# Patient Record
Sex: Female | Born: 1960
Health system: Southern US, Community
[De-identification: ages and names within clinical notes are randomized; demographics above are authoritative.]

## PROBLEM LIST (undated history)

## (undated) ENCOUNTER — Ambulatory Visit

## (undated) DIAGNOSIS — E079 Disorder of thyroid, unspecified: Secondary | ICD-10-CM

## (undated) DIAGNOSIS — F419 Anxiety disorder, unspecified: Secondary | ICD-10-CM

## (undated) DIAGNOSIS — I10 Essential (primary) hypertension: Secondary | ICD-10-CM

## (undated) DIAGNOSIS — C801 Malignant (primary) neoplasm, unspecified: Secondary | ICD-10-CM

## (undated) DIAGNOSIS — E785 Hyperlipidemia, unspecified: Secondary | ICD-10-CM

## (undated) DIAGNOSIS — E119 Type 2 diabetes mellitus without complications: Secondary | ICD-10-CM

## (undated) DIAGNOSIS — F32A Depression, unspecified: Secondary | ICD-10-CM

## (undated) HISTORY — PX: INCISE AND DRAIN ABCESS: PRO64

## (undated) HISTORY — PX: ABDOMINAL HYSTERECTOMY: SHX81

## (undated) HISTORY — DX: Hyperlipidemia, unspecified: E78.5

## (undated) HISTORY — PX: CHOLECYSTECTOMY: SHX55

## (undated) HISTORY — DX: Depression, unspecified: F32.A

## (undated) HISTORY — DX: Malignant (primary) neoplasm, unspecified: C80.1

## (undated) HISTORY — PX: THYROIDECTOMY: SHX17

## (undated) HISTORY — DX: Anxiety disorder, unspecified: F41.9

## (undated) HISTORY — PX: BREAST CYST EXCISION: SHX579

---

## 1998-08-12 ENCOUNTER — Emergency Department (HOSPITAL_COMMUNITY): Admission: EM | Admit: 1998-08-12 | Discharge: 1998-08-12 | Payer: Self-pay | Admitting: Emergency Medicine

## 1998-10-03 ENCOUNTER — Ambulatory Visit (HOSPITAL_COMMUNITY): Admission: RE | Admit: 1998-10-03 | Discharge: 1998-10-03 | Payer: Self-pay | Admitting: Rehabilitation

## 1999-01-02 ENCOUNTER — Encounter: Payer: Self-pay | Admitting: Emergency Medicine

## 1999-01-02 ENCOUNTER — Emergency Department (HOSPITAL_COMMUNITY): Admission: EM | Admit: 1999-01-02 | Discharge: 1999-01-02 | Payer: Self-pay | Admitting: Emergency Medicine

## 1999-09-07 ENCOUNTER — Other Ambulatory Visit: Admission: RE | Admit: 1999-09-07 | Discharge: 1999-09-07 | Payer: Self-pay | Admitting: *Deleted

## 2000-11-14 ENCOUNTER — Other Ambulatory Visit: Admission: RE | Admit: 2000-11-14 | Discharge: 2000-11-14 | Payer: Self-pay | Admitting: *Deleted

## 2001-01-13 ENCOUNTER — Encounter: Admission: RE | Admit: 2001-01-13 | Discharge: 2001-01-13 | Payer: Self-pay | Admitting: Family Medicine

## 2001-01-13 ENCOUNTER — Encounter: Payer: Self-pay | Admitting: Orthopedic Surgery

## 2001-02-02 ENCOUNTER — Encounter (HOSPITAL_BASED_OUTPATIENT_CLINIC_OR_DEPARTMENT_OTHER): Payer: Self-pay | Admitting: General Surgery

## 2001-02-03 ENCOUNTER — Ambulatory Visit (HOSPITAL_COMMUNITY): Admission: RE | Admit: 2001-02-03 | Discharge: 2001-02-04 | Payer: Self-pay | Admitting: General Surgery

## 2001-02-03 ENCOUNTER — Encounter (HOSPITAL_BASED_OUTPATIENT_CLINIC_OR_DEPARTMENT_OTHER): Payer: Self-pay | Admitting: General Surgery

## 2001-02-03 ENCOUNTER — Encounter (INDEPENDENT_AMBULATORY_CARE_PROVIDER_SITE_OTHER): Payer: Self-pay | Admitting: Specialist

## 2001-03-29 ENCOUNTER — Encounter: Payer: Self-pay | Admitting: Orthopedic Surgery

## 2001-03-29 ENCOUNTER — Encounter: Admission: RE | Admit: 2001-03-29 | Discharge: 2001-03-29 | Payer: Self-pay | Admitting: Orthopedic Surgery

## 2001-05-08 ENCOUNTER — Emergency Department (HOSPITAL_COMMUNITY): Admission: EM | Admit: 2001-05-08 | Discharge: 2001-05-08 | Payer: Self-pay | Admitting: Emergency Medicine

## 2002-02-28 ENCOUNTER — Other Ambulatory Visit: Admission: RE | Admit: 2002-02-28 | Discharge: 2002-02-28 | Payer: Self-pay | Admitting: *Deleted

## 2002-05-01 ENCOUNTER — Encounter: Payer: Self-pay | Admitting: Emergency Medicine

## 2002-05-01 ENCOUNTER — Emergency Department (HOSPITAL_COMMUNITY): Admission: EM | Admit: 2002-05-01 | Discharge: 2002-05-01 | Payer: Self-pay | Admitting: Emergency Medicine

## 2003-03-13 ENCOUNTER — Ambulatory Visit (HOSPITAL_COMMUNITY): Admission: RE | Admit: 2003-03-13 | Discharge: 2003-03-13 | Payer: Self-pay | Admitting: Family Medicine

## 2004-02-03 ENCOUNTER — Other Ambulatory Visit: Admission: RE | Admit: 2004-02-03 | Discharge: 2004-02-03 | Payer: Self-pay | Admitting: Diagnostic Radiology

## 2004-03-02 ENCOUNTER — Encounter (HOSPITAL_COMMUNITY): Admission: RE | Admit: 2004-03-02 | Discharge: 2004-05-31 | Payer: Self-pay | Admitting: General Surgery

## 2004-06-10 ENCOUNTER — Ambulatory Visit (HOSPITAL_COMMUNITY): Admission: RE | Admit: 2004-06-10 | Discharge: 2004-06-10 | Payer: Self-pay | Admitting: General Surgery

## 2004-06-15 ENCOUNTER — Encounter (INDEPENDENT_AMBULATORY_CARE_PROVIDER_SITE_OTHER): Payer: Self-pay | Admitting: *Deleted

## 2004-06-15 ENCOUNTER — Observation Stay (HOSPITAL_COMMUNITY): Admission: RE | Admit: 2004-06-15 | Discharge: 2004-06-16 | Payer: Self-pay | Admitting: General Surgery

## 2004-07-20 ENCOUNTER — Encounter (HOSPITAL_COMMUNITY): Admission: RE | Admit: 2004-07-20 | Discharge: 2004-10-18 | Payer: Self-pay | Admitting: Endocrinology

## 2004-11-26 ENCOUNTER — Emergency Department (HOSPITAL_COMMUNITY): Admission: EM | Admit: 2004-11-26 | Discharge: 2004-11-26 | Payer: Self-pay | Admitting: Emergency Medicine

## 2005-09-08 ENCOUNTER — Encounter: Admission: RE | Admit: 2005-09-08 | Discharge: 2005-09-08 | Payer: Self-pay | Admitting: Endocrinology

## 2006-08-26 ENCOUNTER — Emergency Department (HOSPITAL_COMMUNITY): Admission: EM | Admit: 2006-08-26 | Discharge: 2006-08-26 | Payer: Self-pay | Admitting: Emergency Medicine

## 2007-01-02 ENCOUNTER — Encounter (HOSPITAL_COMMUNITY): Admission: RE | Admit: 2007-01-02 | Discharge: 2007-01-19 | Payer: Self-pay | Admitting: Endocrinology

## 2008-12-27 ENCOUNTER — Emergency Department (HOSPITAL_COMMUNITY): Admission: EM | Admit: 2008-12-27 | Discharge: 2008-12-27 | Payer: Self-pay | Admitting: Emergency Medicine

## 2009-03-28 ENCOUNTER — Emergency Department (HOSPITAL_COMMUNITY): Admission: EM | Admit: 2009-03-28 | Discharge: 2009-03-28 | Payer: Self-pay | Admitting: Family Medicine

## 2010-02-01 ENCOUNTER — Encounter: Payer: Self-pay | Admitting: Endocrinology

## 2010-02-01 ENCOUNTER — Encounter: Payer: Self-pay | Admitting: General Surgery

## 2010-02-01 ENCOUNTER — Encounter: Payer: Self-pay | Admitting: Family Medicine

## 2010-05-29 NOTE — Op Note (Signed)
Cynthia Patton, Cynthia Patton                ACCOUNT NO.:  [id number]   MEDICAL RECORD NO.:  [id number]          PATIENT TYPE:  AMB   LOCATION:  DAY                          FACILITY:  [facility]   PHYSICIAN:  Angelia Mould. Derrell Lolling, M.D.DATE OF BIRTH:  05-11-1960   DATE OF PROCEDURE:  06/15/2004  DATE OF DISCHARGE:                                 OPERATIVE REPORT   PREOPERATIVE DIAGNOSES:  1.  Primary hyperparathyroidism.  2.  Right thyroid nodule.   POSTOPERATIVE DIAGNOSES:  1.  Primary hyperparathyroidism (adenoma, left inferior).  2.  Papillary carcinoma of the thyroid gland.   OPERATION PERFORMED:  Total thyroidectomy with frozen section, resection of  parathyroid adenoma disease, left inferior position).   SURGEON:  Angelia Mould. Derrell Lolling, M.D.   FIRST ASSISTANT:  Velora Heckler, M.D.   OPERATIVE INDICATIONS:  This is a 50 year old black female who has a  multinodular goiter and has been evaluated for the goiter as well as  hypercalcemia. She has had calcium levels as high as 11.7 and parathormone  levels as high as 178, but this has been fluctuating depending on vitamin D  therapy. A sestamibi scan x2 has been non-localizing. A thyroid ultrasound  shows a 1.4-cm nodule in the right lobe and suggests a parathyroid adenoma 8  mm in size in the left inferior position. Fine needle aspiration cytology of  the right thyroid nodule showed atypical features, and excision was  recommended. On exam, she is obese with a short, thick neck. No palpable  mass or adenopathy is noted. She has been counseled regarding the surgery  and its risks as an outpatient. She is brought to the operating room  electively.   OPERATIVE FINDINGS:  The patient had about an 8-9 mm parathyroid adenoma in  the left inferior position. This was relatively anterior and just below the  level of the inferior thyroid artery. She had a small hard nodule in the  right thyroid lobe just to the right of the isthmus, and on frozen  section,  this proved to be a papillary carcinoma. There was no adenopathy in the  neck. There was a small fatty nodule in the suprasternal notch which was  sent as a separate specimen. The recurrent laryngeal nerves were seen on  both sides and preserved. We got a good look at the superior parathyroid on  the left. The inferior parathyroid on the left was removed as the adenoma.  On the right side, there looked like there may have been a tiny parathyroid  which was preserved and left with the superior thyroid vessels. There was  some fatty tissue near the inferior thyroid artery on the right, but I never  completely confirmed that we preserved the right inferior parathyroid. There  was no adenopathy otherwise.   OPERATIVE TECHNIQUE:  Following the induction of general endotracheal  anesthesia, the patient was positioned with her arms at her side and her  neck extended with a roll behind her shoulders. The neck was prepped and  draped in a sterile fashion. The patient was identified. A curved transverse  incision was made about  2 cm above the suprasternal notch. Dissection was  carried down through the platysma muscle with electrocautery. Skin and  platysma flaps were raised superiorly and inferiorly and a self-retaining  retractor placed. Strap muscles were divided in the midline and using blunt  dissection, sharp dissection, and electrocautery, the strap muscles were  dissected off the thyroid gland on each side.   We noted the palpable nodule in the right thyroid gland that was about 1-1.5  cm in diameter relatively anteriorly. I did not feel any nodules in the left  thyroid gland. We identified what appeared to be a parathyroid adenoma on  the left inferior position. This was just below and slightly anterior to the  inferior thyroid artery. This had a nice dissection plane, and it was  dissected off the thyroid gland without any difficulty. This tissue was sent  to the lab and Dr.  Debby Bud performed frozen section and confirmed that this  was parathyroid tissue. We felt that this was consistent with her  parathyroid adenoma.   We then performed a right thyroid lobectomy. The superior thyroid vessels  were isolated, ligated in continuity with 2-0 silk ties, and a metal clip  placed superiorly for security, and then divided. We mobilized the inferior  pole on the right. A few vascular channels were controlled. We then  identified the recurrent laryngeal nerve on the right and kept the  dissection in the capsule of the thyroid gland staying well away from the  recurrent laryngeal nerve, mobilizing the thyroid gland. The inferior  thyroid artery was isolated, secured with small metal clips, and divided.  The middle thyroid vein was likewise secured with multiple metal clips and  divided. We were then able to mobilize the thyroid gland up and off the  anterior surface of the trachea and all the way over to the left side of the  isthmus. The isthmus was clamped, and the tissues were divided. The isthmus  was tied off with a Vicryl tie.   Frozen section returned showing papillary carcinoma. Given the size of the  tumor, we felt that it would be appropriate  to do a completion  thyroidectomy. We then performed a left thyroid lobectomy. We isolated the  superior thyroid vessels on the left. Parathyroid tissue left was identified  and was dissected off and preserved. The superior thyroid vessels were  ligated in continuity with 2-0 silk ties, and a metal clip was placed above  the superior tie for security. We mobilized the upper pole further by  dissecting it off the airway. The inferior pole of the thyroid was mobilized  by dividing a couple of venous channels. We had good visualization of the  recurrent laryngeal nerve on the left, and it was easy to preserve. The middle thyroid vein and the inferior thyroid artery were isolated, secured  with small metal clips, and  divided. We then were able to dissect the  thyroid gland off with sharp dissection being careful to avoid any cautery  near the nerve. The left thyroid lobe was then completely dissected off the  airway and sent for routine histology.   The entire wound was irrigated with saline. Hemostasis was excellent. Some  Surgicel gauze were placed in the thyroid bed on each side. Then, we waited  about 5-10 minutes and after that period of time, the wound was completely  dry. The strap muscles were closed in the midline with interrupted sutures  of 3-0 Vicryl. The platysma muscle was closed with  interrupted sutures of 3-  0 Vicryl. The skin was closed with some skin staples and Steri-  Strips in between. A clean bandage was placed and the patient taken to the  recovery room in stable condition. Estimated blood loss was about 30-40 cc.  Complications:  None.  Sponge, needle, and instrument counts were correct.       HMI/MEDQ  D:  06/15/2004  T:  06/15/2004  Job:  [id number]   cc:   Dorisann Frames, M.D.  [place] [address], [place] [address]  Fax: [phone]   Holley Bouche, M.D.  [address]  [place], [place] [address]  Fax: [phone]

## 2010-05-29 NOTE — Op Note (Signed)
[facility]  Patient:    LIBBEY, DUCE Visit Number: [id number] MRN: [id number]          Service Type: DSU Location: [id number] Attending Physician:  Sonda Primes Dictated by:   Mardene Celeste. Lurene Shadow, M.D. Proc. 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  PROCEDURE:  Laparoscopic cholecystectomy with intraoperative cholangiogram.  SURGEON:  Luisa Hart L. Lurene Shadow, M.D.  ASSISTANT:  Marnee Spring. Wiliam Ke, M.D.  ANESTHESIA:  General.  INDICATIONS:  The patient is a 50 year old woman presenting with a calcified mass in the right upper quadrant on routine hip x-rays.  Subsequent ultrasound demonstrates what is felt to be a porcelain gallbladder.  She is brought to the operating room now for a laparoscopic cholecystectomy.  DESCRIPTION OF PROCEDURE:  Following the induction of satisfactory anesthesia with the patient positioned supinely, the abdomen was routinely prepped and draped to be included in the sterile operative field.  Open laparoscopy was created at the umbilicus and insertion of a Hasson cannula, and insufflation of the peritoneal cavity to 14 mmHg pressure.  The camera was inserted and the abdomen explored.  The gallbladder does seem to be partially calcified, but it is relatively mobile.  The liver edges were sharp and liver surfaces smooth. The anterior gastric wall, duodenum, and ________ normal.  None of the small or large intestine appeared to be abnormal.  Multiple adhesions in the lower abdomen from previous surgery.  Under direct vision, epigastric and lateral ports were placed.  The gallbladder was grasped and retracted cephalad.  The dissection was carried down  and the region of the ampulla of the gallbladder and along the ______ duodenal ligament with isolation of the cystic artery and cystic duct.  The cystic duct is doubly clipped and opened, and a cystic duct cholangiogram carried out with one-half strength Hypaque injected into the cystic duct through a Reddick catheter which was inserted into the abdomen through a 14-gauge angiocath.  The resultant cholangiogram showed free flow of contrast into the duodenum.  Upper and lower radicles appeared to be normal.  No filling defects are noted.  The cholangiocatheter was removed.  The cystic duct is doubly clipped and then transected, and the cystic artery doubly clipped and transected.  The gallbladder dissected free from the liver bed, maintaining hemostasis with electrocautery throughout the course of the dissection.  The gallbladder was then removed from the abdomen using an Endocatch and the right upper quadrant thoroughly irrigated with normal saline and aspirated.  Sponge, instrument, and sharp counts were verified.  The trocars were removed under direct vision and pneumoperitoneum allowed to deflate.  The wounds were then closed in layers as follows; the umbilical wound in two layers with 0 Dexon and 4-0 Dexon.  Epigastric and lateral flank wounds were closed with 4-0 Dexon sutures and reinforced with Steri-Strips and sterile dressings applied.  The anesthetic was reversed.  The patient was removed from the operating room to the recovery room in stable condition.  She tolerated the procedure well. Dictated by:   Mardene Celeste. Lurene Shadow, M.D. Attending Physician:  Sonda Primes DD:  02/03/01 TD:  02/04/01 Job: [id number]

## 2010-07-09 ENCOUNTER — Emergency Department (HOSPITAL_COMMUNITY): Payer: Self-pay

## 2010-07-09 ENCOUNTER — Emergency Department (HOSPITAL_COMMUNITY)
Admission: EM | Admit: 2010-07-09 | Discharge: 2010-07-09 | Disposition: A | Discharge status: Home / Self Care | Payer: Self-pay | Attending: Emergency Medicine | Admitting: Emergency Medicine

## 2010-07-09 DIAGNOSIS — M79609 Pain in unspecified limb: Secondary | ICD-10-CM | POA: Insufficient documentation

## 2010-07-09 DIAGNOSIS — R5383 Other fatigue: Secondary | ICD-10-CM | POA: Insufficient documentation

## 2010-07-09 DIAGNOSIS — I10 Essential (primary) hypertension: Secondary | ICD-10-CM | POA: Insufficient documentation

## 2010-07-09 DIAGNOSIS — R079 Chest pain, unspecified: Secondary | ICD-10-CM | POA: Insufficient documentation

## 2010-07-09 DIAGNOSIS — M810 Age-related osteoporosis without current pathological fracture: Secondary | ICD-10-CM | POA: Insufficient documentation

## 2010-07-09 DIAGNOSIS — Z79899 Other long term (current) drug therapy: Secondary | ICD-10-CM | POA: Insufficient documentation

## 2010-07-09 DIAGNOSIS — R0609 Other forms of dyspnea: Secondary | ICD-10-CM | POA: Insufficient documentation

## 2010-07-09 DIAGNOSIS — R110 Nausea: Secondary | ICD-10-CM | POA: Insufficient documentation

## 2010-07-09 DIAGNOSIS — Z85850 Personal history of malignant neoplasm of thyroid: Secondary | ICD-10-CM | POA: Insufficient documentation

## 2010-07-09 DIAGNOSIS — R5381 Other malaise: Secondary | ICD-10-CM | POA: Insufficient documentation

## 2010-07-09 DIAGNOSIS — R0989 Other specified symptoms and signs involving the circulatory and respiratory systems: Secondary | ICD-10-CM | POA: Insufficient documentation

## 2010-07-09 DIAGNOSIS — R42 Dizziness and giddiness: Secondary | ICD-10-CM | POA: Insufficient documentation

## 2010-07-09 LAB — CBC
HCT: 39 % (ref 36.0–46.0)
MCHC: 33.3 g/dL (ref 30.0–36.0)
RDW: 12.7 % (ref 11.5–15.5)

## 2010-07-09 LAB — CK TOTAL AND CKMB (NOT AT ARMC): Total CK: 199 U/L — ABNORMAL HIGH (ref 7–177)

## 2010-07-09 LAB — GLUCOSE, CAPILLARY: Glucose-Capillary: 112 mg/dL — ABNORMAL HIGH (ref 70–99)

## 2010-07-09 LAB — URINALYSIS, ROUTINE W REFLEX MICROSCOPIC
Glucose, UA: NEGATIVE mg/dL
Protein, ur: NEGATIVE mg/dL
Specific Gravity, Urine: 1.016 (ref 1.005–1.030)
pH: 5.5 (ref 5.0–8.0)

## 2010-07-09 LAB — DIFFERENTIAL
Basophils Absolute: 0 10*3/uL (ref 0.0–0.1)
Basophils Relative: 0 % (ref 0–1)
Eosinophils Relative: 2 % (ref 0–5)
Monocytes Absolute: 0.4 10*3/uL (ref 0.1–1.0)

## 2010-07-09 LAB — D-DIMER, QUANTITATIVE: D-Dimer, Quant: 0.47 ug/mL-FEU (ref 0.00–0.48)

## 2010-07-09 LAB — BASIC METABOLIC PANEL
CO2: 28 mEq/L (ref 19–32)
Calcium: 8.4 mg/dL (ref 8.4–10.5)
Chloride: 104 mEq/L (ref 96–112)
Glucose, Bld: 112 mg/dL — ABNORMAL HIGH (ref 70–99)
Sodium: 141 mEq/L (ref 135–145)

## 2010-07-09 LAB — URINE MICROSCOPIC-ADD ON

## 2010-10-23 LAB — BASIC METABOLIC PANEL
BUN: 9
CO2: 28
Chloride: 104
Creatinine, Ser: 1.26 — ABNORMAL HIGH
Glucose, Bld: 148 — ABNORMAL HIGH
Potassium: 3.6

## 2010-10-23 LAB — URINALYSIS, ROUTINE W REFLEX MICROSCOPIC
Bilirubin Urine: NEGATIVE
Ketones, ur: NEGATIVE
Leukocytes, UA: NEGATIVE
Nitrite: NEGATIVE
Urobilinogen, UA: 0.2

## 2010-10-23 LAB — TSH: TSH: 115.687 — ABNORMAL HIGH

## 2010-10-23 LAB — POCT CARDIAC MARKERS: Troponin i, poc: <0.05

## 2011-03-28 ENCOUNTER — Emergency Department (HOSPITAL_COMMUNITY)
Admission: EM | Admit: 2011-03-28 | Discharge: 2011-03-28 | Disposition: A | Discharge status: Home / Self Care | Payer: Self-pay | Attending: Emergency Medicine | Admitting: Emergency Medicine

## 2011-03-28 ENCOUNTER — Emergency Department (HOSPITAL_COMMUNITY): Payer: Self-pay

## 2011-03-28 ENCOUNTER — Encounter (HOSPITAL_COMMUNITY): Payer: Self-pay

## 2011-03-28 DIAGNOSIS — R05 Cough: Secondary | ICD-10-CM | POA: Insufficient documentation

## 2011-03-28 DIAGNOSIS — R911 Solitary pulmonary nodule: Secondary | ICD-10-CM | POA: Insufficient documentation

## 2011-03-28 DIAGNOSIS — R059 Cough, unspecified: Secondary | ICD-10-CM | POA: Insufficient documentation

## 2011-03-28 DIAGNOSIS — F17200 Nicotine dependence, unspecified, uncomplicated: Secondary | ICD-10-CM | POA: Insufficient documentation

## 2011-03-28 DIAGNOSIS — R0789 Other chest pain: Secondary | ICD-10-CM | POA: Insufficient documentation

## 2011-03-28 DIAGNOSIS — J40 Bronchitis, not specified as acute or chronic: Secondary | ICD-10-CM | POA: Insufficient documentation

## 2011-03-28 DIAGNOSIS — I10 Essential (primary) hypertension: Secondary | ICD-10-CM | POA: Insufficient documentation

## 2011-03-28 DIAGNOSIS — E079 Disorder of thyroid, unspecified: Secondary | ICD-10-CM | POA: Insufficient documentation

## 2011-03-28 HISTORY — DX: Essential (primary) hypertension: I10

## 2011-03-28 HISTORY — DX: Disorder of thyroid, unspecified: E07.9

## 2011-03-28 MED ORDER — OXYCODONE-ACETAMINOPHEN 5-325 MG PO TABS
1.0000 | ORAL_TABLET | Freq: Once | ORAL | Status: AC
  Administered 2011-03-28: 1 via ORAL
  Filled 2011-03-28: qty 1

## 2011-03-28 MED ORDER — OXYCODONE-ACETAMINOPHEN 5-325 MG PO TABS: 2.0000 | ORAL_TABLET | ORAL | Status: AC | PRN

## 2011-03-28 MED ORDER — BENZONATATE 100 MG PO CAPS: 100.0000 mg | ORAL_CAPSULE | Freq: Three times a day (TID) | ORAL | Status: AC

## 2011-03-28 NOTE — ED Notes (Signed)
Pt became sick in September, and feels that she has never gotten better.  Today it has worsened. Pt complains of chest pain and cough. Cough is productive with yellow-green sputum.  Pt felt out of breath earlier. Pt has generalized weakness, chills.

## 2011-03-28 NOTE — ED Provider Notes (Addendum)
History     CSN: [id number]  Arrival date & time 03/28/11  1207   First MD Initiated Contact with Patient 03/28/11 1432      Chief Complaint  Patient presents with  . Chest Pain  . Cough    (Consider location/radiation/quality/duration/timing/severity/associated sxs/prior treatment) Patient is a 51 y.o. female presenting with chest pain and cough. The history is provided by the patient.  Chest Pain Primary symptoms include a fever and cough. Pertinent negatives for primary symptoms include no shortness of breath, no palpitations, no abdominal pain, no nausea and no vomiting.    Cough Associated symptoms include chest pain. Pertinent negatives include no chills, no headaches and no shortness of breath.   the patient is a 51 year old, female, with hypertension.  She smokes cigarettes.  She complains of a nonproductive cough since last September.  She does not have shortness of breath ,fevers, chills, nausea, vomiting, sweating, pain or swelling.  Today.  She was at church singing, and she developed tightness in her chest that lasted about 3 minutes and then went away.  Now.  She has soreness in her chest.  She says.  She has not fallen recently.  She states when she goes up and down stairs.  She does not get chest pain.  She denies diabetes, prior heart disease or family history of heart disease.  Presently, she says she has soreness in her chest without radiation.  She did not take anything for her pain  Past Medical History  Diagnosis Date  . Hypertension   . Thyroid disease     Past Surgical History  Procedure Date  . Cholecystectomy   . Abdominal hysterectomy     No family history on file.  History  Substance Use Topics  . Smoking status: Not on file  . Smokeless tobacco: Not on file  . Alcohol Use: No    OB History    Grav Para Term Preterm Abortions TAB SAB Ect Mult Living                  Review of Systems  Constitutional: Positive for fever. Negative for  chills.  Respiratory: Positive for cough and chest tightness. Negative for shortness of breath.        Chest tightness lasted 3 minutes while she was singing and has resolved.  It was not associated with any other symptoms  Cardiovascular: Positive for chest pain. Negative for palpitations and leg swelling.       3 minutes of chest tightness while singing resolved now.  She says that her chest is sore now  Gastrointestinal: Negative for nausea, vomiting and abdominal pain.  Neurological: Negative for headaches.  Psychiatric/Behavioral: Negative for confusion.  All other systems reviewed and are negative.    Allergies  Floxin  Home Medications   Current Outpatient Rx  Name Route Sig Dispense Refill  . CETIRIZINE HCL 10 MG PO TABS Oral Take 10 mg by mouth daily.    Marland Kitchen HYDROCHLOROTHIAZIDE 12.5 MG PO TABS Oral Take 12.5 mg by mouth daily.    Marland Kitchen LEVOTHYROXINE SODIUM 200 MCG PO TABS Oral Take 200 mcg by mouth daily.    Marland Kitchen BENZONATATE 100 MG PO CAPS Oral Take 1 capsule (100 mg total) by mouth every 8 (eight) hours. 21 capsule 0  . OXYCODONE-ACETAMINOPHEN 5-325 MG PO TABS Oral Take 2 tablets by mouth every 4 (four) hours as needed for pain. 6 tablet 0    BP 168/94  Pulse 97  Temp(Src) 98.5 F (  36.9 C) (Oral)  Resp 22  SpO2 97%  Physical Exam  Vitals reviewed. Constitutional: She is oriented to person, place, and time. No distress.       Obese  HENT:  Head: Normocephalic and atraumatic.  Eyes: Pupils are equal, round, and reactive to light.  Neck: Normal range of motion.  Cardiovascular: Normal rate, regular rhythm and normal heart sounds.   No murmur heard. Pulmonary/Chest: Effort normal and breath sounds normal. No respiratory distress. She has no wheezes. She has no rales. She exhibits tenderness.       Left parasternal chest wall tenderness  Abdominal: Soft. She exhibits no distension and no mass. There is no tenderness. There is no rebound and no guarding.  Musculoskeletal:  Normal range of motion. She exhibits no edema and no tenderness.  Neurological: She is alert and oriented to person, place, and time. No cranial nerve deficit.  Skin: Skin is warm and dry. No rash noted. No erythema.  Psychiatric: She has a normal mood and affect. Her behavior is normal.    ED Course  Procedures (including critical care time) 51 year old, female, with chronic cough since last September presents to emergency department with chest wall soreness.  She denies shortness of breath, fevers, chills, nausea, vomiting, or sweating.  She has left parasternal tenderness, but her lungs are clear and she is in no distress.  We will perform a chest x-ray.  Given the fact that she had 3 minutes of chest tightness while singing and she does not get chest pain or ACS.  Symptoms.  When she goes upstairs.  I do not think she is having in acute coronary syndrome.  Labs Reviewed - No data to display Dg Chest 2 View  03/28/2011  *RADIOLOGY REPORT*  Clinical Data: Chest pain, cough  CHEST - 2 VIEW  Comparison: 07/09/2010  Findings: Stable borderline cardiomegaly but normal vascularity. Negative for pneumonia, collapse, consolidation, edema, effusion or pneumothorax.  Possible tiny punctate right mid lung granuloma versus prominent vascular marking.  On the lateral view, there is a 3 cm round opacity projecting over the posterior aspect of the heart.  By comparison, this appears slightly enlarged.  Adenopathy or nodule not excluded.  Recommend further evaluation with non emergent chest CT with contrast.  IMPRESSION: No acute chest process.  3 cm nodular opacity on the lateral view as described, indeterminate.  Please see above comment and recommendation.  Original Report Authenticated By: Judie Petit. Ruel Favors, M.D.     1. Bronchitis       MDM  Bronchitis with left parasternal tenderness No pneumonia.  No signs of respiratory distress or hypoxia 3 cm left hemithorax nodule.  The patient is a smoker.  I  advised her of the results of her chest x-ray, and the need for followup.  CAT scan for further evaluation of the nodule.  She understands these instructions.        Cheri Guppy, MD 03/28/11 1518  Cheri Guppy, MD 03/28/11 [phone]

## 2011-03-28 NOTE — Discharge Instructions (Signed)
Your chest x-ray does not show pneumonia.  However, there is a 3 cm nodule on the left side of your chest.  This is unlikely to be the cause of your pain.  However, it should be reevaluated with a CAT scan.  Stop smoking.  Cigarettes.  Use Tessalon for cough, and ibuprofen 600 mg every 6 hours for pain.  Percocet for more severe pain.  Followup in 3 months to schedule a CAT scan for evaluation of the nodule in your lungs.  Return for worse or uncontrolled symptoms

## 2011-03-28 NOTE — ED Notes (Signed)
[id number]<GN> Expected date:03/28/11<BR> Expected time:<BR> Means of arrival:<BR> Comments:<BR> EMS 100 GC - sob/chestwall pain/cough

## 2011-04-30 ENCOUNTER — Emergency Department (HOSPITAL_COMMUNITY)
Admission: EM | Admit: 2011-04-30 | Discharge: 2011-04-30 | Disposition: A | Discharge status: Home / Self Care | Payer: Self-pay | Attending: Emergency Medicine | Admitting: Emergency Medicine

## 2011-04-30 ENCOUNTER — Emergency Department (HOSPITAL_COMMUNITY): Payer: Self-pay

## 2011-04-30 ENCOUNTER — Encounter (HOSPITAL_COMMUNITY): Payer: Self-pay | Admitting: Physical Medicine and Rehabilitation

## 2011-04-30 DIAGNOSIS — E079 Disorder of thyroid, unspecified: Secondary | ICD-10-CM | POA: Insufficient documentation

## 2011-04-30 DIAGNOSIS — J40 Bronchitis, not specified as acute or chronic: Secondary | ICD-10-CM | POA: Insufficient documentation

## 2011-04-30 DIAGNOSIS — F17200 Nicotine dependence, unspecified, uncomplicated: Secondary | ICD-10-CM | POA: Insufficient documentation

## 2011-04-30 DIAGNOSIS — I10 Essential (primary) hypertension: Secondary | ICD-10-CM | POA: Insufficient documentation

## 2011-04-30 DIAGNOSIS — R079 Chest pain, unspecified: Secondary | ICD-10-CM | POA: Insufficient documentation

## 2011-04-30 LAB — BASIC METABOLIC PANEL
CO2: 28 mEq/L (ref 19–32)
Chloride: 100 mEq/L (ref 96–112)
GFR calc non Af Amer: 72 mL/min — ABNORMAL LOW (ref >90)
Glucose, Bld: 159 mg/dL — ABNORMAL HIGH (ref 70–99)
Potassium: 3.6 mEq/L (ref 3.5–5.1)
Sodium: 138 mEq/L (ref 135–145)

## 2011-04-30 LAB — DIFFERENTIAL
Basophils Absolute: 0 10*3/uL (ref 0.0–0.1)
Lymphocytes Relative: 35 % (ref 12–46)
Lymphs Abs: 2.8 10*3/uL (ref 0.7–4.0)
Monocytes Absolute: 0.1 10*3/uL (ref 0.1–1.0)
Neutro Abs: 4.8 10*3/uL (ref 1.7–7.7)

## 2011-04-30 LAB — CBC
Hemoglobin: 14 g/dL (ref 12.0–15.0)
MCHC: 32.5 g/dL (ref 30.0–36.0)
RBC: 4.54 MIL/uL (ref 3.87–5.11)
WBC: 7.7 10*3/uL (ref 4.0–10.5)

## 2011-04-30 LAB — TROPONIN I: Troponin I: <0.3 ng/mL (ref <0.30)

## 2011-04-30 LAB — POCT I-STAT TROPONIN I

## 2011-04-30 MED ORDER — ALBUTEROL SULFATE HFA 108 (90 BASE) MCG/ACT IN AERS
2.0000 | INHALATION_SPRAY | Freq: Four times a day (QID) | RESPIRATORY_TRACT | Status: DC
  Administered 2011-04-30 (×2): 2 via RESPIRATORY_TRACT
  Filled 2011-04-30: qty 6.7

## 2011-04-30 MED ORDER — KETOROLAC TROMETHAMINE 30 MG/ML IJ SOLN
30.0000 mg | Freq: Once | INTRAMUSCULAR | Status: AC
  Administered 2011-04-30: 30 mg via INTRAVENOUS
  Filled 2011-04-30: qty 1

## 2011-04-30 MED ORDER — LEVOTHYROXINE SODIUM 200 MCG PO TABS: 200.0000 ug | ORAL_TABLET | Freq: Every day | ORAL | Status: DC

## 2011-04-30 MED ORDER — AEROCHAMBER PLUS W/MASK MISC
Status: AC
  Administered 2011-04-30: 13:00:00
  Filled 2011-04-30: qty 1

## 2011-04-30 MED ORDER — IBUPROFEN 800 MG PO TABS: 800.0000 mg | ORAL_TABLET | Freq: Three times a day (TID) | ORAL | Status: AC

## 2011-04-30 NOTE — ED Notes (Signed)
Pt presents to department for evaluation of midsternal chest pain radiating to R arm and neck. Ongoing x1 month. Pt states 8/10 pain upon arrival, becomes worse when lying flat. Also states SOB on exertion. She is alert and oriented x4. Respirations unlabored. Speaking complete sentences. Skin warm and dry.

## 2011-04-30 NOTE — ED Provider Notes (Signed)
History     CSN: [id number]  Arrival date & time 04/30/11  1021   First MD Initiated Contact with Patient 04/30/11 1034      Chief Complaint  Patient presents with  . Chest Pain  . Shortness of Breath    (Consider location/radiation/quality/duration/timing/severity/associated sxs/prior treatment) HPI The patient presents with chest pain, cough and occasional dyspnea.  She notes her symptoms began months ago.  After her symptoms were present for some time, she presented to our affiliated hospital.  She was evaluated with chest x-ray, ECG, diagnosed with bronchitis and discharged to follow up with the primary care physician.  She has not done so thus far.  Patient presents with concerns over the persistency of her chest pain.  The pain is described as substernal with radiation around the left inframammary area.  The pain is not positional, exertional, or pleuritic.  She notes that she also continues to have mild cough.  She denies significant dyspnea, but does note that she has decreased exercise tolerance.  relief with anything.  The pain is worse with supine positioning.  No lower extremity edema. The patient was scheduled to follow up at outpatient clinic today, in 2 hours.  Although her pain has been present for months, she became anxious at the wait and presents here for evaluation. Past Medical History  Diagnosis Date  . Hypertension   . Thyroid disease     Past Surgical History  Procedure Date  . Cholecystectomy   . Abdominal hysterectomy     No family history on file.  History  Substance Use Topics  . Smoking status: Current Everyday Smoker -- 0.5 packs/day    Types: Cigarettes  . Smokeless tobacco: Not on file  . Alcohol Use: No    OB History    Grav Para Term Preterm Abortions TAB SAB Ect Mult Living                  Review of Systems  Constitutional:       HPI  HENT:       HPI otherwise negative  Eyes: Negative.   Respiratory:       HPI, otherwise  negative  Cardiovascular:       HPI, otherwise nmegative  Gastrointestinal: Negative for vomiting.  Genitourinary:       HPI, otherwise negative  Musculoskeletal:       HPI, otherwise negative  Skin: Negative.   Neurological: Negative for syncope.    Allergies  Floxin  Home Medications   Current Outpatient Rx  Name Route Sig Dispense Refill  . FEXOFENADINE HCL 180 MG PO TABS Oral Take 180 mg by mouth daily.    Marland Kitchen HYDROCHLOROTHIAZIDE 12.5 MG PO TABS Oral Take 12.5 mg by mouth daily.    Marland Kitchen LEVOTHYROXINE SODIUM 200 MCG PO TABS Oral Take 200 mcg by mouth daily.      BP 183/87  Pulse 87  Temp(Src) 98.2 F (36.8 C) (Oral)  Resp 18  SpO2 100%  Physical Exam  Nursing note and vitals reviewed. Constitutional: She is oriented to person, place, and time. She appears well-developed and well-nourished. No distress.  HENT:  Head: Normocephalic and atraumatic.  Eyes: Conjunctivae and EOM are normal.  Cardiovascular: Normal rate and regular rhythm.   Pulmonary/Chest: Effort normal and breath sounds normal. No stridor. No respiratory distress.  Abdominal: She exhibits no distension.  Musculoskeletal: She exhibits no edema and no tenderness.  Neurological: She is alert and oriented to person, place, and time. No  cranial nerve deficit.  Skin: Skin is warm and dry.  Psychiatric: She has a normal mood and affect.    ED Course  Procedures (including critical care time)   Labs Reviewed  CBC  BASIC METABOLIC PANEL  DIFFERENTIAL  TROPONIN I   No results found.   No diagnosis found.   Cardiac monitor 91 sinus rhythm normal Pulse oximetry 99% room air normal    Date: 04/30/2011  Rate: 90  Rhythm: normal sinus rhythm  QRS Axis: normal  Intervals: normal  ST/T Wave abnormalities: normal  Conduction Disutrbances: none  Narrative Interpretation: unremarkable  I have reviewed the chest x-ray  12:35 PM Patient sitting upright smiling, speaking clearly MDM  This  well-appearing female presents with months of chest pain, mild cough.  On exam the patient is in no distress.  I spoke with her at length about the need to stop smoking.  Given the chronicity of her pain from her nonischemic EKG to the unremarkable chest x-ray and labs, the patient's discomfort is most likely due to musculoskeletal or bronchitic etiology.  Discussed with patient.  She will be discharged with an albuterol inhaler, anti-inflammatories, instruction to return to clinic for followup.  The patient's request thyroid medication.  This was provided for one week.     Gerhard Munch, MD 04/30/11 1236

## 2011-04-30 NOTE — Progress Notes (Signed)
Met with patient to discuss her health care practice. She denies a PCP and health insurance, living with her mother and lost her job 3 years ago. 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 Agreed to participate with [facility] program and stated will contact the community liaison. Patient provided with the [employer] packet.

## 2011-04-30 NOTE — Discharge Instructions (Signed)
Bronchitis Bronchitis is the body's way of reacting to injury and/or infection (inflammation) of the bronchi. Bronchi are the air tubes that extend from the windpipe into the lungs. If the inflammation becomes severe, it may cause shortness of breath. CAUSES  Inflammation may be caused by:  A virus.   Germs (bacteria).   Dust.   Allergens.   Pollutants and many other irritants.  The cells lining the bronchial tree are covered with tiny hairs (cilia). These constantly beat upward, away from the lungs, toward the mouth. This keeps the lungs free of pollutants. When these cells become too irritated and are unable to do their job, mucus begins to develop. This causes the characteristic cough of bronchitis. The cough clears the lungs when the cilia are unable to do their job. Without either of these protective mechanisms, the mucus would settle in the lungs. Then you would develop pneumonia. Smoking is a common cause of bronchitis and can contribute to pneumonia. Stopping this habit is the single most important thing you can do to help yourself. TREATMENT   Your caregiver may prescribe an antibiotic if the cough is caused by bacteria. Also, medicines that open up your airways make it easier to breathe. Your caregiver may also recommend or prescribe an expectorant. It will loosen the mucus to be coughed up. Only take over-the-counter or prescription medicines for pain, discomfort, or fever as directed by your caregiver.   Removing whatever causes the problem (smoking, for example) is critical to preventing the problem from getting worse.   Cough suppressants may be prescribed for relief of cough symptoms.   Inhaled medicines may be prescribed to help with symptoms now and to help prevent problems from returning.   For those with recurrent (chronic) bronchitis, there may be a need for steroid medicines.  SEEK IMMEDIATE MEDICAL CARE IF:   During treatment, you develop more pus-like mucus  (purulent sputum).   You have a fever.   Your baby is older than 3 months with a rectal temperature of 102 F (38.9 C) or higher.   Your baby is 3 months old or younger with a rectal temperature of 100.4 F (38 C) or higher.   You become progressively more ill.   You have increased difficulty breathing, wheezing, or shortness of breath.  It is necessary to seek immediate medical care if you are elderly or sick from any other disease. MAKE SURE YOU:   Understand these instructions.   Will watch your condition.   Will get help right away if you are not doing well or get worse.  Document Released: 12/28/2004 Document Revised: 12/17/2010 Document Reviewed: 11/07/2007 [employer] Patient Information 2012 [employer].Chest Pain (Nonspecific) It is often hard to give a specific diagnosis for the cause of chest pain. There is always a chance that your pain could be related to something serious, such as a heart attack or a blood clot in the lungs. You need to follow up with your caregiver for further evaluation. CAUSES   Heartburn.   Pneumonia or bronchitis.   Anxiety or stress.   Inflammation around your heart (pericarditis) or lung (pleuritis or pleurisy).   A blood clot in the lung.   A collapsed lung (pneumothorax). It can develop suddenly on its own (spontaneous pneumothorax) or from injury (trauma) to the chest.   Shingles infection (herpes zoster virus).  The chest wall is composed of bones, muscles, and cartilage. Any of these can be the source of the pain.  The bones can   be bruised by injury.   The muscles or cartilage can be strained by coughing or overwork.   The cartilage can be affected by inflammation and become sore (costochondritis).  DIAGNOSIS  Lab tests or other studies, such as X-rays, electrocardiography, stress testing, or cardiac imaging, may be needed to find the cause of your pain.  TREATMENT   Treatment depends on what may be causing your chest pain.  Treatment may include:   Acid blockers for heartburn.   Anti-inflammatory medicine.   Pain medicine for inflammatory conditions.   Antibiotics if an infection is present.   You may be advised to change lifestyle habits. This includes stopping smoking and avoiding alcohol, caffeine, and chocolate.   You may be advised to keep your head raised (elevated) when sleeping. This reduces the chance of acid going backward from your stomach into your esophagus.   Most of the time, nonspecific chest pain will improve within 2 to 3 days with rest and mild pain medicine.  HOME CARE INSTRUCTIONS   If antibiotics were prescribed, take your antibiotics as directed. Finish them even if you start to feel better.   For the next few days, avoid physical activities that bring on chest pain. Continue physical activities as directed.   Do not smoke.   Avoid drinking alcohol.   Only take over-the-counter or prescription medicine for pain, discomfort, or fever as directed by your caregiver.   Follow your caregiver's suggestions for further testing if your chest pain does not go away.   Keep any follow-up appointments you made. If you do not go to an appointment, you could develop lasting (chronic) problems with pain. If there is any problem keeping an appointment, you must call to reschedule.  SEEK MEDICAL CARE IF:   You think you are having problems from the medicine you are taking. Read your medicine instructions carefully.   Your chest pain does not go away, even after treatment.   You develop a rash with blisters on your chest.  SEEK IMMEDIATE MEDICAL CARE IF:   You have increased chest pain or pain that spreads to your arm, neck, jaw, back, or abdomen.   You develop shortness of breath, an increasing cough, or you are coughing up blood.   You have severe back or abdominal pain, feel nauseous, or vomit.   You develop severe weakness, fainting, or chills.   You have a fever.  THIS IS AN  EMERGENCY. Do not wait to see if the pain will go away. Get medical help at once. Call your local emergency services (911 in [place] Do not drive yourself to the hospital. MAKE SURE YOU:   Understand these instructions.   Will watch your condition.   Will get help right away if you are not doing well or get worse.  Document Released: 10/07/2004 Document Revised: 12/17/2010 Document Reviewed: 08/03/2007 [employer] Patient Information 2012 [employer]. 

## 2011-04-30 NOTE — ED Notes (Signed)
Pt c/o pain in right side of neck radiating into shoulder and jaw. Pt became tearful when talking to nurse stating she does not have a job and lives with her mother. Pt also stated she was out of her thyroid meds and no longer has a PCP for refills.

## 2011-08-12 ENCOUNTER — Emergency Department (HOSPITAL_COMMUNITY): Payer: Worker's Compensation

## 2011-08-12 ENCOUNTER — Encounter (HOSPITAL_COMMUNITY): Payer: Self-pay | Admitting: Emergency Medicine

## 2011-08-12 ENCOUNTER — Emergency Department (HOSPITAL_COMMUNITY)
Admission: EM | Admit: 2011-08-12 | Discharge: 2011-08-12 | Disposition: A | Discharge status: Home / Self Care | Payer: Worker's Compensation | Attending: Emergency Medicine | Admitting: Emergency Medicine

## 2011-08-12 DIAGNOSIS — F17200 Nicotine dependence, unspecified, uncomplicated: Secondary | ICD-10-CM | POA: Insufficient documentation

## 2011-08-12 DIAGNOSIS — S8263XA Displaced fracture of lateral malleolus of unspecified fibula, initial encounter for closed fracture: Secondary | ICD-10-CM | POA: Insufficient documentation

## 2011-08-12 DIAGNOSIS — W010XXA Fall on same level from slipping, tripping and stumbling without subsequent striking against object, initial encounter: Secondary | ICD-10-CM | POA: Insufficient documentation

## 2011-08-12 DIAGNOSIS — S82409A Unspecified fracture of shaft of unspecified fibula, initial encounter for closed fracture: Secondary | ICD-10-CM

## 2011-08-12 MED ORDER — OXYCODONE-ACETAMINOPHEN 5-325 MG PO TABS: 1.0000 | ORAL_TABLET | Freq: Four times a day (QID) | ORAL | Status: AC | PRN

## 2011-08-12 MED ORDER — OXYCODONE-ACETAMINOPHEN 5-325 MG PO TABS
1.0000 | ORAL_TABLET | Freq: Once | ORAL | Status: AC
  Administered 2011-08-12: 1 via ORAL
  Filled 2011-08-12: qty 1

## 2011-08-12 NOTE — ED Notes (Signed)
Ortho paged. On their way to ED.

## 2011-08-12 NOTE — ED Notes (Addendum)
Pt reports fell yesterday while at church. Pt presents with swelling to right foot and ankle. Pulse noted in right foot. Pt took ibuprofen today.

## 2011-08-12 NOTE — ED Provider Notes (Signed)
History   This chart was scribed for Cynthia Chick, MD by Cynthia Patton. The patient was seen in room TR08C/TR08C. Patient's care was started at 1045.     CSN: [id number]  Arrival date & time 08/12/11  1045   First MD Initiated Contact with Patient 08/12/11 1140      Chief Complaint  Patient presents with  . Fall  . Ankle Pain    right ankle    (Consider location/radiation/quality/duration/timing/severity/associated sxs/prior treatment) Patient is a 51 y.o. female presenting with ankle pain. The history is provided by the patient. No language interpreter was used.  Ankle Pain  The incident occurred yesterday. Incident location: At church. The injury mechanism was torsion and a fall. The pain is present in the left foot. The pain is moderate. The pain has been constant since onset. Pertinent negatives include no numbness, no loss of motion, no loss of sensation and no tingling. She reports no foreign bodies present. She has tried NSAIDs for the symptoms.    Cynthia Patton is a 51 y.o. female who presents to the Emergency Department complaining of moderate, constant right ankle pain with associated swelling onset 1 day ago. Patient states that she stepped into a small hole, fell and twisted her ankle yesterday at church. Patient has been taking Ibuprofen for pain. Patient has a history of HTN and thyroid disease. Her surgical history includes cholecystectomy and abdominal hysterectomy.  Past Medical History  Diagnosis Date  . Hypertension   . Thyroid disease     Past Surgical History  Procedure Date  . Cholecystectomy   . Abdominal hysterectomy     No family history on file.  History  Substance Use Topics  . Smoking status: Current Everyday Smoker -- 0.5 packs/day    Types: Cigarettes  . Smokeless tobacco: Not on file  . Alcohol Use: No    OB History    Grav Para Term Preterm Abortions TAB SAB Ect Mult Living                  Review of Systems  Musculoskeletal:       Positive for right ankle pain, right ankle swelling.  Neurological: Negative for tingling and numbness.  All other systems reviewed and are negative.    Allergies  Floxin  Home Medications   Current Outpatient Rx  Name Route Sig Dispense Refill  . ALBUTEROL SULFATE HFA 108 (90 BASE) MCG/ACT IN AERS Inhalation Inhale 2 puffs into the lungs every 6 (six) hours as needed. For wheezing/shortness of breath    . DILTIAZEM HCL ER BEADS 120 MG PO CP24 Oral Take 120 mg by mouth daily.    Marland Kitchen LEVOTHYROXINE SODIUM 200 MCG PO TABS Oral Take 1 tablet (200 mcg total) by mouth daily. 10 tablet 0  . FEXOFENADINE HCL 180 MG PO TABS Oral Take 180 mg by mouth daily as needed. For allergies    . OXYCODONE-ACETAMINOPHEN 5-325 MG PO TABS Oral Take 1-2 tablets by mouth every 6 (six) hours as needed for pain. 15 tablet 0    BP 147/87  Pulse 91  Temp 98.1 F (36.7 C) (Oral)  Resp 16  SpO2 97%  Physical Exam  Nursing note and vitals reviewed. Constitutional: She is oriented to person, place, and time. She appears well-developed and well-nourished. No distress.  HENT:  Head: Normocephalic and atraumatic.  Eyes: EOM are normal. Pupils are equal, round, and reactive to light.  Neck: Neck supple. No tracheal deviation present.  Cardiovascular: Normal  rate.   Pulmonary/Chest: Effort normal. No respiratory distress.  Abdominal: Soft. She exhibits no distension.  Musculoskeletal: Normal range of motion. She exhibits no edema.       Tenderness to palpation over right lateral malleolus. Right foot is distally, neurovascularly intact.  Neurological: She is alert and oriented to person, place, and time. No sensory deficit.  Skin: Skin is warm and dry.  Psychiatric: She has a normal mood and affect. Her behavior is normal.    ED Course  Procedures (including critical care time) DIAGNOSTIC STUDIES: Oxygen Saturation is 97% on room air, adequate by my interpretation.    COORDINATION OF CARE: 12:08pm-  Patient informed of current plan for treatment and evaluation and agrees with plan at this time. Patient's X-ray shows a small fracture to the lateral malleolus. Will apply ankle split and provide crutches. Will discharge with instructions to follow up with an orthopedist.   Labs Reviewed - No data to display  Dg Ankle Complete Right  08/12/2011  *RADIOLOGY REPORT*  Clinical Data: Fall, lateral pain.  RIGHT ANKLE - COMPLETE 3+ VIEW  Comparison: None.  Findings: Nondisplaced fracture through the tip of the lateral malleolus.  No tibial abnormality.  Diffuse soft tissue swelling, most pronounced laterally.  Ankle mortise is intact.  IMPRESSION: Nondisplaced lateral malleolar fracture.  Original Report Authenticated By: Cyndie Chime, M.D.     1. Fibula fracture       MDM  Pt with ankle pain after twisting injury.  Xray shows nondisplaced fracture of distal fibula/lateral malleolus.  Xrays reviewed by me as well.  Pt placed in ankle splint and crutches supplied.  Discharged with strict return precautions.  Pt agreeable with plan.  Advised to arrange for orthopedic follow up     I personally performed the services described in this documentation, which was scribed in my presence. The recorded information has been reviewed and considered.    Cynthia Chick, MD 08/12/11 1911

## 2011-08-12 NOTE — Progress Notes (Signed)
Orthopedic Tech Progress Note Patient Details:  Cynthia Patton 23-Mar-1960 [id number]  Ortho Devices Type of Ortho Device: Crutches;Stirrup splint;Short leg splint Ortho Device/Splint Interventions: Application   Cammer, Mickie Bail 08/12/2011, 12:51 PM

## 2011-08-31 ENCOUNTER — Ambulatory Visit
Admission: RE | Admit: 2011-08-31 | Discharge: 2011-08-31 | Disposition: A | Discharge status: Home / Self Care | Payer: Worker's Compensation | Source: Ambulatory Visit | Attending: Family Medicine | Admitting: Family Medicine

## 2011-08-31 ENCOUNTER — Other Ambulatory Visit: Payer: Self-pay | Admitting: Family Medicine

## 2011-08-31 DIAGNOSIS — R609 Edema, unspecified: Secondary | ICD-10-CM

## 2011-10-11 ENCOUNTER — Ambulatory Visit: Payer: No Typology Code available for payment source | Admitting: Physical Therapy

## 2011-10-19 ENCOUNTER — Ambulatory Visit: Payer: No Typology Code available for payment source | Admitting: Physical Therapy

## 2011-10-27 ENCOUNTER — Ambulatory Visit: Payer: Self-pay | Attending: Family Medicine | Admitting: Physical Therapy

## 2011-10-27 DIAGNOSIS — M25673 Stiffness of unspecified ankle, not elsewhere classified: Secondary | ICD-10-CM | POA: Insufficient documentation

## 2011-10-27 DIAGNOSIS — M25676 Stiffness of unspecified foot, not elsewhere classified: Secondary | ICD-10-CM | POA: Insufficient documentation

## 2011-10-27 DIAGNOSIS — R262 Difficulty in walking, not elsewhere classified: Secondary | ICD-10-CM | POA: Insufficient documentation

## 2011-10-27 DIAGNOSIS — M25579 Pain in unspecified ankle and joints of unspecified foot: Secondary | ICD-10-CM | POA: Insufficient documentation

## 2011-10-27 DIAGNOSIS — IMO0001 Reserved for inherently not codable concepts without codable children: Secondary | ICD-10-CM | POA: Insufficient documentation

## 2011-11-02 ENCOUNTER — Ambulatory Visit: Payer: Self-pay | Admitting: Physical Therapy

## 2011-11-04 ENCOUNTER — Ambulatory Visit: Payer: Self-pay | Admitting: Physical Therapy

## 2011-11-09 ENCOUNTER — Ambulatory Visit: Payer: Self-pay | Admitting: Physical Therapy

## 2011-11-12 ENCOUNTER — Ambulatory Visit: Payer: Self-pay | Attending: Family Medicine | Admitting: Physical Therapy

## 2011-11-12 DIAGNOSIS — M25676 Stiffness of unspecified foot, not elsewhere classified: Secondary | ICD-10-CM | POA: Insufficient documentation

## 2011-11-12 DIAGNOSIS — IMO0001 Reserved for inherently not codable concepts without codable children: Secondary | ICD-10-CM | POA: Insufficient documentation

## 2011-11-12 DIAGNOSIS — M25673 Stiffness of unspecified ankle, not elsewhere classified: Secondary | ICD-10-CM | POA: Insufficient documentation

## 2011-11-12 DIAGNOSIS — R262 Difficulty in walking, not elsewhere classified: Secondary | ICD-10-CM | POA: Insufficient documentation

## 2011-11-12 DIAGNOSIS — M25579 Pain in unspecified ankle and joints of unspecified foot: Secondary | ICD-10-CM | POA: Insufficient documentation

## 2011-11-16 ENCOUNTER — Ambulatory Visit: Payer: Self-pay | Admitting: Physical Therapy

## 2011-11-18 ENCOUNTER — Ambulatory Visit: Payer: Self-pay | Admitting: Physical Therapy

## 2011-11-23 ENCOUNTER — Ambulatory Visit: Payer: Self-pay | Admitting: Physical Therapy

## 2011-11-25 ENCOUNTER — Ambulatory Visit: Payer: Self-pay | Admitting: Physical Therapy

## 2011-12-07 ENCOUNTER — Ambulatory Visit: Payer: No Typology Code available for payment source | Admitting: Physical Therapy

## 2011-12-14 ENCOUNTER — Ambulatory Visit: Payer: No Typology Code available for payment source | Admitting: Physical Therapy

## 2011-12-16 ENCOUNTER — Ambulatory Visit: Payer: Self-pay | Attending: Family Medicine | Admitting: Physical Therapy

## 2011-12-16 DIAGNOSIS — M25676 Stiffness of unspecified foot, not elsewhere classified: Secondary | ICD-10-CM | POA: Insufficient documentation

## 2011-12-16 DIAGNOSIS — R262 Difficulty in walking, not elsewhere classified: Secondary | ICD-10-CM | POA: Insufficient documentation

## 2011-12-16 DIAGNOSIS — M25579 Pain in unspecified ankle and joints of unspecified foot: Secondary | ICD-10-CM | POA: Insufficient documentation

## 2011-12-16 DIAGNOSIS — IMO0001 Reserved for inherently not codable concepts without codable children: Secondary | ICD-10-CM | POA: Insufficient documentation

## 2011-12-16 DIAGNOSIS — M25673 Stiffness of unspecified ankle, not elsewhere classified: Secondary | ICD-10-CM | POA: Insufficient documentation

## 2011-12-21 ENCOUNTER — Ambulatory Visit: Payer: Self-pay | Admitting: Physical Therapy

## 2011-12-23 ENCOUNTER — Ambulatory Visit: Payer: Self-pay | Admitting: Physical Therapy

## 2012-01-25 ENCOUNTER — Other Ambulatory Visit (HOSPITAL_COMMUNITY): Payer: Self-pay | Admitting: Family Medicine

## 2012-01-25 DIAGNOSIS — M25571 Pain in right ankle and joints of right foot: Secondary | ICD-10-CM

## 2012-01-27 ENCOUNTER — Ambulatory Visit (HOSPITAL_COMMUNITY)
Admission: RE | Admit: 2012-01-27 | Discharge: 2012-01-27 | Payer: No Typology Code available for payment source | Source: Ambulatory Visit | Attending: Family Medicine | Admitting: Family Medicine

## 2012-02-03 ENCOUNTER — Ambulatory Visit (HOSPITAL_COMMUNITY)
Admission: RE | Admit: 2012-02-03 | Discharge: 2012-02-03 | Disposition: A | Discharge status: Home / Self Care | Payer: Self-pay | Source: Ambulatory Visit | Attending: Family Medicine | Admitting: Family Medicine

## 2012-02-03 DIAGNOSIS — M25579 Pain in unspecified ankle and joints of unspecified foot: Secondary | ICD-10-CM | POA: Insufficient documentation

## 2012-02-03 DIAGNOSIS — M7989 Other specified soft tissue disorders: Secondary | ICD-10-CM | POA: Insufficient documentation

## 2012-02-03 DIAGNOSIS — M25571 Pain in right ankle and joints of right foot: Secondary | ICD-10-CM

## 2013-04-20 ENCOUNTER — Emergency Department (HOSPITAL_COMMUNITY): Payer: Self-pay

## 2013-04-20 ENCOUNTER — Emergency Department (HOSPITAL_COMMUNITY)
Admission: EM | Admit: 2013-04-20 | Discharge: 2013-04-20 | Disposition: A | Discharge status: Home / Self Care | Payer: Self-pay | Attending: Emergency Medicine | Admitting: Emergency Medicine

## 2013-04-20 DIAGNOSIS — R079 Chest pain, unspecified: Secondary | ICD-10-CM

## 2013-04-20 DIAGNOSIS — Z888 Allergy status to other drugs, medicaments and biological substances status: Secondary | ICD-10-CM | POA: Insufficient documentation

## 2013-04-20 DIAGNOSIS — J209 Acute bronchitis, unspecified: Secondary | ICD-10-CM | POA: Insufficient documentation

## 2013-04-20 DIAGNOSIS — E079 Disorder of thyroid, unspecified: Secondary | ICD-10-CM | POA: Insufficient documentation

## 2013-04-20 DIAGNOSIS — F17200 Nicotine dependence, unspecified, uncomplicated: Secondary | ICD-10-CM | POA: Insufficient documentation

## 2013-04-20 DIAGNOSIS — I10 Essential (primary) hypertension: Secondary | ICD-10-CM | POA: Insufficient documentation

## 2013-04-20 DIAGNOSIS — Z79899 Other long term (current) drug therapy: Secondary | ICD-10-CM | POA: Insufficient documentation

## 2013-04-20 DIAGNOSIS — R0789 Other chest pain: Secondary | ICD-10-CM | POA: Insufficient documentation

## 2013-04-20 LAB — CBC
HCT: 41.2 % (ref 36.0–46.0)
HEMOGLOBIN: 14 g/dL (ref 12.0–15.0)
MCH: 31.3 pg (ref 26.0–34.0)
MCHC: 34 g/dL (ref 30.0–36.0)
MCV: 92 fL (ref 78.0–100.0)
PLATELETS: 374 10*3/uL (ref 150–400)
RBC: 4.48 MIL/uL (ref 3.87–5.11)
RDW: 13.8 % (ref 11.5–15.5)
WBC: 8.7 10*3/uL (ref 4.0–10.5)

## 2013-04-20 LAB — I-STAT TROPONIN, ED
TROPONIN I, POC: 0 ng/mL (ref 0.00–0.08)
TROPONIN I, POC: 0 ng/mL (ref 0.00–0.08)
TROPONIN I, POC: 0.01 ng/mL (ref 0.00–0.08)

## 2013-04-20 LAB — BASIC METABOLIC PANEL
BUN: 6 mg/dL (ref 6–23)
CHLORIDE: 102 meq/L (ref 96–112)
CO2: 24 meq/L (ref 19–32)
Calcium: 8.8 mg/dL (ref 8.4–10.5)
Creatinine, Ser: 0.85 mg/dL (ref 0.50–1.10)
GFR calc Af Amer: 88 mL/min — ABNORMAL LOW (ref >90)
GFR calc non Af Amer: 76 mL/min — ABNORMAL LOW (ref >90)
GLUCOSE: 115 mg/dL — AB (ref 70–99)
POTASSIUM: 3.8 meq/L (ref 3.7–5.3)
SODIUM: 143 meq/L (ref 137–147)

## 2013-04-20 LAB — PRO B NATRIURETIC PEPTIDE: PRO B NATRI PEPTIDE: 41.1 pg/mL (ref 0–125)

## 2013-04-20 MED ORDER — ALBUTEROL SULFATE HFA 108 (90 BASE) MCG/ACT IN AERS: 2.0000 | INHALATION_SPRAY | Freq: Four times a day (QID) | RESPIRATORY_TRACT | Status: DC | PRN

## 2013-04-20 MED ORDER — DILTIAZEM HCL ER COATED BEADS 180 MG PO CP24: 180.0000 mg | ORAL_CAPSULE | Freq: Every day | ORAL | Status: DC

## 2013-04-20 MED ORDER — AZITHROMYCIN 250 MG PO TABS: 250.0000 mg | ORAL_TABLET | Freq: Every day | ORAL | Status: DC

## 2013-04-20 MED ORDER — DILTIAZEM HCL ER COATED BEADS 120 MG PO CP24: 120.0000 mg | ORAL_CAPSULE | Freq: Every day | ORAL | Status: DC

## 2013-04-20 MED ORDER — LEVOTHYROXINE SODIUM 175 MCG PO TABS: 175.0000 ug | ORAL_TABLET | Freq: Every day | ORAL | Status: DC

## 2013-04-20 NOTE — ED Provider Notes (Signed)
CSN: [id number]     Arrival date & time 04/20/13  1415 History   First MD Initiated Contact with Patient 04/20/13 1758     Chief Complaint  Patient presents with  . Cough  . Chest Pain     (Consider location/radiation/quality/duration/timing/severity/associated sxs/prior Treatment) The history is provided by the patient.  History of present illness: 53 year old female who presents with chief complaint of cough and chest pain. Onset of cough was 2 months ago. Patient reports daily cough worse in the morning with intermittent associated yellow sputum production. She denies history of fevers or chills. She also reports 2 episodes of chest pain. Initial episode was 4 days ago. Chest pain is associated with forceful coughing. She denies chest pain or dyspnea on exertion. Chest pain is located in the center of the chest. It is described as tightness. Rated mild to moderate in severity. Pain is nonradiating. She had no chest pain for the past 2 days but then had a second similar episode of chest pain after forceful coughing today that prompted her visit to the emergency department. No chest pain at time of exam.  No recent travel.  Past Medical History  Diagnosis Date  . Hypertension   . Thyroid disease    Past Surgical History  Procedure Laterality Date  . Cholecystectomy    . Abdominal hysterectomy     No family history on file. History  Substance Use Topics  . Smoking status: Current Every Day Smoker -- 0.50 packs/day    Types: Cigarettes  . Smokeless tobacco: Not on file  . Alcohol Use: No   OB History   Grav Para Term Preterm Abortions TAB SAB Ect Mult Living                 Review of Systems  Constitutional: Negative for fever and chills.  HENT: Positive for congestion.   Eyes: Negative for pain.  Respiratory: Positive for cough. Negative for shortness of breath.   Cardiovascular: Positive for chest pain. Negative for leg swelling.  Gastrointestinal: Negative for nausea,  vomiting, abdominal pain, diarrhea and constipation.  Genitourinary: Negative for dysuria.  Musculoskeletal: Negative for back pain.  Skin: Negative for rash and wound.  Neurological: Negative for headaches.  All other systems reviewed and are negative.     Allergies  Floxin  Home Medications   Current Outpatient Rx  Name  Route  Sig  Dispense  Refill  . albuterol (PROVENTIL HFA;VENTOLIN HFA) 108 (90 BASE) MCG/ACT inhaler   Inhalation   Inhale 2 puffs into the lungs every 6 (six) hours as needed. For wheezing/shortness of breath         . diltiazem (TIAZAC) 120 MG 24 hr capsule   Oral   Take 120 mg by mouth daily.         . fexofenadine (ALLEGRA) 180 MG tablet   Oral   Take 180 mg by mouth daily as needed. For allergies         . levothyroxine (SYNTHROID, LEVOTHROID) 200 MCG tablet   Oral   Take 1 tablet (200 mcg total) by mouth daily.   10 tablet   0    BP 176/94  Pulse 100  Temp(Src) 98.5 F (36.9 C) (Oral)  Resp 20  Wt 190 lb (86.183 kg)  SpO2 100% Physical Exam  Nursing note and vitals reviewed. Constitutional: She is oriented to person, place, and time. She appears well-developed and well-nourished. No distress.  HENT:  Head: Normocephalic and atraumatic.  Eyes: Conjunctivae  are normal.  Neck: Neck supple.  Cardiovascular: Normal rate, regular rhythm, normal heart sounds and intact distal pulses.   Pulmonary/Chest: Effort normal. She has no wheezes. She has rhonchi (slight bilaterally). She has no rales.  Abdominal: Soft. She exhibits no distension. There is no tenderness.  Musculoskeletal: Normal range of motion. She exhibits no edema.  Neurological: She is alert and oriented to person, place, and time.  Skin: Skin is warm and dry.    ED Course  Procedures (including critical care time) Labs Review Labs Reviewed  BASIC METABOLIC PANEL - Abnormal; Notable for the following:    Glucose, Bld 115 (*)    GFR calc non Af Amer 76 (*)    GFR calc  Af Amer 88 (*)    All other components within normal limits  CBC  PRO B NATRIURETIC PEPTIDE  I-STAT TROPOININ, ED  I-STAT TROPOININ, ED   Imaging Review Dg Chest 2 View  04/20/2013   CLINICAL DATA:  Chest pain and left arm numbness for 3 days  EXAM: CHEST  2 VIEW  COMPARISON:  DG CHEST 2 VIEW dated 04/30/2011; DG CHEST 2 VIEW dated 03/28/2011  FINDINGS: The lungs are adequately inflated. The pulmonary interstitial markings are mildly increased in the right lower lung field especially but this is not a new finding. The cardiac silhouette is normal in size. The pulmonary vascularity is not clearly engorged. There is no pleural effusion or pneumothorax. The observed portions of the bony thorax appear normal.  IMPRESSION: There is no definite evidence of pneumonia nor CHF. Mildly increased interstitial markings may reflect the patient's smoking history.   Electronically Signed   By: David  Martinique   On: 04/20/2013 15:14     EKG Interpretation None      MDM   Final diagnoses:  None    53 year old female with history of hypertension and hypothyroidism who presents with 2 episodes of chest pain associated with forceful coughing and 2 months of intermittent cough. over the course of the past 4 days. Vital signs stable.  Field pain likely due to acute cough and likely acute bronchitis Pain atypical, but concerning for possible atypical presentation of ACS. Heart score of 3. Initial troponin negative and will get 3 hours delta troponin. No risk factors and have low suspicion for PE.    Date: 04/20/2013  Rate: 90  Rhythm: normal sinus rhythm  QRS Axis: normal  Intervals: normal  ST/T Wave abnormalities: TWI in III and aVF  Conduction Disutrbances:none  Narrative Interpretation: non-specific T wave changes inferiorly with otherwise normal EKG  Old EKG Reviewed: unchanged from EKG 04/30/11  Troponin negative x2. CXR without evidence of pneumonia. Symptoms consistent with likely acute bronchitis.  Counseled patient that she needed to decrease tobacco use. Feel this would significantly improve her symptoms. Will discharge with short course of azithromycin to cover atypical organisms. We'll also provide refill for antihypertensives and Synthroid as patient is currently between PCPs.  Renaldo Reel, MD 04/21/13 [phone]

## 2013-04-20 NOTE — ED Notes (Signed)
Pt up to BR to void.  Appears in no distress, smiling and laughing.

## 2013-04-20 NOTE — Discharge Instructions (Signed)
Your caregiver has diagnosed you as having chest pain that is not specific for one problem, but does not require admission.  You are at low risk for an acute heart condition or other serious illness. Chest pain comes from many different causes.  °SEEK IMMEDIATE MEDICAL ATTENTION IF: °You have severe chest pain, especially if the pain is crushing or pressure-like and spreads to the arms, back, neck, or jaw, or if you have sweating, nausea (feeling sick to your stomach), or shortness of breath. THIS IS AN EMERGENCY. Don't wait to see if the pain will go away. Get medical help at once. Call 911 or 0 (operator). DO NOT drive yourself to the hospital.  °Your chest pain gets worse and does not go away with rest.  °You have an attack of chest pain lasting longer than usual, despite rest and treatment with the medications your caregiver has prescribed.  °You wake from sleep with chest pain or shortness of breath.  °You feel dizzy or faint.  °You have chest pain not typical of your usual pain for which you originally saw your caregiver. ° ° °Emergency Department Resource Guide °1) Find a Doctor and Pay Out of Pocket °Although you won't have to find out who is covered by your insurance plan, it is a good idea to ask around and get recommendations. You will then need to call the office and see if the doctor you have chosen will accept you as a new patient and what types of options they offer for patients who are self-pay. Some doctors offer discounts or will set up payment plans for their patients who do not have insurance, but you will need to ask so you aren't surprised when you get to your appointment. ° °2) Contact Your Local Health Department °Not all health departments have doctors that can see patients for sick visits, but many do, so it is worth a call to see if yours does. If you don't know where your local health department is, you can check in your phone book. The CDC also has a tool to help you locate your state's  health department, and many state websites also have listings of all of their local health departments. ° °3) Find a Walk-in Clinic °If your illness is not likely to be very severe or complicated, you may want to try a walk in clinic. These are popping up all over the country in pharmacies, drugstores, and shopping centers. They're usually staffed by nurse practitioners or physician assistants that have been trained to treat common illnesses and complaints. They're usually fairly quick and inexpensive. However, if you have serious medical issues or chronic medical problems, these are probably not your best option. ° °No Primary Care Doctor: °- Call Health Connect at  [phone] - they can help you locate a primary care doctor that  accepts your insurance, provides certain services, etc. °- Physician Referral Service- [phone] ° °Chronic Pain Problems: °Organization         Address  Phone   Notes  °[facility]  [phone] Patients need to be referred by their primary care doctor.  ° °Medication Assistance: °Organization         Address  Phone   Notes  °[place] [facility] [address] °[place], [place] [address] [phone] --Must be a resident of [place] °-- Must have NO insurance coverage whatsoever (no Medicaid/ Medicare, etc.) °-- The pt. MUST have a primary care doctor that   directs their care regularly and follows them in the community   [facility]  [phone]   [employer]  8595160036    Agencies that provide inexpensive medical care: Organization         Address  Phone   Notes  [place]  [phone]   [facility]    [phone]   [facility] [place], [place] [address] [phone]   [place] [address], [place] [phone]   Planned Parenthood    5618679661   [facility]    [phone]    [address], [place] Phone:  [phone], Fax:  [phone] Hours of Operation:  9 am - 6 pm, M-F.  Also accepts Medicaid/Medicare and self-pay.  [facility] for [place], [address], [place] Phone: [phone], Fax: [phone]. Hours of Operation:  8:30 am - 5:30 pm, M-F.  Also accepts Medicaid and self-pay.  [facility] [place] [address], [place] Phone: [phone]   [place], [place], [place] [phone], Ext. [phone] Mondays & Thursdays: 7-9 AM.  First 15 patients are seen on a first come, first serve basis.    [place] Providers:  Organization         Address  Phone   Notes  [facility] [address], [place] [phone] Also accepts self-pay patients.  [facility] [place] [address], [place]  [phone]   [place], [address], [place] [phone]   [facility] [address] [place] [phone]   Lucianne Lei [address], [place]   [phone] Only accepts [place] Access [place] patients after they have their name applied to their card.   Self-Pay (no insurance) in [facility]:  Organization         Address  Phone   Notes  Sickle Cell Patients, [facility] [place] [phone]   [facility] Urgent Care [place] [phone]   [facility] [place]  [place], [place], [place] [phone]   [facility]/Dr. Osei-Bonsu  [address], [place] [phone] Phone number for both [place] and [place] locations is the same.  Urgent Medical and [facility] [address], [place] [phone]     [facility] [address], [place] or [address] [phone]   [facility] [address], [place] [phone], phone; [phone], fax Sees patients 1st and 3rd Saturday of every month.  Must not qualify for public or private insurance (i.e. Medicaid, Medicare, [place] Health Choice, Veterans' Benefits)  Household income should be no more than 200% of the poverty level The clinic cannot treat you if you are pregnant or think you are pregnant  Sexually transmitted diseases are not treated at the clinic.    Dental Care: Organization         Address  Phone  Notes  [facility] [address] [place], [place] [phone] Accepts children  up to age 23 who are enrolled in Medicaid or [place] Health Choice; pregnant women with a Medicaid card; and children who have applied for Medicaid or [place] Health Choice, but were declined, whose parents can pay a reduced fee at time of service.  [facility] Department of [facility] [place]  [address], [place] [phone] Accepts children up to age 90 who are enrolled in [place] or [place]; pregnant women with a Medicaid card; and children who have applied for Medicaid or  Health Choice, but were declined, whose parents can pay a reduced fee at time of service.  [place] Adult [facility] Access PROGRAM  [place] [phone] Patients are seen by appointment only. Walk-ins are not accepted. [facility] will see patients 75 years of age and older. Monday - Tuesday (8am-5pm) Most Wednesdays (8:30-5pm) $30 per visit, cash only  [facility] Adult Dental Access PROGRAM  [address], [facility] [phone] Patients are seen by appointment only. Walk-ins are not accepted. [place] will see patients 44 years of age and older. One Wednesday Evening (Monthly: Volunteer Based).   $30 per visit, cash only  [place]  [phone] for adults; Children under age 62, call Graduate Pediatric Dentistry at [phone]. Children aged 84-14, please call [phone] to request a pediatric application.  Dental services are provided in all areas of dental care including fillings, crowns and bridges, complete and partial dentures, implants, gum treatment, root canals, and extractions. Preventive care is also provided. Treatment is provided to both adults and children. Patients are selected via a lottery and there is often a waiting list.   [facility] [address] [place]  [phone] [email or web address]   [facility] [address] [place], [place] [phone], Ext. [phone] Second and Fourth Thursday of each month, opens at 6:30 AM; Clinic ends at 9 AM.  Patients are seen on a first-come first-served basis, and a limited number are seen during each clinic.   [facility]  [address] Hillard Danker [place], [place] [phone]   Eligibility Requirements You must have lived in [place], [place], or [place] counties for at least the last three months.   You cannot be eligible for state or federal sponsored [employer], including [employer], [place], or [employer].   You generally cannot be eligible for healthcare insurance through your employer.    How to apply: Eligibility screenings are held every Tuesday and Wednesday afternoon from 1:00 pm until 4:00 pm. You do not need an appointment for the interview!  [facility] [address] [place], [place]   [place]  [place] Department  [place]  [phone]    Behavioral Health Resources in the Community: Intensive Outpatient Programs Organization         Address  Phone  Notes  [place] [facility]. [address] [place], [place] [phone]   [facility]patient [address], [place], [place]. [place]   ADS: Alcohol & Drug Svcs [address], [place], [place]   [place] [address],  [place], [place] or [phone]   Substance Abuse Resources Organization         Address  Phone  Notes  Alcohol and Drug Services  [phone]   Addiction  [facility]  [phone]   [facility] [place]  [phone]   Chinita Pester  [phone]   Residential & Outpatient Substance Abuse Program  [phone]   Psychological Services Organization         Address  Phone  Notes  [facility] [place]  [place]  [phone]   [place] [address] [place] or [phone]    Mobile Crisis Teams Organization         Address  Phone  Notes  [facility], Mobile Crisis Care Unit  [phone]   [facility]  [address] [place], [place]   [place] Levels [address] [place] [phone]    Self-Help/Support Groups Organization         Address  Phone             Notes  [place]. of [place] - variety of support groups  [place] Call for more information  Narcotics Anonymous (NA), [facility] [address], [employer] [place]  2 meetings at this location   Special educational needs teacher         Address  Phone  Notes  [facility]idential [facility] [place],    [place]  [phone]   [facility]  [address], [place] [address], [place], [place]   [place], [place] [phone] Admissions: 8am-3pm M-F  Incentives Substance [place] [address],    [place], [place] [phone]   [facility] [address] [place], [place], [place]   [facility] [address]  [place], [place]   [facility] - Intensive Outpatient [address] Kristeen [address], [place], [place]   [facility] [address]  [place], [place] [phone] or [phone]   [facility] Services (RTS) [address], [place], [place] Accepts Medicaid  [facility] [place] [address]  [place] [phone] Substance Abuse/Addiction Treatment   [facility] Organization         Address  Phone  Notes  [facility]  [phone]   Domenic Schwab, PhD [address] Arlis Porta [place], [place]   [phone] or [phone]   [facility] [place], [place] [phone]   [facility] [address], [place], [place] [phone] Insurance/Medicaid/sponsorship through [facility] and Families [address] [place]                                    [place], [place] [phone] [place] [address], [place] [phone]    Dr. Adele Schilder  [phone]   [facility] Dept. 1) [address], [place] 2) [place] 3)  [place] [address], [place] [phone]  [phone]   [place] [phone] or [phone] (After Hours)

## 2013-04-20 NOTE — ED Notes (Signed)
Pt impatient, refused final vital signs.  Discharged home with pain <3.

## 2013-04-20 NOTE — ED Notes (Signed)
Pt continues to come to desk asking where physician is.  She is anxious to go home.  Refuses to leave monitor on.

## 2013-04-20 NOTE — ED Notes (Signed)
Pt in c/o productive cough over the last few days, intermittent fever, yellow sputum, increased chest pain, pain to right rib area and tightness to center of chest, increased shortness of breath today, no distress at this time

## 2013-04-20 NOTE — ED Notes (Signed)
Dr Reather Converse in room with pt.

## 2013-04-20 NOTE — ED Notes (Signed)
Pt states she has had a cough for about two months.

## 2013-04-21 NOTE — ED Provider Notes (Signed)
Medical screening examination/treatment/procedure(s) were conducted as a shared visit with non-physician practitioner(s) or resident  and myself.  I personally evaluated the patient during the encounter and agree with the findings and plan unless otherwise indicated.    I have personally reviewed any xrays and/ or EKG's with the provider and I agree with interpretation.   Recurrent productive cough for the past 2 months with brief intermittent chest ache only with coughing. Patient with few rales at bases, no Tenderness or significant swelling in her lower extremities, reg rate and rhythm, well-appearing.  EKG reviewed with atypical chest pain and similar to previous.  Follow-up outpatient discussed.  Labs Reviewed  BASIC METABOLIC PANEL - Abnormal; Notable for the following:    Glucose, Bld 115 (*)    GFR calc non Af Amer 76 (*)    GFR calc Af Amer 88 (*)    All other components within normal limits  CBC  PRO B NATRIURETIC PEPTIDE  I-STAT TROPOININ, ED  I-STAT TROPOININ, ED  I-STAT TROPOININ, ED   Filed Vitals:   04/20/13 1425 04/20/13 1830 04/20/13 1900 04/20/13 1944  BP: 176/94 178/85 168/78 178/82  Pulse: 100 82 78 85  Temp: 98.5 F (36.9 C)     TempSrc: Oral     Resp: 20 16 17    Weight: 190 lb (86.183 kg)     SpO2: 100% 98% 94% 97%    Cough, atypical chest pain   Mariea Clonts, MD 04/21/13 0127

## 2013-06-08 ENCOUNTER — Emergency Department (INDEPENDENT_AMBULATORY_CARE_PROVIDER_SITE_OTHER)
Admission: EM | Admit: 2013-06-08 | Discharge: 2013-06-08 | Disposition: A | Discharge status: Home / Self Care | Payer: Self-pay | Source: Home / Self Care | Attending: Family Medicine | Admitting: Family Medicine

## 2013-06-08 ENCOUNTER — Encounter (HOSPITAL_COMMUNITY): Payer: Self-pay | Admitting: Emergency Medicine

## 2013-06-08 DIAGNOSIS — E039 Hypothyroidism, unspecified: Secondary | ICD-10-CM

## 2013-06-08 DIAGNOSIS — I10 Essential (primary) hypertension: Secondary | ICD-10-CM

## 2013-06-08 MED ORDER — DILTIAZEM HCL ER COATED BEADS 120 MG PO CP24: 120.0000 mg | ORAL_CAPSULE | Freq: Every day | ORAL | Status: DC

## 2013-06-08 MED ORDER — LEVOTHYROXINE SODIUM 200 MCG PO TABS: 200.0000 ug | ORAL_TABLET | Freq: Every day | ORAL | Status: DC

## 2013-06-08 NOTE — Discharge Instructions (Signed)
DASH Diet  The DASH diet stands for "Dietary Approaches to Stop Hypertension." It is a healthy eating plan that has been shown to reduce high blood pressure (hypertension) in as little as 14 days, while also possibly providing other significant health benefits. These other health benefits include reducing the risk of breast cancer after menopause and reducing the risk of type 2 diabetes, heart disease, colon cancer, and stroke. Health benefits also include weight loss and slowing kidney failure in patients with chronic kidney disease.   DIET GUIDELINES  · Limit salt (sodium). Your diet should contain less than 1500 mg of sodium daily.  · Limit refined or processed carbohydrates. Your diet should include mostly whole grains. Desserts and added sugars should be used sparingly.  · Include small amounts of heart-healthy fats. These types of fats include nuts, oils, and tub margarine. Limit saturated and trans fats. These fats have been shown to be harmful in the body.  CHOOSING FOODS   The following food groups are based on a 2000 calorie diet. See your Registered Dietitian for individual calorie needs.  Grains and Grain Products (6 to 8 servings daily)  · Eat More Often: Whole-wheat bread, brown rice, whole-grain or wheat pasta, quinoa, popcorn without added fat or salt (air popped).  · Eat Less Often: White bread, white pasta, white rice, cornbread.  Vegetables (4 to 5 servings daily)  · Eat More Often: Fresh, frozen, and canned vegetables. Vegetables may be raw, steamed, roasted, or grilled with a minimal amount of fat.  · Eat Less Often/Avoid: Creamed or fried vegetables. Vegetables in a cheese sauce.  Fruit (4 to 5 servings daily)  · Eat More Often: All fresh, canned (in natural juice), or frozen fruits. Dried fruits without added sugar. One hundred percent fruit juice (½ cup [237 mL] daily).  · Eat Less Often: Dried fruits with added sugar. Canned fruit in light or heavy syrup.  Lean Meats, Fish, and Poultry (2  servings or less daily. One serving is 3 to 4 oz [85-114 g]).  · Eat More Often: Ninety percent or leaner ground beef, tenderloin, sirloin. Round cuts of beef, chicken breast, turkey breast. All fish. Grill, bake, or broil your meat. Nothing should be fried.  · Eat Less Often/Avoid: Fatty cuts of meat, turkey, or chicken leg, thigh, or wing. Fried cuts of meat or fish.  Dairy (2 to 3 servings)  · Eat More Often: Low-fat or fat-free milk, low-fat plain or light yogurt, reduced-fat or part-skim cheese.  · Eat Less Often/Avoid: Milk (whole, 2%). Whole milk yogurt. Full-fat cheeses.  Nuts, Seeds, and Legumes (4 to 5 servings per week)  · Eat More Often: All without added salt.  · Eat Less Often/Avoid: Salted nuts and seeds, canned beans with added salt.  Fats and Sweets (limited)  · Eat More Often: Vegetable oils, tub margarines without trans fats, sugar-free gelatin. Mayonnaise and salad dressings.  · Eat Less Often/Avoid: Coconut oils, palm oils, butter, stick margarine, cream, half and half, cookies, candy, pie.  FOR MORE INFORMATION  The Dash Diet Eating Plan: www.dashdiet.org  Document Released: 12/17/2010 Document Revised: 03/22/2011 Document Reviewed: 12/17/2010  ExitCare® Patient Information ©2014 [employer].

## 2013-06-08 NOTE — ED Provider Notes (Signed)
Medical screening examination/treatment/procedure(s) were performed by resident physician or non-physician practitioner and as supervising physician I was immediately available for consultation/collaboration.   KINDL,JAMES DOUGLAS MD.   James D Kindl, MD 06/08/13 1008 

## 2013-06-08 NOTE — ED Provider Notes (Signed)
CSN: [id number]     Arrival date & time 06/08/13  0802 History   First MD Initiated Contact with Patient 06/08/13 0825     Chief Complaint  Patient presents with  . Medication Refill   (Consider location/radiation/quality/duration/timing/severity/associated sxs/prior Treatment) HPI Comments: 53 year old female presents requesting refills of her medications. She takes 200 mcg of Synthroid daily, and 120 mg of diltiazem. She is currently between primary care providers because she lost her insurance. She has been out of both medications for 3 weeks and she is feeling very poorly. She is status post partial thyroidectomy for thyroid cancer. She takes diltiazem for her blood pressure because she also has a history of angina that the diltiazem successfully prevents. Now she is feeling fatigued and she feels like her skin is drying out. Denies any other symptoms. No chest pain.    Past Medical History  Diagnosis Date  . Hypertension   . Thyroid disease    Past Surgical History  Procedure Laterality Date  . Cholecystectomy    . Abdominal hysterectomy     History reviewed. No pertinent family history. History  Substance Use Topics  . Smoking status: Current Every Day Smoker -- 0.50 packs/day    Types: Cigarettes  . Smokeless tobacco: Not on file  . Alcohol Use: No   OB History   Grav Para Term Preterm Abortions TAB SAB Ect Mult Living                 Review of Systems  Constitutional: Positive for fatigue.  Skin:       Dry skin  All other systems reviewed and are negative.   Allergies  Floxin  Home Medications   Prior to Admission medications   Medication Sig Start Date End Date Taking? Authorizing Provider  levothyroxine (SYNTHROID, LEVOTHROID) 200 MCG tablet Take 200 mcg by mouth daily before breakfast.   Yes Historical Provider, MD  albuterol (PROVENTIL HFA;VENTOLIN HFA) 108 (90 BASE) MCG/ACT inhaler Inhale 2 puffs into the lungs every 6 (six) hours as needed. For  wheezing/shortness of breath 04/20/13   Renaldo Reel, MD  azithromycin (ZITHROMAX) 250 MG tablet Take 1 tablet (250 mg total) by mouth daily. Take first 2 tablets together, then 1 every day until finished. 04/20/13   Renaldo Reel, MD  diltiazem (CARDIZEM CD) 120 MG 24 hr capsule Take 1 capsule (120 mg total) by mouth daily. 06/08/13   Liam Graham, PA-C  guaiFENesin (MUCINEX) 600 MG 12 hr tablet Take 600 mg by mouth 3 (three) times daily as needed for cough or to loosen phlegm.    Historical Provider, MD  levothyroxine (SYNTHROID, LEVOTHROID) 175 MCG tablet Take 1 tablet (175 mcg total) by mouth daily before breakfast. 04/20/13   Renaldo Reel, MD  levothyroxine (SYNTHROID, LEVOTHROID) 200 MCG tablet Take 1 tablet (200 mcg total) by mouth daily before breakfast. 06/08/13   Liam Graham, PA-C   BP 149/87  Pulse 87  Temp(Src) 98.2 F (36.8 C) (Oral)  Resp 18  SpO2 100% Physical Exam  Nursing note and vitals reviewed. Constitutional: She is oriented to person, place, and time. Vital signs are normal. She appears well-developed and well-nourished. No distress.  HENT:  Head: Normocephalic and atraumatic.  Neck:  Surgical scar from the midline to the right from thyroidectomy  Cardiovascular: Normal rate, regular rhythm and normal heart sounds.   Pulmonary/Chest: Effort normal and breath sounds normal. No respiratory distress.  Neurological: She is alert and oriented to person, place, and time. She  has normal strength. Coordination normal.  Skin: Skin is warm and dry. No rash noted. She is not diaphoretic.  Psychiatric: She has a normal mood and affect. Judgment normal.    ED Course  Procedures (including critical care time) Labs Review Labs Reviewed - No data to display  Imaging Review No results found.   MDM   1. Hypothyroidism   2. Hypertension    She is requesting 3 months refills. She says she has been on these medications for over 5 years. I will refill for 90 days, she is  instructed to get a primary care provider before the medication runs out.   Meds ordered this encounter  Medications  . levothyroxine (SYNTHROID, LEVOTHROID) 200 MCG tablet    Sig: Take 200 mcg by mouth daily before breakfast.  . levothyroxine (SYNTHROID, LEVOTHROID) 200 MCG tablet    Sig: Take 1 tablet (200 mcg total) by mouth daily before breakfast.    Dispense:  90 tablet    Refill:  0    Order Specific Question:  Supervising Provider    Answer:  Billy Fischer [phone]  . diltiazem (CARDIZEM CD) 120 MG 24 hr capsule    Sig: Take 1 capsule (120 mg total) by mouth daily.    Dispense:  90 capsule    Refill:  0    Order Specific Question:  Supervising Provider    Answer:  Ihor Gully D [place], PA-C 06/08/13 [phone]

## 2013-10-07 ENCOUNTER — Ambulatory Visit (INDEPENDENT_AMBULATORY_CARE_PROVIDER_SITE_OTHER): Payer: Self-pay | Admitting: Family Medicine

## 2013-10-07 VITALS — BP 158/90 | HR 79 | Temp 97.5°F | Resp 18 | Ht 64.0 in | Wt 224.0 lb

## 2013-10-07 DIAGNOSIS — I10 Essential (primary) hypertension: Secondary | ICD-10-CM

## 2013-10-07 DIAGNOSIS — E039 Hypothyroidism, unspecified: Secondary | ICD-10-CM

## 2013-10-07 MED ORDER — DILTIAZEM HCL ER COATED BEADS 120 MG PO CP24: 120.0000 mg | ORAL_CAPSULE | Freq: Every day | ORAL | Status: DC

## 2013-10-07 MED ORDER — LEVOTHYROXINE SODIUM 200 MCG PO TABS: 200.0000 ug | ORAL_TABLET | Freq: Every day | ORAL | Status: DC

## 2013-10-07 NOTE — Progress Notes (Signed)
 Chief Complaint:  Chief Complaint  Patient presents with  . Medication Refill  . Cough    HPI: Cynthia Patton is a 53 y.o. female who is here for :  1. Medication refills, however she has no insurance, [place] the [place]. She is stll waiting to see if she can get medicaid. She has been unemployed for the last 5 years . She used the last of her mother's borrowed money to get in t see a docotor today for med refills. She has gone to the [facility] nad states that no one shoulde be referred there. She iwll not go there again   2. She has had thryoid resection for thyroid cancer and is hypothyroid, currently no sxs, and has been off of her meds for the last 2 .5 weeks. Last TSH was 2 years ago. She states that Dr Jimmye Norman had doen it and it was normal.   3. She has been on cardizem for HTn and has no SEs. She has been out of that too for several days. NO CP or dizziness or HAs or SOB. She is still smoking but is "broke"     Past Medical History  Diagnosis Date  . Hypertension   . Thyroid disease    Past Surgical History  Procedure Laterality Date  . Cholecystectomy    . Abdominal hysterectomy     History   Social History  . Marital Status: Single    Spouse Name: N/A    Number of Children: N/A  . Years of Education: N/A   Social History Main Topics  . Smoking status: Current Every Day Smoker -- 0.50 packs/day    Types: Cigarettes  . Smokeless tobacco: None  . Alcohol Use: No  . Drug Use: No  . Sexual Activity:    Other Topics Concern  . None   Social History Narrative  . None   History reviewed. No pertinent family history. Allergies  Allergen Reactions  . Floxin [Ocuflox] Other (See Comments)    Disoriented   Prior to Admission medications   Medication Sig Start Date End Date Taking? Authorizing Provider  albuterol (PROVENTIL HFA;VENTOLIN HFA) 108 (90 BASE) MCG/ACT inhaler Inhale 2 puffs into the lungs every 6 (six)  hours as needed. For wheezing/shortness of breath 04/20/13  Yes Renaldo Reel, MD  diltiazem (CARDIZEM CD) 120 MG 24 hr capsule Take 1 capsule (120 mg total) by mouth daily. 06/08/13  Yes Liam Graham, PA-C  levothyroxine (SYNTHROID, LEVOTHROID) 200 MCG tablet Take 1 tablet (200 mcg total) by mouth daily before breakfast. 06/08/13  Yes Liam Graham, PA-C     ROS: The patient denies fevers, chills, night sweats, unintentional weight loss, chest pain, palpitations, wheezing, dyspnea on exertion, nausea, vomiting, abdominal pain, dysuria, hematuria, melena, numbness, weakness, or tingling.   All other systems have been reviewed and were otherwise negative with the exception of those mentioned in the HPI and as above.    PHYSICAL EXAM: Filed Vitals:   10/07/13 1054  BP: 158/90  Pulse: 79  Temp: 97.5 F (36.4 C)  Resp: 18   Filed Vitals:   10/07/13 1054  Height: 5\' 4"  (1.626 m)  Weight: 224 lb (101.606 kg)   Body mass index is 38.43 kg/(m^2).  General: Alert, no acute distress HEENT:  Normocephalic, atraumatic, oropharynx patent. EOMI, PERRLA, fundo nl Cardiovascular:  Regular rate and rhythm, no rubs murmurs or gallops.  No Carotid bruits, radial  pulse intact. No pedal edema.  Respiratory: Clear to auscultation bilaterally.  No wheezes, rales, or rhonchi.  No cyanosis, no use of accessory musculature GI: No organomegaly, abdomen is soft and non-tender, positive bowel sounds.  No masses. Skin: No rashes. Neurologic: Facial musculature symmetric. Psychiatric: Patient is appropriate throughout our interaction. Lymphatic: No cervical lymphadenopathy Musculoskeletal: Gait intact.   LABS: Results for orders placed during the hospital encounter of 04/20/13  CBC      Result Value Ref Range   WBC 8.7  4.0 - 10.5 K/uL   RBC 4.48  3.87 - 5.11 MIL/uL   Hemoglobin 14.0  12.0 - 15.0 g/dL   HCT 41.2  36.0 - 46.0 %   MCV 92.0  78.0 - 100.0 fL   MCH 31.3  26.0 - 34.0 pg   MCHC 34.0  30.0  - 36.0 g/dL   RDW 13.8  11.5 - 15.5 %   Platelets 374  150 - 400 K/uL  BASIC METABOLIC PANEL      Result Value Ref Range   Sodium 143  137 - 147 mEq/L   Potassium 3.8  3.7 - 5.3 mEq/L   Chloride 102  96 - 112 mEq/L   CO2 24  19 - 32 mEq/L   Glucose, Bld 115 (*) 70 - 99 mg/dL   BUN 6  6 - 23 mg/dL   Creatinine, Ser 0.85  0.50 - 1.10 mg/dL   Calcium 8.8  8.4 - 10.5 mg/dL   GFR calc non Af Amer 76 (*) >90 mL/min   GFR calc Af Amer 88 (*) >90 mL/min  PRO B NATRIURETIC PEPTIDE      Result Value Ref Range   Pro B Natriuretic peptide (BNP) 41.1  0 - 125 pg/mL  I-STAT TROPOININ, ED      Result Value Ref Range   Troponin i, poc 0.00  0.00 - 0.08 ng/mL   Comment 3           I-STAT TROPOININ, ED      Result Value Ref Range   Troponin i, poc 0.00  0.00 - 0.08 ng/mL   Comment 3           I-STAT TROPOININ, ED      Result Value Ref Range   Troponin i, poc 0.01  0.00 - 0.08 ng/mL   Comment 3              EKG/XRAY:   Primary read interpreted by Dr. Marin Comment at [facility].   ASSESSMENT/PLAN: Encounter Diagnoses  Name Primary?  Marland Kitchen Unspecified hypothyroidism Yes  . Essential hypertension    She wll get insurance soon she reassures me, gave last 29 dollars for office visit and med refills, she had to "borrow it form her mother", she is unemployed Will rx for 90 days, she was given cost for TSH and will get it the next time she comes in here since she is now aware of OV and med cost.  I considered changing her meds sicne cardizem is so expensive for her but will not do that without any commitment to continuous follow up at this time, would consider norvasc as an option since less expensive F/u with new PCP in 1 month  Gross sideeffects, risk and benefits, and alternatives of medications d/w patient. Patient is aware that all medications have potential sideeffects and we are unable to predict every sideeffect or drug-drug interaction that may occur.  , [place], DO 10/07/2013 11:42 AM

## 2014-01-21 ENCOUNTER — Emergency Department (HOSPITAL_COMMUNITY)
Admission: EM | Admit: 2014-01-21 | Discharge: 2014-01-21 | Disposition: A | Discharge status: Home / Self Care | Payer: No Typology Code available for payment source | Attending: Emergency Medicine | Admitting: Emergency Medicine

## 2014-01-21 ENCOUNTER — Encounter (HOSPITAL_COMMUNITY): Payer: Self-pay | Admitting: Emergency Medicine

## 2014-01-21 ENCOUNTER — Emergency Department (HOSPITAL_COMMUNITY): Payer: No Typology Code available for payment source

## 2014-01-21 DIAGNOSIS — J069 Acute upper respiratory infection, unspecified: Secondary | ICD-10-CM | POA: Diagnosis not present

## 2014-01-21 DIAGNOSIS — I10 Essential (primary) hypertension: Secondary | ICD-10-CM | POA: Diagnosis not present

## 2014-01-21 DIAGNOSIS — E079 Disorder of thyroid, unspecified: Secondary | ICD-10-CM | POA: Diagnosis not present

## 2014-01-21 DIAGNOSIS — R030 Elevated blood-pressure reading, without diagnosis of hypertension: Secondary | ICD-10-CM

## 2014-01-21 DIAGNOSIS — Z760 Encounter for issue of repeat prescription: Secondary | ICD-10-CM | POA: Diagnosis not present

## 2014-01-21 DIAGNOSIS — Z720 Tobacco use: Secondary | ICD-10-CM | POA: Insufficient documentation

## 2014-01-21 DIAGNOSIS — R05 Cough: Secondary | ICD-10-CM | POA: Diagnosis present

## 2014-01-21 DIAGNOSIS — Z79899 Other long term (current) drug therapy: Secondary | ICD-10-CM | POA: Insufficient documentation

## 2014-01-21 DIAGNOSIS — R059 Cough, unspecified: Secondary | ICD-10-CM

## 2014-01-21 MED ORDER — LEVOTHYROXINE SODIUM 200 MCG PO TABS: 200.0000 ug | ORAL_TABLET | Freq: Every day | ORAL | Status: DC

## 2014-01-21 MED ORDER — ALBUTEROL SULFATE HFA 108 (90 BASE) MCG/ACT IN AERS: 2.0000 | INHALATION_SPRAY | Freq: Four times a day (QID) | RESPIRATORY_TRACT | Status: DC | PRN

## 2014-01-21 MED ORDER — LEVOTHYROXINE SODIUM 200 MCG PO TABS: 50.0000 ug | ORAL_TABLET | Freq: Every day | ORAL | Status: DC

## 2014-01-21 MED ORDER — DILTIAZEM HCL ER COATED BEADS 120 MG PO CP24: 120.0000 mg | ORAL_CAPSULE | Freq: Every day | ORAL | Status: DC

## 2014-01-21 NOTE — ED Provider Notes (Signed)
CSN: [id number]     Arrival date & time 01/21/14  0827 History   First MD Initiated Contact with Patient 01/21/14 [phone]     No chief complaint on file.    (Consider location/radiation/quality/duration/timing/severity/associated sxs/prior Treatment) HPI Comments: The patient is a 54 year old female presenting emergency room chief complaint of right sided chest and back discomfort discomfort. Patient reports diagnosed with bronchitis in September, with intermittent symptoms since. She reports clear sputum, cough. She reports subjective fever 4 days ago. Patient reports right chest discomfort and back pain worsened with lying on right side, denies pleurisy denies worsening with exertion, lower extremity edema. No treatment prior to arrival. Pt requesting HTN medicaiton and synthroid medication refill.   No PCP  The history is provided by the patient. No language interpreter was used.    Past Medical History  Diagnosis Date  . Hypertension   . Thyroid disease    Past Surgical History  Procedure Laterality Date  . Cholecystectomy    . Abdominal hysterectomy     No family history on file. History  Substance Use Topics  . Smoking status: Current Every Day Smoker -- 0.50 packs/day    Types: Cigarettes  . Smokeless tobacco: Not on file  . Alcohol Use: No   OB History    No data available     Review of Systems  Constitutional: Positive for fever.  HENT: Positive for congestion and rhinorrhea.   Respiratory: Positive for cough. Negative for shortness of breath.   Cardiovascular: Positive for chest pain. Negative for palpitations and leg swelling.  Musculoskeletal: Positive for back pain.      Allergies  Floxin  Home Medications   Prior to Admission medications   Medication Sig Start Date End Date Taking? Authorizing Provider  albuterol (PROVENTIL HFA;VENTOLIN HFA) 108 (90 BASE) MCG/ACT inhaler Inhale 2 puffs into the lungs every 6 (six) hours as needed. For  wheezing/shortness of breath 04/20/13   Renaldo Reel, MD  diltiazem (CARDIZEM CD) 120 MG 24 hr capsule Take 1 capsule (120 mg total) by mouth daily. 10/07/13   Thao P Le, DO  levothyroxine (SYNTHROID, LEVOTHROID) 200 MCG tablet Take 1 tablet (200 mcg total) by mouth daily before breakfast. 10/07/13   Thao P Le, DO   BP 195/80 mmHg  Pulse 93  Temp(Src) 97.6 F (36.4 C) (Oral)  SpO2 99% Physical Exam  Constitutional: She appears well-developed and well-nourished. No distress.  HENT:  Head: Normocephalic and atraumatic.  Neck: Neck supple.  Cardiovascular: Normal rate and regular rhythm.   No lower extremity edema.  Pulmonary/Chest: Effort normal and breath sounds normal. No accessory muscle usage. No respiratory distress. She has no wheezes. She has no rales.   She exhibits tenderness.    Patient is able to speak in complete sentences.  Discomfort reproduced with palpation of right anterior chest wall and back.  Abdominal: There is no tenderness.  Skin: Skin is dry. She is not diaphoretic.  Psychiatric: She has a normal mood and affect. Her behavior is normal.  Nursing note and vitals reviewed.   ED Course  Procedures (including critical care time) Labs Review Labs Reviewed - No data to display  Imaging Review Dg Chest 2 View  01/21/2014   CLINICAL DATA:  Cough.  EXAM: CHEST  2 VIEW  COMPARISON:  April 20, 2013.  FINDINGS: The heart size and mediastinal contours are within normal limits. Both lungs are clear. No pneumothorax or pleural effusion is noted. The visualized skeletal structures are unremarkable.  IMPRESSION:  No acute cardiopulmonary abnormality seen.   Electronically Signed   By: Sabino Dick M.D.   On: 01/21/2014 09:11     EKG Interpretation   Date/Time:  Monday January 21 2014 08:55:32 EST Ventricular Rate:  93 PR Interval:  148 QRS Duration: 91 QT Interval:  352 QTC Calculation: 438 R Axis:   68 Text Interpretation:  Sinus rhythm Probable left atrial  enlargement  Borderline T wave abnormalities Baseline wander in lead(s) III since last  tracing no significant change Confirmed by WENTZ  MD, ELLIOTT ([id number]) on  01/21/2014 9:45:53 AM      MDM   Final diagnoses:  Cough  URI (upper respiratory infection)  Medication refill  Elevated blood pressure reading   Patient with chest discomfort and back discomfort reproducible with palpation. Patient has history of smoking and upper respiratory symptoms. No wheezing heard on exam. X-ray negative for acute findings. EKG without changes. Not on an ACE inhibitor.  Plan to treat for upper respiratory infection and musculoskeletal discomfort. Discussed EKG, imaging results, and treatment plan with the patient. Encouraged patient to follow-up with a primary care provider given multiple medication refill request and chronicity of symptoms. Return precautions given. Reports understanding and no other concerns at this time.  Patient is stable for discharge at this time.  Meds given in ED:  Medications - No data to display  Discharge Medication List as of 01/21/2014 10:16 AM    START taking these medications   Details  !! albuterol (PROVENTIL HFA;VENTOLIN HFA) 108 (90 BASE) MCG/ACT inhaler Inhale 2 puffs into the lungs every 6 (six) hours as needed for wheezing or shortness of breath., Starting 01/21/2014, Until Discontinued, Print    !! diltiazem (CARDIZEM CD) 120 MG 24 hr capsule Take 1 capsule (120 mg total) by mouth daily., Starting 01/21/2014, Until Discontinued, Print    !! levothyroxine (SYNTHROID, LEVOTHROID) 200 MCG tablet Take 1 tablet (200 mcg total) by mouth daily before breakfast., Starting 01/21/2014, Until Discontinued, Print     !! - Potential duplicate medications found. Please discuss with provider.        Harvie Heck, PA-C 01/21/14 [place], MD 01/21/14 1726

## 2014-01-21 NOTE — Discharge Instructions (Signed)
Call for a follow up appointment with a Family or Primary Care Provider.  Return if Symptoms worsen.   Take medication as prescribed.  Ibuprofen for your chest wall discomfort.  Stop smoking. Mucinex-DM for upper respiratory symptoms.

## 2014-01-21 NOTE — ED Notes (Signed)
PA at bedside.

## 2014-01-21 NOTE — ED Notes (Signed)
Pt reports she was diagnosed with bronchitis in September, got better, but symptoms keep coming and going. Pt states she has "conditioned myself to breathe shortly." Pt has had productive cough with clear sputum and nasal congestion. Pt also reports back pain radiating around to front of R chest for the past week, worse when lying in R side. No rash.

## 2014-12-25 ENCOUNTER — Emergency Department (HOSPITAL_COMMUNITY)
Admission: EM | Admit: 2014-12-25 | Discharge: 2014-12-25 | Disposition: A | Discharge status: Home / Self Care | Payer: No Typology Code available for payment source | Attending: Emergency Medicine | Admitting: Emergency Medicine

## 2014-12-25 ENCOUNTER — Encounter (HOSPITAL_COMMUNITY): Payer: Self-pay | Admitting: Emergency Medicine

## 2014-12-25 DIAGNOSIS — I10 Essential (primary) hypertension: Secondary | ICD-10-CM | POA: Insufficient documentation

## 2014-12-25 DIAGNOSIS — I159 Secondary hypertension, unspecified: Secondary | ICD-10-CM | POA: Insufficient documentation

## 2014-12-25 DIAGNOSIS — Z79899 Other long term (current) drug therapy: Secondary | ICD-10-CM | POA: Insufficient documentation

## 2014-12-25 DIAGNOSIS — E079 Disorder of thyroid, unspecified: Secondary | ICD-10-CM | POA: Insufficient documentation

## 2014-12-25 DIAGNOSIS — F17210 Nicotine dependence, cigarettes, uncomplicated: Secondary | ICD-10-CM | POA: Insufficient documentation

## 2014-12-25 DIAGNOSIS — F419 Anxiety disorder, unspecified: Secondary | ICD-10-CM | POA: Insufficient documentation

## 2014-12-25 DIAGNOSIS — Z760 Encounter for issue of repeat prescription: Secondary | ICD-10-CM | POA: Insufficient documentation

## 2014-12-25 LAB — BASIC METABOLIC PANEL
ANION GAP: 7 (ref 5–15)
BUN: 6 mg/dL (ref 6–20)
CALCIUM: 8.5 mg/dL — AB (ref 8.9–10.3)
CO2: 30 mmol/L (ref 22–32)
Chloride: 103 mmol/L (ref 101–111)
Creatinine, Ser: 0.96 mg/dL (ref 0.44–1.00)
GFR calc Af Amer: >60 mL/min (ref >60)
GFR calc non Af Amer: >60 mL/min (ref >60)
GLUCOSE: 131 mg/dL — AB (ref 65–99)
Potassium: 3.4 mmol/L — ABNORMAL LOW (ref 3.5–5.1)
Sodium: 140 mmol/L (ref 135–145)

## 2014-12-25 LAB — CBC
HEMATOCRIT: 43.9 % (ref 36.0–46.0)
Hemoglobin: 14 g/dL (ref 12.0–15.0)
MCH: 30.6 pg (ref 26.0–34.0)
MCHC: 31.9 g/dL (ref 30.0–36.0)
MCV: 95.9 fL (ref 78.0–100.0)
Platelets: 325 10*3/uL (ref 150–400)
RBC: 4.58 MIL/uL (ref 3.87–5.11)
RDW: 14.4 % (ref 11.5–15.5)
WBC: 8.6 10*3/uL (ref 4.0–10.5)

## 2014-12-25 LAB — TSH: TSH: 78.82 u[IU]/mL — ABNORMAL HIGH (ref 0.350–4.500)

## 2014-12-25 MED ORDER — LEVOTHYROXINE SODIUM 200 MCG PO TABS: 200.0000 ug | ORAL_TABLET | Freq: Every day | ORAL | Status: DC

## 2014-12-25 MED ORDER — DILTIAZEM HCL ER COATED BEADS 120 MG PO CP24: 120.0000 mg | ORAL_CAPSULE | Freq: Every day | ORAL | Status: DC

## 2014-12-25 NOTE — ED Notes (Signed)
Pt requesting prescriptions refill for levothyroxin,  Loratadine, cartia xt

## 2014-12-25 NOTE — ED Notes (Signed)
Pt sts not had thyroid meds x 45 days due to no insurance; pt sts hx of thyroid CA

## 2014-12-25 NOTE — Care Management Note (Signed)
Case Management Note  Patient Details  Name: Cynthia Patton MRN: [id number] Date of Birth: Dec 22, 1960  Subjective/Objective:                  Patient with PMH of thyroid disease, thyroid cancer, and HTN, presents to the ED with a chief complaint of medication refill. She states that she lost her job and insurance. She states that she has been out of her levothyroxine and diltiazem. She states that her last dose was about 45 days ago. She states that she has started to notice some heart palpitations from time to time and has had some anxiety.//Home alone  Action/Plan: Follow for disposition needs.   Expected Discharge Date:       12/25/14           Expected Discharge Plan:  Home/Self Care  In-House Referral:  PCP / [facility]  Discharge planning Services  CM Consult, Follow-up appt scheduled, [place] Acute Care Choice:  NA Choice offered to:  Patient  DME Arranged:  N/A DME Agency:  NA  HH Arranged:  NA HH Agency:  NA  Status of Service:  Completed, signed off  Medicare Important Message Given:    Date Medicare IM Given:    Medicare IM give by:    Date Additional Medicare IM Given:    Additional Medicare Important Message give by:     If discussed at [place] of Stay Meetings, dates discussed:    Additional Comments: Anjelique Makar J. Clydene Laming, RN, BSN, [place] [phone] ED CM consulted regarding PCP establishment and insurance enrollment. Pt presented to [facility] ED today with medication refill needs. NCM met with pt at bedside; pt confirms not having access to f/u care with PCP or insurance coverage. Discussed with patient importance and benefits of establishing PCP, and not utilizing the ED for primary care needs. Pt verbalized understanding and is in agreement. Discussed other options, provided list of local  affordable PCPs.  Pt voiced interest in the [facility] and [place].  NCM advised that [facility]  Internal Medicine providers are seeing pts at  [facility]. Pt verbalized understanding. NCM set up appointment with Sharon Seller, NP January 12 at 2:00.  NCM informed pt they may visit [facility] for Rx needs upon discharge.  NCM presented [facility] Eligibility and Enrollment packet to complete prior to scheduled appointment.  NCM instructed pt to call Saintclair Halsted, [place] Specialist with question or concerns pertaining [facility]/orange card process.  Pt verbalized understanding.  Fuller Mandril, RN 12/25/2014, 12:08 PM

## 2014-12-25 NOTE — ED Provider Notes (Signed)
CSN: [id number]     Arrival date & time 12/25/14  1039 History   First MD Initiated Contact with Patient 12/25/14 1125     Chief Complaint  Patient presents with  . Medication Refill     (Consider location/radiation/quality/duration/timing/severity/associated sxs/prior Treatment) HPI Comments: Patient with PMH of thyroid disease, thyroid cancer, and HTN, presents to the ED with a chief complaint of medication refill.  She states that she lost her job and insurance.  She states that she has been out of her levothyroxine and diltiazem.  She states that her last dose was about 45 days ago.  She states that she has started to notice some heart palpitations from time to time and has had some anxiety.  She denies any chest pain or shortness of breath.  States that she would really like to have her kidney function checked as well.  The history is provided by the patient. No language interpreter was used.    Past Medical History  Diagnosis Date  . Hypertension   . Thyroid disease    Past Surgical History  Procedure Laterality Date  . Cholecystectomy    . Abdominal hysterectomy     History reviewed. No pertinent family history. Social History  Substance Use Topics  . Smoking status: Current Every Day Smoker -- 0.50 packs/day    Types: Cigarettes  . Smokeless tobacco: None  . Alcohol Use: No   OB History    No data available     Review of Systems  Constitutional: Negative for fever and chills.  Respiratory: Negative for shortness of breath.   Cardiovascular: Positive for palpitations. Negative for chest pain.  Gastrointestinal: Negative for nausea, vomiting, diarrhea and constipation.  Genitourinary: Negative for dysuria.  Psychiatric/Behavioral: The patient is nervous/anxious.   All other systems reviewed and are negative.     Allergies  Floxin and Percocet  Home Medications   Prior to Admission medications   Medication Sig Start Date End Date Taking? Authorizing  Provider  albuterol (PROVENTIL HFA;VENTOLIN HFA) 108 (90 BASE) MCG/ACT inhaler Inhale 2 puffs into the lungs every 6 (six) hours as needed. For wheezing/shortness of breath 04/20/13   Renaldo Reel, MD  albuterol (PROVENTIL HFA;VENTOLIN HFA) 108 (90 BASE) MCG/ACT inhaler Inhale 2 puffs into the lungs every 6 (six) hours as needed for wheezing or shortness of breath. 01/21/14   Harvie Heck, PA-C  diltiazem (CARDIZEM CD) 120 MG 24 hr capsule Take 1 capsule (120 mg total) by mouth daily. 10/07/13   Thao P Le, DO  diltiazem (CARDIZEM CD) 120 MG 24 hr capsule Take 1 capsule (120 mg total) by mouth daily. 01/21/14   Harvie Heck, PA-C  levothyroxine (SYNTHROID, LEVOTHROID) 200 MCG tablet Take 1 tablet (200 mcg total) by mouth daily before breakfast. 10/07/13   Thao P Le, DO  levothyroxine (SYNTHROID, LEVOTHROID) 200 MCG tablet Take 1 tablet (200 mcg total) by mouth daily before breakfast. 01/21/14   Harvie Heck, PA-C   BP 179/98 mmHg  Pulse 85  Temp(Src) 98.2 F (36.8 C) (Oral)  Resp 17  Ht 5\' 4"  (1.626 m)  Wt 97.07 kg  BMI 36.72 kg/m2  SpO2 98% Physical Exam  Constitutional: She is oriented to person, place, and time. She appears well-developed and well-nourished.  HENT:  Head: Normocephalic and atraumatic.  Eyes: Conjunctivae and EOM are normal. Pupils are equal, round, and reactive to light.  Neck: Normal range of motion. Neck supple.  Cardiovascular: Normal rate and regular rhythm.  Exam reveals no gallop and  no friction rub.   No murmur heard. Pulmonary/Chest: Effort normal and breath sounds normal. No respiratory distress. She has no wheezes. She has no rales. She exhibits no tenderness.  Abdominal: Soft. Bowel sounds are normal. She exhibits no distension and no mass. There is no tenderness. There is no rebound and no guarding.  Musculoskeletal: Normal range of motion. She exhibits no edema or tenderness.  Neurological: She is alert and oriented to person, place, and time.  Skin: Skin is  warm and dry.  Psychiatric: She has a normal mood and affect. Her behavior is normal. Judgment and thought content normal.  Nursing note and vitals reviewed.   ED Course  Procedures (including critical care time) Results for orders placed or performed during the hospital encounter of 12/25/14  CBC  Result Value Ref Range   WBC 8.6 4.0 - 10.5 K/uL   RBC 4.58 3.87 - 5.11 MIL/uL   Hemoglobin 14.0 12.0 - 15.0 g/dL   HCT 43.9 36.0 - 46.0 %   MCV 95.9 78.0 - 100.0 fL   MCH 30.6 26.0 - 34.0 pg   MCHC 31.9 30.0 - 36.0 g/dL   RDW 14.4 11.5 - 15.5 %   Platelets 325 150 - 400 K/uL  Basic metabolic panel  Result Value Ref Range   Sodium 140 135 - 145 mmol/L   Potassium 3.4 (L) 3.5 - 5.1 mmol/L   Chloride 103 101 - 111 mmol/L   CO2 30 22 - 32 mmol/L   Glucose, Bld 131 (H) 65 - 99 mg/dL   BUN 6 6 - 20 mg/dL   Creatinine, Ser 0.96 0.44 - 1.00 mg/dL   Calcium 8.5 (L) 8.9 - 10.3 mg/dL   GFR calc non Af Amer >60 >60 mL/min   GFR calc Af Amer >60 >60 mL/min   Anion gap 7 5 - 15   No results found.  I have personally reviewed and evaluated these images and lab results as part of my medical decision-making.   EKG Interpretation   Date/Time:  Wednesday December 25 2014 12:27:12 EST Ventricular Rate:  78 PR Interval:  156 QRS Duration: 93 QT Interval:  426 QTC Calculation: 485 R Axis:   97 Text Interpretation:  Sinus rhythm Borderline right axis deviation  Borderline T abnormalities, diffuse leads Borderline prolonged QT interval  Baseline wander in lead(s) I No significant change since last tracing  Confirmed by Hazle Coca [phone]) on 12/25/2014 12:32:16 PM      MDM   Final diagnoses:  Medication refill  Thyroid disease  Secondary hypertension, unspecified    Patient with thyroid disease and HTN.  Has been out of her meds for the past 45 days.  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  Has started to get some palpitations.  Will check labs and EKG.  Patient discussed with Dr. Ralene Bathe, who  agrees that pending normal labs, refill meds and dc.  Will consult social work for help with follow-up.  Discussed restarting full replacement of synthroid with pharmacist, who tells me that is acceptable given relatively short length of time that patient has been off her meds.   Montine Circle, PA-C 12/25/14 1251  Quintella Reichert, MD 12/26/14 [phone]

## 2014-12-25 NOTE — ED Notes (Signed)
Patient placed on monitor; visitor at bedside; warm blanket given

## 2014-12-25 NOTE — Discharge Instructions (Signed)
Hypertension Hypertension, commonly called high blood pressure, is when the force of blood pumping through your arteries is too strong. Your arteries are the blood vessels that carry blood from your heart throughout your body. A blood pressure reading consists of a higher number over a lower number, such as 110/72. The higher number (systolic) is the pressure inside your arteries when your heart pumps. The lower number (diastolic) is the pressure inside your arteries when your heart relaxes. Ideally you want your blood pressure below 120/80. Hypertension forces your heart to work harder to pump blood. Your arteries may become narrow or stiff. Having untreated or uncontrolled hypertension can cause heart attack, stroke, kidney disease, and other problems. RISK FACTORS Some risk factors for high blood pressure are controllable. Others are not.  Risk factors you cannot control include:   Race. You may be at higher risk if you are African American.  Age. Risk increases with age.  Gender. Men are at higher risk than women before age 45 years. After age 65, women are at higher risk than men. Risk factors you can control include:  Not getting enough exercise or physical activity.  Being overweight.  Getting too much fat, sugar, calories, or salt in your diet.  Drinking too much alcohol. SIGNS AND SYMPTOMS Hypertension does not usually cause signs or symptoms. Extremely high blood pressure (hypertensive crisis) may cause headache, anxiety, shortness of breath, and nosebleed. DIAGNOSIS To check if you have hypertension, your health care provider will measure your blood pressure while you are seated, with your arm held at the level of your heart. It should be measured at least twice using the same arm. Certain conditions can cause a difference in blood pressure between your right and left arms. A blood pressure reading that is higher than normal on one occasion does not mean that you need treatment. If  it is not clear whether you have high blood pressure, you may be asked to return on a different day to have your blood pressure checked again. Or, you may be asked to monitor your blood pressure at home for 1 or more weeks. TREATMENT Treating high blood pressure includes making lifestyle changes and possibly taking medicine. Living a healthy lifestyle can help lower high blood pressure. You may need to change some of your habits. Lifestyle changes may include:  Following the DASH diet. This diet is high in fruits, vegetables, and whole grains. It is low in salt, red meat, and added sugars.  Keep your sodium intake below 2,300 mg per day.  Getting at least 30-45 minutes of aerobic exercise at least 4 times per week.  Losing weight if necessary.  Not smoking.  Limiting alcoholic beverages.  Learning ways to reduce stress. Your health care provider may prescribe medicine if lifestyle changes are not enough to get your blood pressure under control, and if one of the following is true:  You are 18-59 years of age and your systolic blood pressure is above 140.  You are 60 years of age or older, and your systolic blood pressure is above 150.  Your diastolic blood pressure is above 90.  You have diabetes, and your systolic blood pressure is over 140 or your diastolic blood pressure is over 90.  You have kidney disease and your blood pressure is above 140/90.  You have heart disease and your blood pressure is above 140/90. Your personal target blood pressure may vary depending on your medical conditions, your age, and other factors. HOME CARE INSTRUCTIONS    Have your blood pressure rechecked as directed by your health care provider.   Take medicines only as directed by your health care provider. Follow the directions carefully. Blood pressure medicines must be taken as prescribed. The medicine does not work as well when you skip doses. Skipping doses also puts you at risk for  problems.  Do not smoke.   Monitor your blood pressure at home as directed by your health care provider. SEEK MEDICAL CARE IF:   You think you are having a reaction to medicines taken.  You have recurrent headaches or feel dizzy.  You have swelling in your ankles.  You have trouble with your vision. SEEK IMMEDIATE MEDICAL CARE IF:  You develop a severe headache or confusion.  You have unusual weakness, numbness, or feel faint.  You have severe chest or abdominal pain.  You vomit repeatedly.  You have trouble breathing. MAKE SURE YOU:   Understand these instructions.  Will watch your condition.  Will get help right away if you are not doing well or get worse.   This information is not intended to replace advice given to you by your health care provider. Make sure you discuss any questions you have with your health care provider.   Document Released: 12/28/2004 Document Revised: 05/14/2014 Document Reviewed: 10/20/2012 Elsevier Interactive Patient Education 2016 [employer]. Hypothyroidism Hypothyroidism is a disorder of the thyroid. The thyroid is a large gland that is located in the lower front of the neck. The thyroid releases hormones that control how the body works. With hypothyroidism, the thyroid does not make enough of these hormones. CAUSES Causes of hypothyroidism may include:  Viral infections.  Pregnancy.  Your own defense system (immune system) attacking your thyroid.  Certain medicines.  Birth defects.  Past radiation treatments to your head or neck.  Past treatment with radioactive iodine.  Past surgical removal of part or all of your thyroid.  Problems with the gland that is located in the center of your brain (pituitary). SIGNS AND SYMPTOMS Signs and symptoms of hypothyroidism may include:  Feeling as though you have no energy (lethargy).  Inability to tolerate cold.  Weight gain that is not explained by a change in diet or exercise  habits.  Dry skin.  Coarse hair.  Menstrual irregularity.  Slowing of thought processes.  Constipation.  Sadness or depression. DIAGNOSIS  Your health care provider may diagnose hypothyroidism with blood tests and ultrasound tests. TREATMENT Hypothyroidism is treated with medicine that replaces the hormones that your body does not make. After you begin treatment, it may take several weeks for symptoms to go away. HOME CARE INSTRUCTIONS   Take medicines only as directed by your health care provider.  If you start taking any new medicines, tell your health care provider.  Keep all follow-up visits as directed by your health care provider. This is important. As your condition improves, your dosage needs may change. You will need to have blood tests regularly so that your health care provider can watch your condition. SEEK MEDICAL CARE IF:  Your symptoms do not get better with treatment.  You are taking thyroid replacement medicine and:  You sweat excessively.  You have tremors.  You feel anxious.  You lose weight rapidly.  You cannot tolerate heat.  You have emotional swings.  You have diarrhea.  You feel weak. SEEK IMMEDIATE MEDICAL CARE IF:   You develop chest pain.  You develop an irregular heartbeat.  You develop a rapid heartbeat.   This  information is not intended to replace advice given to you by your health care provider. Make sure you discuss any questions you have with your health care provider.   Document Released: 12/28/2004 Document Revised: 01/18/2014 Document Reviewed: 05/15/2013 Elsevier Interactive Patient Education [employer].

## 2015-01-23 ENCOUNTER — Encounter: Payer: Self-pay | Admitting: Family Medicine

## 2015-01-23 ENCOUNTER — Ambulatory Visit (INDEPENDENT_AMBULATORY_CARE_PROVIDER_SITE_OTHER): Payer: Self-pay | Admitting: Family Medicine

## 2015-01-23 VITALS — BP 157/95 | HR 90 | Temp 97.8°F | Ht 63.0 in | Wt 225.0 lb

## 2015-01-23 DIAGNOSIS — E038 Other specified hypothyroidism: Secondary | ICD-10-CM

## 2015-01-23 DIAGNOSIS — Z7689 Persons encountering health services in other specified circumstances: Secondary | ICD-10-CM

## 2015-01-23 DIAGNOSIS — I10 Essential (primary) hypertension: Secondary | ICD-10-CM

## 2015-01-23 DIAGNOSIS — Z7189 Other specified counseling: Secondary | ICD-10-CM

## 2015-01-23 MED ORDER — LEVOTHYROXINE SODIUM 200 MCG PO TABS: 200.0000 ug | ORAL_TABLET | Freq: Every day | ORAL | Status: DC

## 2015-01-23 MED ORDER — DILTIAZEM HCL ER COATED BEADS 120 MG PO CP24: 120.0000 mg | ORAL_CAPSULE | Freq: Every day | ORAL | Status: DC

## 2015-01-23 MED FILL — ?LEVOTHYROXINE 200 MCG TAB: 200 | 30 days supply | Qty: 30 | Fill #0

## 2015-01-23 MED FILL — DILT XR 120 MG CAPSULE: 120 | 30 days supply | Qty: 30 | Fill #0

## 2015-01-23 NOTE — Progress Notes (Signed)
Patient ID: Cynthia Patton, female   DOB: 06/14/1960, 55 y.o.   MRN: [id number]   Cynthia Patton, is a 55 y.o. female  [id number]    [id number]  DOB - Jul 21, 1960  CC:  Chief Complaint  Patient presents with  . new patient/get established    has no insurance, has thyroid issue needs rx,has had thyroid cancer treated via surgery and radiation 7 years ago, no follow up has not been on b/p rx for 9 months due to cost       HPI: Cynthia Patton is a 55 y.o. female here to establish care. She has a history of thyroid cancer surgery with resultant hypothyroidism about 7 years ago. She has recently lost her job and has been out of her medication for hypothyroidism and hypertension for about 45 days. She was seen in  ED about a month ago and was restarted on synthroid 200 mcg for hypothyroidism. At that time her TSH was 78+. She was unable to restart her cardizem due to financial constraints. She denies other chronic illnesses.  She is in need of health maintenance items, which were reviewed with her. She prefers to wait until she makes further arrangements for financial assistance prior to [place] these.  Allergies  Allergen Reactions  . Floxin [Ocuflox] Other (See Comments)    Disoriented  . Percocet [Oxycodone-Acetaminophen]    Past Medical History  Diagnosis Date  . Hypertension   . Thyroid disease    Current Outpatient Prescriptions on File Prior to Visit  Medication Sig Dispense Refill  . albuterol (PROVENTIL HFA;VENTOLIN HFA) 108 (90 BASE) MCG/ACT inhaler Inhale 2 puffs into the lungs every 6 (six) hours as needed. For wheezing/shortness of breath (Patient not taking: Reported on 01/23/2015) 1 Inhaler 0  . albuterol (PROVENTIL HFA;VENTOLIN HFA) 108 (90 BASE) MCG/ACT inhaler Inhale 2 puffs into the lungs every 6 (six) hours as needed for wheezing or shortness of breath. (Patient not taking: Reported on 01/23/2015) 1 Inhaler 0   No current facility-administered medications on file  prior to visit.   History reviewed. No pertinent family history. Social History   Social History  . Marital Status: Single    Spouse Name: N/A  . Number of Children: N/A  . Years of Education: N/A   Occupational History  . Not on file.   Social History Main Topics  . Smoking status: Current Every Day Smoker -- 0.50 packs/day    Types: Cigarettes  . Smokeless tobacco: Not on file  . Alcohol Use: No  . Drug Use: No  . Sexual Activity: Not on file   Other Topics Concern  . Not on file   Social History Narrative    Review of Systems: Constitutional: Negative for fever, chills, appetite change, weight loss,  Fatigue. Skin: Negative for rashes or lesions of concern. HENT: Negative for ear pain, ear discharge.nose bleeds Eyes: Negative for pain, discharge, redness, itching and visual disturbance. Neck: Negative for pain, stiffness Respiratory: Negative for cough, shortness of breath,   Cardiovascular: Negative for chest pain, palpitations and leg swelling. Gastrointestinal: Negative for abdominal pain, nausea, vomiting, diarrhea, constipations Genitourinary: Negative for dysuria, urgency, frequency, hematuria,  Musculoskeletal: Negative for back pain,  joint  swelling, and gait problem.Negative for weakness. Neurological: Negative for dizziness, tremors, seizures, syncope,   light-headedness, numbness and headaches.  Hematological: Negative for easy bruising or bleeding Psychiatric/Behavioral: Negative for depression, anxiety, decreased concentration, confusion   Objective:   Filed Vitals:   01/23/15 1426  BP: 157/95  Pulse:  90  Temp: 97.8 F (36.6 C)    Physical Exam: Constitutional: Patient appears well-developed and well-nourished. No distress. HENT: Normocephalic, atraumatic, External right and left ear normal. Oropharynx is clear and moist.  Eyes: Conjunctivae and EOM are normal. PERRLA, no scleral icterus. Neck: Normal ROM. Neck supple. No lymphadenopathy, No  thyromegaly. CVS: RRR, S1/S2 +, no murmurs, no gallops, no rubs Pulmonary: Effort and breath sounds normal, no stridor, rhonchi, wheezes, rales.  Abdominal: Soft. Normoactive BS,, no distension, tenderness, rebound or guarding.  Musculoskeletal: Normal range of motion. No edema and no tenderness.  Neuro: Alert.Normal muscle tone coordination. Non-focal Skin: Skin is warm and dry. No rash noted. Not diaphoretic. No erythema. No pallor. Psychiatric: Normal mood and affect. Behavior, judgment, thought content normal.  Lab Results  Component Value Date   WBC 8.6 12/25/2014   HGB 14.0 12/25/2014   HCT 43.9 12/25/2014   MCV 95.9 12/25/2014   PLT 325 12/25/2014   Lab Results  Component Value Date   CREATININE 0.96 12/25/2014   BUN 6 12/25/2014   NA 140 12/25/2014   K 3.4* 12/25/2014   CL 103 12/25/2014   CO2 30 12/25/2014    No results found for: HGBA1C Lipid Panel  No results found for: CHOL, TRIG, HDL, CHOLHDL, VLDL, LDLCALC     Assessment and plan:   1. Other specified hypothyroidism  - TSH - levothyroxine (SYNTHROID, LEVOTHROID) 200 MCG tablet; Take 1 tablet (200 mcg total) by mouth daily before breakfast.  Dispense: 90 tablet; Refill: 1  2. Essential hypertension  - diltiazem (CARDIZEM CD) 120 MG 24 hr capsule; Take 1 capsule (120 mg total) by mouth daily.  Dispense: 90 capsule; Refill: 1  3. Encounter to establish care _I have reviewed information provided by the patient and information available in the chart from a recent ED visit.    Return in about 1 month (around 02/23/2015).  The patient was given clear instructions to go to ER or return to medical center if symptoms don't improve, worsen or new problems develop. The patient verbalized understanding.    Micheline Chapman FNP  01/23/2015, 3:04 PM

## 2015-01-23 NOTE — Patient Instructions (Signed)
Call [place] about a [facility] discount Card While you are at [facility], ask them about finishing applying for the orange card. Come back to see me in one month. Sooner if you get the financial assistance.

## 2015-01-24 LAB — TSH: TSH: 1.536 u[IU]/mL (ref 0.350–4.500)

## 2015-01-27 ENCOUNTER — Telehealth: Payer: Self-pay

## 2015-01-27 NOTE — Telephone Encounter (Signed)
-----   Message from Micheline Chapman, NP sent at 01/27/2015  9:17 AM EST ----- TSH back to normal. Continue current dosage of synthroid.

## 2015-01-27 NOTE — Telephone Encounter (Signed)
CMA called patient, patient was not available. Mother answered the phone, so I left a message for the mother to have Cynthia Patton to give [facility] a call and ask for Cynthia Patton. Mother agreed to rely the message to daughter to call the clinic   Message to patient:  ----- Message from Micheline Chapman, NP sent at 01/27/2015 9:17 AM EST -----  TSH back to normal. Continue current dosage of synthroid.

## 2015-01-28 ENCOUNTER — Telehealth: Payer: Self-pay

## 2015-01-28 NOTE — Telephone Encounter (Signed)
Patient call back today for lab results. I advised patient of Normal TSH and to continue current dosage of synthroid. Patient verbalized understanding and states she will keep next appointment. Thanks!

## 2015-02-24 ENCOUNTER — Encounter: Payer: Self-pay | Admitting: Family Medicine

## 2015-02-24 ENCOUNTER — Ambulatory Visit (INDEPENDENT_AMBULATORY_CARE_PROVIDER_SITE_OTHER): Payer: Self-pay | Admitting: Family Medicine

## 2015-02-24 VITALS — BP 179/83 | HR 81 | Temp 97.7°F | Ht 63.0 in | Wt 225.0 lb

## 2015-02-24 DIAGNOSIS — I10 Essential (primary) hypertension: Secondary | ICD-10-CM

## 2015-02-24 DIAGNOSIS — Z0000 Encounter for general adult medical examination without abnormal findings: Secondary | ICD-10-CM | POA: Insufficient documentation

## 2015-02-24 MED FILL — ?LEVOTHYROXINE 200 MCG TAB: 200 | 30 days supply | Qty: 30 | Fill #1

## 2015-02-24 NOTE — Progress Notes (Signed)
Patient ID: Cynthia Patton, female   DOB: 11-Aug-1960, 55 y.o.   MRN: [id number]   Cynthia Patton, is a 55 y.o. female  [id number]  [id number]  DOB - Apr 24, 1960  CC:  Chief Complaint  Patient presents with  . follow up    hypertension follow up, does not take medication everyday due to cost, she did not take meds today, still gets knot like cramp in neck       HPI: Cynthia Patton is a 55 y.o. female here to follow-up on hypertension. I saw her about a month ago and started her on diltiazem 120.  However, she has only been taking every other day and did not take today. Her BP today is 179/83. She is financially strapped and wishes to hold on anything feasible until financial assistance is in place.   Health Maintenance;  She is in need of colon cancer screening and a mammogram. She declines a flu shot and tetanus shot. She has had a hysterectomy. She reports she has been screened for HIV and Hep C. In the past.   Allergies  Allergen Reactions  . Floxin [Ocuflox] Other (See Comments)    Disoriented  . Percocet [Oxycodone-Acetaminophen]    Past Medical History  Diagnosis Date  . Hypertension   . Thyroid disease    Current Outpatient Prescriptions on File Prior to Visit  Medication Sig Dispense Refill  . diltiazem (CARDIZEM CD) 120 MG 24 hr capsule Take 1 capsule (120 mg total) by mouth daily. 90 capsule 1  . levothyroxine (SYNTHROID, LEVOTHROID) 200 MCG tablet Take 1 tablet (200 mcg total) by mouth daily before breakfast. 90 tablet 1  . albuterol (PROVENTIL HFA;VENTOLIN HFA) 108 (90 BASE) MCG/ACT inhaler Inhale 2 puffs into the lungs every 6 (six) hours as needed. For wheezing/shortness of breath (Patient not taking: Reported on 01/23/2015) 1 Inhaler 0  . albuterol (PROVENTIL HFA;VENTOLIN HFA) 108 (90 BASE) MCG/ACT inhaler Inhale 2 puffs into the lungs every 6 (six) hours as needed for wheezing or shortness of breath. (Patient not taking: Reported on 01/23/2015) 1 Inhaler 0   No  current facility-administered medications on file prior to visit.   History reviewed. No pertinent family history. Social History   Social History  . Marital Status: Single    Spouse Name: N/A  . Number of Children: N/A  . Years of Education: N/A   Occupational History  . Not on file.   Social History Main Topics  . Smoking status: Current Every Day Smoker -- 0.50 packs/day    Types: Cigarettes  . Smokeless tobacco: Not on file  . Alcohol Use: No  . Drug Use: No  . Sexual Activity: Not on file   Other Topics Concern  . Not on file   Social History Narrative    Review of Systems: Constitutional: Negative for fever, chills, appetite change, weight loss,  Fatigue. Skin: Negative for rashes or lesions of concern. HENT: Negative for ear pain, ear discharge.nose bleeds Eyes: Negative for pain, discharge, redness, itching and visual disturbance. Neck: Positive for cramp in left neck with yawning. Respiratory: Negative for cough, shortness of breath,   Cardiovascular: Negative for chest pain, palpitations and leg swelling. Gastrointestinal: Negative for abdominal pain, nausea, vomiting, diarrhea, constipation. Positive for heartburn Genitourinary: Negative for dysuria, urgency, frequency, hematuria,  Musculoskeletal: Negative for back pain, joint pain, joint  swelling, and gait problem.Negative for weakness. Neurological: Negative for dizziness, tremors, seizures, syncope,   light-headedness, numbness and headaches.  Hematological: Negative for easy bruising  or bleeding Psychiatric/Behavioral: Negative for depression, anxiety, decreased concentration, confusion   Objective:   Filed Vitals:   02/24/15 1526  BP: 179/83  Pulse: 81  Temp: 97.7 F (36.5 C)    Physical Exam: Constitutional: Patient appears well-developed and well-nourished. No distress. HENT: Normocephalic, atraumatic, External right and left ear normal. Oropharynx is clear and moist.  Eyes: Conjunctivae  and EOM are normal. PERRLA, no scleral icterus. Neck: Normal ROM. Neck supple. No lymphadenopathy, No thyromegaly. CVS: RRR, S1/S2 +, no murmurs, no gallops, no rubs Pulmonary: Effort and breath sounds normal, no stridor, rhonchi, wheezes, rales.  Abdominal: Soft. Normoactive BS,, no distension, tenderness, rebound or guarding.  Musculoskeletal: Normal range of motion. No edema and no tenderness.  Neuro: Alert.Normal muscle tone coordination. Non-focal Skin: Skin is warm and dry. No rash noted. Not diaphoretic. No erythema. No pallor. Psychiatric: Normal mood and affect. Behavior, judgment, thought content normal.  Lab Results  Component Value Date   WBC 8.6 12/25/2014   HGB 14.0 12/25/2014   HCT 43.9 12/25/2014   MCV 95.9 12/25/2014   PLT 325 12/25/2014   Lab Results  Component Value Date   CREATININE 0.96 12/25/2014   BUN 6 12/25/2014   NA 140 12/25/2014   K 3.4* 12/25/2014   CL 103 12/25/2014   CO2 30 12/25/2014    No results found for: HGBA1C Lipid Panel  No results found for: CHOL, TRIG, HDL, CHOLHDL, VLDL, LDLCALC     Assessment and plan:   1. Health care maintenance - Hemoccult - 1 Card (office) - MM DIGITAL SCREENING BILATERAL; Future  2. Essential hypertension -Start taking BP daily rather than every other day.  -Will try to find something less expensive.  3. Financal constraints -Have recommended she contact [facility] about financial assistance.   Return in about 1 month (around 03/24/2015) for HTN.  The patient was given clear instructions to go to ER or return to medical center if symptoms don't improve, worsen or new problems develop. The patient verbalized understanding.    Micheline Chapman FNP  02/24/2015, 3:55 PM

## 2015-02-24 NOTE — Patient Instructions (Signed)
Take BP medication daily. I will check and see if I can prescribe something that will cost less. Call [place] about receiving a [employer].

## 2015-02-26 ENCOUNTER — Other Ambulatory Visit: Payer: Self-pay | Admitting: Family Medicine

## 2015-02-26 DIAGNOSIS — Z1231 Encounter for screening mammogram for malignant neoplasm of breast: Secondary | ICD-10-CM

## 2015-03-11 ENCOUNTER — Ambulatory Visit: Payer: Self-pay

## 2015-03-20 ENCOUNTER — Ambulatory Visit: Payer: Self-pay

## 2015-03-24 ENCOUNTER — Ambulatory Visit: Payer: Self-pay | Admitting: Family Medicine

## 2015-03-27 MED FILL — ?LEVOTHYROXINE 200 MCG TAB: 200 | 30 days supply | Qty: 30 | Fill #2

## 2015-04-11 ENCOUNTER — Emergency Department (HOSPITAL_COMMUNITY)
Admission: EM | Admit: 2015-04-11 | Discharge: 2015-04-11 | Disposition: A | Discharge status: Home / Self Care | Payer: Self-pay | Attending: Emergency Medicine | Admitting: Emergency Medicine

## 2015-04-11 ENCOUNTER — Encounter (HOSPITAL_COMMUNITY): Payer: Self-pay | Admitting: Family Medicine

## 2015-04-11 ENCOUNTER — Ambulatory Visit: Payer: Self-pay | Admitting: Family Medicine

## 2015-04-11 ENCOUNTER — Encounter: Payer: Self-pay | Admitting: *Deleted

## 2015-04-11 DIAGNOSIS — F17210 Nicotine dependence, cigarettes, uncomplicated: Secondary | ICD-10-CM | POA: Insufficient documentation

## 2015-04-11 DIAGNOSIS — Z79899 Other long term (current) drug therapy: Secondary | ICD-10-CM | POA: Insufficient documentation

## 2015-04-11 DIAGNOSIS — E079 Disorder of thyroid, unspecified: Secondary | ICD-10-CM | POA: Insufficient documentation

## 2015-04-11 DIAGNOSIS — I10 Essential (primary) hypertension: Secondary | ICD-10-CM | POA: Insufficient documentation

## 2015-04-11 DIAGNOSIS — J069 Acute upper respiratory infection, unspecified: Secondary | ICD-10-CM | POA: Insufficient documentation

## 2015-04-11 MED ORDER — LORATADINE 10 MG PO TABS: 10.0000 mg | ORAL_TABLET | Freq: Every day | ORAL | Status: DC

## 2015-04-11 MED ORDER — ALBUTEROL SULFATE HFA 108 (90 BASE) MCG/ACT IN AERS
2.0000 | INHALATION_SPRAY | RESPIRATORY_TRACT | Status: DC | PRN
  Administered 2015-04-11: 2 via RESPIRATORY_TRACT
  Filled 2015-04-11: qty 6.7

## 2015-04-11 MED ORDER — AZITHROMYCIN 250 MG PO TABS: 250.0000 mg | ORAL_TABLET | Freq: Every day | ORAL | Status: DC

## 2015-04-11 MED ORDER — GUAIFENESIN-CODEINE 100-10 MG/5ML PO SOLN: 5.0000 mL | Freq: Three times a day (TID) | ORAL | Status: DC | PRN

## 2015-04-11 MED FILL — GUAIFENESIN AC COUGH SYRUP: 100-10 | 8 days supply | Qty: 120 | Fill #0

## 2015-04-11 MED FILL — ?AZITHROMYCIN 250 MG TABLET: 250 MG | 5 days supply | Qty: 6 | Fill #0

## 2015-04-11 NOTE — ED Notes (Signed)
Pt here for cough, congestion. sts hx of bronchitis.

## 2015-04-11 NOTE — ED Provider Notes (Signed)
CSN: [id number]     Arrival date & time 04/11/15  1303 History  By signing my name below, I, Essence Howell, attest that this documentation has been prepared under the direction and in the presence of Montine Circle, PA-C Electronically Signed: Ladene Artist, ED Scribe 04/11/2015 at 2:03 PM.   Chief Complaint  Patient presents with  . Cough   The history is provided by the patient. No language interpreter was used.   HPI Comments: Cynthia Patton is a 55 y.o. female, with a h/o HTN, who presents to the Emergency Department with a chief complaint of dry cough onset 2 days ago. Pt reports associated subjective fever onset last night, postnasal drip, congestion and hoarseness onset today. She has tried OTC cough syrup without significant relief. Pt reports sick contacts at work. She reports h/o bronchitis which was treated with a z-pak and albuterol inhaler.   Past Medical History  Diagnosis Date  . Hypertension   . Thyroid disease    Past Surgical History  Procedure Laterality Date  . Cholecystectomy    . Abdominal hysterectomy     History reviewed. No pertinent family history. Social History  Substance Use Topics  . Smoking status: Current Every Day Smoker -- 0.50 packs/day    Types: Cigarettes  . Smokeless tobacco: None  . Alcohol Use: No   OB History    No data available     Review of Systems  Constitutional: Positive for fever (subjective).  HENT: Positive for congestion, postnasal drip and voice change (hoarse).   Respiratory: Positive for cough.    Allergies  Floxin and Percocet  Home Medications   Prior to Admission medications   Medication Sig Start Date End Date Taking? Authorizing Provider  albuterol (PROVENTIL HFA;VENTOLIN HFA) 108 (90 BASE) MCG/ACT inhaler Inhale 2 puffs into the lungs every 6 (six) hours as needed. For wheezing/shortness of breath Patient not taking: Reported on 01/23/2015 04/20/13   Renaldo Reel, MD  albuterol (PROVENTIL HFA;VENTOLIN HFA) 108  (90 BASE) MCG/ACT inhaler Inhale 2 puffs into the lungs every 6 (six) hours as needed for wheezing or shortness of breath. Patient not taking: Reported on 01/23/2015 01/21/14   Harvie Heck, PA-C  diltiazem (CARDIZEM CD) 120 MG 24 hr capsule Take 1 capsule (120 mg total) by mouth daily. 01/23/15   Micheline Chapman, NP  levothyroxine (SYNTHROID, LEVOTHROID) 200 MCG tablet Take 1 tablet (200 mcg total) by mouth daily before breakfast. 01/23/15   Micheline Chapman, NP   BP 165/88 mmHg  Pulse 98  Temp(Src) 97.9 F (36.6 C) (Oral)  Resp 14  Ht 5\' 3"  (1.6 m)  Wt 224 lb 4 oz (101.719 kg)  BMI 39.73 kg/m2  SpO2 95% Physical Exam Physical Exam  Constitutional: Pt  is oriented to person, place, and time. Appears well-developed and well-nourished. No distress.  HENT:  Head: Normocephalic and atraumatic.  Right Ear: Tympanic membrane, external ear and ear canal normal.  Left Ear: Tympanic membrane, external ear and ear canal normal.  Nose: Mucosal edema and moderate rhinorrhea present. No epistaxis. Right sinus exhibits no maxillary sinus tenderness and no frontal sinus tenderness. Left sinus exhibits no maxillary sinus tenderness and no frontal sinus tenderness.  Mouth/Throat: Uvula is midline and mucous membranes are normal. Mucous membranes are not pale and not cyanotic. No oropharyngeal exudate, posterior oropharyngeal edema, posterior oropharyngeal erythema or tonsillar abscesses.  Eyes: Conjunctivae are normal. Pupils are equal, round, and reactive to light.  Neck: Normal range of motion and full  passive range of motion without pain.  Cardiovascular: Normal rate and intact distal pulses.   Pulmonary/Chest: Effort normal and breath sounds normal. No stridor.  Clear and equal breath sounds without focal wheezes, rhonchi, rales  Abdominal: Soft. Bowel sounds are normal. There is no tenderness.  Musculoskeletal: Normal range of motion.  Lymphadenopathy:    Pthas no cervical adenopathy.   Neurological: Pt is alert and oriented to person, place, and time.  Skin: Skin is warm and dry. No rash noted. Pt is not diaphoretic.  Psychiatric: Normal mood and affect.  Nursing note and vitals reviewed.  ED Course  Procedures (including critical care time) DIAGNOSTIC STUDIES: Oxygen Saturation is 95% on RA, normal by my interpretation.    COORDINATION OF CARE: 1:56 PM-Discussed treatment plan which includes Claritin with pt at bedside and pt agreed to plan.     MDM   Final diagnoses:  URI (upper respiratory infection)    Patient with cough and congestion.  VSS.  Lung sounds are clear. I personally performed the services described in this documentation, which was scribed in my presence. The recorded information has been reviewed and is accurate.     Montine Circle, PA-C 04/11/15 [place], MD 04/11/15 1536

## 2015-04-11 NOTE — Discharge Instructions (Signed)
Upper Respiratory Infection, Adult Most upper respiratory infections (URIs) are a viral infection of the air passages leading to the lungs. A URI affects the nose, throat, and upper air passages. The most common type of URI is nasopharyngitis and is typically referred to as "the common cold." URIs run their course and usually go away on their own. Most of the time, a URI does not require medical attention, but sometimes a bacterial infection in the upper airways can follow a viral infection. This is called a secondary infection. Sinus and middle ear infections are common types of secondary upper respiratory infections. Bacterial pneumonia can also complicate a URI. A URI can worsen asthma and chronic obstructive pulmonary disease (COPD). Sometimes, these complications can require emergency medical care and may be life threatening.  CAUSES Almost all URIs are caused by viruses. A virus is a type of germ and can spread from one person to another.  RISKS FACTORS You may be at risk for a URI if:   You smoke.   You have chronic heart or lung disease.  You have a weakened defense (immune) system.   You are very young or very old.   You have nasal allergies or asthma.  You work in crowded or poorly ventilated areas.  You work in health care facilities or schools. SIGNS AND SYMPTOMS  Symptoms typically develop 2-3 days after you come in contact with a cold virus. Most viral URIs last 7-10 days. However, viral URIs from the influenza virus (flu virus) can last 14-18 days and are typically more severe. Symptoms may include:   Runny or stuffy (congested) nose.   Sneezing.   Cough.   Sore throat.   Headache.   Fatigue.   Fever.   Loss of appetite.   Pain in your forehead, behind your eyes, and over your cheekbones (sinus pain).  Muscle aches.  DIAGNOSIS  Your health care provider may diagnose a URI by:  Physical exam.  Tests to check that your symptoms are not due to  another condition such as:  Strep throat.  Sinusitis.  Pneumonia.  Asthma. TREATMENT  A URI goes away on its own with time. It cannot be cured with medicines, but medicines may be prescribed or recommended to relieve symptoms. Medicines may help:  Reduce your fever.  Reduce your cough.  Relieve nasal congestion. HOME CARE INSTRUCTIONS   Take medicines only as directed by your health care provider.   Gargle warm saltwater or take cough drops to comfort your throat as directed by your health care provider.  Use a warm mist humidifier or inhale steam from a shower to increase air moisture. This may make it easier to breathe.  Drink enough fluid to keep your urine clear or pale yellow.   Eat soups and other clear broths and maintain good nutrition.   Rest as needed.   Return to work when your temperature has returned to normal or as your health care provider advises. You may need to stay home longer to avoid infecting others. You can also use a face mask and careful hand washing to prevent spread of the virus.  Increase the usage of your inhaler if you have asthma.   Do not use any tobacco products, including cigarettes, chewing tobacco, or electronic cigarettes. If you need help quitting, ask your health care provider. PREVENTION  The best way to protect yourself from getting a cold is to practice good hygiene.   Avoid oral or hand contact with people with cold   symptoms.   Wash your hands often if contact occurs.  There is no clear evidence that vitamin C, vitamin E, echinacea, or exercise reduces the chance of developing a cold. However, it is always recommended to get plenty of rest, exercise, and practice good nutrition.  SEEK MEDICAL CARE IF:   You are getting worse rather than better.   Your symptoms are not controlled by medicine.   You have chills.  You have worsening shortness of breath.  You have brown or red mucus.  You have yellow or brown nasal  discharge.  You have pain in your face, especially when you bend forward.  You have a fever.  You have swollen neck glands.  You have pain while swallowing.  You have white areas in the back of your throat. SEEK IMMEDIATE MEDICAL CARE IF:   You have severe or persistent:  Headache.  Ear pain.  Sinus pain.  Chest pain.  You have chronic lung disease and any of the following:  Wheezing.  Prolonged cough.  Coughing up blood.  A change in your usual mucus.  You have a stiff neck.  You have changes in your:  Vision.  Hearing.  Thinking.  Mood. MAKE SURE YOU:   Understand these instructions.  Will watch your condition.  Will get help right away if you are not doing well or get worse.   This information is not intended to replace advice given to you by your health care provider. Make sure you discuss any questions you have with your health care provider.   Document Released: 06/23/2000 Document Revised: 05/14/2014 Document Reviewed: 04/04/2013 Elsevier Interactive Patient Education 2016 [employer] Inc[employer]  

## 2015-04-11 NOTE — ED Notes (Signed)
C/o dry-cough x 3 days. Pt is in no distress.

## 2015-04-30 MED FILL — ?LEVOTHYROXINE 200 MCG TAB: 200 | 30 days supply | Qty: 30 | Fill #3

## 2015-06-02 MED FILL — ?LEVOTHYROXINE 200 MCG TAB: 200 | 30 days supply | Qty: 30 | Fill #4

## 2015-06-30 MED FILL — ?LEVOTHYROXINE 200 MCG TAB: 200 | 30 days supply | Qty: 30 | Fill #5

## 2015-08-05 ENCOUNTER — Other Ambulatory Visit: Payer: Self-pay

## 2015-08-05 DIAGNOSIS — E038 Other specified hypothyroidism: Secondary | ICD-10-CM

## 2015-08-05 MED ORDER — LEVOTHYROXINE SODIUM 200 MCG PO TABS: 200.0000 ug | ORAL_TABLET | Freq: Every day | ORAL | 1 refills | Status: DC

## 2015-08-05 MED FILL — ?LEVOTHYROXINE 200 MCG TAB: 200 | 30 days supply | Qty: 30 | Fill #0

## 2015-08-22 ENCOUNTER — Ambulatory Visit: Payer: Self-pay | Admitting: Family Medicine

## 2015-08-22 ENCOUNTER — Ambulatory Visit: Payer: Self-pay

## 2015-09-05 MED FILL — ?LEVOTHYROXINE 200 MCG TAB: 200 | 30 days supply | Qty: 30 | Fill #1

## 2015-09-22 ENCOUNTER — Ambulatory Visit (INDEPENDENT_AMBULATORY_CARE_PROVIDER_SITE_OTHER): Payer: Self-pay | Admitting: Family Medicine

## 2015-09-22 ENCOUNTER — Encounter: Payer: Self-pay | Admitting: Family Medicine

## 2015-09-22 VITALS — BP 130/115 | HR 89 | Temp 98.5°F | Resp 18 | Ht 63.0 in | Wt 222.0 lb

## 2015-09-22 DIAGNOSIS — J209 Acute bronchitis, unspecified: Secondary | ICD-10-CM

## 2015-09-22 MED ORDER — ALBUTEROL SULFATE HFA 108 (90 BASE) MCG/ACT IN AERS: 2.0000 | INHALATION_SPRAY | Freq: Four times a day (QID) | RESPIRATORY_TRACT | 0 refills | Status: DC | PRN

## 2015-09-22 MED ORDER — GUAIFENESIN-CODEINE 100-10 MG/5ML PO SOLN: 5.0000 mL | Freq: Three times a day (TID) | ORAL | 0 refills | Status: DC | PRN

## 2015-09-22 MED ORDER — AZITHROMYCIN 250 MG PO TABS: 250.0000 mg | ORAL_TABLET | Freq: Every day | ORAL | 0 refills | Status: DC

## 2015-09-22 MED FILL — GUAIFENESIN AC COUGH SYRUP: 100-10 | 8 days supply | Qty: 120 | Fill #0

## 2015-09-22 MED FILL — AZITHROMYCIN 250 MG TABLET: 250 | 5 days supply | Qty: 6 | Fill #0

## 2015-09-22 MED FILL — !VENTOLIN HFA INHALER: 108 (90 BAS | 25 days supply | Qty: 18 | Fill #0

## 2015-09-22 NOTE — Patient Instructions (Signed)
Take BP as prescribed daily and be sure to take one hour before you come in for your next appointment.

## 2015-09-24 NOTE — Progress Notes (Signed)
Cynthia Patton, is a 55 y.o. female  [id number]  [id number]  DOB - December 31, 1960  CC:  Chief Complaint  Patient presents with  . Fever  . Cough    x 5 days   . Chills       HPI: Cynthia Patton is a 55 y.o. female here for a sick visit for nasal congestion, runny nose, coughing. There has been no documented fever but has felt warm and experiencing some sweating.  She has a history of previous acute bronchitis. Symptoms started about 5 days ago. She does continue to smoke.  Allergies  Allergen Reactions  . Floxin [Ocuflox] Other (See Comments)    Disoriented  . Percocet [Oxycodone-Acetaminophen]    Past Medical History:  Diagnosis Date  . Hypertension   . Thyroid disease    Current Outpatient Prescriptions on File Prior to Visit  Medication Sig Dispense Refill  . diltiazem (CARDIZEM CD) 120 MG 24 hr capsule Take 1 capsule (120 mg total) by mouth daily. 90 capsule 1  . levothyroxine (SYNTHROID, LEVOTHROID) 200 MCG tablet Take 1 tablet (200 mcg total) by mouth daily before breakfast. 90 tablet 1  . loratadine (CLARITIN) 10 MG tablet Take 1 tablet (10 mg total) by mouth daily. 30 tablet 0   No current facility-administered medications on file prior to visit.    History reviewed. No pertinent family history. Social History   Social History  . Marital status: Single    Spouse name: N/A  . Number of children: N/A  . Years of education: N/A   Occupational History  . Not on file.   Social History Main Topics  . Smoking status: Current Every Day Smoker    Packs/day: 0.50    Types: Cigarettes  . Smokeless tobacco: Never Used  . Alcohol use No  . Drug use: No  . Sexual activity: Not on file   Other Topics Concern  . Not on file   Social History Narrative  . No narrative on file    Review of Systems: Constitutional: Negative Skin: Negative HENT: Positive for nasal congestion Eyes: Negative  Neck: Negative Respiratory: Positive for cough Cardiovascular:  Negative Gastrointestinal: Negative Genitourinary: Negative  Musculoskeletal: Negative   Neurological: Negative for Hematological: Negative  Psychiatric/Behavioral: Negative    Objective:   Vitals:   09/22/15 1305  BP: (!) 130/115  Pulse: 89  Resp: 18  Temp: 98.5 F (36.9 C)    Physical Exam: Constitutional: Patient appears well-developed and well-nourished. No distress. HENT: Normocephalic, atraumatic, External right and left ear normal. Oropharynx is clear and moist.  Eyes: Conjunctivae and EOM are normal. PERRLA, no scleral icterus. Neck: Normal ROM. Neck supple. No lymphadenopathy, No thyromegaly. CVS: RRR, S1/S2 +, no murmurs, no gallops, no rubs Pulmonary: Effort is normal. There are scattered crackles. Abdominal: Soft. Normoactive BS,, no distension, tenderness, rebound or guarding.  Musculoskeletal: Normal range of motion. No edema and no tenderness.  Neuro: Alert.Normal muscle tone coordination. Non-focal Skin: Skin is warm and dry. No rash noted. Not diaphoretic. No erythema. No pallor. Psychiatric: Normal mood and affect. Behavior, judgment, thought content normal.  Lab Results  Component Value Date   WBC 8.6 12/25/2014   HGB 14.0 12/25/2014   HCT 43.9 12/25/2014   MCV 95.9 12/25/2014   PLT 325 12/25/2014   Lab Results  Component Value Date   CREATININE 0.96 12/25/2014   BUN 6 12/25/2014   NA 140 12/25/2014   K 3.4 (L) 12/25/2014   CL 103 12/25/2014   CO2  30 12/25/2014    No results found for: HGBA1C Lipid Panel  No results found for: CHOL, TRIG, HDL, CHOLHDL, VLDL, LDLCALC     Assessment and plan:    1. Acute bronchitis -Z-pak #1 per directions -refill albuterol inhaler. -Refil guiafenesin with codient.  Follow up prn.  The patient was given clear instructions to go to ER or return to medical center if symptoms don't improve, worsen or new problems develop. The patient verbalized understanding.    Micheline Chapman FNP  09/24/2015,  1:36 PM

## 2015-09-29 ENCOUNTER — Telehealth: Payer: Self-pay

## 2015-09-29 NOTE — Telephone Encounter (Signed)
Called, patient was not home. Patient's mother answered call. I advised her to ask patient to call back at her convince. Thanks!

## 2015-09-29 NOTE — Telephone Encounter (Signed)
Patient states she has finished all medication but is still coughing. Is there anything else you can give her, or give her a refill on cough medication? Please advise. Thanks!

## 2015-09-30 NOTE — Telephone Encounter (Signed)
Cynthia Patton will you please see if we have any appointments for patient and schedule if so if not she can go to urgent care, per provider. Thanks!

## 2015-09-30 NOTE — Telephone Encounter (Signed)
Will a recheck before anything else can be prescribed. Can go to urgent care if cannot get an appointment here.

## 2015-10-03 MED FILL — DILT XR 120 MG CAPSULE: 120 | 30 days supply | Qty: 30 | Fill #1

## 2015-10-07 ENCOUNTER — Ambulatory Visit: Payer: Self-pay | Admitting: Family Medicine

## 2015-10-07 ENCOUNTER — Ambulatory Visit (INDEPENDENT_AMBULATORY_CARE_PROVIDER_SITE_OTHER): Payer: Self-pay | Admitting: Family Medicine

## 2015-10-07 ENCOUNTER — Encounter: Payer: Self-pay | Admitting: Family Medicine

## 2015-10-07 VITALS — BP 151/73 | HR 93 | Temp 98.1°F | Resp 18 | Ht 63.0 in | Wt 222.0 lb

## 2015-10-07 DIAGNOSIS — Z0000 Encounter for general adult medical examination without abnormal findings: Secondary | ICD-10-CM

## 2015-10-07 DIAGNOSIS — I10 Essential (primary) hypertension: Secondary | ICD-10-CM

## 2015-10-07 DIAGNOSIS — E038 Other specified hypothyroidism: Secondary | ICD-10-CM

## 2015-10-07 LAB — CBC WITH DIFFERENTIAL/PLATELET
BASOS ABS: 0 {cells}/uL (ref 0–200)
Basophils Relative: 0 %
EOS PCT: 2 %
Eosinophils Absolute: 196 cells/uL (ref 15–500)
HCT: 42.7 % (ref 35.0–45.0)
HEMOGLOBIN: 13.8 g/dL (ref 11.7–15.5)
LYMPHS ABS: 2940 {cells}/uL (ref 850–3900)
Lymphocytes Relative: 30 %
MCH: 29.8 pg (ref 27.0–33.0)
MCHC: 32.3 g/dL (ref 32.0–36.0)
MCV: 92.2 fL (ref 80.0–100.0)
MONOS PCT: 4 %
MPV: 10.2 fL (ref 7.5–12.5)
Monocytes Absolute: 392 cells/uL (ref 200–950)
NEUTROS ABS: 6272 {cells}/uL (ref 1500–7800)
NEUTROS PCT: 64 %
PLATELETS: 372 10*3/uL (ref 140–400)
RBC: 4.63 MIL/uL (ref 3.80–5.10)
RDW: 13.6 % (ref 11.0–15.0)
WBC: 9.8 10*3/uL (ref 3.8–10.8)

## 2015-10-07 LAB — LIPID PANEL
CHOL/HDL RATIO: 4.5 ratio (ref <=5.0)
Cholesterol: 167 mg/dL (ref 125–200)
HDL: 37 mg/dL — AB (ref >=46)
LDL CALC: 108 mg/dL (ref <130)
Triglycerides: 109 mg/dL (ref <150)
VLDL: 22 mg/dL (ref <30)

## 2015-10-07 LAB — COMPLETE METABOLIC PANEL WITH GFR
ALBUMIN: 4.2 g/dL (ref 3.6–5.1)
ALT: 9 U/L (ref 6–29)
AST: 10 U/L (ref 10–35)
Alkaline Phosphatase: 86 U/L (ref 33–130)
BUN: 8 mg/dL (ref 7–25)
CO2: 26 mmol/L (ref 20–31)
Calcium: 9.4 mg/dL (ref 8.6–10.4)
Chloride: 106 mmol/L (ref 98–110)
Creat: 0.8 mg/dL (ref 0.50–1.05)
GFR, EST NON AFRICAN AMERICAN: 83 mL/min (ref >=60)
GFR, Est African American: >89 mL/min (ref >=60)
GLUCOSE: 163 mg/dL — AB (ref 65–99)
POTASSIUM: 4 mmol/L (ref 3.5–5.3)
SODIUM: 142 mmol/L (ref 135–146)
Total Bilirubin: 0.4 mg/dL (ref 0.2–1.2)
Total Protein: 7.1 g/dL (ref 6.1–8.1)

## 2015-10-07 MED ORDER — GUAIFENESIN-CODEINE 100-10 MG/5ML PO SOLN: 5.0000 mL | Freq: Three times a day (TID) | ORAL | 0 refills | Status: DC | PRN

## 2015-10-07 MED ORDER — DILTIAZEM HCL ER COATED BEADS 120 MG PO CP24: 120.0000 mg | ORAL_CAPSULE | Freq: Every day | ORAL | 1 refills | Status: DC

## 2015-10-07 MED ORDER — LEVOTHYROXINE SODIUM 200 MCG PO TABS: 200.0000 ug | ORAL_TABLET | Freq: Every day | ORAL | 1 refills | Status: DC

## 2015-10-07 MED FILL — ?LEVOTHYROXINE 200 MCG TAB: 200 | 30 days supply | Qty: 30 | Fill #0

## 2015-10-07 NOTE — Progress Notes (Signed)
Cynthia Patton, is a 55 y.o. female  [id number]  [id number]  DOB - 12-17-1960  CC:  Chief Complaint  Patient presents with  . Follow-up       HPI: Cynthia Patton is a 55 y.o. female here for follow-up chronic conditions. She has hypertension and hypothyroidism. She reports doing well except for upper airway congestion and cough which started about 2.5 weeks. She has been treated with Zithromax which she reports did not resolve her symptoms. She reports her cough is better but worsen again last night after being exposed to smoke. She reports she only used her albuterol inhaler 2 times last week. She reports a dry cough, sometimes feels mucus but is unable to get anything up.She reports running out of her BP medication last Thursday. She would like a refill on cough syrup.  Health Maintenance. Declines Tdap and influenza vaccines.  She is in need of mammogram. Has has a hysterectomy, She has stool cards which she has not returned from last visit. She has been screened for HIV but not Hep C. She smokes about 10 cigarettes a day, denies alcohol or drug use.  Allergies  Allergen Reactions  . Floxin [Ocuflox] Other (See Comments)    Disoriented  . Percocet [Oxycodone-Acetaminophen]    Past Medical History:  Diagnosis Date  . Hypertension   . Thyroid disease    Current Outpatient Prescriptions on File Prior to Visit  Medication Sig Dispense Refill  . albuterol (PROVENTIL HFA;VENTOLIN HFA) 108 (90 Base) MCG/ACT inhaler Inhale 2 puffs into the lungs every 6 (six) hours as needed for wheezing or shortness of breath. 1 Inhaler 0  . azithromycin (ZITHROMAX) 250 MG tablet Take 1 tablet (250 mg total) by mouth daily. Take first 2 tablets together, then 1 every day until finished. 6 tablet 0  . loratadine (CLARITIN) 10 MG tablet Take 1 tablet (10 mg total) by mouth daily. 30 tablet 0   No current facility-administered medications on file prior to visit.    No family history on  file. Social History   Social History  . Marital status: Single    Spouse name: N/A  . Number of children: N/A  . Years of education: N/A   Occupational History  . Not on file.   Social History Main Topics  . Smoking status: Current Every Day Smoker    Packs/day: 0.50    Types: Cigarettes  . Smokeless tobacco: Never Used  . Alcohol use No  . Drug use: No  . Sexual activity: Not on file   Other Topics Concern  . Not on file   Social History Narrative  . No narrative on file    Review of Systems: Constitutional: Negative Skin: Negative HENT: Negative  Eyes: Negative  Neck: Negative Respiratory: Positive for coughing, wheezing Cardiovascular: Negative Gastrointestinal: Negative Genitourinary: Negative  Musculoskeletal: Positive for chronic left arm pain Neurological: Negative  Hematological: Negative  Psychiatric/Behavioral: Negative    Objective:   Vitals:   10/07/15 0826 10/07/15 0858  BP: (!) 153/69 (!) 151/73  Pulse: 93   Resp: 18   Temp: 98.1 F (36.7 C)     Physical Exam: Constitutional: Patient appears well-developed and well-nourished. No distress. HENT: Normocephalic, atraumatic, External right and left ear normal. Oropharynx is clear and moist.  Eyes: Conjunctivae and EOM are normal. PERRLA, no scleral icterus. Neck: Normal ROM. Neck supple. No lymphadenopathy, No thyromegaly. CVS: RRR, S1/S2 +, no murmurs, no gallops, no rubs Pulmonary: Effort and breath sounds normal, no stridor, rhonchi,  wheezes, rales.  Abdominal: Soft. Normoactive BS,, no distension, tenderness, rebound or guarding.  Musculoskeletal: Normal range of motion. No edema and no tenderness. Mild decrease in motion of left elbow. Neuro: Alert.Normal muscle tone coordination. Non-focal Skin: Skin is warm and dry. No rash noted. Not diaphoretic. No erythema. No pallor. Psychiatric: Normal mood and affect. Behavior, judgment, thought content normal.  Lab Results  Component Value  Date   WBC 8.6 12/25/2014   HGB 14.0 12/25/2014   HCT 43.9 12/25/2014   MCV 95.9 12/25/2014   PLT 325 12/25/2014   Lab Results  Component Value Date   CREATININE 0.96 12/25/2014   BUN 6 12/25/2014   NA 140 12/25/2014   K 3.4 (L) 12/25/2014   CL 103 12/25/2014   CO2 30 12/25/2014    No results found for: HGBA1C Lipid Panel  No results found for: CHOL, TRIG, HDL, CHOLHDL, VLDL, LDLCALC     Assessment and plan:   1. Healthcare maintenance  - Hemoglobin A1c - CBC with Differential - COMPLETE METABOLIC PANEL WITH GFR - Lipid panel - Hepatitis C Antibody - MM DIGITAL SCREENING BILATERAL; Future  2. Other specified hypothyroidism  - levothyroxine (SYNTHROID, LEVOTHROID) 200 MCG tablet; Take 1 tablet (200 mcg total) by mouth daily before breakfast.  Dispense: 90 tablet; Refill: 1  3. Essential hypertension  - diltiazem (CARDIZEM CD) 120 MG 24 hr capsule; Take 1 capsule (120 mg total) by mouth daily.  Dispense: 90 capsule; Refill: 1   No Follow-up on file.  The patient was given clear instructions to go to ER or return to medical center if symptoms don't improve, worsen or new problems develop. The patient verbalized understanding.    Micheline Chapman FNP  10/07/2015, 9:14 AM

## 2015-10-07 NOTE — Progress Notes (Signed)
Patient is here for FU  Patient denies pain at this time.  Patient request refills on BP medication. Been out since Friday.

## 2015-10-07 NOTE — Patient Instructions (Signed)
Return in 2 weeks for BP check with nurse. Will call pharmacy and ask they fill your prescriptions today Careful of salt.

## 2015-10-08 LAB — HEMOGLOBIN A1C
HEMOGLOBIN A1C: 7 % — AB (ref <5.7)
MEAN PLASMA GLUCOSE: 154 mg/dL

## 2015-10-08 LAB — HEPATITIS C ANTIBODY: HCV AB: NEGATIVE

## 2015-10-21 ENCOUNTER — Ambulatory Visit: Payer: Self-pay

## 2015-10-22 ENCOUNTER — Ambulatory Visit: Payer: Self-pay

## 2015-11-07 MED FILL — ?LEVOTHYROXINE 200 MCG TAB: 200 | 30 days supply | Qty: 30 | Fill #1

## 2015-11-12 ENCOUNTER — Ambulatory Visit: Payer: Self-pay | Admitting: Family Medicine

## 2015-12-15 ENCOUNTER — Telehealth: Payer: Self-pay

## 2015-12-15 MED FILL — LEVOTHYROXINE 200 MCG TAB: 200 | 30 days supply | Qty: 30 | Fill #2

## 2015-12-15 NOTE — Telephone Encounter (Signed)
Called patient regarding refill, she has spoken with pharmacy and all is good.

## 2016-01-14 MED FILL — LEVOTHYROXINE 200 MCG TAB: 200 | 30 days supply | Qty: 30 | Fill #3

## 2016-01-16 ENCOUNTER — Telehealth: Payer: Self-pay

## 2016-01-16 ENCOUNTER — Other Ambulatory Visit: Payer: Self-pay

## 2016-01-16 DIAGNOSIS — E038 Other specified hypothyroidism: Secondary | ICD-10-CM

## 2016-01-16 MED ORDER — LEVOTHYROXINE SODIUM 200 MCG PO TABS: 200.0000 ug | ORAL_TABLET | Freq: Every day | ORAL | 1 refills | Status: DC

## 2016-01-16 MED ORDER — LEVOTHYROXINE SODIUM 200 MCG PO TABS
200.0000 ug | ORAL_TABLET | Freq: Every day | ORAL | 1 refills | Status: DC
Start: 2016-01-16 — End: 2016-01-16

## 2016-01-16 NOTE — Telephone Encounter (Signed)
Refill sent into corrected pharmacy. Thanks!

## 2016-01-16 NOTE — Telephone Encounter (Signed)
1 refill was sent int. Patient needs appointment for labs and visit. Thanks!

## 2016-01-19 ENCOUNTER — Other Ambulatory Visit: Payer: Self-pay

## 2016-01-19 ENCOUNTER — Other Ambulatory Visit: Payer: Self-pay | Admitting: Family Medicine

## 2016-01-19 DIAGNOSIS — E039 Hypothyroidism, unspecified: Secondary | ICD-10-CM

## 2016-01-19 DIAGNOSIS — E119 Type 2 diabetes mellitus without complications: Secondary | ICD-10-CM

## 2016-01-19 LAB — HEMOGLOBIN A1C
Hgb A1c MFr Bld: 6.7 % — ABNORMAL HIGH (ref <5.7)
MEAN PLASMA GLUCOSE: 146 mg/dL

## 2016-01-19 LAB — TSH: TSH: 0.45 m[IU]/L

## 2016-01-19 NOTE — Progress Notes (Unsigned)
Blood drawn without difficulty.

## 2016-01-21 ENCOUNTER — Telehealth: Payer: Self-pay

## 2016-01-21 NOTE — Telephone Encounter (Signed)
Called, no answer and voicemail set up . Will try later.

## 2016-01-21 NOTE — Telephone Encounter (Signed)
-----   Message from Micheline Chapman, NP sent at 01/21/2016 11:17 AM EST ----- A1C 6.7. Was 7 last time. Would recommend going on metformin for early diabetes.  I do not see a history of diabetes in her chart. Would you confirm this. Will need a recheck in 6 months. Give her information on diet and exercise. Thyroid function is within normal limits.

## 2016-01-22 NOTE — Telephone Encounter (Signed)
Called. Patients mother answered the phone, patient was at work. Mother states she will have the patient calls [place] when she get in. Thanks!

## 2016-01-23 NOTE — Telephone Encounter (Signed)
Patient called back. I asked about diabetes, she has never been diagnosed with diabetes but says she does have a family history of diabetes. I advised her to eat low fat/low carb diet, to avoid sweets, and try to exercise as tolerated throughtout week. She is willing to start Metformin.   Please send this in to the [facility] on [address] (it is in chart). Thanks!

## 2016-01-26 ENCOUNTER — Other Ambulatory Visit: Payer: Self-pay | Admitting: Family Medicine

## 2016-01-26 MED ORDER — METFORMIN HCL 500 MG PO TABS: 500.0000 mg | ORAL_TABLET | Freq: Two times a day (BID) | ORAL | 0 refills | Status: DC

## 2016-02-04 ENCOUNTER — Encounter: Payer: Self-pay | Admitting: Family Medicine

## 2016-02-04 ENCOUNTER — Ambulatory Visit (INDEPENDENT_AMBULATORY_CARE_PROVIDER_SITE_OTHER): Payer: Self-pay | Admitting: Family Medicine

## 2016-02-04 VITALS — BP 147/71 | HR 85 | Temp 98.4°F | Resp 16 | Ht 63.0 in | Wt 220.0 lb

## 2016-02-04 DIAGNOSIS — R509 Fever, unspecified: Secondary | ICD-10-CM

## 2016-02-04 DIAGNOSIS — R05 Cough: Secondary | ICD-10-CM

## 2016-02-04 DIAGNOSIS — R6889 Other general symptoms and signs: Secondary | ICD-10-CM

## 2016-02-04 DIAGNOSIS — R059 Cough, unspecified: Secondary | ICD-10-CM

## 2016-02-04 LAB — POCT INFLUENZA A/B
INFLUENZA B, POC: NEGATIVE
Influenza A, POC: NEGATIVE

## 2016-02-04 MED ORDER — CHLORPHEN-PE-ACETAMINOPHEN 4-10-325 MG PO TABS: 1.0000 | ORAL_TABLET | Freq: Four times a day (QID) | ORAL | 0 refills | Status: DC | PRN

## 2016-02-04 MED ORDER — OSELTAMIVIR PHOSPHATE 75 MG PO CAPS
75.0000 mg | ORAL_CAPSULE | Freq: Two times a day (BID) | ORAL | 0 refills | Status: DC
Start: 2016-02-04 — End: 2016-02-04

## 2016-02-04 MED ORDER — OSELTAMIVIR PHOSPHATE 75 MG PO CAPS: 75.0000 mg | ORAL_CAPSULE | Freq: Two times a day (BID) | ORAL | 0 refills | Status: DC

## 2016-02-04 NOTE — Progress Notes (Signed)
Subjective:    Patient ID: Cynthia Patton, female    DOB: [birth year], 56 y.o.   MRN: [id number]  Fever   This is a new problem. The current episode started in the past 7 days. The problem occurs intermittently. The problem has been unchanged. The maximum temperature noted was 100 to 100.9 F. Associated symptoms include coughing, diarrhea, nausea and a sore throat. Pertinent negatives include no abdominal pain, chest pain, ear pain, urinary pain, vomiting or wheezing. The treatment provided mild relief.  Risk factors: sick contacts   Risk factors: no contaminated food, no contaminated water, no hx of cancer, no occupational exposure, no recent sickness and no recent travel    Past Medical History:  Diagnosis Date  . Hypertension   . Thyroid disease    Social History   Social History  . Marital status: Single    Spouse name: N/A  . Number of children: N/A  . Years of education: N/A   Occupational History  . Not on file.   Social History Main Topics  . Smoking status: Current Every Day Smoker    Packs/day: 0.50    Types: Cigarettes  . Smokeless tobacco: Never Used  . Alcohol use No  . Drug use: No  . Sexual activity: Not on file   Other Topics Concern  . Not on file   Social History Narrative  . No narrative on file    There is no immunization history on file for this patient. Review of Systems  Constitutional: Positive for fever.  HENT: Positive for sore throat. Negative for ear pain.   Respiratory: Positive for cough. Negative for wheezing.   Cardiovascular: Negative for chest pain.  Gastrointestinal: Positive for diarrhea and nausea. Negative for abdominal pain and vomiting.  Genitourinary: Negative for dysuria.       Objective:   Physical Exam  Constitutional: She is oriented to person, place, and time. She has a sickly appearance.  HENT:  Nose: Nose normal. Right sinus exhibits no maxillary sinus tenderness and no frontal sinus tenderness. Left sinus  exhibits no maxillary sinus tenderness and no frontal sinus tenderness.  Eyes: Conjunctivae and EOM are normal. Pupils are equal, round, and reactive to light.  Neck: Normal range of motion.  Pulmonary/Chest: Effort normal and breath sounds normal.  Abdominal: Soft.  Lymphadenopathy:       Head (right side): Submandibular adenopathy present.       Head (left side): Submental and submandibular adenopathy present.  Neurological: She is alert and oriented to person, place, and time. She has normal reflexes.  Skin: Skin is warm and dry.  Psychiatric: She has a normal mood and affect. Her behavior is normal. Judgment and thought content normal.     BP (!) 147/71 (BP Location: Left Arm, Patient Position: Sitting, Cuff Size: Large)   Pulse 85   Temp 98.4 F (36.9 C) (Oral)   Resp 16   Ht 5\' 3"  (1.6 m)   Wt 220 lb (99.8 kg)   SpO2 100%   BMI 38.97 kg/m  Assessment & Plan:  1. Fever and chills - Influenza A/B - Chlorphen-PE-Acetaminophen (NOREL AD) 4-10-325 MG TABS; Take 1 tablet by mouth every 6 (six) hours as needed.  Dispense: 20 tablet; Refill: 0  2. Flu-like symptoms - oseltamivir (TAMIFLU) 75 MG capsule; Take 1 capsule (75 mg total) by mouth 2 (two) times daily.  Dispense: 10 capsule; Refill: 0  3. Cough  - Chlorphen-PE-Acetaminophen (NOREL AD) 4-10-325 MG TABS; Take 1 tablet  by mouth every 6 (six) hours as needed.  Dispense: 20 tablet; Refill: 0   RTC: F/U for chronic conditions as previously scheduled  Kaushal Vannice M, FNP   The patient was given clear instructions to go to ER or return to medical center if symptoms do not improve, worsen or new problems develop. The patient verbalized understanding. Will notify patient with laboratory results.

## 2016-02-04 NOTE — Patient Instructions (Addendum)
Fever, Adult A fever is an increase in the body's temperature. It is usually defined as a temperature of 100F (38C) or higher. Brief mild or moderate fevers generally have no long-term effects, and they often do not require treatment. Moderate or high fevers may make you feel uncomfortable and can sometimes be a sign of a serious illness or disease. The sweating that may occur with repeated or prolonged fever may also cause dehydration. Fever is confirmed by taking a temperature with a thermometer. A measured temperature can vary with:  Age.  Time of day.  Location of the thermometer:  Mouth (oral).  Rectum (rectal).  Ear (tympanic).  Underarm (axillary).  Forehead (temporal). Follow these instructions at home: Pay attention to any changes in your symptoms. Take these actions to help with your condition:  Take over-the counter and prescription medicines only as told by your health care provider. Follow the dosing instructions carefully.  If you were prescribed an antibiotic medicine, take it as told by your health care provider. Do not stop taking the antibiotic even if you start to feel better.  Rest as needed.  Drink enough fluid to keep your urine clear or pale yellow. This helps to prevent dehydration.  Sponge yourself or bathe with room-temperature water to help reduce your body temperature as needed. Do not use ice water.  Do not overbundle yourself in blankets or heavy clothes. Contact a health care provider if:  You vomit.  You cannot eat or drink without vomiting.  You have diarrhea.  You have pain when you urinate.  Your symptoms do not improve with treatment.  You develop new symptoms.  You develop excessive weakness. Get help right away if:  You have shortness of breath or have trouble breathing.  You are dizzy or you faint.  You are disoriented or confused.  You develop signs of dehydration, such as a dry mouth, decreased urination, or  paleness.  You develop severe pain in your abdomen.  You have persistent vomiting or diarrhea.  You develop a skin rash.  Your symptoms suddenly get worse. This information is not intended to replace advice given to you by your health care provider. Make sure you discuss any questions you have with your health care provider. Document Released: 06/23/2000 Document Revised: 06/05/2015 Document Reviewed: 02/21/2014 Elsevier Interactive Patient Education  2017 [employer]  Cough, Adult Coughing is a reflex that clears your throat and your airways. Coughing helps to heal and protect your lungs. It is normal to cough occasionally, but a cough that happens with other symptoms or lasts a long time may be a sign of a condition that needs treatment. A cough may last only 2-3 weeks (acute), or it may last longer than 8 weeks (chronic). What are the causes? Coughing is commonly caused by:  Breathing in substances that irritate your lungs.  A viral or bacterial respiratory infection.  Allergies.  Asthma.  Postnasal drip.  Smoking.  Acid backing up from the stomach into the esophagus (gastroesophageal reflux).  Certain medicines.  Chronic lung problems, including COPD (or rarely, lung cancer).  Other medical conditions such as heart failure. Follow these instructions at home: Pay attention to any changes in your symptoms. Take these actions to help with your discomfort:  Take medicines only as told by your health care provider.  If you were prescribed an antibiotic medicine, take it as told by your health care provider. Do not stop taking the antibiotic even if you start to feel better.  Talk with your health care provider before you take a cough suppressant medicine.  Drink enough fluid to keep your urine clear or pale yellow.  If the air is dry, use a cold steam vaporizer or humidifier in your bedroom or your home to help loosen secretions.  Avoid anything that causes you to  cough at work or at home.  If your cough is worse at night, try sleeping in a semi-upright position.  Avoid cigarette smoke. If you smoke, quit smoking. If you need help quitting, ask your health care provider.  Avoid caffeine.  Avoid alcohol.  Rest as needed. Contact a health care provider if:  You have new symptoms.  You cough up pus.  Your cough does not get better after 2-3 weeks, or your cough gets worse.  You cannot control your cough with suppressant medicines and you are losing sleep.  You develop pain that is getting worse or pain that is not controlled with pain medicines.  You have a fever.  You have unexplained weight loss.  You have night sweats. Get help right away if:  You cough up blood.  You have difficulty breathing.  Your heartbeat is very fast. This information is not intended to replace advice given to you by your health care provider. Make sure you discuss any questions you have with your health care provider. Document Released: 06/26/2010 Document Revised: 06/05/2015 Document Reviewed: 03/06/2014 Elsevier Interactive Patient Education  2017 [employer].

## 2016-02-09 ENCOUNTER — Encounter: Payer: Self-pay | Admitting: Family Medicine

## 2016-02-09 ENCOUNTER — Ambulatory Visit (INDEPENDENT_AMBULATORY_CARE_PROVIDER_SITE_OTHER): Payer: Self-pay | Admitting: Family Medicine

## 2016-02-09 VITALS — BP 169/89 | HR 92 | Temp 97.8°F | Resp 14 | Ht 63.0 in | Wt 220.0 lb

## 2016-02-09 DIAGNOSIS — E118 Type 2 diabetes mellitus with unspecified complications: Secondary | ICD-10-CM | POA: Insufficient documentation

## 2016-02-09 DIAGNOSIS — E119 Type 2 diabetes mellitus without complications: Secondary | ICD-10-CM

## 2016-02-09 DIAGNOSIS — Z114 Encounter for screening for human immunodeficiency virus [HIV]: Secondary | ICD-10-CM

## 2016-02-09 DIAGNOSIS — Z23 Encounter for immunization: Secondary | ICD-10-CM | POA: Insufficient documentation

## 2016-02-09 DIAGNOSIS — I10 Essential (primary) hypertension: Secondary | ICD-10-CM | POA: Insufficient documentation

## 2016-02-09 LAB — CBC WITH DIFFERENTIAL/PLATELET
BASOS PCT: 0 %
Basophils Absolute: 0 cells/uL (ref 0–200)
EOS ABS: 144 {cells}/uL (ref 15–500)
Eosinophils Relative: 2 %
HEMATOCRIT: 42.5 % (ref 35.0–45.0)
Hemoglobin: 13.9 g/dL (ref 11.7–15.5)
LYMPHS PCT: 42 %
Lymphs Abs: 3024 cells/uL (ref 850–3900)
MCH: 30 pg (ref 27.0–33.0)
MCHC: 32.7 g/dL (ref 32.0–36.0)
MCV: 91.8 fL (ref 80.0–100.0)
MONO ABS: 504 {cells}/uL (ref 200–950)
MPV: 10.5 fL (ref 7.5–12.5)
Monocytes Relative: 7 %
NEUTROS ABS: 3528 {cells}/uL (ref 1500–7800)
Neutrophils Relative %: 49 %
PLATELETS: 338 10*3/uL (ref 140–400)
RBC: 4.63 MIL/uL (ref 3.80–5.10)
RDW: 13.4 % (ref 11.0–15.0)
WBC: 7.2 10*3/uL (ref 3.8–10.8)

## 2016-02-09 LAB — COMPLETE METABOLIC PANEL WITH GFR
ALT: 26 U/L (ref 6–29)
AST: 21 U/L (ref 10–35)
Albumin: 4.1 g/dL (ref 3.6–5.1)
Alkaline Phosphatase: 72 U/L (ref 33–130)
BUN: 10 mg/dL (ref 7–25)
CHLORIDE: 109 mmol/L (ref 98–110)
CO2: 27 mmol/L (ref 20–31)
Calcium: 9.2 mg/dL (ref 8.6–10.4)
Creat: 0.83 mg/dL (ref 0.50–1.05)
GFR, EST NON AFRICAN AMERICAN: 79 mL/min (ref >=60)
Glucose, Bld: 99 mg/dL (ref 65–99)
POTASSIUM: 3.8 mmol/L (ref 3.5–5.3)
Sodium: 143 mmol/L (ref 135–146)
Total Bilirubin: 0.4 mg/dL (ref 0.2–1.2)
Total Protein: 7.2 g/dL (ref 6.1–8.1)

## 2016-02-09 LAB — LIPID PANEL
CHOL/HDL RATIO: 5.8 ratio — AB (ref <5.0)
Cholesterol: 187 mg/dL (ref <200)
HDL: 32 mg/dL — AB (ref >50)
LDL CALC: 112 mg/dL — AB (ref <100)
TRIGLYCERIDES: 215 mg/dL — AB (ref <150)
VLDL: 43 mg/dL — ABNORMAL HIGH (ref <30)

## 2016-02-09 MED ORDER — METFORMIN HCL 500 MG PO TABS: 500.0000 mg | ORAL_TABLET | Freq: Two times a day (BID) | ORAL | 0 refills | Status: DC

## 2016-02-09 NOTE — Progress Notes (Signed)
Subjective:    Patient ID: Cynthia Patton, female    DOB: [birth year], 56 y.o.   MRN: [id number]  Hypertension  The problem is uncontrolled. Pertinent negatives include no anxiety, blurred vision, chest pain, headaches, malaise/fatigue, neck pain, orthopnea, palpitations, peripheral edema, PND, shortness of breath or sweats. There are no associated agents to hypertension. Risk factors for coronary artery disease include diabetes mellitus, obesity, sedentary lifestyle and smoking/tobacco exposure. Past treatments include beta blockers. Compliance problems include diet.  There is no history of angina, kidney disease, CAD/MI, CVA, heart failure, left ventricular hypertrophy or retinopathy. Identifiable causes of hypertension include a thyroid problem (history of thyroid cancer).  Diabetes  She presents for her follow-up diabetic visit. She has type 2 diabetes mellitus. No MedicAlert identification noted. Pertinent negatives for hypoglycemia include no headaches or sweats. Pertinent negatives for diabetes include no blurred vision, no chest pain, no fatigue, no foot paresthesias, no foot ulcerations, no polydipsia, no polyphagia, no polyuria, no visual change, no weakness and no weight loss. Pertinent negatives for diabetic complications include no CVA or retinopathy. Current diabetic treatment includes oral agent (monotherapy). Compliance with diabetes treatment: Patient did not start Metformin 1 month ago. She is following a high fat/cholesterol diet. When asked about meal planning, she reported none. She has not had a previous visit with a dietitian. An ACE inhibitor/angiotensin II receptor blocker is not being taken. She does not see a podiatrist.Eye exam is not current.   Past Medical History:  Diagnosis Date  . Hypertension   . Thyroid disease    Immunization History  Administered Date(s) Administered  . Tdap 02/09/2016   Social History   Social History  . Marital status: Single    Spouse  name: N/A  . Number of children: N/A  . Years of education: N/A   Occupational History  . Not on file.   Social History Main Topics  . Smoking status: Current Every Day Smoker    Packs/day: 0.50    Types: Cigarettes  . Smokeless tobacco: Never Used  . Alcohol use No  . Drug use: No  . Sexual activity: Not on file   Other Topics Concern  . Not on file   Social History Narrative  . No narrative on file   Review of Systems  Constitutional: Negative.  Negative for fatigue, malaise/fatigue and weight loss.  HENT: Negative.  Negative for dental problem.   Eyes: Negative for blurred vision.  Respiratory: Positive for cough. Negative for shortness of breath.   Cardiovascular: Negative for chest pain, palpitations, orthopnea and PND.  Endocrine: Negative for cold intolerance, heat intolerance, polydipsia, polyphagia and polyuria.  Musculoskeletal: Negative.  Negative for neck pain.  Skin: Negative.   Allergic/Immunologic: Negative.  Negative for immunocompromised state.  Neurological: Negative.  Negative for weakness and headaches.  Hematological: Negative.   Psychiatric/Behavioral: Negative.        Objective:   Physical Exam  Constitutional: She is oriented to person, place, and time.  HENT:  Head: Normocephalic and atraumatic.  Right Ear: External ear normal.  Left Ear: External ear normal.  Nose: Nose normal.  Mouth/Throat: Oropharynx is clear and moist.  Eyes: Conjunctivae and EOM are normal. Pupils are equal, round, and reactive to light.  Neck: Normal range of motion. Neck supple. No thyromegaly present.  Post surgical scar to neck  Cardiovascular: Normal rate, regular rhythm, normal heart sounds and intact distal pulses.   Pulmonary/Chest: Effort normal and breath sounds normal.  Abdominal: Soft. Bowel sounds are  normal.  Musculoskeletal: Normal range of motion.  Neurological: She is alert and oriented to person, place, and time. She has normal reflexes.  Skin:  Skin is warm and dry.  Psychiatric: She has a normal mood and affect. Her behavior is normal. Judgment and thought content normal.      BP (!) 169/89 Comment: manual right arm  Pulse 92   Temp 97.8 F (36.6 C) (Oral)   Resp 14   Ht 5\' 3"  (1.6 Patton)   Wt 220 lb (99.8 kg)   SpO2 95%   BMI 38.97 kg/Patton  Assessment & Plan:  1. Essential hypertension Blood pressure is above goal. Cynthia Patton has not taken medication today. Patient will return on 02/13/2016 for a blood pressure check. The patient is asked to make an attempt to improve diet and exercise patterns to aid in medical management of this problem. - COMPLETE METABOLIC PANEL WITH GFR - CBC with Differential - Lipid panel - Microalbumin/Creatinine Ratio, Urine  2. Controlled type 2 diabetes mellitus without complication, without long-term current use of insulin (HCC) - metFORMIN (GLUCOPHAGE) 500 MG tablet; Take 1 tablet (500 mg total) by mouth 2 (two) times daily with a meal.  Dispense: 180 tablet; Refill: 0 - Microalbumin/Creatinine Ratio, Urine Diabetic Foot Exam - Simple   Simple Foot Form Diabetic Foot exam was performed with the following findings:  Yes 02/09/2016  3:00 PM  Visual Inspection No deformities, no ulcerations, no other skin breakdown bilaterally:  Yes Sensation Testing Intact to touch and monofilament testing bilaterally:  Yes Pulse Check Posterior Tibialis and Dorsalis pulse intact bilaterally:  Yes Comments    3. Need for Tdap vaccination - Tdap vaccine greater than or equal to 7yo IM  4. Screening for HIV (human immunodeficiency virus) - HIV antibody (with reflex)   RTC: 3 months for chronic conditions.  Return on 02/13/2016 for bp check   Cynthia Aday M, FNP   The patient was given clear instructions to go to ER or return to medical center if symptoms do not improve, worsen or new problems develop. The patient verbalized understanding. Will notify patient with laboratory results.

## 2016-02-10 ENCOUNTER — Encounter: Payer: Self-pay | Admitting: Family Medicine

## 2016-02-10 LAB — MICROALBUMIN / CREATININE URINE RATIO
CREATININE, URINE: 234 mg/dL (ref 20–320)
MICROALB/CREAT RATIO: 21 ug/mg{creat} (ref <30)
Microalb, Ur: 5 mg/dL

## 2016-02-10 LAB — HIV ANTIBODY (ROUTINE TESTING W REFLEX): HIV: NONREACTIVE

## 2016-02-13 ENCOUNTER — Telehealth: Payer: Self-pay

## 2016-02-13 ENCOUNTER — Other Ambulatory Visit: Payer: Self-pay | Admitting: Family Medicine

## 2016-02-13 DIAGNOSIS — I10 Essential (primary) hypertension: Secondary | ICD-10-CM

## 2016-02-13 MED ORDER — DILTIAZEM HCL ER COATED BEADS 120 MG PO CP24: 120.0000 mg | ORAL_CAPSULE | Freq: Every day | ORAL | 1 refills | Status: DC

## 2016-02-13 MED FILL — DILTIAZEM 24HR ER 120 MG CA: 120 | 30 days supply | Qty: 30 | Fill #0

## 2016-02-20 ENCOUNTER — Ambulatory Visit: Payer: Self-pay | Admitting: Hematology

## 2016-02-20 VITALS — BP 159/56 | HR 68

## 2016-02-20 DIAGNOSIS — I10 Essential (primary) hypertension: Secondary | ICD-10-CM

## 2016-02-23 ENCOUNTER — Other Ambulatory Visit: Payer: Self-pay | Admitting: Family Medicine

## 2016-02-23 MED ORDER — LISINOPRIL 20 MG PO TABS: 20.0000 mg | ORAL_TABLET | Freq: Every day | ORAL | 3 refills | Status: DC

## 2016-02-23 MED ORDER — SIMVASTATIN 40 MG PO TABS: 40.0000 mg | ORAL_TABLET | Freq: Every day | ORAL | 3 refills | Status: DC

## 2016-02-23 NOTE — Progress Notes (Signed)
Reported to patient. She understands.

## 2016-03-09 ENCOUNTER — Other Ambulatory Visit: Payer: Self-pay

## 2016-03-09 MED ORDER — LISINOPRIL 20 MG PO TABS: 20.0000 mg | ORAL_TABLET | Freq: Every day | ORAL | 3 refills | Status: DC

## 2016-03-19 ENCOUNTER — Encounter: Payer: Self-pay | Admitting: Gastroenterology

## 2016-03-19 ENCOUNTER — Other Ambulatory Visit: Payer: Self-pay | Admitting: Family Medicine

## 2016-03-19 ENCOUNTER — Ambulatory Visit (INDEPENDENT_AMBULATORY_CARE_PROVIDER_SITE_OTHER): Payer: Self-pay | Admitting: Family Medicine

## 2016-03-19 ENCOUNTER — Encounter: Payer: Self-pay | Admitting: Family Medicine

## 2016-03-19 VITALS — BP 142/72 | HR 86 | Temp 98.5°F | Resp 16 | Ht 63.0 in | Wt 220.0 lb

## 2016-03-19 DIAGNOSIS — R319 Hematuria, unspecified: Secondary | ICD-10-CM

## 2016-03-19 DIAGNOSIS — E785 Hyperlipidemia, unspecified: Secondary | ICD-10-CM | POA: Insufficient documentation

## 2016-03-19 DIAGNOSIS — I10 Essential (primary) hypertension: Secondary | ICD-10-CM

## 2016-03-19 DIAGNOSIS — Z1239 Encounter for other screening for malignant neoplasm of breast: Secondary | ICD-10-CM

## 2016-03-19 DIAGNOSIS — E118 Type 2 diabetes mellitus with unspecified complications: Secondary | ICD-10-CM

## 2016-03-19 DIAGNOSIS — Z1231 Encounter for screening mammogram for malignant neoplasm of breast: Secondary | ICD-10-CM

## 2016-03-19 DIAGNOSIS — Z1211 Encounter for screening for malignant neoplasm of colon: Secondary | ICD-10-CM

## 2016-03-19 DIAGNOSIS — Z23 Encounter for immunization: Secondary | ICD-10-CM

## 2016-03-19 LAB — POCT GLYCOSYLATED HEMOGLOBIN (HGB A1C): HEMOGLOBIN A1C: 6.5

## 2016-03-19 LAB — POCT URINALYSIS DIP (DEVICE)
Bilirubin Urine: NEGATIVE
GLUCOSE, UA: NEGATIVE mg/dL
Ketones, ur: NEGATIVE mg/dL
Leukocytes, UA: NEGATIVE
NITRITE: NEGATIVE
PH: 5.5 (ref 5.0–8.0)
PROTEIN: NEGATIVE mg/dL
Specific Gravity, Urine: 1.015 (ref 1.005–1.030)
UROBILINOGEN UA: 0.2 mg/dL (ref 0.0–1.0)

## 2016-03-19 MED ORDER — ASPIRIN EC 81 MG PO TBEC: 81.0000 mg | DELAYED_RELEASE_TABLET | Freq: Every day | ORAL | 11 refills | Status: DC

## 2016-03-19 NOTE — Progress Notes (Signed)
Subjective:    Patient ID: Cynthia Patton, female    DOB: 04-10-1960, 56 y.o.   MRN: [id number]  Hypertension  Chronicity: Patient presents for a 1 month follow up of chronic conditions. The problem is uncontrolled. Pertinent negatives include no anxiety, blurred vision, chest pain, headaches, malaise/fatigue, neck pain, orthopnea, palpitations, peripheral edema, PND, shortness of breath or sweats. There are no associated agents to hypertension. Risk factors for coronary artery disease include diabetes mellitus, obesity, sedentary lifestyle and smoking/tobacco exposure. Past treatments include beta blockers. Compliance problems include diet.  There is no history of angina, kidney disease, CAD/MI, CVA, heart failure, left ventricular hypertrophy or retinopathy. Identifiable causes of hypertension include a thyroid problem (history of thyroid cancer).  Diabetes  She presents for her follow-up (She was started on Metformin 1 month ago. She says that she has not been taking medication consistently. She states that it gives her urine an odor and makes it change colors. ) diabetic visit. She has type 2 diabetes mellitus. No MedicAlert identification noted. Pertinent negatives for hypoglycemia include no headaches or sweats. Pertinent negatives for diabetes include no blurred vision, no chest pain, no fatigue, no foot paresthesias, no foot ulcerations, no polydipsia, no polyphagia, no polyuria, no visual change, no weakness and no weight loss. Pertinent negatives for diabetic complications include no CVA or retinopathy. Current diabetic treatment includes oral agent (monotherapy). Compliance with diabetes treatment: Patient did not start Metformin 1 month ago. She is following a high fat/cholesterol diet. When asked about meal planning, she reported none. She has not had a previous visit with a dietitian. An ACE inhibitor/angiotensin II receptor blocker is not being taken. She does not see a podiatrist.Eye exam is  not current.   Past Medical History:  Diagnosis Date  . Hypertension   . Thyroid disease    Immunization History  Administered Date(s) Administered  . Tdap 02/09/2016   Social History   Social History  . Marital status: Single    Spouse name: N/A  . Number of children: N/A  . Years of education: N/A   Occupational History  . Not on file.   Social History Main Topics  . Smoking status: Current Every Day Smoker    Packs/day: 0.50    Types: Cigarettes  . Smokeless tobacco: Never Used  . Alcohol use No  . Drug use: No  . Sexual activity: Not on file   Other Topics Concern  . Not on file   Social History Narrative  . No narrative on file   Review of Systems  Constitutional: Negative.  Negative for fatigue, malaise/fatigue and weight loss.  HENT: Negative.  Negative for dental problem.   Eyes: Negative for blurred vision.  Respiratory: Positive for cough. Negative for shortness of breath.   Cardiovascular: Negative for chest pain, palpitations, orthopnea and PND.  Endocrine: Negative for cold intolerance, heat intolerance, polydipsia, polyphagia and polyuria.  Musculoskeletal: Negative.  Negative for neck pain.  Skin: Negative.   Allergic/Immunologic: Negative.  Negative for immunocompromised state.  Neurological: Negative.  Negative for weakness and headaches.  Hematological: Negative.   Psychiatric/Behavioral: Negative.        Objective:   Physical Exam  Constitutional: She is oriented to person, place, and time.  HENT:  Head: Normocephalic and atraumatic.  Right Ear: External ear normal.  Left Ear: External ear normal.  Nose: Nose normal.  Mouth/Throat: Oropharynx is clear and moist.  Eyes: Conjunctivae and EOM are normal. Pupils are equal, round, and reactive to light.  Neck: Normal range of motion. Neck supple. No thyromegaly present.  Post surgical scar to neck  Cardiovascular: Normal rate, regular rhythm, normal heart sounds and intact distal pulses.    Pulmonary/Chest: Effort normal and breath sounds normal.  Abdominal: Soft. Bowel sounds are normal.  Musculoskeletal: Normal range of motion.  Neurological: She is alert and oriented to person, place, and time. She has normal reflexes.  Skin: Skin is warm and dry.  Psychiatric: She has a normal mood and affect. Her behavior is normal. Judgment and thought content normal.      BP (!) 142/72 (BP Location: Right Arm, Patient Position: Sitting, Cuff Size: Large)   Pulse 86   Temp 98.5 F (36.9 C) (Oral)   Resp 16   Ht 5\' 3"  (1.6 m)   Wt 220 lb (99.8 kg)   SpO2 100%   BMI 38.97 kg/m  Assessment & Plan:  1. Essential hypertension Blood pressure is at goal on current medication regimen. Will continue Lisinopril 20 mg. The patient is asked to make an attempt to improve diet and exercise patterns to aid in medical management of this problem.  2. Controlled type 2 diabetes mellitus with complication, without long-term current use of insulin (HCC) Hemoglobin a1C is 6.5, will hold metformin. Discussed starting a carbohydrate diet at length. Recommend a lowfat, low carbohydrate diet divided over 5-6 small meals, increase water intake to 6-8 glasses, and 150 minutes per week of cardiovascular exercise.   - HgB A1c - Ambulatory referral to Ophthalmology  3. Hyperlipidemia, unspecified hyperlipidemia type  - aspirin EC 81 MG tablet; Take 1 tablet (81 mg total) by mouth daily.  Dispense: 30 tablet; Refill: 11  4. Colon cancer screening  - Ambulatory referral to Gastroenterology  5. Breast cancer screening  - MM DIGITAL SCREENING BILATERAL; Future  6. Immunization due  - Pneumococcal polysaccharide vaccine 23-valent greater than or equal to 2yo subcutaneous/IM  7. Hematuria, unspecified type  - Urine culture - ANA    RTC: 3 months for hypertension and diabetes mellitus   Brent Noto M, FNP

## 2016-03-22 LAB — ANA: Anti Nuclear Antibody(ANA): POSITIVE — AB

## 2016-03-22 LAB — ANTI-NUCLEAR AB-TITER (ANA TITER): ANA Titer 1: 1:80 {titer} — ABNORMAL HIGH

## 2016-03-23 ENCOUNTER — Telehealth: Payer: Self-pay

## 2016-03-23 NOTE — Telephone Encounter (Signed)
This has been added to the labs. Thanks!

## 2016-03-23 NOTE — Telephone Encounter (Signed)
-----   Message from Dorena Dew, [place] sent at 03/23/2016 11:18 AM EDT ----- Regarding: lab results Please call patient to add on an Anti-smith and anti DNA.   Thanks ----- Message ----- From: Adelina Mings, LPN Sent: 07/14/1636   8:49 AM To: Dorena Dew, FNP

## 2016-03-24 LAB — ANTI-SMITH ANTIBODY: ENA SM AB SER-ACNC: NEGATIVE

## 2016-03-24 LAB — ANTI-DNA ANTIBODY, DOUBLE-STRANDED: ds DNA Ab: <1 IU/mL

## 2016-03-25 LAB — URINE CULTURE: Organism ID, Bacteria: NO GROWTH

## 2016-03-29 ENCOUNTER — Telehealth: Payer: Self-pay

## 2016-03-29 ENCOUNTER — Other Ambulatory Visit: Payer: Self-pay | Admitting: Family Medicine

## 2016-03-29 DIAGNOSIS — R768 Other specified abnormal immunological findings in serum: Secondary | ICD-10-CM

## 2016-03-29 NOTE — Telephone Encounter (Signed)
[place], Please advise. Thanks!  

## 2016-03-30 ENCOUNTER — Other Ambulatory Visit: Payer: Self-pay

## 2016-03-31 ENCOUNTER — Other Ambulatory Visit: Payer: Self-pay

## 2016-04-02 ENCOUNTER — Other Ambulatory Visit (INDEPENDENT_AMBULATORY_CARE_PROVIDER_SITE_OTHER): Payer: Self-pay

## 2016-04-02 DIAGNOSIS — R768 Other specified abnormal immunological findings in serum: Secondary | ICD-10-CM

## 2016-04-05 LAB — CYCLIC CITRUL PEPTIDE ANTIBODY, IGG

## 2016-04-05 LAB — RHEUMATOID FACTOR

## 2016-04-05 LAB — RNP ANTIBODY: Ribonucleic Protein(ENA) Antibody, IgG: <1

## 2016-04-06 ENCOUNTER — Other Ambulatory Visit: Payer: Self-pay | Admitting: Family Medicine

## 2016-04-06 DIAGNOSIS — R768 Other specified abnormal immunological findings in serum: Secondary | ICD-10-CM | POA: Insufficient documentation

## 2016-04-06 DIAGNOSIS — M2550 Pain in unspecified joint: Secondary | ICD-10-CM

## 2016-04-13 ENCOUNTER — Ambulatory Visit (INDEPENDENT_AMBULATORY_CARE_PROVIDER_SITE_OTHER): Payer: Self-pay | Admitting: Family Medicine

## 2016-04-13 ENCOUNTER — Encounter: Payer: Self-pay | Admitting: Family Medicine

## 2016-04-13 VITALS — BP 152/78 | HR 81 | Temp 98.3°F | Resp 16 | Ht 63.0 in | Wt 216.0 lb

## 2016-04-13 DIAGNOSIS — E118 Type 2 diabetes mellitus with unspecified complications: Secondary | ICD-10-CM

## 2016-04-13 DIAGNOSIS — I10 Essential (primary) hypertension: Secondary | ICD-10-CM

## 2016-04-13 DIAGNOSIS — M25531 Pain in right wrist: Secondary | ICD-10-CM

## 2016-04-13 DIAGNOSIS — R768 Other specified abnormal immunological findings in serum: Secondary | ICD-10-CM

## 2016-04-13 DIAGNOSIS — M19039 Primary osteoarthritis, unspecified wrist: Secondary | ICD-10-CM

## 2016-04-13 LAB — POCT URINALYSIS DIP (DEVICE)
Bilirubin Urine: NEGATIVE
Glucose, UA: NEGATIVE mg/dL
KETONES UR: NEGATIVE mg/dL
Leukocytes, UA: NEGATIVE
Nitrite: NEGATIVE
PH: 6.5 (ref 5.0–8.0)
PROTEIN: 30 mg/dL — AB
SPECIFIC GRAVITY, URINE: 1.025 (ref 1.005–1.030)
UROBILINOGEN UA: 2 mg/dL — AB (ref 0.0–1.0)

## 2016-04-13 LAB — BASIC METABOLIC PANEL WITH GFR
BUN: 9 mg/dL (ref 7–25)
CHLORIDE: 108 mmol/L (ref 98–110)
CO2: 26 mmol/L (ref 20–31)
Calcium: 8.8 mg/dL (ref 8.6–10.4)
Creat: 0.85 mg/dL (ref 0.50–1.05)
GFR, EST NON AFRICAN AMERICAN: 76 mL/min (ref >=60)
GFR, Est African American: 88 mL/min (ref >=60)
GLUCOSE: 124 mg/dL — AB (ref 65–99)
POTASSIUM: 4 mmol/L (ref 3.5–5.3)
Sodium: 143 mmol/L (ref 135–146)

## 2016-04-13 LAB — POCT GLYCOSYLATED HEMOGLOBIN (HGB A1C): Hemoglobin A1C: 6.8

## 2016-04-13 MED ORDER — MELOXICAM 7.5 MG PO TABS: 7.5000 mg | ORAL_TABLET | Freq: Every day | ORAL | 0 refills | Status: DC

## 2016-04-13 MED ORDER — SAXAGLIPTIN HCL 2.5 MG PO TABS: 2.5000 mg | ORAL_TABLET | Freq: Every day | ORAL | 2 refills | Status: DC

## 2016-04-13 MED FILL — MELOXICAM 7.5 MG TABLET: 7.5 | 30 days supply | Qty: 30 | Fill #0

## 2016-04-13 NOTE — Progress Notes (Signed)
Subjective:    Patient ID: Cynthia Patton, female    DOB: 02/22/1960, 56 y.o.   MRN: [id number] Ms. Cynthia Patton, a 56 year old female with a history of hypertension and diabetes mellitus type 2 presents complaining of joint pain primarily to wrists bilaterally. Patient has had wrist pain for greater than 1 year that is worse in am. Pain is described as stiffness, constant, and aching. She also states that fingers are numb and cold periodically.  She has taken OTC Ibuprofen and Naproxen without sustained relief. Patient was found to have a positive ANA with a homogenous pattern with an elevated antibody level. Patient has a negative RF, CCP, Anti-Smith, Anti-DNA, Anti-nuclear ab titer, and RNP titer.  Hypertension  Chronicity: Patient presents for a 1 month follow up of chronic conditions. The problem is uncontrolled. Pertinent negatives include no anxiety, blurred vision, chest pain, headaches, malaise/fatigue, neck pain, orthopnea, palpitations, peripheral edema, PND, shortness of breath or sweats. There are no associated agents to hypertension. Risk factors for coronary artery disease include diabetes mellitus, obesity, sedentary lifestyle and smoking/tobacco exposure. Past treatments include beta blockers. Compliance problems include diet.  There is no history of angina, kidney disease, CAD/MI, CVA, heart failure, left ventricular hypertrophy or retinopathy. Identifiable causes of hypertension include a thyroid problem (history of thyroid cancer).  Diabetes  She presents for her follow-up (She was started on Metformin 1 month ago. She says that she has not been taking medication consistently. She states that it gives her urine an odor and makes it change colors. ) diabetic visit. She has type 2 diabetes mellitus. No MedicAlert identification noted. Pertinent negatives for hypoglycemia include no headaches or sweats. Pertinent negatives for diabetes include no blurred vision, no chest pain, no fatigue, no  foot paresthesias, no foot ulcerations, no polydipsia, no polyphagia, no polyuria, no visual change, no weakness and no weight loss. Pertinent negatives for diabetic complications include no CVA or retinopathy. Current diabetic treatment includes oral agent (monotherapy). Compliance with diabetes treatment: Patient did not start Metformin 1 month ago. She is following a high fat/cholesterol diet. When asked about meal planning, she reported none. She has not had a previous visit with a dietitian. An ACE inhibitor/angiotensin II receptor blocker is not being taken. She does not see a podiatrist.Eye exam is not current.  Wrist Pain   The pain is present in the right wrist and left wrist. This is a recurrent problem. The current episode started more than 1 year ago. There has been no history of extremity trauma. The problem occurs constantly. The quality of the pain is described as aching. The pain is at a severity of 6/10. The pain is moderate. Associated symptoms include joint locking, joint swelling, a limited range of motion, numbness and stiffness.   Past Medical History:  Diagnosis Date  . Hypertension   . Thyroid disease    Immunization History  Administered Date(s) Administered  . Pneumococcal Polysaccharide-23 03/19/2016  . Tdap 02/09/2016   Social History   Social History  . Marital status: Single    Spouse name: N/A  . Number of children: N/A  . Years of education: N/A   Occupational History  . Not on file.   Social History Main Topics  . Smoking status: Current Every Day Smoker    Packs/day: 0.50    Types: Cigarettes  . Smokeless tobacco: Never Used  . Alcohol use No  . Drug use: No  . Sexual activity: Not on file   Other  Topics Concern  . Not on file   Social History Narrative  . No narrative on file   Review of Systems  Constitutional: Negative.  Negative for fatigue, malaise/fatigue and weight loss.  HENT: Negative.  Negative for dental problem.   Eyes: Negative  for blurred vision.  Respiratory: Positive for cough. Negative for shortness of breath.   Cardiovascular: Negative for chest pain, palpitations, orthopnea and PND.  Endocrine: Negative for cold intolerance, heat intolerance, polydipsia, polyphagia and polyuria.  Musculoskeletal: Positive for arthralgias and stiffness. Negative for neck pain.  Skin: Negative.   Allergic/Immunologic: Negative.  Negative for immunocompromised state.  Neurological: Positive for numbness. Negative for weakness and headaches.  Hematological: Negative.   Psychiatric/Behavioral: Negative.        Objective:   Physical Exam  Constitutional: She is oriented to person, place, and time.  HENT:  Head: Normocephalic and atraumatic.  Right Ear: External ear normal.  Left Ear: External ear normal.  Nose: Nose normal.  Mouth/Throat: Oropharynx is clear and moist.  Eyes: Conjunctivae and EOM are normal. Pupils are equal, round, and reactive to light.  Neck: Normal range of motion. Neck supple. No thyromegaly present.  Post surgical scar to neck  Cardiovascular: Normal rate, regular rhythm, normal heart sounds and intact distal pulses.   Pulmonary/Chest: Effort normal and breath sounds normal.  Abdominal: Soft. Bowel sounds are normal.  Musculoskeletal:       Right wrist: She exhibits decreased range of motion and swelling.       Left wrist: She exhibits decreased range of motion and swelling.  Neurological: She is alert and oriented to person, place, and time. She has normal reflexes.  Skin: Skin is warm and dry.  Psychiatric: She has a normal mood and affect. Her behavior is normal. Judgment and thought content normal.      BP (!) 152/78   Pulse 81   Temp 98.3 F (36.8 C) (Oral)   Resp 16   Ht 5\' 3"  (1.6 m)   Wt 216 lb (98 kg)   SpO2 99%   BMI 38.26 kg/m  Assessment & Plan:  1. Controlled type 2 diabetes mellitus with complication, without long-term current use of insulin ([place]) Patient says that she  stopped Metformin due diarrhea and her urine changed colors.  Will start a trial of Onglyza 2.5 mg and will check A1C in  3 months.  - BASIC METABOLIC PANEL WITH GFR - HgB A1c - saxagliptin HCl (ONGLYZA) 2.5 MG TABS tablet; Take 1 tablet (2.5 mg total) by mouth daily.  Dispense: 30 tablet; Refill: 2  2. Essential hypertension Blood pressure is above goal. She has not been taking anti-hypertensive medication consistently.  - BASIC METABOLIC PANEL WITH GFR  3. Right wrist pain - meloxicam (MOBIC) 7.5 MG tablet; Take 1 tablet (7.5 mg total) by mouth daily.  Dispense: 30 tablet; Refill: 0  4. Positive ANA (antinuclear antibody) Patient had a positive ANA with a homogenous pattern and high ANA titers. Patient has a negative RF, CCP, Anti-Smith, Anti-DNA, Anti-nuclear ab titer, and RNP titer. 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 She needs further workup and evaluation. She recently lost her health insurance. Patient was given information financial assistance.  Symptoms are consistent with arthritis. She  can benefit from self management. This includes healthy lifestyle changes such as stopping smoking, reducing stress and protecting the hands from the cold.  5. Arthritis of wrist - meloxicam (MOBIC) 7.5 MG tablet; Take  1 tablet (7.5 mg total) by mouth daily.  Dispense: 30 tablet; Refill: 0    RTC: 1 month for a f/u of DMII, HTN, and arthritis   Donia Pounds  MSN, FNP-C [facility] [place],  [phone]    The patient was given clear instructions to go to ER or return to medical center if symptoms do not improve, worsen or new problems develop. The patient verbalized understanding. Will notify patient with laboratory results.

## 2016-04-13 NOTE — Patient Instructions (Addendum)
Hypertension: Re-start Lisinopril 20 mg daily  Diabetes:  Onglyza 2.5 mg daily with breakfast. Recommend a lowfat, low carbohydrate diet divided over 5-6 small meals, increase water intake to 6-8 glasses, and 150 minutes per week of cardiovascular exercise.   Arthritis:  Will start a trial of Meloxicam 7.5 mg daily with food.    Joint Pain Joint pain, which is also called arthralgia, can be caused by many things. Joint pain often goes away when you follow your health care provider's instructions for relieving pain at home. However, joint pain can also be caused by conditions that require further treatment. Common causes of joint pain include:  Bruising in the area of the joint.  Overuse of the joint.  Wear and tear on the joints that occur with aging (osteoarthritis).  Various other forms of arthritis.  A buildup of a crystal form of uric acid in the joint (gout).  Infections of the joint (septic arthritis) or of the bone (osteomyelitis). Your health care provider may recommend medicine to help with the pain. If your joint pain continues, additional tests may be needed to diagnose your condition. Follow these instructions at home: Watch your condition for any changes. Follow these instructions as directed to lessen the pain that you are feeling.  Take medicines only as directed by your health care provider.  Rest the affected area for as long as your health care provider says that you should. If directed to do so, raise the painful joint above the level of your heart while you are sitting or lying down.  Do not do things that cause or worsen pain.  If directed, apply ice to the painful area:  Put ice in a plastic bag.  Place a towel between your skin and the bag.  Leave the ice on for 20 minutes, 2-3 times per day.  Wear an elastic bandage, splint, or sling as directed by your health care provider. Loosen the elastic bandage or splint if your fingers or toes become numb and  tingle, or if they turn cold and blue.  Begin exercising or stretching the affected area as directed by your health care provider. Ask your health care provider what types of exercise are safe for you.  Keep all follow-up visits as directed by your health care provider. This is important. Contact a health care provider if:  Your pain increases, and medicine does not help.  Your joint pain does not improve within 3 days.  You have increased bruising or swelling.  You have a fever.  You lose 10 lb (4.5 kg) or more without trying. Get help right away if:  You are not able to move the joint.  Your fingers or toes become numb or they turn cold and blue. This information is not intended to replace advice given to you by your health care provider. Make sure you discuss any questions you have with your health care provider. Document Released: 12/28/2004 Document Revised: 05/30/2015 Document Reviewed: 10/09/2013 Elsevier Interactive Patient Education  2017 [employer].

## 2016-04-14 MED FILL — ONGLYZA 2.5 MG TABLET: 2.5 | 30 days supply | Qty: 30 | Fill #0

## 2016-05-17 ENCOUNTER — Ambulatory Visit: Payer: Self-pay | Admitting: Family Medicine

## 2016-05-18 MED FILL — DILTIAZEM 24HR ER 120 MG CA: 120 | 30 days supply | Qty: 30 | Fill #1

## 2016-05-20 ENCOUNTER — Ambulatory Visit (INDEPENDENT_AMBULATORY_CARE_PROVIDER_SITE_OTHER): Payer: Self-pay | Admitting: Family Medicine

## 2016-05-20 ENCOUNTER — Encounter: Payer: Self-pay | Admitting: Family Medicine

## 2016-05-20 VITALS — BP 178/74 | HR 74 | Temp 98.0°F | Resp 16 | Ht 63.0 in | Wt 214.0 lb

## 2016-05-20 DIAGNOSIS — I10 Essential (primary) hypertension: Secondary | ICD-10-CM

## 2016-05-20 DIAGNOSIS — M25531 Pain in right wrist: Secondary | ICD-10-CM

## 2016-05-20 DIAGNOSIS — E785 Hyperlipidemia, unspecified: Secondary | ICD-10-CM

## 2016-05-20 MED ORDER — MELOXICAM 15 MG PO TABS: 15.0000 mg | ORAL_TABLET | Freq: Every day | ORAL | 1 refills | Status: DC

## 2016-05-20 MED ORDER — DILTIAZEM HCL ER 180 MG PO CP24: 180.0000 mg | ORAL_CAPSULE | Freq: Every day | ORAL | 0 refills | Status: DC

## 2016-05-20 MED ORDER — OMEGA-3-ACID ETHYL ESTERS 1 G PO CAPS: 2.0000 g | ORAL_CAPSULE | Freq: Two times a day (BID) | ORAL | 5 refills | Status: DC

## 2016-05-20 MED FILL — OMEGA-3 ETHYL ESTERS 1 GM C: 1 | 15 days supply | Qty: 60 | Fill #0

## 2016-05-20 MED FILL — MELOXICAM 15 MG TABLET: 15 | 30 days supply | Qty: 30 | Fill #0

## 2016-05-20 NOTE — Patient Instructions (Addendum)
1. Hypertension: Will increase Diltiazem 180 mg daily.  Follow a lowfat, low carbohydrate diet and daily walking regimen.  2. Elevated cholesterol: The patient is asked to make an attempt to improve diet and exercise patterns to aid in medical management of this problem. Will start Lovaza 2 grams twice daily.  3. Chronic right wrist pain:  Will continue Meloxicam 15 mg   4. Diabetes mellitus:  Onglyza 2.5 mg daily      Fat and Cholesterol Restricted Diet Getting too much fat and cholesterol in your diet may cause health problems. Following this diet helps keep your fat and cholesterol at normal levels. This can keep you from getting sick. What types of fat should I choose?  Choose monosaturated and polyunsaturated fats. These are found in foods such as olive oil, canola oil, flaxseeds, walnuts, almonds, and seeds.  Eat more omega-3 fats. Good choices include salmon, mackerel, sardines, tuna, flaxseed oil, and ground flaxseeds.  Limit saturated fats. These are in animal products such as meats, butter, and cream. They can also be in plant products such as palm oil, palm kernel oil, and coconut oil.  Avoid foods with partially hydrogenated oils in them. These contain trans fats. Examples of foods that have trans fats are stick margarine, some tub margarines, cookies, crackers, and other baked goods. What general guidelines do I need to follow?  Check food labels. Look for the words "trans fat" and "saturated fat."  When preparing a meal:  Fill half of your plate with vegetables and green salads.  Fill one fourth of your plate with whole grains. Look for the word "whole" as the first word in the ingredient list.  Fill one fourth of your plate with lean protein foods.  Eat more foods that have fiber, like apples, carrots, beans, peas, and barley.  Eat more home-cooked foods. Eat less at restaurants and buffets.  Limit or avoid alcohol.  Limit foods high in starch and  sugar.  Limit fried foods.  Cook foods without frying them. Baking, boiling, grilling, and broiling are all great options.  Lose weight if you are overweight. Losing even a small amount of weight can help your overall health. It can also help prevent diseases such as diabetes and heart disease. What foods can I eat? Grains  Whole grains, such as whole wheat or whole grain breads, crackers, cereals, and pasta. Unsweetened oatmeal, bulgur, barley, quinoa, or brown rice. Corn or whole wheat flour tortillas. Vegetables  Fresh or frozen vegetables (raw, steamed, roasted, or grilled). Green salads. Fruits  All fresh, canned (in natural juice), or frozen fruits. Meat and Other Protein Products  Ground beef (85% or leaner), grass-fed beef, or beef trimmed of fat. Skinless chicken or [place]. Ground chicken or [place]. Pork trimmed of fat. All fish and seafood. Eggs. Dried beans, peas, or lentils. Unsalted nuts or seeds. Unsalted canned or dry beans. Dairy  Low-fat dairy products, such as skim or 1% milk, 2% or reduced-fat cheeses, low-fat ricotta or cottage cheese, or plain low-fat yogurt. Fats and Oils  Tub margarines without trans fats. Light or reduced-fat mayonnaise and salad dressings. Avocado. Olive, canola, sesame, or safflower oils. Natural peanut or almond butter (choose ones without added sugar and oil). The items listed above may not be a complete list of recommended foods or beverages. Contact your dietitian for more options.  What foods are not recommended? Grains  White bread. White pasta. White rice. Cornbread. Bagels, pastries, and croissants. Crackers that contain trans fat. Vegetables  White potatoes. Corn. Creamed or fried vegetables. Vegetables in a cheese sauce. Fruits  Dried fruits. Canned fruit in light or heavy syrup. Fruit juice. Meat and Other Protein Products  Fatty cuts of meat. Ribs, chicken wings, bacon, sausage, bologna, salami, chitterlings, fatback, hot dogs,  bratwurst, and packaged luncheon meats. Liver and organ meats. Dairy  Whole or 2% milk, cream, half-and-half, and cream cheese. Whole milk cheeses. Whole-fat or sweetened yogurt. Full-fat cheeses. Nondairy creamers and whipped toppings. Processed cheese, cheese spreads, or cheese curds. Sweets and Desserts  Corn syrup, sugars, honey, and molasses. Candy. Jam and jelly. Syrup. Sweetened cereals. Cookies, pies, cakes, donuts, muffins, and ice cream. Fats and Oils  Butter, stick margarine, lard, shortening, ghee, or bacon fat. Coconut, palm kernel, or palm oils. Beverages  Alcohol. Sweetened drinks (such as sodas, lemonade, and fruit drinks or punches). The items listed above may not be a complete list of foods and beverages to avoid. Contact your dietitian for more information.  This information is not intended to replace advice given to you by your health care provider. Make sure you discuss any questions you have with your health care provider. Document Released: 06/29/2011 Document Revised: 09/04/2015 Document Reviewed: 03/29/2013 Elsevier Interactive Patient Education  2017 [place]. Managing Your Hypertension Hypertension is commonly called high blood pressure. This is when the force of your blood pressing against the walls of your arteries is too strong. Arteries are blood vessels that carry blood from your heart throughout your body. Hypertension forces the heart to work harder to pump blood, and may cause the arteries to become narrow or stiff. Having untreated or uncontrolled hypertension can cause heart attack, stroke, kidney disease, and other problems. What are blood pressure readings? A blood pressure reading consists of a higher number over a lower number. Ideally, your blood pressure should be below 120/80. The first ("top") number is called the systolic pressure. It is a measure of the pressure in your arteries as your heart beats. The second ("bottom") number is called the  diastolic pressure. It is a measure of the pressure in your arteries as the heart relaxes. What does my blood pressure reading mean? Blood pressure is classified into four stages. Based on your blood pressure reading, your health care provider may use the following stages to determine what type of treatment you need, if any. Systolic pressure and diastolic pressure are measured in a unit called mm Hg. Normal   Systolic pressure: below 710.  Diastolic pressure: below 80. Elevated   Systolic pressure: 626-948.  Diastolic pressure: below 80. Hypertension stage 1     Diastolic pressure: 54-62. Hypertension stage 2   Systolic pressure: 703 or above.  Diastolic pressure: 90 or above. What health risks are associated with hypertension? Managing your hypertension is an important responsibility. Uncontrolled hypertension can lead to:  A heart attack.  A stroke.  A weakened blood vessel (aneurysm).  Heart failure.  Kidney damage.  Eye damage.  Metabolic syndrome.  Memory and concentration problems. What changes can I make to manage my hypertension? Eating and drinking   Eat a diet that is high in fiber and potassium, and low in salt (sodium), added sugar, and fat. An example eating plan is called the DASH (Dietary Approaches to Stop Hypertension) diet. To eat this way:  Eat plenty of fresh fruits and vegetables. Try to fill half of your plate at each meal with fruits and vegetables.  Eat whole grains, such as whole wheat pasta, brown rice, or whole  grain bread. Fill about one quarter of your plate with whole grains.  Eat low-fat diary products.  Avoid fatty cuts of meat, processed or cured meats, and poultry with skin. Fill about one quarter of your plate with lean proteins such as fish, chicken without skin, beans, eggs, and tofu.  Avoid premade and processed foods. These tend to be higher in sodium, added sugar, and fat.     Lifestyle   Work with your health care  provider to maintain a healthy body weight, or to lose weight. Ask what an ideal weight is for you.  Get at least 30 minutes of exercise that causes your heart to beat faster (aerobic exercise) most days of the week. Activities may include walking, swimming, or biking.       Monitoring   Monitor your blood pressure at home as told by your health care provider. Your personal target blood pressure may vary depending on your medical conditions, your age, and other factors.  Have your blood pressure checked regularly, as often as told by your health care provider. Working with your health care provider   Review all the medicines you take with your health care provider because there may be side effects or interactions.  Talk with your health care provider about your diet, exercise habits, and other lifestyle factors that may be contributing to hypertension.  Visit your health care provider regularly. Your health care provider can help you create and adjust your plan for managing hypertension. Will I need medicine to control my blood pressure? Your health care provider may prescribe medicine if lifestyle changes are not enough to get your blood pressure under control, and if:  Your systolic blood pressure is 130 or higher.  Your diastolic blood pressure is 80 or higher. Take medicines only as told by your health care provider. Follow the directions carefully. Blood pressure medicines must be taken as prescribed. The medicine does not work as well when you skip doses. Skipping doses also puts you at risk for problems. Contact a health care provider if:  You think you are having a reaction to medicines you have taken.  You have repeated (recurrent) headaches.  You feel dizzy.  You have swelling in your ankles.  You have trouble with your vision. Get help right away if:  You develop a severe headache or confusion.  You have unusual weakness or numbness, or you feel faint.  You  have severe pain in your chest or abdomen.  You vomit repeatedly.  You have trouble breathing. Summary  Hypertension is when the force of blood pumping through your arteries is too strong. If this condition is not controlled, it may put you at risk for serious complications.  Your personal target blood pressure may vary depending on your medical conditions, your age, and other factors. For most people, a normal blood pressure is less than 120/80.  Hypertension is managed by lifestyle changes, medicines, or both. Lifestyle changes include weight loss, eating a healthy, low-sodium diet, exercising more, and limiting alcohol. This information is not intended to replace advice given to you by your health care provider. Make sure you discuss any questions you have with your health care provider. Document Released: 09/22/2011 Document Revised: 11/26/2015 Document Reviewed: 11/26/2015 Elsevier Interactive Patient Education  2017 [employer].

## 2016-05-20 NOTE — Progress Notes (Signed)
Subjective:    Patient ID: Cynthia Patton, female    DOB: 01/13/1960, 56 y.o.   MRN: [id number] Cynthia Patton, a 56 year old female with a history of hypertension and diabetes mellitus type 2 presents for a 1 month follow up of wrist pain and hypertension   Wrist Pain   The pain is present in the right wrist and left wrist. This is a recurrent problem. The current episode started more than 1 year ago. There has been no history of extremity trauma. The problem occurs constantly. The quality of the pain is described as aching. The pain is at a severity of 3/10. The pain is moderate. Associated symptoms include joint locking, joint swelling, a limited range of motion, numbness and stiffness. Family history does not include gout or rheumatoid arthritis. There is no history of diabetes, gout, osteoarthritis or rheumatoid arthritis.  Hypertension  Chronicity: Patient presents for a 1 month follow up of chronic conditions. The problem is uncontrolled. Pertinent negatives include no anxiety, blurred vision, chest pain, headaches, malaise/fatigue, neck pain, orthopnea, palpitations, peripheral edema, PND, shortness of breath or sweats. There are no associated agents to hypertension. Risk factors for coronary artery disease include diabetes mellitus, obesity, sedentary lifestyle and smoking/tobacco exposure. Past treatments include beta blockers. Compliance problems include diet.  There is no history of angina, kidney disease, CAD/MI, CVA, heart failure, left ventricular hypertrophy, PVD or retinopathy. Identifiable causes of hypertension include a thyroid problem (history of thyroid cancer).   Past Medical History:  Diagnosis Date  . Hypertension   . Thyroid disease    Immunization History  Administered Date(s) Administered  . Pneumococcal Polysaccharide-23 03/19/2016  . Tdap 02/09/2016   Social History   Social History  . Marital status: Single    Spouse name: N/A  . Number of children: N/A  .  Years of education: N/A   Occupational History  . Not on file.   Social History Main Topics  . Smoking status: Current Every Day Smoker    Packs/day: 0.50    Types: Cigarettes  . Smokeless tobacco: Never Used  . Alcohol use No  . Drug use: No  . Sexual activity: Not on file   Other Topics Concern  . Not on file   Social History Narrative  . No narrative on file   Review of Systems  Constitutional: Negative.  Negative for fatigue, malaise/fatigue and weight loss.  HENT: Negative.  Negative for dental problem.   Eyes: Negative for blurred vision.  Respiratory: Positive for cough. Negative for shortness of breath.   Cardiovascular: Negative for chest pain, palpitations, orthopnea and PND.  Endocrine: Negative for cold intolerance, heat intolerance, polydipsia, polyphagia and polyuria.  Musculoskeletal: Positive for arthralgias and stiffness. Negative for gout and neck pain.  Skin: Negative.   Allergic/Immunologic: Negative.  Negative for immunocompromised state.  Neurological: Positive for numbness. Negative for weakness and headaches.  Hematological: Negative.   Psychiatric/Behavioral: Negative.        Objective:   Physical Exam  Constitutional: She is oriented to person, place, and time.  HENT:  Head: Normocephalic and atraumatic.  Right Ear: External ear normal.  Left Ear: External ear normal.  Nose: Nose normal.  Mouth/Throat: Oropharynx is clear and moist.  Eyes: Conjunctivae and EOM are normal. Pupils are equal, round, and reactive to light.  Neck: Normal range of motion. Neck supple. No thyromegaly present.  Post surgical scar to neck  Cardiovascular: Normal rate, regular rhythm, normal heart sounds and intact distal  pulses.   Pulmonary/Chest: Effort normal and breath sounds normal.  Abdominal: Soft. Bowel sounds are normal.  Musculoskeletal:       Right wrist: She exhibits decreased range of motion and swelling.       Left wrist: She exhibits decreased range  of motion and swelling.  Neurological: She is alert and oriented to person, place, and time. She has normal reflexes.  Skin: Skin is warm and dry.  Psychiatric: She has a normal mood and affect. Her behavior is normal. Judgment and thought content normal.      BP (!) 178/74 (BP Location: Right Arm, Patient Position: Sitting, Cuff Size: Large) Comment: manual  Pulse 74   Temp 98 F (36.7 C) (Oral)   Resp 16   Ht 5\' 3"  (1.6 m)   Wt 214 lb (97.1 kg)   SpO2 100%   BMI 37.91 kg/m  Assessment & Plan:  1. Essential hypertension Blood pressure is above goal. Will increase diltiazem 180 mg daily. Will follow up in 1 month. The patient is asked to make an attempt to improve diet and exercise patterns to aid in medical management of this problem.  - diltiazem (DILACOR XR) 180 MG 24 hr capsule; Take 1 capsule (180 mg total) by mouth daily.  Dispense: 30 capsule; Refill: 0  2. Hyperlipidemia, unspecified hyperlipidemia type Recommend a lowfat, low carbohydrate diet divided over 5-6 small meals, increase water intake to 6-8 glasses, and 150 minutes per week of cardiovascular exercise.    - omega-3 acid ethyl esters (LOVAZA) 1 g capsule; Take 2 capsules (2 g total) by mouth 2 (two) times daily.  Dispense: 60 capsule; Refill: 5  3. Right wrist pain  - meloxicam (MOBIC) 15 MG tablet; Take 1 tablet (15 mg total) by mouth daily.  Dispense: 30 tablet; Refill: 1   RTC: F/U in 1 month for hypertension   Donia Pounds  MSN, FNP-C [facility] [place], [place] [id number] [phone]

## 2016-05-21 LAB — POCT URINALYSIS DIP (DEVICE)
BILIRUBIN URINE: NEGATIVE
GLUCOSE, UA: NEGATIVE mg/dL
KETONES UR: NEGATIVE mg/dL
LEUKOCYTES UA: NEGATIVE
Nitrite: NEGATIVE
Protein, ur: 30 mg/dL — AB
SPECIFIC GRAVITY, URINE: 1.02 (ref 1.005–1.030)
UROBILINOGEN UA: 1 mg/dL (ref 0.0–1.0)
pH: 6 (ref 5.0–8.0)

## 2016-05-24 ENCOUNTER — Encounter: Payer: Self-pay | Admitting: Gastroenterology

## 2016-05-27 ENCOUNTER — Telehealth: Payer: Self-pay

## 2016-05-27 ENCOUNTER — Other Ambulatory Visit: Payer: Self-pay

## 2016-05-27 DIAGNOSIS — E118 Type 2 diabetes mellitus with unspecified complications: Secondary | ICD-10-CM

## 2016-05-27 MED ORDER — SAXAGLIPTIN HCL 2.5 MG PO TABS
2.5000 mg | ORAL_TABLET | Freq: Every day | ORAL | 2 refills | Status: DC
Start: 2016-05-27 — End: 2016-08-30

## 2016-05-27 NOTE — Telephone Encounter (Signed)
Refill for onglyza has been sent into pharmacy. Thanks!

## 2016-06-14 ENCOUNTER — Encounter: Payer: Self-pay | Admitting: Family Medicine

## 2016-06-21 ENCOUNTER — Ambulatory Visit: Payer: Self-pay | Admitting: Family Medicine

## 2016-07-01 ENCOUNTER — Ambulatory Visit (INDEPENDENT_AMBULATORY_CARE_PROVIDER_SITE_OTHER): Payer: Self-pay | Admitting: Family Medicine

## 2016-07-01 ENCOUNTER — Encounter: Payer: Self-pay | Admitting: Family Medicine

## 2016-07-01 VITALS — BP 150/68 | HR 73 | Temp 98.6°F | Resp 14 | Ht 63.0 in | Wt 216.0 lb

## 2016-07-01 DIAGNOSIS — I10 Essential (primary) hypertension: Secondary | ICD-10-CM

## 2016-07-01 DIAGNOSIS — F32A Depression, unspecified: Secondary | ICD-10-CM

## 2016-07-01 DIAGNOSIS — M130 Polyarthritis, unspecified: Secondary | ICD-10-CM

## 2016-07-01 DIAGNOSIS — E785 Hyperlipidemia, unspecified: Secondary | ICD-10-CM

## 2016-07-01 DIAGNOSIS — E039 Hypothyroidism, unspecified: Secondary | ICD-10-CM

## 2016-07-01 DIAGNOSIS — F329 Major depressive disorder, single episode, unspecified: Secondary | ICD-10-CM

## 2016-07-01 LAB — TSH: TSH: 0.03 mIU/L — ABNORMAL LOW

## 2016-07-01 MED ORDER — ACETAMINOPHEN 500 MG PO TABS: 500.0000 mg | ORAL_TABLET | Freq: Four times a day (QID) | ORAL | 0 refills | Status: DC | PRN

## 2016-07-01 MED ORDER — DILTIAZEM HCL ER 180 MG PO CP24: 180.0000 mg | ORAL_CAPSULE | Freq: Every day | ORAL | 0 refills | Status: DC

## 2016-07-01 MED ORDER — KETOROLAC TROMETHAMINE 60 MG/2ML IM SOLN
60.0000 mg | Freq: Once | INTRAMUSCULAR | Status: AC
  Administered 2016-07-01: 60 mg via INTRAMUSCULAR

## 2016-07-01 MED ORDER — BUPROPION HCL ER (XL) 150 MG PO TB24: 150.0000 mg | ORAL_TABLET | Freq: Every day | ORAL | 1 refills | Status: DC

## 2016-07-01 MED ORDER — TRAMADOL HCL 50 MG PO TABS: 50.0000 mg | ORAL_TABLET | Freq: Three times a day (TID) | ORAL | 0 refills | Status: DC | PRN

## 2016-07-01 MED FILL — ?BUPROPION HCL XL 150 MG TA: 150 | 30 days supply | Qty: 30 | Fill #0

## 2016-07-01 MED FILL — CARTIA XT 180 MG CAPSULE SA: 180 | 30 days supply | Qty: 30 | Fill #0

## 2016-07-01 MED FILL — traMADol HCL 50 MG TABS: 50 | 5 days supply | Qty: 15 | Fill #0

## 2016-07-01 NOTE — Patient Instructions (Addendum)
Essential hypertension:  Blood pressure is not at goal. You did not take Diltiazem 180 mg daily as prescribed 1 month ago. I encourage you to take medication consistently as prescribed in order to achieve greater blood pressure control.   Arthritis:  Will discontinue Meloxicam due to uncontrolled hypertension. Also, drug was not effective in pain managment Tylenol 500 mg every 6 hours as neededfor mild to moderate pain Tramadol 50 mg every 8 hours as needed for moderate to severe pain   Depression:  Will start a course of Wellbutrin 150 mg daily for smoking cessation and depression.   Smoking cessation:  Starting a trial of Wellbutrin will also assist with smoking cessation     Bupropion extended-release tablets (Depression/Mood Disorders) What is this medicine? BUPROPION (byoo PROE pee on) is used to treat depression. This medicine may be used for other purposes; ask your health care provider or pharmacist if you have questions. COMMON BRAND NAME(S): Aplenzin, Budeprion XL, Forfivo XL, Wellbutrin XL What should I tell my health care provider before I take this medicine? They need to know if you have any of these conditions: -an eating disorder, such as anorexia or bulimia -bipolar disorder or psychosis -diabetes or high blood sugar, treated with medication -glaucoma -head injury or brain tumor -heart disease, previous heart attack, or irregular heart beat -high blood pressure -kidney or liver disease -seizures (convulsions) -suicidal thoughts or a previous suicide attempt -Tourette's syndrome -weight loss -an unusual or allergic reaction to bupropion, other medicines, foods, dyes, or preservatives -breast-feeding -pregnant or trying to become pregnant How should I use this medicine? Take this medicine by mouth with a glass of water. Follow the directions on the prescription label. You can take it with or without food. If it upsets your stomach, take it with food. Do not  crush, chew, or cut these tablets. This medicine is taken once daily at the same time each day. Do not take your medicine more often than directed. Do not stop taking this medicine suddenly except upon the advice of your doctor. Stopping this medicine too quickly may cause serious side effects or your condition may worsen. A special MedGuide will be given to you by the pharmacist with each prescription and refill. Be sure to read this information carefully each time. Talk to your pediatrician regarding the use of this medicine in children. Special care may be needed. Overdosage: If you think you have taken too much of this medicine contact a poison control center or emergency room at once. NOTE: This medicine is only for you. Do not share this medicine with others. What if I miss a dose? If you miss a dose, skip the missed dose and take your next tablet at the regular time. Do not take double or extra doses. What may interact with this medicine? Do not take this medicine with any of the following medications: -linezolid -MAOIs like Azilect, Carbex, Eldepryl, Marplan, Nardil, and Parnate -methylene blue (injected into a vein) -other medicines that contain bupropion like Zyban This medicine may also interact with the following medications: -alcohol -certain medicines for anxiety or sleep -certain medicines for blood pressure like metoprolol, propranolol -certain medicines for depression or psychotic disturbances -certain medicines for HIV or AIDS like efavirenz, lopinavir, nelfinavir, ritonavir -certain medicines for irregular heart beat like propafenone, flecainide -certain medicines for Parkinson's disease like amantadine, levodopa -certain medicines for seizures like carbamazepine, phenytoin, phenobarbital -cimetidine -clopidogrel -cyclophosphamide -digoxin -furazolidone -isoniazid -nicotine -orphenadrine -procarbazine -steroid medicines like prednisone or cortisone -stimulant  medicines for attention disorders, weight loss, or to stay awake -tamoxifen -theophylline -thiotepa -ticlopidine -tramadol -warfarin This list may not describe all possible interactions. Give your health care provider a list of all the medicines, herbs, non-prescription drugs, or dietary supplements you use. Also tell them if you smoke, drink alcohol, or use illegal drugs. Some items may interact with your medicine. What should I watch for while using this medicine? Tell your doctor if your symptoms do not get better or if they get worse. Visit your doctor or health care professional for regular checks on your progress. Because it may take several weeks to see the full effects of this medicine, it is important to continue your treatment as prescribed by your doctor. Patients and their families should watch out for new or worsening thoughts of suicide or depression. Also watch out for sudden changes in feelings such as feeling anxious, agitated, panicky, irritable, hostile, aggressive, impulsive, severely restless, overly excited and hyperactive, or not being able to sleep. If this happens, especially at the beginning of treatment or after a change in dose, call your health care professional. Avoid alcoholic drinks while taking this medicine. Drinking large amounts of alcoholic beverages, using sleeping or anxiety medicines, or quickly stopping the use of these agents while taking this medicine may increase your risk for a seizure. Do not drive or use heavy machinery until you know how this medicine affects you. This medicine can impair your ability to perform these tasks. Do not take this medicine close to bedtime. It may prevent you from sleeping. Your mouth may get dry. Chewing sugarless gum or sucking hard candy, and drinking plenty of water may help. Contact your doctor if the problem does not go away or is severe. The tablet shell for some brands of this medicine does not dissolve. This is  normal. The tablet shell may appear whole in the stool. This is not a cause for concern. What side effects may I notice from receiving this medicine? Side effects that you should report to your doctor or health care professional as soon as possible: -allergic reactions like skin rash, itching or hives, swelling of the face, lips, or tongue -breathing problems -changes in vision -confusion -elevated mood, decreased need for sleep, racing thoughts, impulsive behavior -fast or irregular heartbeat -hallucinations, loss of contact with reality -increased blood pressure -redness, blistering, peeling or loosening of the skin, including inside the mouth -seizures -suicidal thoughts or other mood changes -unusually weak or tired -vomiting Side effects that usually do not require medical attention (report to your doctor or health care professional if they continue or are bothersome): -constipation -headache -loss of appetite -nausea -tremors -weight loss This list may not describe all possible side effects. Call your doctor for medical advice about side effects. You may report side effects to FDA at 1-800-FDA-1088. Where should I keep my medicine? Keep out of the reach of children. Store at room temperature between 15 and 30 degrees C (59 and 86 degrees F). Throw away any unused medicine after the expiration date. NOTE: This sheet is a summary. It may not cover all possible information. If you have questions about this medicine, talk to your doctor, pharmacist, or health care provider.  2018 Elsevier/Gold Standard (2015-06-20 13:55:13) Diltiazem extended-release capsules or tablets What is this medicine? DILTIAZEM (dil TYE a zem) is a calcium-channel blocker. It affects the amount of calcium found in your heart and muscle cells. This relaxes your blood vessels, which can reduce the amount of work  the heart has to do. This medicine is used to treat high blood pressure and chest pain caused by  angina. This medicine may be used for other purposes; ask your health care provider or pharmacist if you have questions. COMMON BRAND NAME(S): Cardizem CD, Cardizem LA, Cardizem SR, Cartia XT, Dilacor XR, Dilt-CD, Diltia XT, Diltzac, Matzim LA, Rema Fendt, Tiamate, Tiazac What should I tell my health care provider before I take this medicine? They need to know if you have any of these conditions: -heart problems, low blood pressure, irregular heartbeat -liver disease -previous heart attack -an unusual or allergic reaction to diltiazem, other medicines, foods, dyes, or preservatives -pregnant or trying to get pregnant -breast-feeding How should I use this medicine? Take this medicine by mouth with a glass of water. Follow the directions on the prescription label. Swallow whole, do not crush or chew. Ask your doctor or pharmacist if your should take this medicine with food. Take your doses at regular intervals. Do not take your medicine more often then directed. Do not stop taking except on the advice of your doctor or health care professional. Ask your doctor or health care professional how to gradually reduce the dose. Talk to your pediatrician regarding the use of this medicine in children. Special care may be needed. Overdosage: If you think you have taken too much of this medicine contact a poison control center or emergency room at once. NOTE: This medicine is only for you. Do not share this medicine with others. What if I miss a dose? If you miss a dose, take it as soon as you can. If it is almost time for your next dose, take only that dose. Do not take double or extra doses. What may interact with this medicine? Do not take this medicine with any of the following medications: -cisapride -hawthorn -pimozide -ranolazine -red yeast rice This medicine may also interact with the following medications: -buspirone -carbamazepine -cimetidine -cyclosporine -digoxin -local anesthetics or  general anesthetics -lovastatin -medicines for anxiety or difficulty sleeping like midazolam and triazolam -medicines for high blood pressure or heart problems -quinidine -rifampin, rifabutin, or rifapentine This list may not describe all possible interactions. Give your health care provider a list of all the medicines, herbs, non-prescription drugs, or dietary supplements you use. Also tell them if you smoke, drink alcohol, or use illegal drugs. Some items may interact with your medicine. What should I watch for while using this medicine? Check your blood pressure and pulse rate regularly. Ask your doctor or health care professional what your blood pressure and pulse rate should be and when you should contact him or her. You may feel dizzy or lightheaded. Do not drive, use machinery, or do anything that needs mental alertness until you know how this medicine affects you. To reduce the risk of dizzy or fainting spells, do not sit or stand up quickly, especially if you are an older patient. Alcohol can make you more dizzy or increase flushing and rapid heartbeats. Avoid alcoholic drinks. What side effects may I notice from receiving this medicine? Side effects that you should report to your doctor or health care professional as soon as possible: -allergic reactions like skin rash, itching or hives, swelling of the face, lips, or tongue -confusion, mental depression -feeling faint or lightheaded, falls -redness, blistering, peeling or loosening of the skin, including inside the mouth -slow, irregular heartbeat -swelling of the feet and ankles -unusual bleeding or bruising, pinpoint red spots on the skin Side effects that usually  do not require medical attention (report to your doctor or health care professional if they continue or are bothersome): -constipation or diarrhea -difficulty sleeping -facial flushing -headache -nausea, vomiting -sexual dysfunction -weak or tired This list may not  describe all possible side effects. Call your doctor for medical advice about side effects. You may report side effects to FDA at 1-800-FDA-1088. Where should I keep my medicine? Keep out of the reach of children. Store at room temperature between 15 and 30 degrees C (59 and 86 degrees F). Protect from humidity. Throw away any unused medicine after the expiration date. NOTE: This sheet is a summary. It may not cover all possible information. If you have questions about this medicine, talk to your doctor, pharmacist, or health care provider.  2018 Elsevier/Gold Standard (2007-04-20 14:35:47)

## 2016-07-01 NOTE — Progress Notes (Signed)
Subjective:    Patient ID: Cynthia Patton, female    DOB: [birth year], 56 y.o.   MRN: [id number] Ms. Cynthia Patton, a 56 year old female with a history of hypertension and diabetes mellitus type 2 presents for a 1 month follow up of wrist pain and hypertension. She was unable to pick up antihypertensive medications last month due to financial constraints. She continued to take previous dose of diliazem for uncontrolled hypertension.    Hypertension  Chronicity: Patient presents for a 1 month follow up of chronic conditions. The problem is uncontrolled. Pertinent negatives include no anxiety, blurred vision, chest pain, headaches, malaise/fatigue, neck pain, orthopnea, palpitations, peripheral edema, PND, shortness of breath or sweats. There are no associated agents to hypertension. Risk factors for coronary artery disease include diabetes mellitus, obesity, sedentary lifestyle and smoking/tobacco exposure. Past treatments include beta blockers. Compliance problems include diet.  There is no history of angina, kidney disease, CAD/MI, CVA, heart failure, left ventricular hypertrophy, PVD or retinopathy. Identifiable causes of hypertension include a thyroid problem (history of thyroid cancer).  Wrist Pain   The pain is present in the right wrist and left wrist. This is a recurrent problem. The current episode started more than 1 year ago. There has been no history of extremity trauma. The problem occurs constantly. The quality of the pain is described as aching. The pain is at a severity of 3/10. The pain is moderate. Associated symptoms include joint locking, joint swelling, a limited range of motion, numbness and stiffness. Family history does not include gout or rheumatoid arthritis. There is no history of diabetes, gout, osteoarthritis or rheumatoid arthritis.   Past Medical History:  Diagnosis Date  . Hypertension   . Thyroid disease    Immunization History  Administered Date(s) Administered  .  Pneumococcal Polysaccharide-23 03/19/2016  . Tdap 02/09/2016   Social History   Social History  . Marital status: Single    Spouse name: N/A  . Number of children: N/A  . Years of education: N/A   Occupational History  . Not on file.   Social History Main Topics  . Smoking status: Current Every Day Smoker    Packs/day: 0.50    Types: Cigarettes  . Smokeless tobacco: Never Used  . Alcohol use No  . Drug use: No  . Sexual activity: Not on file   Other Topics Concern  . Not on file   Social History Narrative  . No narrative on file   Review of Systems  Constitutional: Negative.  Negative for fatigue and malaise/fatigue.  HENT: Negative.  Negative for dental problem.   Eyes: Negative for blurred vision.  Respiratory: Positive for cough. Negative for shortness of breath.   Cardiovascular: Negative for chest pain, palpitations, orthopnea and PND.  Endocrine: Negative for cold intolerance, heat intolerance, polydipsia, polyphagia and polyuria.  Musculoskeletal: Positive for arthralgias and stiffness. Negative for gout and neck pain.  Skin: Negative.   Allergic/Immunologic: Negative.  Negative for immunocompromised state.  Neurological: Positive for numbness. Negative for weakness and headaches.  Hematological: Negative.   Psychiatric/Behavioral: Negative.        Objective:   Physical Exam  Constitutional: She is oriented to person, place, and time.  HENT:  Head: Normocephalic and atraumatic.  Right Ear: External ear normal.  Left Ear: External ear normal.  Nose: Nose normal.  Mouth/Throat: Oropharynx is clear and moist.  Eyes: Conjunctivae and EOM are normal. Pupils are equal, round, and reactive to light.  Neck: Normal range of  motion. Neck supple. No thyromegaly present.  Post surgical scar to neck  Cardiovascular: Normal rate, regular rhythm, normal heart sounds and intact distal pulses.   Pulmonary/Chest: Effort normal and breath sounds normal.  Abdominal:  Soft. Bowel sounds are normal.  Musculoskeletal:       Right wrist: She exhibits decreased range of motion and swelling.       Left wrist: She exhibits decreased range of motion and swelling.  Neurological: She is alert and oriented to person, place, and time. She has normal reflexes.  Skin: Skin is warm and dry.  Psychiatric: She has a normal mood and affect. Her behavior is normal. Judgment and thought content normal.      BP (!) 152/70 (BP Location: Right Arm, Patient Position: Sitting, Cuff Size: Large) Comment: manual  Pulse 73   Temp 98.6 F (37 C) (Oral)   Resp 14   Ht 5\' 3"  (1.6 m)   Wt 216 lb (98 kg)   SpO2 98%   BMI 38.26 kg/m  Assessment & Plan:  1. Essential hypertension - COMPLETE METABOLIC PANEL WITH GFR - diltiazem (DILACOR XR) 180 MG 24 hr capsule; Take 1 capsule (180 mg total) by mouth daily.  Dispense: 30 capsule; Refill: 0  2. Depression, unspecified depression type Patient was very tearful when reviewing depression screen. She says that she is having a difficult time with her job loss and having to move into her sister's home. . She denies suicidal or homicidal ideations - buPROPion (WELLBUTRIN XL) 150 MG 24 hr tablet; Take 1 tablet (150 mg total) by mouth daily.  Dispense: 30 tablet; Refill: 1 Depression screen [facility] 2/9 07/01/2016 07/01/2016 05/20/2016 04/13/2016 02/04/2016  Decreased Interest 0 2 0 0 0  Down, Depressed, Hopeless 0 2 0 0 0  PHQ - 2 Score 0 4 0 0 0  Altered sleeping - 0 - - -  Tired, decreased energy - 1 - - -  Change in appetite - 0 - - -  Feeling bad or failure about yourself  - 1 - - -  Trouble concentrating - 1 - - -  Moving slowly or fidgety/restless - 0 - - -  Suicidal thoughts - 0 - - -  PHQ-9 Score - 7 - - -    3. Hyperlipidemia, unspecified hyperlipidemia type The 10-year ASCVD risk score Mikey Bussing DC Jr., et al., 2013) is: 49.6%   Values used to calculate the score:     Age: 21 years     Sex: Female     Is Non-Hispanic African  American: Yes     Diabetic: Yes     Tobacco smoker: Yes     Systolic Blood Pressure: 875 mmHg     Is BP treated: Yes     HDL Cholesterol: 32 mg/dL     Total Cholesterol: 187 mg/dL   4. Hypothyroidism, unspecified type - TSH  5. Arthritis of multiple sites - acetaminophen (TYLENOL) 500 MG tablet; Take 1 tablet (500 mg total) by mouth every 6 (six) hours as needed.  Dispense: 30 tablet; Refill: 0 - traMADol (ULTRAM) 50 MG tablet; Take 1 tablet (50 mg total) by mouth every 8 (eight) hours as needed.  Dispense: 15 tablet; Refill: 0 - Sedimentation rate - ketorolac (TORADOL) injection 60 mg; Inject 2 mLs (60 mg total) into the muscle once.    RTC: 1 month for hypertension and depression   Liesa Tsan Al Decant  MSN, FNP-C [place],  [place] [address] [phone]

## 2016-07-02 ENCOUNTER — Other Ambulatory Visit: Payer: Self-pay | Admitting: Family Medicine

## 2016-07-02 DIAGNOSIS — E038 Other specified hypothyroidism: Secondary | ICD-10-CM

## 2016-07-02 LAB — COMPLETE METABOLIC PANEL WITH GFR
ALT: 9 U/L (ref 6–29)
AST: 11 U/L (ref 10–35)
Albumin: 3.7 g/dL (ref 3.6–5.1)
Alkaline Phosphatase: 90 U/L (ref 33–130)
BUN: 14 mg/dL (ref 7–25)
CHLORIDE: 108 mmol/L (ref 98–110)
CO2: 21 mmol/L (ref 20–31)
Calcium: 8.9 mg/dL (ref 8.6–10.4)
Creat: 0.88 mg/dL (ref 0.50–1.05)
GFR, Est African American: 84 mL/min (ref >=60)
GFR, Est Non African American: 73 mL/min (ref >=60)
Glucose, Bld: 119 mg/dL — ABNORMAL HIGH (ref 65–99)
Potassium: 3.8 mmol/L (ref 3.5–5.3)
Sodium: 140 mmol/L (ref 135–146)
Total Bilirubin: 0.4 mg/dL (ref 0.2–1.2)
Total Protein: 6.8 g/dL (ref 6.1–8.1)

## 2016-07-02 LAB — SEDIMENTATION RATE: SED RATE: 12 mm/h (ref 0–30)

## 2016-07-02 MED ORDER — LEVOTHYROXINE SODIUM 175 MCG PO TABS: 175.0000 ug | ORAL_TABLET | Freq: Every day | ORAL | 5 refills | Status: DC

## 2016-07-02 NOTE — Progress Notes (Signed)
Ms. Mikeyla Music, a 56 year old female with a history of hypothyroidism presented on 6/21 for a follow up of chronic conditions. Reviewed labs, TSH is currently 0.03. I will decrease current dosage of levothyroxine 200 mcg to 175 mcg. Will recheck TSH in 6 weeks.    Donia Pounds  MSN, FNP-C [facility] [address] [place], [place] [address] [phone]

## 2016-07-02 NOTE — Progress Notes (Signed)
Called and spoke with patient, advised to stop current dose of 246mcg of levothyroxine and start 123mcg once daily as directed. Patient verbalized understanding and appointment was scheduled for 6 weeks out for re-check. Thanks!

## 2016-08-05 ENCOUNTER — Ambulatory Visit: Payer: Self-pay | Admitting: Family Medicine

## 2016-08-12 ENCOUNTER — Telehealth: Payer: Self-pay

## 2016-08-12 DIAGNOSIS — I10 Essential (primary) hypertension: Secondary | ICD-10-CM

## 2016-08-12 MED ORDER — DILTIAZEM HCL ER 180 MG PO CP24: 180.0000 mg | ORAL_CAPSULE | Freq: Every day | ORAL | 0 refills | Status: DC

## 2016-08-12 NOTE — Telephone Encounter (Signed)
Refill request for diltiazem sent into pharmacy.

## 2016-08-13 ENCOUNTER — Other Ambulatory Visit: Payer: Self-pay | Admitting: Family Medicine

## 2016-08-13 ENCOUNTER — Other Ambulatory Visit (INDEPENDENT_AMBULATORY_CARE_PROVIDER_SITE_OTHER): Payer: Self-pay

## 2016-08-13 DIAGNOSIS — E039 Hypothyroidism, unspecified: Secondary | ICD-10-CM

## 2016-08-14 ENCOUNTER — Other Ambulatory Visit: Payer: Self-pay | Admitting: Family Medicine

## 2016-08-14 DIAGNOSIS — E038 Other specified hypothyroidism: Secondary | ICD-10-CM

## 2016-08-14 LAB — THYROID PANEL WITH TSH
Free Thyroxine Index: 4.7 — ABNORMAL HIGH (ref 1.4–3.8)
T3 Uptake: 32 % (ref 22–35)
T4, Total: 14.6 ug/dL — ABNORMAL HIGH (ref 4.5–12.0)
TSH: 0.12 m[IU]/L — AB

## 2016-08-14 MED ORDER — LEVOTHYROXINE SODIUM 75 MCG PO TABS: 75.0000 ug | ORAL_TABLET | Freq: Every day | ORAL | 1 refills | Status: DC

## 2016-08-14 NOTE — Progress Notes (Signed)
Meds ordered this encounter  Medications  . levothyroxine (SYNTHROID, LEVOTHROID) 75 MCG tablet    Sig: Take 1 tablet (75 mcg total) by mouth daily before breakfast.    Dispense:  42 tablet    Refill:  [place]  MSN, FNP-C [facility] [address] [place], [place] [address] [phone]

## 2016-08-25 ENCOUNTER — Other Ambulatory Visit: Payer: Self-pay

## 2016-08-25 ENCOUNTER — Telehealth: Payer: Self-pay

## 2016-08-25 DIAGNOSIS — E038 Other specified hypothyroidism: Secondary | ICD-10-CM

## 2016-08-25 MED ORDER — LEVOTHYROXINE SODIUM 75 MCG PO TABS: 75.0000 ug | ORAL_TABLET | Freq: Every day | ORAL | 1 refills | Status: DC

## 2016-08-25 NOTE — Telephone Encounter (Signed)
Spoke with patient advised of lab results. And advised of medication change and sent into correct pharmacy. Thanks!

## 2016-08-25 NOTE — Telephone Encounter (Signed)
Thyroid medication sent into [facility] for pick up .

## 2016-08-30 ENCOUNTER — Ambulatory Visit (INDEPENDENT_AMBULATORY_CARE_PROVIDER_SITE_OTHER): Payer: Self-pay | Admitting: Family Medicine

## 2016-08-30 ENCOUNTER — Encounter: Payer: Self-pay | Admitting: Family Medicine

## 2016-08-30 VITALS — BP 146/68 | Temp 97.9°F | Resp 16 | Ht 63.0 in | Wt 218.0 lb

## 2016-08-30 DIAGNOSIS — R319 Hematuria, unspecified: Secondary | ICD-10-CM

## 2016-08-30 DIAGNOSIS — R350 Frequency of micturition: Secondary | ICD-10-CM

## 2016-08-30 DIAGNOSIS — I10 Essential (primary) hypertension: Secondary | ICD-10-CM

## 2016-08-30 DIAGNOSIS — E118 Type 2 diabetes mellitus with unspecified complications: Secondary | ICD-10-CM

## 2016-08-30 DIAGNOSIS — E0590 Thyrotoxicosis, unspecified without thyrotoxic crisis or storm: Secondary | ICD-10-CM

## 2016-08-30 DIAGNOSIS — L209 Atopic dermatitis, unspecified: Secondary | ICD-10-CM

## 2016-08-30 LAB — POCT GLYCOSYLATED HEMOGLOBIN (HGB A1C): Hemoglobin A1C: 7.3

## 2016-08-30 MED ORDER — SAXAGLIPTIN HCL 2.5 MG PO TABS: 2.5000 mg | ORAL_TABLET | Freq: Every day | ORAL | 2 refills | Status: DC

## 2016-08-30 NOTE — Patient Instructions (Addendum)
Your hemoglobin a1C is 7.3, which is consistent with type 2 diabetes mellitus. Onglyza 2.5 mg daily.  Will check thyroid levels in 4 weeks.  Will send referral to endocrinology when insurance becomes available.  You have a small amount of blood in urine, will schedule and abdominal ultrasound.   Carbohydrate Counting for Diabetes Mellitus, Adult Carbohydrate counting is a method for keeping track of how many carbohydrates you eat. Eating carbohydrates naturally increases the amount of sugar (glucose) in the blood. Counting how many carbohydrates you eat helps keep your blood glucose within normal limits, which helps you manage your diabetes (diabetes mellitus). It is important to know how many carbohydrates you can safely have in each meal. This is different for every person. A diet and nutrition specialist (registered dietitian) can help you make a meal plan and calculate how many carbohydrates you should have at each meal and snack. Carbohydrates are found in the following foods:  Grains, such as breads and cereals.  Dried beans and soy products.  Starchy vegetables, such as potatoes, peas, and corn.  Fruit and fruit juices.  Milk and yogurt.  Sweets and snack foods, such as cake, cookies, candy, chips, and soft drinks.  How do I count carbohydrates? There are two ways to count carbohydrates in food. You can use either of the methods or a combination of both. Reading "Nutrition Facts" on packaged food The "Nutrition Facts" list is included on the labels of almost all packaged foods and beverages in the [place] It includes:  The serving size.  Information about nutrients in each serving, including the grams (g) of carbohydrate per serving.  To use the "Nutrition Facts":  Decide how many servings you will have.  Multiply the number of servings by the number of carbohydrates per serving.  The resulting number is the total amount of carbohydrates that you will be having.  Learning  standard serving sizes of other foods When you eat foods containing carbohydrates that are not packaged or do not include "Nutrition Facts" on the label, you need to measure the servings in order to count the amount of carbohydrates:  Measure the foods that you will eat with a food scale or measuring cup, if needed.  Decide how many standard-size servings you will eat.  Multiply the number of servings by 15. Most carbohydrate-rich foods have about 15 g of carbohydrates per serving. ? For example, if you eat 8 oz (170 g) of strawberries, you will have eaten 2 servings and 30 g of carbohydrates (2 servings x 15 g = 30 g).  For foods that have more than one food mixed, such as soups and casseroles, you must count the carbohydrates in each food that is included.  The following list contains standard serving sizes of common carbohydrate-rich foods. Each of these servings has about 15 g of carbohydrates:   hamburger bun or  English muffin.   oz (15 mL) syrup.   oz (14 g) jelly.  1 slice of bread.  1 six-inch tortilla.  3 oz (85 g) cooked rice or pasta.  4 oz (113 g) cooked dried beans.  4 oz (113 g) starchy vegetable, such as peas, corn, or potatoes.  4 oz (113 g) hot cereal.  4 oz (113 g) mashed potatoes or  of a large baked potato.  4 oz (113 g) canned or frozen fruit.  4 oz (120 mL) fruit juice.  4-6 crackers.  6 chicken nuggets.  6 oz (170 g) unsweetened dry cereal.  6  oz (170 g) plain fat-free yogurt or yogurt sweetened with artificial sweeteners.  8 oz (240 mL) milk.  8 oz (170 g) fresh fruit or one small piece of fruit.  24 oz (680 g) popped popcorn.  Example of carbohydrate counting Sample meal  3 oz (85 g) chicken breast.  6 oz (170 g) brown rice.  4 oz (113 g) corn.  8 oz (240 mL) milk.  8 oz (170 g) strawberries with sugar-free whipped topping. Carbohydrate calculation 1. Identify the foods that contain  carbohydrates: ? Rice. ? Corn. ? Milk. ? Strawberries. 2. Calculate how many servings you have of each food: ? 2 servings rice. ? 1 serving corn. ? 1 serving milk. ? 1 serving strawberries. 3. Multiply each number of servings by 15 g: ? 2 servings rice x 15 g = 30 g. ? 1 serving corn x 15 g = 15 g. ? 1 serving milk x 15 g = 15 g. ? 1 serving strawberries x 15 g = 15 g. 4. Add together all of the amounts to find the total grams of carbohydrates eaten: ? 30 g + 15 g + 15 g + 15 g = 75 g of carbohydrates total. This information is not intended to replace advice given to you by your health care provider. Make sure you discuss any questions you have with your health care provider. Document Released: 12/28/2004 Document Revised: 07/18/2015 Document Reviewed: 06/11/2015 Elsevier Interactive Patient Education  [employer].

## 2016-09-01 ENCOUNTER — Ambulatory Visit (HOSPITAL_COMMUNITY): Payer: Self-pay

## 2016-09-01 ENCOUNTER — Other Ambulatory Visit: Payer: Self-pay

## 2016-09-01 DIAGNOSIS — E118 Type 2 diabetes mellitus with unspecified complications: Secondary | ICD-10-CM

## 2016-09-01 LAB — POCT URINALYSIS DIP (DEVICE)
Bilirubin Urine: NEGATIVE
GLUCOSE, UA: 250 mg/dL — AB
KETONES UR: NEGATIVE mg/dL
LEUKOCYTES UA: NEGATIVE
Nitrite: NEGATIVE
PROTEIN: NEGATIVE mg/dL
UROBILINOGEN UA: 0.2 mg/dL (ref 0.0–1.0)
pH: 5.5 (ref 5.0–8.0)

## 2016-09-01 MED ORDER — SAXAGLIPTIN HCL 2.5 MG PO TABS: 2.5000 mg | ORAL_TABLET | Freq: Every day | ORAL | 2 refills | Status: DC

## 2016-09-01 NOTE — Telephone Encounter (Signed)
[facility]

## 2016-09-02 NOTE — Progress Notes (Signed)
Subjective:    Patient ID: Cynthia Patton, female    DOB: 1960-09-01, 56 y.o.   MRN: [id number]  Chief Complaint  Patient presents with  . Rash    ruff rash on right arm   . Back Pain    lower back pain .      Ms. Cynthia Patton, a 56 year old female with a history of hypertension and diabetes mellitus type 2 presents for a 1 month follow up. She is complaining of periodic low back pain and a rash to forearms.    Hypertension  Chronicity: Patient presents for a 1 month follow up of chronic conditions. The problem is uncontrolled. Pertinent negatives include no anxiety, blurred vision, chest pain, headaches, malaise/fatigue, neck pain, orthopnea, palpitations, peripheral edema, PND, shortness of breath or sweats. There are no associated agents to hypertension. Risk factors for coronary artery disease include diabetes mellitus, obesity, sedentary lifestyle and smoking/tobacco exposure. Past treatments include beta blockers. Compliance problems include diet.  There is no history of angina, kidney disease, CAD/MI, CVA, heart failure, left ventricular hypertrophy, PVD or retinopathy. Identifiable causes of hypertension include a thyroid problem (history of thyroid cancer).  Rash  This is a new problem. The current episode started in the past 7 days. The problem has been resolved since onset. The affected locations include the left elbow, left arm, right arm and right elbow. Pertinent negatives include no fatigue, fever or shortness of breath. Past treatments include nothing. Her past medical history is significant for eczema. There is no history of allergies or asthma.  Back Pain  This is a recurrent problem. The problem occurs daily. The problem has been waxing and waning since onset. The quality of the pain is described as aching. The pain is at a severity of 3/10. The pain is mild. The symptoms are aggravated by bending, position and lying down. Associated symptoms include dysuria. Pertinent  negatives include no abdominal pain, bladder incontinence, bowel incontinence, chest pain, fever, headaches, leg pain, numbness, paresis, paresthesias or weakness.   Past Medical History:  Diagnosis Date  . Hypertension   . Thyroid disease    Immunization History  Administered Date(s) Administered  . Pneumococcal Polysaccharide-23 03/19/2016  . Tdap 02/09/2016   Social History   Social History  . Marital status: Single    Spouse name: N/A  . Number of children: N/A  . Years of education: N/A   Occupational History  . Not on file.   Social History Main Topics  . Smoking status: Current Every Day Smoker    Packs/day: 0.50    Types: Cigarettes  . Smokeless tobacco: Never Used  . Alcohol use No  . Drug use: No  . Sexual activity: Not on file   Other Topics Concern  . Not on file   Social History Narrative  . No narrative on file   Review of Systems  Constitutional: Negative.  Negative for fatigue, fever and malaise/fatigue.  HENT: Negative.  Negative for dental problem.   Eyes: Negative for blurred vision.  Respiratory: Negative for shortness of breath.   Cardiovascular: Negative for chest pain, palpitations, orthopnea and PND.  Gastrointestinal: Negative for abdominal pain and bowel incontinence.  Endocrine: Negative for cold intolerance, heat intolerance, polydipsia, polyphagia and polyuria.  Genitourinary: Positive for dysuria. Negative for bladder incontinence.  Musculoskeletal: Positive for arthralgias and back pain. Negative for neck pain.  Skin: Positive for rash.  Allergic/Immunologic: Negative.  Negative for immunocompromised state.  Neurological: Negative for weakness, numbness, headaches  and paresthesias.  Hematological: Negative.   Psychiatric/Behavioral: Negative.        Objective:   Physical Exam  Constitutional: She is oriented to person, place, and time.  HENT:  Head: Normocephalic and atraumatic.  Right Ear: External ear normal.  Left Ear:  External ear normal.  Nose: Nose normal.  Mouth/Throat: Oropharynx is clear and moist.  Eyes: Pupils are equal, round, and reactive to light. Conjunctivae and EOM are normal.  Neck: Normal range of motion. Neck supple. No thyromegaly present.  Post surgical scar to neck  Cardiovascular: Normal rate, regular rhythm, normal heart sounds and intact distal pulses.   Pulmonary/Chest: Effort normal and breath sounds normal.  Abdominal: Soft. Bowel sounds are normal.  Musculoskeletal:       Lumbar back: She exhibits normal range of motion, no tenderness, no pain and no spasm.  Neurological: She is alert and oriented to person, place, and time. She has normal reflexes.  Skin: Skin is warm and dry.  Psychiatric: She has a normal mood and affect. Her behavior is normal. Judgment and thought content normal.      BP (!) 146/68 (BP Location: Left Arm, Patient Position: Sitting, Cuff Size: Large) Comment: manually  Temp 97.9 F (36.6 C) (Oral)   Resp 16   Ht 5\' 3"  (1.6 m)   Wt 218 lb (98.9 kg)   SpO2 100%   BMI 38.62 kg/m  Assessment & Plan:  1. Urinary frequency Mild hematuria, An appt was scheduled for a abdominal ultrasound. Patient did not follow up due to transportation constraints. Will schedule ultrasound. Continue to increase hydration.   2. Essential hypertension Blood pressure has improved, no changes warranted today.  - POCT urinalysis dip (device)  3. Hyperthyroidism Will repeat thyroid panel in 6 weeks. Will continue levothyroxine at current dosage.   4. Controlled type 2 diabetes mellitus with complication, without long-term current use of insulin (HCC) - HgB A1c - POCT urinalysis dip (device)  5. Atopic dermatitis, unspecified type Apply hydrocortisone 1% to forearms twice daily as needed.  Refrain from scratching  6. Hematuria, unspecified type - US Abdomen Complete; Future   RTC: 6 weeks for thyroid panel   Donia Pounds  MSN, FNP-C [facility] [address] [place], [place] [address] [phone]

## 2016-09-03 ENCOUNTER — Ambulatory Visit (HOSPITAL_COMMUNITY)
Admission: RE | Admit: 2016-09-03 | Discharge: 2016-09-03 | Disposition: A | Discharge status: Home / Self Care | Payer: Self-pay | Source: Ambulatory Visit | Attending: Family Medicine | Admitting: Family Medicine

## 2016-09-03 ENCOUNTER — Telehealth: Payer: Self-pay | Admitting: Family Medicine

## 2016-09-03 ENCOUNTER — Telehealth: Payer: Self-pay

## 2016-09-03 DIAGNOSIS — N281 Cyst of kidney, acquired: Secondary | ICD-10-CM | POA: Insufficient documentation

## 2016-09-03 DIAGNOSIS — R319 Hematuria, unspecified: Secondary | ICD-10-CM | POA: Insufficient documentation

## 2016-09-03 DIAGNOSIS — Z9049 Acquired absence of other specified parts of digestive tract: Secondary | ICD-10-CM | POA: Insufficient documentation

## 2016-09-03 NOTE — Telephone Encounter (Signed)
Patient is requesting results of ultrasound. Please advise. Thanks!

## 2016-09-03 NOTE — Telephone Encounter (Signed)
Cynthia Patton, a 56 year old female with a history of uncontrolled hypertension was evaluated in clinic on 08/30/2016. She has ongoing microscopic hematuria and was sent for an abdominal ultrasound. The following results were discussed with patient:   IMPRESSION: 1. Right renal 3.4 cm cyst with thin septation, most likely a benign cyst.  2.  Cholecystectomy.  No biliary distention.   Donia Pounds  MSN, FNP-C [facility] [address] [place], [place] [address] [phone]

## 2016-09-23 ENCOUNTER — Telehealth: Payer: Self-pay

## 2016-09-23 DIAGNOSIS — I10 Essential (primary) hypertension: Secondary | ICD-10-CM

## 2016-09-23 MED ORDER — DILTIAZEM HCL ER 180 MG PO CP24: 180.0000 mg | ORAL_CAPSULE | Freq: Every day | ORAL | 0 refills | Status: DC

## 2016-09-23 NOTE — Telephone Encounter (Signed)
Refill for diltiazem sent into pharmacy. Thanks!

## 2016-10-14 ENCOUNTER — Encounter: Payer: Self-pay | Admitting: Family Medicine

## 2016-10-14 ENCOUNTER — Ambulatory Visit (INDEPENDENT_AMBULATORY_CARE_PROVIDER_SITE_OTHER): Payer: Self-pay | Admitting: Family Medicine

## 2016-10-14 ENCOUNTER — Ambulatory Visit (HOSPITAL_COMMUNITY)
Admission: RE | Admit: 2016-10-14 | Discharge: 2016-10-14 | Disposition: A | Discharge status: Home / Self Care | Payer: Self-pay | Source: Ambulatory Visit | Attending: Family Medicine | Admitting: Family Medicine

## 2016-10-14 VITALS — BP 164/78 | HR 86 | Temp 98.5°F | Resp 16 | Ht 63.0 in | Wt 223.0 lb

## 2016-10-14 DIAGNOSIS — R0602 Shortness of breath: Secondary | ICD-10-CM | POA: Insufficient documentation

## 2016-10-14 DIAGNOSIS — R0989 Other specified symptoms and signs involving the circulatory and respiratory systems: Secondary | ICD-10-CM

## 2016-10-14 DIAGNOSIS — I10 Essential (primary) hypertension: Secondary | ICD-10-CM

## 2016-10-14 DIAGNOSIS — R0981 Nasal congestion: Secondary | ICD-10-CM

## 2016-10-14 DIAGNOSIS — R062 Wheezing: Secondary | ICD-10-CM | POA: Insufficient documentation

## 2016-10-14 DIAGNOSIS — E039 Hypothyroidism, unspecified: Secondary | ICD-10-CM

## 2016-10-14 DIAGNOSIS — E118 Type 2 diabetes mellitus with unspecified complications: Secondary | ICD-10-CM

## 2016-10-14 LAB — POCT URINALYSIS DIP (DEVICE)
Bilirubin Urine: NEGATIVE
GLUCOSE, UA: NEGATIVE mg/dL
Ketones, ur: NEGATIVE mg/dL
LEUKOCYTES UA: NEGATIVE
NITRITE: NEGATIVE
Protein, ur: NEGATIVE mg/dL
Urobilinogen, UA: 0.2 mg/dL (ref 0.0–1.0)
pH: 5.5 (ref 5.0–8.0)

## 2016-10-14 LAB — BASIC METABOLIC PANEL WITH GFR
BUN: 8 mg/dL (ref 7–25)
CALCIUM: 9.1 mg/dL (ref 8.6–10.4)
CHLORIDE: 106 mmol/L (ref 98–110)
CO2: 25 mmol/L (ref 20–32)
Creat: 0.9 mg/dL (ref 0.50–1.05)
GFR, Est African American: 82 mL/min/{1.73_m2} (ref >=60)
GFR, Est Non African American: 71 mL/min/{1.73_m2} (ref >=60)
GLUCOSE: 121 mg/dL — AB (ref 65–99)
POTASSIUM: 3.9 mmol/L (ref 3.5–5.3)
Sodium: 141 mmol/L (ref 135–146)

## 2016-10-14 LAB — THYROID PANEL WITH TSH
FREE THYROXINE INDEX: 2 (ref 1.4–3.8)
T3 Uptake: 24 % (ref 22–35)
T4 TOTAL: 8.4 ug/dL (ref 5.1–11.9)
TSH: 23.05 mIU/L — ABNORMAL HIGH (ref 0.40–4.50)

## 2016-10-14 LAB — POCT GLYCOSYLATED HEMOGLOBIN (HGB A1C): Hemoglobin A1C: 7.1

## 2016-10-14 MED ORDER — PROMETHAZINE HCL 6.25 MG/5ML PO SYRP: 6.2500 mg | ORAL_SOLUTION | Freq: Four times a day (QID) | ORAL | 0 refills | Status: DC | PRN

## 2016-10-14 MED ORDER — IPRATROPIUM BROMIDE 0.02 % IN SOLN
0.5000 mg | Freq: Once | RESPIRATORY_TRACT | Status: AC
  Administered 2016-10-14: 0.5 mg via RESPIRATORY_TRACT

## 2016-10-14 MED ORDER — ALBUTEROL SULFATE (2.5 MG/3ML) 0.083% IN NEBU
2.5000 mg | INHALATION_SOLUTION | Freq: Once | RESPIRATORY_TRACT | Status: AC
  Administered 2016-10-14: 2.5 mg via RESPIRATORY_TRACT

## 2016-10-14 MED ORDER — MOMETASONE FUROATE 50 MCG/ACT NA SUSP: 2.0000 | Freq: Every day | NASAL | 1 refills | Status: DC

## 2016-10-14 MED ORDER — ALBUTEROL SULFATE HFA 108 (90 BASE) MCG/ACT IN AERS
2.0000 | INHALATION_SPRAY | RESPIRATORY_TRACT | 1 refills | Status: DC | PRN
Start: 2016-10-14 — End: 2018-02-16

## 2016-10-14 MED ORDER — AZITHROMYCIN 250 MG PO TABS: ORAL_TABLET | ORAL | 0 refills | Status: DC

## 2016-10-14 NOTE — Progress Notes (Signed)
Subjective:     Cynthia Patton is a 56 y.o. female who presents for evaluation of upper respiratory symptoms that have been worsening over the past 10 days. She endorses productive cough, SOB, wheezing, nasal congestion, sore throat, and fatigue. She says that she has increased fluid intake and has been using albuterol inhaler without relief. She says that she had fever at the initial onset of symptoms.   Patient also presents for a follow up of chronic conditions. She has a history of hypertension. She says that she was rushing out of the house and did not take antihypertensive medications prior to arrival. She says that she typically takes medications consistently every morning before work. She has not been exercising routinely or following a low fat, low sodium diet. She denies chest pain, lower extremity edema, syncope, or heart palpitations. She is a chronic every day smoker and has been over the past 35 years.  Cynthia Patton has a history of hypothyroidism. Levothyroixine was lowered 1 month ago due to decreased TSH. She denies constipation, fatigue, hair loss, depression. She endorses weight gain. She is s/p thyroidectomy.  Past Medical History:  Diagnosis Date  . Hypertension   . Thyroid disease    Immunization History  Administered Date(s) Administered  . Pneumococcal Polysaccharide-23 03/19/2016  . Tdap 02/09/2016   Social History   Social History  . Marital status: Single    Spouse name: N/A  . Number of children: N/A  . Years of education: N/A   Occupational History  . Not on file.   Social History Main Topics  . Smoking status: Current Every Day Smoker    Packs/day: 0.50    Types: Cigarettes  . Smokeless tobacco: Never Used  . Alcohol use No  . Drug use: No  . Sexual activity: Not on file   Other Topics Concern  . Not on file   Social History Narrative  . No narrative on file    Review of Systems  Constitutional: Negative for diaphoresis, malaise/fatigue and  weight loss.  HENT: Positive for congestion and sore throat. Negative for ear discharge, ear pain, sinus pain and tinnitus.   Eyes: Negative.   Respiratory: Positive for cough, sputum production, shortness of breath and wheezing.   Cardiovascular: Negative.   Gastrointestinal: Negative.   Genitourinary: Negative.   Musculoskeletal: Negative.   Skin: Negative.    Objective:  Physical Exam  Constitutional: She is oriented to person, place, and time.  HENT:  Nose: Mucosal edema present.  Mouth/Throat: Oropharyngeal exudate and posterior oropharyngeal erythema present.  Eyes: Pupils are equal, round, and reactive to light.  Neck: Normal range of motion. Neck supple.  Cardiovascular: Normal rate, regular rhythm, normal heart sounds and intact distal pulses.   Pulmonary/Chest: Effort normal and breath sounds normal.  Abdominal: Soft. Bowel sounds are normal.  Neurological: She is alert and oriented to person, place, and time. Gait normal.  Skin: Skin is warm and dry.  Psychiatric: Mood, memory, affect and judgment normal.   Assessment:  BP (!) 164/78 (BP Location: Right Arm, Patient Position: Sitting, Cuff Size: Large) Comment: manually  Pulse 86   Temp 98.5 F (36.9 C) (Oral)   Resp 16   Ht 5\' 3"  (1.6 m)   Wt 223 lb (101.2 kg)   SpO2 96%   BMI 39.50 kg/m  Plan:  1. Essential hypertension Blood pressure is above goal.  [name] did not take medications prior to arrival  - BASIC METABOLIC PANEL WITH GFR - POCT  urinalysis dip (device)  2. Acquired hypothyroidism - Thyroid Panel With TSH  3. Controlled type 2 diabetes mellitus with complication, without long-term current use of insulin (HCC) Your A1C goal is less than 7. Your fasting blood sugar  Upon awakening goal is between 110-140.  Your LDL  (bad cholesterol goal is less than 100 Blood pressure goal is <140/90.  Recommend a lowfat, low carbohydrate diet divided over 5-6 small meals, increase water intake to 6-8  glasses, and 150 minutes per week of cardiovascular exercise.   Take your medications as prescribed Make sure that you are familiar with each one of your medications and what they are used to treat.  If you are unsure of medications, please bring to follow up Will send referral for eye exam  Please keep your scheduled follow up appointment.   - HgB A1c  4. Symptoms of upper respiratory infection (URI) - azithromycin (ZITHROMAX) 250 MG tablet; Take 500 mg today; days 2-5 250 mg daily  Dispense: 6 tablet; Refill: 0 - albuterol (PROVENTIL HFA;VENTOLIN HFA) 108 (90 Base) MCG/ACT inhaler; Inhale 2 puffs into the lungs every 4 (four) hours as needed for wheezing or shortness of breath.  Dispense: 1 Inhaler; Refill: 1 - promethazine (PHENERGAN) 6.25 MG/5ML syrup; Take 5 mLs (6.25 mg total) by mouth every 6 (six) hours as needed for nausea or vomiting.  Dispense: 120 mL; Refill: 0 - albuterol (PROVENTIL) (2.5 MG/3ML) 0.083% nebulizer solution 2.5 mg; Take 3 mLs (2.5 mg total) by nebulization once. - ipratropium (ATROVENT) nebulizer solution 0.5 mg; Take 2.5 mLs (0.5 mg total) by nebulization once.  5. Nasal congestion  - mometasone (NASONEX) 50 MCG/ACT nasal spray; Place 2 sprays into the nose daily.  Dispense: 17 g; Refill: 1  6. Shortness of breath Shortness of breath improved some following nebulizer treatment.  Will review chest xray as results become available to rule out acute pulmonary process.  - DG Chest 2 View; Future - albuterol (PROVENTIL) (2.5 MG/3ML) 0.083% nebulizer solution 2.5 mg; Take 3 mLs (2.5 mg total) by nebulization once. - ipratropium (ATROVENT) nebulizer solution 0.5 mg; Take 2.5 mLs (0.5 mg total) by nebulization once.  Discussed diagnosis and treatment of URI. Suggested symptomatic OTC remedies. Nasal steroids per orders. Follow up as needed.      RTC: 3 months for chronic conditions   [place]  MSN, FNP-C Patient [facility] [address] [place], [place] [address] [phone]

## 2016-10-14 NOTE — Patient Instructions (Addendum)
Upper Respiratory symptoms with congested cough:  Duo nebulizer without complication Will start Azithromycin 500 mg today; and 250 mg on days 2-5.  Increase fluid intake, rest, and handwashing Albuterol 2 puffs every 4 hours as needed for wheezing, persistent cough, or shortness of breath Promethazine 6.25 ml every 6 hours as needed.     Upper Respiratory Infection, Adult Most upper respiratory infections (URIs) are a viral infection of the air passages leading to the lungs. A URI affects the nose, throat, and upper air passages. The most common type of URI is nasopharyngitis and is typically referred to as "the common cold." URIs run their course and usually go away on their own. Most of the time, a URI does not require medical attention, but sometimes a bacterial infection in the upper airways can follow a viral infection. This is called a secondary infection. Sinus and middle ear infections are common types of secondary upper respiratory infections. Bacterial pneumonia can also complicate a URI. A URI can worsen asthma and chronic obstructive pulmonary disease (COPD). Sometimes, these complications can require emergency medical care and may be life threatening. What are the causes? Almost all URIs are caused by viruses. A virus is a type of germ and can spread from one person to another. What increases the risk? You may be at risk for a URI if:  You smoke.  You have chronic heart or lung disease.  You have a weakened defense (immune) system.  You are very young or very old.  You have nasal allergies or asthma.  You work in crowded or poorly ventilated areas.  You work in health care facilities or schools.  What are the signs or symptoms? Symptoms typically develop 2-3 days after you come in contact with a cold virus. Most viral URIs last 7-10 days. However, viral URIs from the influenza virus (flu virus) can last 14-18 days and are typically more severe. Symptoms may  include:  Runny or stuffy (congested) nose.  Sneezing.  Cough.  Sore throat.  Headache.  Fatigue.  Fever.  Loss of appetite.  Pain in your forehead, behind your eyes, and over your cheekbones (sinus pain).  Muscle aches.  How is this diagnosed? Your health care provider may diagnose a URI by:  Physical exam.  Tests to check that your symptoms are not due to another condition such as: ? Strep throat. ? Sinusitis. ? Pneumonia. ? Asthma.  How is this treated? A URI goes away on its own with time. It cannot be cured with medicines, but medicines may be prescribed or recommended to relieve symptoms. Medicines may help:  Reduce your fever.  Reduce your cough.  Relieve nasal congestion.  Follow these instructions at home:  Take medicines only as directed by your health care provider.  Gargle warm saltwater or take cough drops to comfort your throat as directed by your health care provider.  Use a warm mist humidifier or inhale steam from a shower to increase air moisture. This may make it easier to breathe.  Drink enough fluid to keep your urine clear or pale yellow.  Eat soups and other clear broths and maintain good nutrition.  Rest as needed.  Return to work when your temperature has returned to normal or as your health care provider advises. You may need to stay home longer to avoid infecting others. You can also use a face mask and careful hand washing to prevent spread of the virus.  Increase the usage of your inhaler if you have  asthma.  Do not use any tobacco products, including cigarettes, chewing tobacco, or electronic cigarettes. If you need help quitting, ask your health care provider. How is this prevented? The best way to protect yourself from getting a cold is to practice good hygiene.  Avoid oral or hand contact with people with cold symptoms.  Wash your hands often if contact occurs.  There is no clear evidence that vitamin C, vitamin E,  echinacea, or exercise reduces the chance of developing a cold. However, it is always recommended to get plenty of rest, exercise, and practice good nutrition. Contact a health care provider if:  You are getting worse rather than better.  Your symptoms are not controlled by medicine.  You have chills.  You have worsening shortness of breath.  You have brown or red mucus.  You have yellow or brown nasal discharge.  You have pain in your face, especially when you bend forward.  You have a fever.  You have swollen neck glands.  You have pain while swallowing.  You have white areas in the back of your throat. Get help right away if:  You have severe or persistent: ? Headache. ? Ear pain. ? Sinus pain. ? Chest pain.  You have chronic lung disease and any of the following: ? Wheezing. ? Prolonged cough. ? Coughing up blood. ? A change in your usual mucus.  You have a stiff neck.  You have changes in your: ? Vision. ? Hearing. ? Thinking. ? Mood. This information is not intended to replace advice given to you by your health care provider. Make sure you discuss any questions you have with your health care provider. Document Released: 06/23/2000 Document Revised: 08/31/2015 Document Reviewed: 04/04/2013 Elsevier Interactive Patient Education  2017 [employer].

## 2016-10-15 ENCOUNTER — Other Ambulatory Visit: Payer: Self-pay | Admitting: Family Medicine

## 2016-10-15 ENCOUNTER — Other Ambulatory Visit: Payer: Self-pay

## 2016-10-15 DIAGNOSIS — E038 Other specified hypothyroidism: Secondary | ICD-10-CM

## 2016-10-15 MED ORDER — LEVOTHYROXINE SODIUM 137 MCG PO TABS: 137.0000 ug | ORAL_TABLET | Freq: Every day | ORAL | 0 refills | Status: DC

## 2016-10-15 NOTE — Progress Notes (Signed)
Called and spoke with patient, advised that tsh was elevated and that the levothyroxine needs to be increased to 119mcg 30 minutes every day before breakfast. Patient verbalized understanding. Thanks!

## 2016-10-15 NOTE — Progress Notes (Signed)
Reviewed labs, TSH elevated. Will increase Levothyroxine to 137 mcg daily prior to breakfast.   Meds ordered this encounter  Medications  . levothyroxine (SYNTHROID, LEVOTHROID) 137 MCG tablet    Sig: Take 1 tablet (137 mcg total) by mouth daily before breakfast.    Dispense:  42 tablet    Refill:  0     Donia Pounds  MSN, FNP-C Patient [facility] [address] [place], [place] [address] [phone]

## 2016-11-16 ENCOUNTER — Other Ambulatory Visit: Payer: Self-pay

## 2016-11-16 DIAGNOSIS — E118 Type 2 diabetes mellitus with unspecified complications: Secondary | ICD-10-CM

## 2016-11-16 DIAGNOSIS — I10 Essential (primary) hypertension: Secondary | ICD-10-CM

## 2016-11-16 MED ORDER — SAXAGLIPTIN HCL 2.5 MG PO TABS: 2.5000 mg | ORAL_TABLET | Freq: Every day | ORAL | 2 refills | Status: DC

## 2016-11-16 MED ORDER — DILTIAZEM HCL ER 180 MG PO CP24: 180.0000 mg | ORAL_CAPSULE | Freq: Every day | ORAL | 0 refills | Status: DC

## 2016-11-16 NOTE — Telephone Encounter (Signed)
Refill for diltiazem and onglyza sent into pharmacy. Thanks!

## 2016-11-24 MED FILL — DILTIAZEM 24HR ER 180 MG CA: 180 | 30 days supply | Qty: 30 | Fill #0

## 2016-11-24 MED FILL — ONGLYZA 2.5 MG TABLET: 2.5 | 20 days supply | Qty: 20 | Fill #0

## 2016-11-29 ENCOUNTER — Telehealth: Payer: Self-pay

## 2016-11-29 DIAGNOSIS — E038 Other specified hypothyroidism: Secondary | ICD-10-CM

## 2016-11-29 MED ORDER — LEVOTHYROXINE SODIUM 137 MCG PO TABS: 137.0000 ug | ORAL_TABLET | Freq: Every day | ORAL | 3 refills | Status: DC

## 2016-11-29 NOTE — Telephone Encounter (Signed)
Called patient. She is asking for a refill on thyroid medication. This has been filled and sent into pharmacy. Thanks!

## 2016-12-29 MED FILL — DILTIAZEM 24HR ER 180 MG CA: 180 | 30 days supply | Qty: 30 | Fill #0

## 2017-01-14 ENCOUNTER — Ambulatory Visit: Payer: Self-pay | Admitting: Family Medicine

## 2017-01-21 ENCOUNTER — Ambulatory Visit: Payer: Self-pay | Admitting: Family Medicine

## 2017-01-28 ENCOUNTER — Ambulatory Visit: Payer: Self-pay | Admitting: Family Medicine

## 2017-05-20 ENCOUNTER — Telehealth: Payer: Self-pay

## 2017-05-20 DIAGNOSIS — I10 Essential (primary) hypertension: Secondary | ICD-10-CM

## 2017-05-20 DIAGNOSIS — E038 Other specified hypothyroidism: Secondary | ICD-10-CM

## 2017-05-20 MED ORDER — LEVOTHYROXINE SODIUM 137 MCG PO TABS: 137.0000 ug | ORAL_TABLET | Freq: Every day | ORAL | 3 refills | Status: DC

## 2017-05-20 MED ORDER — DILTIAZEM HCL ER 180 MG PO CP24: 180.0000 mg | ORAL_CAPSULE | Freq: Every day | ORAL | 0 refills | Status: DC

## 2017-05-20 NOTE — Telephone Encounter (Signed)
This has been sent into pharmacy. Thanks!  

## 2017-05-20 NOTE — Telephone Encounter (Signed)
LAU 

## 2017-07-08 ENCOUNTER — Emergency Department (HOSPITAL_COMMUNITY): Payer: Self-pay

## 2017-07-08 ENCOUNTER — Emergency Department (HOSPITAL_COMMUNITY)
Admission: EM | Admit: 2017-07-08 | Discharge: 2017-07-08 | Disposition: A | Discharge status: Home / Self Care | Payer: Self-pay | Attending: Emergency Medicine | Admitting: Emergency Medicine

## 2017-07-08 ENCOUNTER — Encounter (HOSPITAL_COMMUNITY): Payer: Self-pay | Admitting: Emergency Medicine

## 2017-07-08 ENCOUNTER — Emergency Department (HOSPITAL_BASED_OUTPATIENT_CLINIC_OR_DEPARTMENT_OTHER): Payer: Self-pay

## 2017-07-08 DIAGNOSIS — M79602 Pain in left arm: Secondary | ICD-10-CM | POA: Insufficient documentation

## 2017-07-08 DIAGNOSIS — E079 Disorder of thyroid, unspecified: Secondary | ICD-10-CM | POA: Insufficient documentation

## 2017-07-08 DIAGNOSIS — Z79899 Other long term (current) drug therapy: Secondary | ICD-10-CM | POA: Insufficient documentation

## 2017-07-08 DIAGNOSIS — F17210 Nicotine dependence, cigarettes, uncomplicated: Secondary | ICD-10-CM | POA: Insufficient documentation

## 2017-07-08 DIAGNOSIS — E119 Type 2 diabetes mellitus without complications: Secondary | ICD-10-CM | POA: Insufficient documentation

## 2017-07-08 DIAGNOSIS — M79609 Pain in unspecified limb: Secondary | ICD-10-CM

## 2017-07-08 DIAGNOSIS — M7989 Other specified soft tissue disorders: Secondary | ICD-10-CM

## 2017-07-08 DIAGNOSIS — I10 Essential (primary) hypertension: Secondary | ICD-10-CM | POA: Insufficient documentation

## 2017-07-08 HISTORY — DX: Type 2 diabetes mellitus without complications: E11.9

## 2017-07-08 LAB — I-STAT TROPONIN, ED: Troponin i, poc: 0 ng/mL (ref 0.00–0.08)

## 2017-07-08 LAB — CBC
HCT: 42.9 % (ref 36.0–46.0)
Hemoglobin: 13.6 g/dL (ref 12.0–15.0)
MCH: 30 pg (ref 26.0–34.0)
MCHC: 31.7 g/dL (ref 30.0–36.0)
MCV: 94.5 fL (ref 78.0–100.0)
Platelets: 315 10*3/uL (ref 150–400)
RBC: 4.54 MIL/uL (ref 3.87–5.11)
RDW: 13.2 % (ref 11.5–15.5)
WBC: 8.2 10*3/uL (ref 4.0–10.5)

## 2017-07-08 LAB — URINALYSIS, ROUTINE W REFLEX MICROSCOPIC
Bacteria, UA: NONE SEEN
Bilirubin Urine: NEGATIVE
Ketones, ur: NEGATIVE mg/dL
Leukocytes, UA: NEGATIVE
Nitrite: NEGATIVE
PH: 7 (ref 5.0–8.0)
Protein, ur: NEGATIVE mg/dL
SPECIFIC GRAVITY, URINE: 1.001 — AB (ref 1.005–1.030)

## 2017-07-08 LAB — BASIC METABOLIC PANEL
ANION GAP: 9 (ref 5–15)
BUN: 10 mg/dL (ref 6–20)
CALCIUM: 8.5 mg/dL — AB (ref 8.9–10.3)
CO2: 25 mmol/L (ref 22–32)
Chloride: 105 mmol/L (ref 98–111)
Creatinine, Ser: 0.94 mg/dL (ref 0.44–1.00)
GFR calc Af Amer: >60 mL/min (ref >60)
GLUCOSE: 269 mg/dL — AB (ref 70–99)
Potassium: 3.6 mmol/L (ref 3.5–5.1)
SODIUM: 139 mmol/L (ref 135–145)

## 2017-07-08 LAB — CBG MONITORING, ED: Glucose-Capillary: 248 mg/dL — ABNORMAL HIGH (ref 70–99)

## 2017-07-08 MED ORDER — IBUPROFEN 400 MG PO TABS
600.0000 mg | ORAL_TABLET | Freq: Once | ORAL | Status: AC
  Administered 2017-07-08: 600 mg via ORAL
  Filled 2017-07-08: qty 1

## 2017-07-08 NOTE — Progress Notes (Signed)
Left upper extremity venous duplex has been completed. Negative for DVT. Results were given to Dr. Venora Maples.  07/08/17 5:06 PM Cynthia Patton RVT

## 2017-07-08 NOTE — ED Notes (Signed)
Patient ambulated to bathroom independently at this time. No c/o dizziness or shob.

## 2017-07-08 NOTE — ED Notes (Signed)
Patient Alert and oriented to baseline. Stable and ambulatory to baseline. Patient verbalized understanding of the discharge instructions.  Patient belongings were taken by the patient.   

## 2017-07-08 NOTE — ED Notes (Signed)
CBG 248 on arrival to ED

## 2017-07-08 NOTE — ED Notes (Signed)
Pt ambulated to room from restroom, tolerated well. Pt c/o of dizziness and SOB

## 2017-07-08 NOTE — ED Triage Notes (Signed)
Patient complains of left arm pain that started after woke up this morning. Denies chest pain, denies shortness of breath, denies nausea. History of diabetes, states she has been unable to afford her medications for the last month, CBG 320 with EMS. Patient alert, oriented, and in no apparent distress at this time.

## 2017-07-08 NOTE — Discharge Instructions (Addendum)
Elevate your arm.  Take ibuprofen as needed for the pain  Call your primary care physician for follow up

## 2017-07-11 ENCOUNTER — Encounter: Payer: Self-pay | Admitting: Family Medicine

## 2017-07-11 ENCOUNTER — Ambulatory Visit (INDEPENDENT_AMBULATORY_CARE_PROVIDER_SITE_OTHER): Payer: Self-pay | Admitting: Family Medicine

## 2017-07-11 VITALS — BP 158/64 | HR 86 | Temp 98.4°F | Resp 16 | Ht 63.0 in | Wt 225.0 lb

## 2017-07-11 DIAGNOSIS — F17200 Nicotine dependence, unspecified, uncomplicated: Secondary | ICD-10-CM

## 2017-07-11 DIAGNOSIS — F329 Major depressive disorder, single episode, unspecified: Secondary | ICD-10-CM

## 2017-07-11 DIAGNOSIS — F32A Depression, unspecified: Secondary | ICD-10-CM

## 2017-07-11 DIAGNOSIS — E785 Hyperlipidemia, unspecified: Secondary | ICD-10-CM

## 2017-07-11 DIAGNOSIS — I10 Essential (primary) hypertension: Secondary | ICD-10-CM

## 2017-07-11 DIAGNOSIS — E118 Type 2 diabetes mellitus with unspecified complications: Secondary | ICD-10-CM

## 2017-07-11 DIAGNOSIS — E039 Hypothyroidism, unspecified: Secondary | ICD-10-CM

## 2017-07-11 LAB — POCT URINALYSIS DIPSTICK
Glucose, UA: NEGATIVE
KETONES UA: NEGATIVE
Leukocytes, UA: NEGATIVE
NITRITE UA: NEGATIVE
PROTEIN UA: POSITIVE — AB
Spec Grav, UA: 1.025 (ref 1.010–1.025)
UROBILINOGEN UA: 0.2 U/dL
pH, UA: 5.5 (ref 5.0–8.0)

## 2017-07-11 LAB — POCT GLYCOSYLATED HEMOGLOBIN (HGB A1C): Hemoglobin A1C: 8 % — AB (ref 4.0–5.6)

## 2017-07-11 MED ORDER — BUPROPION HCL ER (XL) 150 MG PO TB24: 150.0000 mg | ORAL_TABLET | Freq: Every day | ORAL | 2 refills | Status: DC

## 2017-07-11 MED ORDER — BUPROPION HCL ER (XL) 150 MG PO TB24: 150.0000 mg | ORAL_TABLET | Freq: Every day | ORAL | 5 refills | Status: DC

## 2017-07-11 MED ORDER — DILTIAZEM HCL ER 240 MG PO CP24: 240.0000 mg | ORAL_CAPSULE | Freq: Every day | ORAL | 5 refills | Status: DC

## 2017-07-11 MED ORDER — SAXAGLIPTIN HCL 2.5 MG PO TABS: 2.5000 mg | ORAL_TABLET | Freq: Every day | ORAL | 2 refills | Status: DC

## 2017-07-11 MED FILL — !ONGLYZA 2.5 MG TAB: 2.5 | 30 days supply | Qty: 30 | Fill #0

## 2017-07-11 MED FILL — DILTIAZEM 24HR ER 180 MG CA: 180 | 30 days supply | Qty: 30 | Fill #0

## 2017-07-11 NOTE — Progress Notes (Signed)
Subjective:    Patient ID: Cynthia Patton, female    DOB: Jun 01, 1960, 57 y.o.   MRN: [id number]  Cynthia Patton, a 57 year old female with a history of type 2 diabetes mellitus, uncontrolled hypertension, and hypothyroidism presents for evaluation of chronic conditions. Patient has been lost to follow up over the past several months due to insurance constraints. Patient was evaluated in the emergency department on 07/08/2017 for elevated blood pressure. Patient's blood pressure was markedly elevated despite taking Diltiazem 180 mg.  She has been without antidiabetic medication over the past month. Cynthia Patton has been taking all other medications consistently.  Cynthia Patton does not exercise routinely or follow a lowfat, low carbohydrate diet. Body mass index is 39.86 kg/m. She endorses periodic paresthesias to hands and feet. Patient denies headache, chest pain, heart palpitations, fatigue, dyspnea, polyuria, polydipsia, polyphagia, nausea, vomiting, diarrhea, or constipation.  Past Medical History:  Diagnosis Date  . Diabetes mellitus without complication ([facility])   . Hypertension   . Thyroid disease    Social History   Socioeconomic History  . Marital status: Single    Spouse name: Not on file  . Number of children: Not on file  . Years of education: Not on file  . Highest education level: Not on file  Occupational History  . Not on file  Social Needs  . Financial resource strain: Not on file  . Food insecurity:    Worry: Not on file    Inability: Not on file  . Transportation needs:    Medical: Not on file    Non-medical: Not on file  Tobacco Use  . Smoking status: Current Every Day Smoker    Packs/day: 0.50    Types: Cigarettes  . Smokeless tobacco: Never Used  Substance and Sexual Activity  . Alcohol use: No  . Drug use: No  . Sexual activity: Not on file  Lifestyle  . Physical activity:    Days per week: Not on file    Minutes per session: Not on file  . Stress:  Not on file  Relationships  . Social connections:    Talks on phone: Not on file    Gets together: Not on file    Attends religious service: Not on file    Active member of club or organization: Not on file    Attends meetings of clubs or organizations: Not on file    Relationship status: Not on file  . Intimate partner violence:    Fear of current or ex partner: Not on file    Emotionally abused: Not on file    Physically abused: Not on file    Forced sexual activity: Not on file  Other Topics Concern  . Not on file  Social History Narrative  . Not on file   Immunization History  Administered Date(s) Administered  . Pneumococcal Polysaccharide-23 03/19/2016  . Tdap 02/09/2016    Review of Systems  Constitutional: Negative.        Weight gain  HENT: Negative.   Eyes: Negative.   Respiratory: Negative.  Negative for cough and shortness of breath.   Cardiovascular: Negative.   Gastrointestinal: Negative.   Endocrine: Negative for polydipsia, polyphagia and polyuria.  Genitourinary: Negative.   Musculoskeletal: Negative.   Skin: Negative.   Allergic/Immunologic: Negative for immunocompromised state.  Neurological: Negative.  Negative for dizziness, tremors and weakness.  Hematological: Negative.   Psychiatric/Behavioral: Negative.        Objective:   Physical  Exam  Constitutional: She is oriented to person, place, and time. She appears well-developed.  HENT:  Head: Normocephalic and atraumatic.  Neck: Normal range of motion. Neck supple.  Cardiovascular: Normal rate, regular rhythm, normal heart sounds and intact distal pulses.  Pulmonary/Chest: Effort normal and breath sounds normal.  Abdominal: Soft. Bowel sounds are normal.  Neurological: She is alert and oriented to person, place, and time.  Skin: Skin is warm and dry.  Psychiatric: Her behavior is normal. Judgment and thought content normal. She exhibits a depressed mood (situational ).  Tearful      BP  (!) 158/64 (BP Location: Right Arm, Patient Position: Sitting, Cuff Size: Large) Comment: manually  Pulse 86   Temp 98.4 F (36.9 C) (Oral)   Resp 16   Ht 5\' 3"  (1.6 m)   Wt 225 lb (102.1 kg)   SpO2 100%   BMI 39.86 kg/m  Assessment & Plan:  1. Essential hypertension Blood pressure is above goal on current medication regimen. Will increase Diltiazem to 240 mg daily.  Patient to return for blood pressure check in 1 week.  Will review renal functioning as results become available.  Blood pressure goal is <140/90 - Urinalysis Dipstick - Microalbumin/Creatinine Ratio, Urine - diltiazem (DILACOR XR) 240 MG 24 hr capsule; Take 1 capsule (240 mg total) by mouth daily.  Dispense: 30 capsule; Refill: 5  2. Controlled type 2 diabetes mellitus with complication, without long-term current use of insulin (HCC) Hemoglobin a1C has increased to 8.0 on current medication regimen. Will restart onglyza 2.5 mg and continue Metformin 500 mg twice daily.  Hemoglobin a1C goal is <7.0.  Recommend a lowfat, low carbohydrate diet divided over 5-6 small meals, increase water intake to 6-8 glasses, and 150 minutes per week of cardiovascular exercise.   - HgB A1c - Microalbumin/Creatinine Ratio, Urine - saxagliptin HCl (ONGLYZA) 2.5 MG TABS tablet; Take 1 tablet (2.5 mg total) by mouth daily.  Dispense: 30 tablet; Refill: 2  3. Hyperlipidemia, unspecified hyperlipidemia type The 10-year ASCVD risk score Mikey Bussing DC Jr., et al., 2013) is: 55.7%   Values used to calculate the score:     Age: 45 years     Sex: Female     Is Non-Hispanic African American: Yes     Diabetic: Yes     Tobacco smoker: Yes     Systolic Blood Pressure: 528 mmHg     Is BP treated: Yes     HDL Cholesterol: 32 mg/dL     Total Cholesterol: 187 mg/dL  4. Acquired hypothyroidism  - Thyroid Panel With TSH  5. Depression, unspecified depression type - buPROPion (WELLBUTRIN XL) 150 MG 24 hr tablet; Take 1 tablet (150 mg total) by mouth  daily.  Dispense: 30 tablet; Refill: 5  6. Tobacco dependence Smoking cessation instruction/counseling given:  counseled patient on the dangers of tobacco use, advised patient to stop smoking, and reviewed strategies to maximize success    RTC: 3 months for chronic conditions.Return in 1 week for blood pressure check.    Donia Pounds  MSN, FNP-C Patient [facility] [address] [place], [place] [address] [phone]

## 2017-07-11 NOTE — Patient Instructions (Addendum)
[facility]) Will follow up by phone with any      Hypothyroidism Hypothyroidism is a disorder of the thyroid. The thyroid is a large gland that is located in the lower front of the neck. The thyroid releases hormones that control how the body works. With hypothyroidism, the thyroid does not make enough of these hormones. What are the causes? Causes of hypothyroidism may include:  Viral infections.  Pregnancy.  Your own defense system (immune system) attacking your thyroid.  Certain medicines.  Birth defects.  Past radiation treatments to your head or neck.  Past treatment with radioactive iodine.  Past surgical removal of part or all of your thyroid.  Problems with the gland that is located in the center of your brain (pituitary).  What are the signs or symptoms? Signs and symptoms of hypothyroidism may include:  Feeling as though you have no energy (lethargy).  Inability to tolerate cold.  Weight gain that is not explained by a change in diet or exercise habits.  Dry skin.  Coarse hair.  Menstrual irregularity.  Slowing of thought processes.  Constipation.  Sadness or depression.  How is this diagnosed? Your health care provider may diagnose hypothyroidism with blood tests and ultrasound tests. How is this treated? Hypothyroidism is treated with medicine that replaces the hormones that your body does not make. After you begin treatment, it may take several weeks for symptoms to go away. Follow these instructions at home:  Take medicines only as directed by your health care provider.  If you start taking any new medicines, tell your health care provider.  Keep all follow-up visits as directed by your health care provider. This is important. As your condition improves, your dosage needs may change. You will need to have blood tests regularly so that your health care provider can watch your condition. Contact a health care provider  if:  Your symptoms do not get better with treatment.  You are taking thyroid replacement medicine and: ? You sweat excessively. ? You have tremors. ? You feel anxious. ? You lose weight rapidly. ? You cannot tolerate heat. ? You have emotional swings. ? You have diarrhea. ? You feel weak. Get help right away if:  You develop chest pain.  You develop an irregular heartbeat.  You develop a rapid heartbeat. This information is not intended to replace advice given to you by your health care provider. Make sure you discuss any questions you have with your health care provider. Document Released: 12/28/2004 Document Revised: 06/05/2015 Document Reviewed: 05/15/2013 Elsevier Interactive Patient Education  2018 [employer].

## 2017-07-12 LAB — MICROALBUMIN / CREATININE URINE RATIO
CREATININE, UR: 356.7 mg/dL
Microalb/Creat Ratio: 27.1 mg/g creat (ref 0.0–30.0)
Microalbumin, Urine: 96.8 ug/mL

## 2017-07-12 LAB — THYROID PANEL WITH TSH
FREE THYROXINE INDEX: 3.1 (ref 1.2–4.9)
T3 Uptake Ratio: 30 % (ref 24–39)
T4, Total: 10.2 ug/dL (ref 4.5–12.0)
TSH: 8.04 u[IU]/mL — AB (ref 0.450–4.500)

## 2017-07-12 MED FILL — BUPROPION HCL XL 150 MG TAB: 150 | 30 days supply | Qty: 30 | Fill #0

## 2017-07-13 ENCOUNTER — Other Ambulatory Visit: Payer: Self-pay | Admitting: Family Medicine

## 2017-07-13 ENCOUNTER — Telehealth: Payer: Self-pay

## 2017-07-13 DIAGNOSIS — E038 Other specified hypothyroidism: Secondary | ICD-10-CM

## 2017-07-13 MED ORDER — LEVOTHYROXINE SODIUM 150 MCG PO TABS: 137.0000 ug | ORAL_TABLET | Freq: Every day | ORAL | 5 refills | Status: DC

## 2017-07-13 NOTE — Telephone Encounter (Signed)
Called and spoke with patient, advised that TSH is increased on current dosage of levothyroxine and that we will increase to 150 mcg and will repeat in 4 weeks. Advised that we will increase by 80mcg every 4 weeks until in range. Lab appointment was scheduled. Thanks!

## 2017-07-13 NOTE — Telephone Encounter (Signed)
-----   Message from Dorena Dew, [place] sent at 07/13/2017  4:47 AM EDT ----- Regarding: lab results Please inform patient that TSH is increase on current dosage of levothyroxine. Will increase to 150 mcg and repeat in 4 weeks. Will increase dose by 25 mcg every 4 weeks until patient is euthyroid. Schedule lab appointment. Thanks   Donia Pounds  MSN, FNP-C Patient [facility] [address] [place], [place] [address] [phone]

## 2017-07-13 NOTE — Progress Notes (Signed)
Meds ordered this encounter  Medications  . levothyroxine (SYNTHROID, LEVOTHROID) 150 MCG tablet    Sig: Take 1 tablet (150 mcg total) by mouth daily before breakfast.    Dispense:  30 tablet    Refill:  5   Orders Placed This Encounter  Procedures  . Thyroid Panel With TSH    Standing Status:   Future    Standing Expiration Date:   07/14/2018    Donia Pounds  MSN, FNP-C Patient [facility] [address] [place], [place] [address] [phone]

## 2017-07-18 ENCOUNTER — Ambulatory Visit: Payer: Self-pay

## 2017-07-18 VITALS — BP 142/62

## 2017-07-18 DIAGNOSIS — I10 Essential (primary) hypertension: Secondary | ICD-10-CM

## 2017-07-22 NOTE — ED Provider Notes (Signed)
[facility] EMERGENCY DEPARTMENT Provider Note   CSN: [id number] Arrival date & time: 07/08/17  1419     History   Chief Complaint Chief Complaint  Patient presents with  . Arm Pain    HPI Cynthia Patton is a 57 y.o. female.  HPI 57 year old female with history of diabetes who presents emergency department with left arm pain that is been present since she awoke this morning.  No chest pain or shortness of breath.  Denies neck pain.  Reports still having ongoing discomfort in her left arm.  No numbness or weakness to the left arm.  No pain with range of motion of her major joints of her left upper extremity.   Past Medical History:  Diagnosis Date  . Diabetes mellitus without complication ([place])   . Hypertension   . Thyroid disease     Patient Active Problem List   Diagnosis Date Noted  . Positive ANA (antinuclear antibody) 04/06/2016  . Hyperlipidemia 03/19/2016  . Controlled type 2 diabetes mellitus with complication, without long-term current use of insulin ([place]) 02/09/2016  . Essential hypertension 02/09/2016  . Screening for HIV (human immunodeficiency virus) 02/09/2016  . Need for Tdap vaccination 02/09/2016  . Health care maintenance 02/24/2015    Past Surgical History:  Procedure Laterality Date  . ABDOMINAL HYSTERECTOMY    . CHOLECYSTECTOMY       OB History   None      Home Medications    Prior to Admission medications   Medication Sig Start Date End Date Taking? Authorizing Provider  acetaminophen (TYLENOL) 500 MG tablet Take 1 tablet (500 mg total) by mouth every 6 (six) hours as needed. 07/01/16   Dorena Dew, FNP  albuterol (PROVENTIL HFA;VENTOLIN HFA) 108 (90 Base) MCG/ACT inhaler Inhale 2 puffs into the lungs every 4 (four) hours as needed for wheezing or shortness of breath. 10/14/16   Dorena Dew, FNP  aspirin EC 81 MG tablet Take 1 tablet (81 mg total) by mouth daily. 03/19/16   Dorena Dew, FNP  buPROPion  (WELLBUTRIN XL) 150 MG 24 hr tablet Take 1 tablet (150 mg total) by mouth daily. 07/11/17   Dorena Dew, FNP  diltiazem (DILACOR XR) 240 MG 24 hr capsule Take 1 capsule (240 mg total) by mouth daily. 07/11/17   Dorena Dew, FNP  levothyroxine (SYNTHROID, LEVOTHROID) 150 MCG tablet Take 1 tablet (150 mcg total) by mouth daily before breakfast. 07/13/17   Dorena Dew, FNP  mometasone (NASONEX) 50 MCG/ACT nasal spray Place 2 sprays into the nose daily. 10/14/16   Dorena Dew, FNP  omega-3 acid ethyl esters (LOVAZA) 1 g capsule Take 2 capsules (2 g total) by mouth 2 (two) times daily. Patient not taking: Reported on 08/30/2016 05/20/16   Dorena Dew, FNP  promethazine (PHENERGAN) 6.25 MG/5ML syrup Take 5 mLs (6.25 mg total) by mouth every 6 (six) hours as needed for nausea or vomiting. 10/14/16   Dorena Dew, FNP  saxagliptin HCl (ONGLYZA) 2.5 MG TABS tablet Take 1 tablet (2.5 mg total) by mouth daily. 07/11/17   Dorena Dew, FNP  traMADol (ULTRAM) 50 MG tablet Take 1 tablet (50 mg total) by mouth every 8 (eight) hours as needed. 07/01/16   Dorena Dew, FNP    Family History No family history on file.  Social History Social History   Tobacco Use  . Smoking status: Current Every Day Smoker    Packs/day: 0.50  Types: Cigarettes  . Smokeless tobacco: Never Used  Substance Use Topics  . Alcohol use: No  . Drug use: No     Allergies   Floxin [ocuflox] and Percocet [oxycodone-acetaminophen]   Review of Systems Review of Systems  All other systems reviewed and are negative.    Physical Exam Updated Vital Signs BP (!) 167/65   Pulse 69   Resp 19   SpO2 98%   Physical Exam  Constitutional: She is oriented to person, place, and time. She appears well-developed and well-nourished. No distress.  HENT:  Head: Normocephalic and atraumatic.  Eyes: EOM are normal.  Neck: Normal range of motion.  Cardiovascular: Normal rate, regular rhythm and normal  heart sounds.  Pulmonary/Chest: Effort normal and breath sounds normal.  Abdominal: Soft. She exhibits no distension. There is no tenderness.  Musculoskeletal: Normal range of motion.  Full range of motion of the left shoulder, left elbow, left wrist.  Normal left radial pulse.  Normal grip strength left hand.  No swelling of the left upper extremity as compared to the right.  Neurological: She is alert and oriented to person, place, and time.  Skin: Skin is warm and dry.  Psychiatric: She has a normal mood and affect. Judgment normal.  Nursing note and vitals reviewed.    ED Treatments / Results  Labs (all labs ordered are listed, but only abnormal results are displayed) Labs Reviewed  BASIC METABOLIC PANEL - Abnormal; Notable for the following components:      Result Value   Glucose, Bld 269 (*)    Calcium 8.5 (*)    All other components within normal limits  URINALYSIS, ROUTINE W REFLEX MICROSCOPIC - Abnormal; Notable for the following components:   Color, Urine STRAW (*)    Specific Gravity, Urine 1.001 (*)    Glucose, UA >=500 (*)    Hgb urine dipstick MODERATE (*)    All other components within normal limits  CBG MONITORING, ED - Abnormal; Notable for the following components:   Glucose-Capillary 248 (*)    All other components within normal limits  CBC  I-STAT TROPONIN, ED    EKG EKG Interpretation  Date/Time:  Friday July 08 2017 14:38:45 EDT Ventricular Rate:  86 PR Interval:    QRS Duration: 89 QT Interval:  363 QTC Calculation: 435 R Axis:   62 Text Interpretation:  Sinus rhythm Left atrial enlargement Borderline ST elevation, anterior leads No significant change was found Confirmed by Jola Schmidt [phone]) on 07/08/2017 4:21:23 PM   Radiology No results found.  Procedures Procedures (including critical care time)  Medications Ordered in ED Medications  ibuprofen (ADVIL,MOTRIN) tablet 600 mg (600 mg Oral Given 07/08/17 1636)     Initial Impression /  Assessment and Plan / ED Course  I have reviewed the triage vital signs and the nursing notes.  Pertinent labs & imaging results that were available during my care of the patient were reviewed by me and considered in my medical decision making (see chart for details).     Overall well-appearing.  Discharged home in good condition.  Low suspicion for ACS.  Doubt PE.  Doubt dissection.  Normal left radial pulse.  Doubt DVT left upper extremity  Final Clinical Impressions(s) / ED Diagnoses   Final diagnoses:  Left arm pain    ED Discharge Orders    None       Jola Schmidt, MD 07/22/17 0009

## 2017-08-09 MED FILL — DILTIAZEM 24HR ER 180 MG CA: 180 | 30 days supply | Qty: 30 | Fill #1

## 2017-08-09 MED FILL — BUPROPION HCL XL 150 MG TAB: 150 | 30 days supply | Qty: 30 | Fill #1

## 2017-08-10 ENCOUNTER — Ambulatory Visit (INDEPENDENT_AMBULATORY_CARE_PROVIDER_SITE_OTHER): Payer: Self-pay

## 2017-08-10 VITALS — BP 130/60

## 2017-08-10 DIAGNOSIS — E038 Other specified hypothyroidism: Secondary | ICD-10-CM

## 2017-08-10 DIAGNOSIS — E118 Type 2 diabetes mellitus with unspecified complications: Secondary | ICD-10-CM

## 2017-08-10 LAB — POCT GLYCOSYLATED HEMOGLOBIN (HGB A1C): Hemoglobin A1C: 7.4 % — AB (ref 4.0–5.6)

## 2017-08-11 LAB — THYROID PANEL WITH TSH
Free Thyroxine Index: 3.8 (ref 1.2–4.9)
T3 Uptake Ratio: 31 % (ref 24–39)
T4 TOTAL: 12.1 ug/dL — AB (ref 4.5–12.0)
TSH: 3 u[IU]/mL (ref 0.450–4.500)

## 2017-08-15 ENCOUNTER — Telehealth: Payer: Self-pay

## 2017-08-15 NOTE — Telephone Encounter (Signed)
-----   Message from Lanae Boast, [place] sent at 08/14/2017 11:02 PM EDT ----- Inform patient that her thyroid levels are stable. Continue with current medications. A1c decreased to 7.4. Almost to goal.

## 2017-08-15 NOTE — Telephone Encounter (Signed)
Called and spoke with patient, advised that thyroid levels are stable and that a1c has decreased to 7.4 which is almost at goal. Asked to continue all medications and keep next appointment. Thanks!

## 2017-08-25 ENCOUNTER — Ambulatory Visit (INDEPENDENT_AMBULATORY_CARE_PROVIDER_SITE_OTHER): Payer: Self-pay | Admitting: Family Medicine

## 2017-08-25 ENCOUNTER — Encounter: Payer: Self-pay | Admitting: Family Medicine

## 2017-08-25 VITALS — BP 147/59 | HR 88 | Temp 98.6°F | Resp 16 | Ht 63.0 in | Wt 219.0 lb

## 2017-08-25 DIAGNOSIS — J069 Acute upper respiratory infection, unspecified: Secondary | ICD-10-CM

## 2017-08-25 MED ORDER — PENICILLIN V POTASSIUM 500 MG PO TABS: 500.0000 mg | ORAL_TABLET | Freq: Two times a day (BID) | ORAL | 0 refills | Status: AC

## 2017-08-25 MED ORDER — GUAIFENESIN ER 600 MG PO TB12: 600.0000 mg | ORAL_TABLET | Freq: Two times a day (BID) | ORAL | 0 refills | Status: AC

## 2017-08-25 MED FILL — PENICILLIN VK 500 MG TABLET: 500 | 10 days supply | Qty: 20 | Fill #0

## 2017-08-25 NOTE — Patient Instructions (Signed)
Upper Respiratory Infection, Adult Most upper respiratory infections (URIs) are caused by a virus. A URI affects the nose, throat, and upper air passages. The most common type of URI is often called "the common cold." Follow these instructions at home:  Take medicines only as told by your doctor.  Gargle warm saltwater or take cough drops to comfort your throat as told by your doctor.  Use a warm mist humidifier or inhale steam from a shower to increase air moisture. This may make it easier to breathe.  Drink enough fluid to keep your pee (urine) clear or pale yellow.  Eat soups and other clear broths.  Have a healthy diet.  Rest as needed.  Go back to work when your fever is gone or your doctor says it is okay. ? You may need to stay home longer to avoid giving your URI to others. ? You can also wear a face mask and wash your hands often to prevent spread of the virus.  Use your inhaler more if you have asthma.  Do not use any tobacco products, including cigarettes, chewing tobacco, or electronic cigarettes. If you need help quitting, ask your doctor. Contact a doctor if:  You are getting worse, not better.  Your symptoms are not helped by medicine.  You have chills.  You are getting more short of breath.  You have brown or red mucus.  You have yellow or brown discharge from your nose.  You have pain in your face, especially when you bend forward.  You have a fever.  You have puffy (swollen) neck glands.  You have pain while swallowing.  You have white areas in the back of your throat. Get help right away if:  You have very bad or constant: ? Headache. ? Ear pain. ? Pain in your forehead, behind your eyes, and over your cheekbones (sinus pain). ? Chest pain.  You have long-lasting (chronic) lung disease and any of the following: ? Wheezing. ? Long-lasting cough. ? Coughing up blood. ? A change in your usual mucus.  You have a stiff neck.  You have  changes in your: ? Vision. ? Hearing. ? Thinking. ? Mood. This information is not intended to replace advice given to you by your health care provider. Make sure you discuss any questions you have with your health care provider. Document Released: 06/16/2007 Document Revised: 08/31/2015 Document Reviewed: 04/04/2013 Elsevier Interactive Patient Education  2018 Elsevier Inc.  

## 2017-08-25 NOTE — Progress Notes (Signed)
Patient [facility]   Progress Note: General Provider: Lanae Boast, FNP  SUBJECTIVE:   Chief Complaint  Patient presents with  . Cough  . Nasal Congestion    Patient presents with cough and congestion x 4 days. Patient states that she was around a sick child. Patient with nasal congestion, sinus pain and pressure. Patient with productive cough with thick yellow mucus. Patient is a smoker.  .  Past Medical History:  Diagnosis Date  . Diabetes mellitus without complication ([place])   . Hypertension   . Thyroid disease     Social History   Socioeconomic History  . Marital status: Single    Spouse name: Not on file  . Number of children: Not on file  . Years of education: Not on file  . Highest education level: Not on file  Occupational History  . Not on file  Social Needs  . Financial resource strain: Not on file  . Food insecurity:    Worry: Not on file    Inability: Not on file  . Transportation needs:    Medical: Not on file    Non-medical: Not on file  Tobacco Use  . Smoking status: Current Every Day Smoker    Packs/day: 0.50    Types: Cigarettes  . Smokeless tobacco: Never Used  Substance and Sexual Activity  . Alcohol use: No  . Drug use: No  . Sexual activity: Not on file  Lifestyle  . Physical activity:    Days per week: Not on file    Minutes per session: Not on file  . Stress: Not on file  Relationships  . Social connections:    Talks on phone: Not on file    Gets together: Not on file    Attends religious service: Not on file    Active member of club or organization: Not on file    Attends meetings of clubs or organizations: Not on file    Relationship status: Not on file  . Intimate partner violence:    Fear of current or ex partner: Not on file    Emotionally abused: Not on file    Physically abused: Not on file    Forced sexual activity: Not on file  Other Topics Concern  . Not on file  Social  History Narrative  . Not on file      Review of Systems  Constitutional: Positive for chills and malaise/fatigue. Negative for fever.  HENT: Positive for congestion and sinus pain. Negative for sore throat.   Eyes: Negative.   Respiratory: Positive for cough, sputum production (dark yellow) and shortness of breath.   Cardiovascular: Negative.   Gastrointestinal: Negative.   Neurological: Negative.   Psychiatric/Behavioral: Negative.     OBJECTIVE: BP (!) 147/59 (BP Location: Left Arm, Patient Position: Sitting, Cuff Size: Large)   Pulse 88   Temp 98.6 F (37 C) (Oral)   Resp 16   Ht 5\' 3"  (1.6 m)   Wt 219 lb (99.3 kg)   SpO2 99%   BMI 38.79 kg/m   Physical Exam  Constitutional: She is oriented to person, place, and time. She appears well-developed and well-nourished. No distress.  HENT:  Head: Normocephalic.  Right Ear: Hearing, tympanic membrane, external ear and ear canal normal.  Left Ear: Hearing, tympanic membrane, external ear and ear canal normal.  Nose: Rhinorrhea present. No mucosal edema. Right sinus exhibits no maxillary sinus tenderness and no frontal sinus tenderness. Left sinus exhibits  no maxillary sinus tenderness and no frontal sinus tenderness.  Mouth/Throat: Uvula is midline and oropharynx is clear and moist.  Cardiovascular: Normal rate, regular rhythm, normal heart sounds and intact distal pulses.  No murmur heard. Pulmonary/Chest: Effort normal. No respiratory distress. She has no rales. She exhibits no tenderness.  Coughing throughout the encounter.    Neurological: She is alert and oriented to person, place, and time.  Skin: Skin is warm and dry.  Psychiatric: She has a normal mood and affect. Her behavior is normal. Judgment and thought content normal.  Nursing note and vitals reviewed.   ASSESSMENT/PLAN:   1. Upper respiratory tract infection, unspecified type Patient requesting antibiotic. Explained that there is no need for antibiotic at  the present time. Encouraged the use of sinus lavage and to take the antibiotic only if symptoms remain for 7 days.  - penicillin v potassium (VEETID) 500 MG tablet; Take 1 tablet (500 mg total) by mouth 2 (two) times daily for 10 days.  Dispense: 20 tablet; Refill: 0 - guaiFENesin (MUCINEX) 600 MG 12 hr tablet; Take 1 tablet (600 mg total) by mouth 2 (two) times daily for 5 days.  Dispense: 10 tablet; Refill: 0        The patient was given clear instructions to go to ER or return to medical center if symptoms do not improve, worsen or new problems develop. The patient verbalized understanding and agreed with plan of care.   [name], FNP-BC Patient [facility] [address] [place], [place] [address] [phone]     This note has been created with Dragon speech recognition software and smart phrase technology. Any transcriptional errors are unintentional.

## 2017-08-26 ENCOUNTER — Telehealth: Payer: Self-pay

## 2017-08-26 NOTE — Telephone Encounter (Signed)
Patient states her face is puffy still. And she has dark circles under her eyes and that her vision goes blurry some since this sinus stuff started. What do you recommend she do? She was seen yesterday 08/25/2017 and got the antibiotic but was unable to get the mucinex due to it being otc and she is unable to pay for it. Please advise. Thanks!

## 2017-08-31 NOTE — Telephone Encounter (Signed)
Called and spoke with patient. Advised that she can use some saline nasal rinse. Patient verbalized understanding. Thanks!

## 2017-08-31 NOTE — Telephone Encounter (Signed)
She can use sinus rinsing with nasal saline.

## 2017-09-02 ENCOUNTER — Ambulatory Visit (HOSPITAL_COMMUNITY)
Admission: RE | Admit: 2017-09-02 | Discharge: 2017-09-02 | Disposition: A | Discharge status: Home / Self Care | Payer: Self-pay | Source: Ambulatory Visit | Attending: Family Medicine | Admitting: Family Medicine

## 2017-09-02 ENCOUNTER — Encounter: Payer: Self-pay | Admitting: Family Medicine

## 2017-09-02 ENCOUNTER — Ambulatory Visit (INDEPENDENT_AMBULATORY_CARE_PROVIDER_SITE_OTHER): Payer: Self-pay | Admitting: Family Medicine

## 2017-09-02 VITALS — BP 146/56 | HR 86 | Temp 98.9°F | Resp 14 | Ht 63.0 in | Wt 218.0 lb

## 2017-09-02 DIAGNOSIS — R05 Cough: Secondary | ICD-10-CM

## 2017-09-02 DIAGNOSIS — R059 Cough, unspecified: Secondary | ICD-10-CM

## 2017-09-02 MED ORDER — PREDNISONE 20 MG PO TABS: 20.0000 mg | ORAL_TABLET | Freq: Every day | ORAL | 0 refills | Status: AC

## 2017-09-02 MED ORDER — GUAIFENESIN-CODEINE 100-10 MG/5ML PO SYRP: 5.0000 mL | ORAL_SOLUTION | Freq: Three times a day (TID) | ORAL | 0 refills | Status: DC | PRN

## 2017-09-02 MED ORDER — DOXYCYCLINE HYCLATE 100 MG PO TABS: 100.0000 mg | ORAL_TABLET | Freq: Two times a day (BID) | ORAL | 0 refills | Status: AC

## 2017-09-02 MED FILL — GUAIATUSSIN AC LIQUID: 100-10 | 8 days supply | Qty: 120 | Fill #0

## 2017-09-02 MED FILL — predniSONE 20 MG TABS: 20 | 3 days supply | Qty: 3 | Fill #0

## 2017-09-02 MED FILL — DOXYCYCLINE HYCLATE 100 MG: 100 | 7 days supply | Qty: 14 | Fill #0

## 2017-09-02 NOTE — Patient Instructions (Signed)

## 2017-09-02 NOTE — Progress Notes (Signed)
  [facility]   Progress Note: General Provider: Lanae Boast, FNP  SUBJECTIVE:   Cynthia Patton is a 57 y.o. female who  has a past medical history of Diabetes mellitus without complication ([place]), Hypertension, and Thyroid disease.. Patient presents today for Cough (still no better from uri x 1 week ago )  . Review of Systems  Constitutional: Negative.   HENT: Positive for congestion. Negative for sore throat.   Eyes: Negative.   Respiratory: Positive for cough, sputum production, shortness of breath and wheezing.   Cardiovascular: Negative.   Neurological: Negative.   Psychiatric/Behavioral: Negative.      OBJECTIVE: BP (!) 146/56 (BP Location: Right Arm, Patient Position: Sitting, Cuff Size: Large)   Pulse 86   Temp 98.9 F (37.2 C) (Oral)   Resp 14   Ht 5\' 3"  (1.6 m)   Wt 218 lb (98.9 kg)   SpO2 98%   BMI 38.62 kg/m   Physical Exam  Constitutional: She is oriented to person, place, and time. She appears well-developed and well-nourished. No distress.  HENT:  Head: Normocephalic and atraumatic.  Eyes: Pupils are equal, round, and reactive to light. Conjunctivae and EOM are normal.  Neck: Normal range of motion.  Cardiovascular: Normal rate, regular rhythm, normal heart sounds and intact distal pulses.  Pulmonary/Chest: Effort normal. No respiratory distress. She has wheezes (mild).  Coughing throughout the exam   Abdominal: Soft. Bowel sounds are normal. She exhibits no distension.  Musculoskeletal: Normal range of motion.  Neurological: She is alert and oriented to person, place, and time.  Skin: Skin is warm and dry.  Psychiatric: She has a normal mood and affect. Her behavior is normal. Thought content normal.  Nursing note and vitals reviewed.   ASSESSMENT/PLAN:   1. Cough - guaiFENesin-codeine (GUAIATUSSIN AC) 100-10 MG/5ML syrup; Take 5 mLs by mouth 3 (three) times daily as needed for cough.  Dispense: 120  mL; Refill: 0 - DG Chest 2 View; Future - predniSONE (DELTASONE) 20 MG tablet; Take 1 tablet (20 mg total) by mouth daily with breakfast for 3 days.  Dispense: 3 tablet; Refill: 0 - doxycycline (VIBRA-TABS) 100 MG tablet; Take 1 tablet (100 mg total) by mouth 2 (two) times daily for 7 days.  Dispense: 20 tablet; Refill: 0         The patient was given clear instructions to go to ER or return to medical center if symptoms do not improve, worsen or new problems develop. The patient verbalized understanding and agreed with plan of care.   Ms. Doug Sou. Cynthia Canary, FNP-BC [facility] [address] [place], [place] [address] [phone]     This note has been created with Dragon speech recognition software and smart phrase technology. Any transcriptional errors are unintentional.

## 2017-09-15 ENCOUNTER — Telehealth: Payer: Self-pay

## 2017-09-15 DIAGNOSIS — I10 Essential (primary) hypertension: Secondary | ICD-10-CM

## 2017-09-15 MED ORDER — DILTIAZEM HCL ER 240 MG PO CP24: 240.0000 mg | ORAL_CAPSULE | Freq: Every day | ORAL | 5 refills | Status: DC

## 2017-09-15 MED FILL — DILTIAZEM 24HR ER 240 MG CA: 240 | 30 days supply | Qty: 30 | Fill #0

## 2017-09-15 NOTE — Telephone Encounter (Signed)
REFILL FOR BP MED SENT INTO PHARMACY

## 2017-09-20 ENCOUNTER — Telehealth: Payer: Self-pay

## 2017-09-20 DIAGNOSIS — I10 Essential (primary) hypertension: Secondary | ICD-10-CM

## 2017-09-20 NOTE — Telephone Encounter (Signed)
Patient called and said the refill for her bp med was the high does and she can not take this dose anymore it is too strong for her. She states she was only taking this dose when her bp was high due to her thyroid being out of range. Please advise if [facility] can [facility] the [facility] Please advise. Thanks!

## 2017-09-21 MED ORDER — DILTIAZEM HCL ER 180 MG PO CP24: 180.0000 mg | ORAL_CAPSULE | Freq: Every day | ORAL | 1 refills | Status: DC

## 2017-09-21 NOTE — Addendum Note (Signed)
Addended by: Genelle Bal on: 09/21/2017 05:31 PM   Modules accepted: Orders

## 2017-09-21 NOTE — Telephone Encounter (Signed)
Call the pharmacy to verify the patient's dose. The last one I see is 240mg 

## 2017-09-21 NOTE — Telephone Encounter (Signed)
Decreased to 180mg  PO QD

## 2017-09-21 NOTE — Telephone Encounter (Signed)
Patient was on 180mg  up until 07/11/2017 when she came in and her bp was elevated and thyroid labs were drawn and thyroid was out of range then. She was informed that this was the reason her bp was hight because the thyroid was out of range. She is asking if she can go back to the 180mg  diltizam because the 240mg  is too much and makes her feel bad. Please advise. Thanks!

## 2017-09-22 NOTE — Telephone Encounter (Signed)
Diltizem 180mg  once daily.

## 2017-10-17 ENCOUNTER — Encounter: Payer: Self-pay | Admitting: Family Medicine

## 2017-10-17 ENCOUNTER — Ambulatory Visit (INDEPENDENT_AMBULATORY_CARE_PROVIDER_SITE_OTHER): Payer: Self-pay | Admitting: Family Medicine

## 2017-10-17 VITALS — BP 150/86 | HR 79 | Temp 98.9°F | Resp 16 | Ht 63.0 in | Wt 216.0 lb

## 2017-10-17 DIAGNOSIS — E118 Type 2 diabetes mellitus with unspecified complications: Secondary | ICD-10-CM

## 2017-10-17 DIAGNOSIS — I10 Essential (primary) hypertension: Secondary | ICD-10-CM

## 2017-10-17 LAB — POCT GLYCOSYLATED HEMOGLOBIN (HGB A1C): Hemoglobin A1C: 6.2 % — AB (ref 4.0–5.6)

## 2017-10-17 LAB — POCT URINALYSIS DIPSTICK
Bilirubin, UA: NEGATIVE
Glucose, UA: NEGATIVE
Ketones, UA: NEGATIVE
Leukocytes, UA: NEGATIVE
Nitrite, UA: NEGATIVE
Protein, UA: NEGATIVE
Spec Grav, UA: 1.015 (ref 1.010–1.025)
Urobilinogen, UA: 1 E.U./dL
pH, UA: 6 (ref 5.0–8.0)

## 2017-10-17 MED FILL — DILT XR 180 MG CAPSULE: 180 | 30 days supply | Qty: 30 | Fill #0

## 2017-10-17 NOTE — Patient Instructions (Addendum)
The 2018 Physical Activity Guidelines recommend the equivalent of 150 minutes per week of moderate to vigorous aerobic activity each week, with muscle-strengthening activities on two days during the week    PRESCRIPTION FOR EXERCISE  Frequency Four to five days per week  Intensity Moderate- During moderate intensity exercise, a person is too winded to sing but is not so winded they cannot talk  Time 30 minutes or a total of 150 minutes per week.   Type Brisk walking PLUS One set each of: 0 Body weight squats  10 0 Plank hold for 30 seconds   Basic bodyweight exercise guide: Squat and plank  (A) Body weight squat: 0 The squat develops strength in the legs and torso. Stand with your feet about shoulder's width apart and slightly externally rotated. Maintain your weight on your heels or midfoot throughout the exercise. Keep your lower back flat; do not allow it to round or to extend excessively. Your knees should remain aligned with your ankles and legs throughout the motion (knees caving inward towards one another is a common problem). Descend until your hips come just below the level of your knees. If strength or mobility limitations prevent you from reaching this depth, begin with partial squats and gradually increase the depth over time. Once you can perform 15 squats with proper technique and full depth, make the exercise more challenging by holding a weight. (B) Standard plank: 0 The basic plank develops torso (core) strength. Maintain a straight torso, not allowing your lower back to sag or your hips to elevate, throughout the exercise. Gradually increase the time you can hold the position.    Hypertension Hypertension is another name for high blood pressure. High blood pressure forces your heart to work harder to pump blood. This can cause problems over time. There are two numbers in a blood pressure reading. There is a top number (systolic) over a bottom number (diastolic). It is  best to have a blood pressure below 120/80. Healthy choices can help lower your blood pressure. You may need medicine to help lower your blood pressure if:  Your blood pressure cannot be lowered with healthy choices.  Your blood pressure is higher than 130/80.  Follow these instructions at home: Eating and drinking  If directed, follow the DASH eating plan. This diet includes: ? Filling half of your plate at each meal with fruits and vegetables. ? Filling one quarter of your plate at each meal with whole grains. Whole grains include whole wheat pasta, brown rice, and whole grain bread. ? Eating or drinking low-fat dairy products, such as skim milk or low-fat yogurt. ? Filling one quarter of your plate at each meal with low-fat (lean) proteins. Low-fat proteins include fish, skinless chicken, eggs, beans, and tofu. ? Avoiding fatty meat, cured and processed meat, or chicken with skin. ? Avoiding premade or processed food.  Eat less than 1,500 mg of salt (sodium) a day.  Limit alcohol use to no more than 1 drink a day for nonpregnant women and 2 drinks a day for men. One drink equals 12 oz of beer, 5 oz of wine, or 1 oz of hard liquor. Lifestyle  Work with your doctor to stay at a healthy weight or to lose weight. Ask your doctor what the best weight is for you.  Get at least 30 minutes of exercise that causes your heart to beat faster (aerobic exercise) most days of the week. This may include walking, swimming, or biking.  Get at least  30 minutes of exercise that strengthens your muscles (resistance exercise) at least 3 days a week. This may include lifting weights or pilates.  Do not use any products that contain nicotine or tobacco. This includes cigarettes and e-cigarettes. If you need help quitting, ask your doctor.  Check your blood pressure at home as told by your doctor.  Keep all follow-up visits as told by your doctor. This is important. Medicines  Take over-the-counter  and prescription medicines only as told by your doctor. Follow directions carefully.  Do not skip doses of blood pressure medicine. The medicine does not work as well if you skip doses. Skipping doses also puts you at risk for problems.  Ask your doctor about side effects or reactions to medicines that you should watch for. Contact a doctor if:  You think you are having a reaction to the medicine you are taking.  You have headaches that keep coming back (recurring).  You feel dizzy.  You have swelling in your ankles.  You have trouble with your vision. Get help right away if:  You get a very bad headache.  You start to feel confused.  You feel weak or numb.  You feel faint.  You get very bad pain in your: ? Chest. ? Belly (abdomen).  You throw up (vomit) more than once.  You have trouble breathing. Summary  Hypertension is another name for high blood pressure.  Making healthy choices can help lower blood pressure. If your blood pressure cannot be controlled with healthy choices, you may need to take medicine. This information is not intended to replace advice given to you by your health care provider. Make sure you discuss any questions you have with your health care provider. Document Released: 06/16/2007 Document Revised: 11/26/2015 Document Reviewed: 11/26/2015 Elsevier Interactive Patient Education  [employer].

## 2017-10-17 NOTE — Progress Notes (Signed)
   [facility]  Chronic Disease Follow Up Provider: Lanae Boast, FNP  SUBJECTIVE:  Patient presents for follow up for the following  chronic conditions.  Patient states that she lost her mother in June and has had an increase in stressors.  Her stressors include looking for new housing and finding a new job.  Patient reports smoking one ppd.  This is an increase that she attributes to grief. Hypertension  Blood pressure is well controlled at home. Patient denies chest pain, dyspnea, fatigue, lower extremity edema, palpitations and syncope. Medication compliance: Yes  Diabetes Last A1c 7.4  home glucose monitoring: is not performed  Hyperlipidemia Compliance with treatment has been good.  Patient denies muscle pain associated with her medications.  her is not exercising and is not adherent to a low-salt, ADA, heart healthy or carbohydrate modified diet.   Review of Systems  Constitutional: Negative.   HENT: Negative.   Eyes: Negative.   Respiratory: Negative.   Cardiovascular: Negative.   Gastrointestinal: Negative.   Genitourinary: Negative.   Musculoskeletal: Negative.   Skin: Negative.   Neurological: Negative.   Psychiatric/Behavioral: Negative.     OBJECTIVE:  There were no vitals taken for this visit.  Physical Exam  Constitutional: She is oriented to person, place, and time and well-developed, well-nourished, and in no distress. No distress.  HENT:  Head: Normocephalic and atraumatic.  Eyes: Pupils are equal, round, and reactive to light. Conjunctivae and EOM are normal.  Neck: Normal range of motion. Neck supple.  Cardiovascular: Normal rate, regular rhythm and intact distal pulses. Exam reveals no gallop and no friction rub.  No murmur heard. Pulmonary/Chest: Effort normal and breath sounds normal. No respiratory distress. She has no wheezes.  Abdominal: Soft. Bowel sounds are normal. There is no tenderness.    Musculoskeletal: Normal range of motion. She exhibits no edema or tenderness.  Lymphadenopathy:    She has no cervical adenopathy.  Neurological: She is alert and oriented to person, place, and time. Gait normal.  Skin: Skin is warm and dry.  Psychiatric: Mood, memory, affect and judgment normal.  Nursing note and vitals reviewed.    ASSESSMENT/PLAN:  1. Essential hypertension No medication changes at the present time. - Urinalysis Dipstick - Basic Metabolic Panel  2. Controlled type 2 diabetes mellitus with complication, without long-term current use of insulin (HCC) A1c is at goal.  Continue with current medications. - HgB E9M - Basic Metabolic Panel  Encouraged grief counseling, journaling, relaxation techniques.  Return to care as scheduled and prn. Patient verbalized understanding and agreed with plan of care.   [name], FNP-BC [facility] [address] [place], [place] [address] [phone]

## 2017-10-18 LAB — BASIC METABOLIC PANEL
BUN/Creatinine Ratio: 7 — ABNORMAL LOW (ref 9–23)
BUN: 7 mg/dL (ref 6–24)
CO2: 24 mmol/L (ref 20–29)
Calcium: 9 mg/dL (ref 8.7–10.2)
Chloride: 106 mmol/L (ref 96–106)
Creatinine, Ser: 0.97 mg/dL (ref 0.57–1.00)
GFR calc Af Amer: 74 mL/min/{1.73_m2} (ref >59)
GFR calc non Af Amer: 65 mL/min/{1.73_m2} (ref >59)
Glucose: 79 mg/dL (ref 65–99)
Potassium: 3.8 mmol/L (ref 3.5–5.2)
Sodium: 146 mmol/L — ABNORMAL HIGH (ref 134–144)

## 2017-10-19 ENCOUNTER — Telehealth: Payer: Self-pay

## 2017-10-19 DIAGNOSIS — I10 Essential (primary) hypertension: Secondary | ICD-10-CM

## 2017-10-19 MED ORDER — DILTIAZEM HCL ER 180 MG PO CP24: 180.0000 mg | ORAL_CAPSULE | Freq: Every day | ORAL | 1 refills | Status: DC

## 2017-10-19 NOTE — Telephone Encounter (Signed)
[facility]

## 2017-10-26 ENCOUNTER — Encounter: Payer: Self-pay | Admitting: Family Medicine

## 2017-10-26 NOTE — Progress Notes (Signed)
Please print this lab letter and send to [facility]

## 2017-11-30 ENCOUNTER — Telehealth: Payer: Self-pay

## 2017-11-30 DIAGNOSIS — I10 Essential (primary) hypertension: Secondary | ICD-10-CM

## 2017-11-30 DIAGNOSIS — E038 Other specified hypothyroidism: Secondary | ICD-10-CM

## 2017-11-30 DIAGNOSIS — F32A Depression, unspecified: Secondary | ICD-10-CM

## 2017-11-30 DIAGNOSIS — F329 Major depressive disorder, single episode, unspecified: Secondary | ICD-10-CM

## 2017-11-30 MED ORDER — BUPROPION HCL ER (XL) 150 MG PO TB24: 150.0000 mg | ORAL_TABLET | Freq: Every day | ORAL | 0 refills | Status: DC

## 2017-11-30 MED ORDER — LEVOTHYROXINE SODIUM 150 MCG PO TABS: 137.0000 ug | ORAL_TABLET | Freq: Every day | ORAL | 0 refills | Status: DC

## 2017-11-30 MED ORDER — DILTIAZEM HCL ER 180 MG PO CP24: 180.0000 mg | ORAL_CAPSULE | Freq: Every day | ORAL | 0 refills | Status: DC

## 2017-11-30 NOTE — Telephone Encounter (Signed)
Refills sent into pharmacy. Thanks!  

## 2017-12-29 ENCOUNTER — Telehealth: Payer: Self-pay

## 2017-12-29 DIAGNOSIS — E038 Other specified hypothyroidism: Secondary | ICD-10-CM

## 2017-12-29 MED ORDER — LEVOTHYROXINE SODIUM 150 MCG PO TABS: 137.0000 ug | ORAL_TABLET | Freq: Every day | ORAL | 0 refills | Status: DC

## 2017-12-29 NOTE — Telephone Encounter (Signed)
Sent into [facility]. Thanks!

## 2018-01-20 ENCOUNTER — Ambulatory Visit: Payer: Self-pay | Admitting: Family Medicine

## 2018-02-13 ENCOUNTER — Ambulatory Visit: Payer: Self-pay | Admitting: Family Medicine

## 2018-02-13 ENCOUNTER — Telehealth: Payer: Self-pay

## 2018-02-13 DIAGNOSIS — E038 Other specified hypothyroidism: Secondary | ICD-10-CM

## 2018-02-13 MED ORDER — LEVOTHYROXINE SODIUM 150 MCG PO TABS: 137.0000 ug | ORAL_TABLET | Freq: Every day | ORAL | 0 refills | Status: DC

## 2018-02-13 NOTE — Telephone Encounter (Signed)
Refills sent into pharmacy. Thanks!  

## 2018-02-16 ENCOUNTER — Ambulatory Visit (INDEPENDENT_AMBULATORY_CARE_PROVIDER_SITE_OTHER): Payer: Self-pay | Admitting: Family Medicine

## 2018-02-16 VITALS — BP 154/73 | HR 82 | Temp 98.5°F | Resp 16 | Ht 63.0 in | Wt 229.0 lb

## 2018-02-16 DIAGNOSIS — I10 Essential (primary) hypertension: Secondary | ICD-10-CM

## 2018-02-16 DIAGNOSIS — J40 Bronchitis, not specified as acute or chronic: Secondary | ICD-10-CM

## 2018-02-16 DIAGNOSIS — L720 Epidermal cyst: Secondary | ICD-10-CM

## 2018-02-16 DIAGNOSIS — R0989 Other specified symptoms and signs involving the circulatory and respiratory systems: Secondary | ICD-10-CM

## 2018-02-16 MED ORDER — IPRATROPIUM BROMIDE 0.02 % IN SOLN
0.5000 mg | Freq: Once | RESPIRATORY_TRACT | Status: AC
  Administered 2018-02-16: 0.5 mg via RESPIRATORY_TRACT

## 2018-02-16 MED ORDER — METHYLPREDNISOLONE 4 MG PO TBPK: ORAL_TABLET | ORAL | 0 refills | Status: DC

## 2018-02-16 MED ORDER — ALBUTEROL SULFATE HFA 108 (90 BASE) MCG/ACT IN AERS: 2.0000 | INHALATION_SPRAY | RESPIRATORY_TRACT | 1 refills | Status: DC | PRN

## 2018-02-16 MED ORDER — HYDROCODONE-HOMATROPINE 5-1.5 MG/5ML PO SYRP: 5.0000 mL | ORAL_SOLUTION | Freq: Three times a day (TID) | ORAL | 0 refills | Status: DC | PRN

## 2018-02-16 MED ORDER — ALBUTEROL SULFATE (2.5 MG/3ML) 0.083% IN NEBU
2.5000 mg | INHALATION_SOLUTION | Freq: Once | RESPIRATORY_TRACT | Status: AC
  Administered 2018-02-16: 2.5 mg via RESPIRATORY_TRACT

## 2018-02-16 MED ORDER — DILTIAZEM HCL ER 180 MG PO CP24: 180.0000 mg | ORAL_CAPSULE | Freq: Every day | ORAL | 0 refills | Status: DC

## 2018-02-16 NOTE — Progress Notes (Signed)
Patient [facility]   Progress Note: Sick Visit Provider: Lanae Boast, FNP  SUBJECTIVE:   Cynthia Patton is a 58 y.o. female who  has a past medical history of Diabetes mellitus without complication ([place]), Hypertension, and Thyroid disease.. Patient presents today for Cough (dry cough x 4 days ) and Recurrent Skin Infections (on right upper leg )  Cough  This is a new problem. The current episode started in the past 7 days. The problem has been gradually worsening. The problem occurs every few minutes. The cough is non-productive. Associated symptoms include a rash, a sore throat, shortness of breath and wheezing. Pertinent negatives include no postnasal drip. The symptoms are aggravated by cold air. She has tried OTC cough suppressant for the symptoms. The treatment provided mild relief. Her past medical history is significant for bronchitis and environmental allergies.  Rash  This is a recurrent problem. The current episode started more than 1 month ago. The problem is unchanged. The affected locations include the right upper leg. The rash is characterized by blistering. She was exposed to nothing. Associated symptoms include coughing, shortness of breath and a sore throat. Past treatments include nothing. Her past medical history is significant for allergies.    Review of Systems  Constitutional: Negative.   HENT: Positive for sore throat. Negative for postnasal drip.   Eyes: Negative.   Respiratory: Positive for cough, shortness of breath and wheezing.   Gastrointestinal: Negative.   Genitourinary: Negative.   Musculoskeletal: Negative.   Skin: Positive for rash.  Neurological: Negative.   Endo/Heme/Allergies: Positive for environmental allergies.  Psychiatric/Behavioral: Negative.      OBJECTIVE: BP (!) 154/73 (BP Location: Right Arm, Patient Position: Sitting, Cuff Size: Large)   Pulse 82   Temp 98.5 F (36.9 C) (Oral)   Resp 16    Ht 5\' 3"  (1.6 m)   Wt 229 lb (103.9 kg)   SpO2 100%   BMI 40.57 kg/m   Wt Readings from Last 3 Encounters:  02/16/18 229 lb (103.9 kg)  10/17/17 216 lb (98 kg)  09/02/17 218 lb (98.9 kg)     Physical Exam Vitals signs and nursing note reviewed.  Constitutional:      General: She is not in acute distress.    Appearance: She is well-developed.  HENT:     Head: Normocephalic and atraumatic.  Eyes:     Conjunctiva/sclera: Conjunctivae normal.     Pupils: Pupils are equal, round, and reactive to light.  Neck:     Musculoskeletal: Normal range of motion.  Cardiovascular:     Rate and Rhythm: Normal rate and regular rhythm.     Heart sounds: Normal heart sounds.  Pulmonary:     Effort: Pulmonary effort is normal. No respiratory distress.     Breath sounds: Wheezing present.  Abdominal:     General: Bowel sounds are normal. There is no distension.     Palpations: Abdomen is soft.  Musculoskeletal: Normal range of motion.  Skin:    General: Skin is warm and dry.       Neurological:     Mental Status: She is alert and oriented to person, place, and time.  Psychiatric:        Mood and Affect: Mood normal.        Behavior: Behavior normal.        Thought Content: Thought content normal.        Judgment: Judgment normal.  ASSESSMENT/PLAN:   1. Essential hypertension No medication changes warranted at the present time.   - diltiazem (DILACOR XR) 180 MG 24 hr capsule; Take 1 capsule (180 mg total) by mouth daily.  Dispense: 30 capsule; Refill: 0  2. Symptoms of upper respiratory infection (URI) Most likely viral in nature - albuterol (PROVENTIL HFA;VENTOLIN HFA) 108 (90 Base) MCG/ACT inhaler; Inhale 2 puffs into the lungs every 4 (four) hours as needed for wheezing or shortness of breath.  Dispense: 1 Inhaler; Refill: 1 - albuterol (PROVENTIL) (2.5 MG/3ML) 0.083% nebulizer solution 2.5 mg - ipratropium (ATROVENT) nebulizer solution 0.5 mg  3. Bronchitis Discussed  smoking cessation. Smoking cessation instruction/counseling given:  counseled patient on the dangers of tobacco use, advised patient to stop smoking, and reviewed strategies to maximize success - methylPREDNISolone (MEDROL DOSEPAK) 4 MG TBPK tablet; Take as directed on pack  Dispense: 21 tablet; Refill: 0 - albuterol (PROVENTIL) (2.5 MG/3ML) 0.083% nebulizer solution 2.5 mg - ipratropium (ATROVENT) nebulizer solution 0.5 mg - HYDROcodone-homatropine (HYCODAN) 5-1.5 MG/5ML syrup; Take 5 mLs by mouth every 8 (eight) hours as needed for cough.  Dispense: 120 mL; Refill: 0  4. Cyst of skin and subcutaneous tissue Referred - Ambulatory referral to Dermatology       The patient was given clear instructions to go to ER or return to medical center if symptoms do not improve, worsen or new problems develop. The patient verbalized understanding and agreed with plan of care.   [name], FNP-BC [facility] [address] [place], [place] [address] [phone]     This note has been created with Dragon speech recognition software and smart phrase technology. Any transcriptional errors are unintentional.

## 2018-02-17 ENCOUNTER — Ambulatory Visit: Payer: Self-pay | Admitting: Family Medicine

## 2018-02-21 ENCOUNTER — Telehealth: Payer: Self-pay

## 2018-02-21 NOTE — Telephone Encounter (Signed)
Patient called and said that she has stopped taking the medrol dosepak because it made her heart "flutter" and she felt a flushing feeling. Please advised if there is something else she can take? Thanks!

## 2018-02-22 ENCOUNTER — Emergency Department (HOSPITAL_COMMUNITY): Payer: BLUE CROSS/BLUE SHIELD

## 2018-02-22 ENCOUNTER — Emergency Department (HOSPITAL_COMMUNITY)
Admission: EM | Admit: 2018-02-22 | Discharge: 2018-02-22 | Disposition: A | Discharge status: Home / Self Care | Payer: BLUE CROSS/BLUE SHIELD | Attending: Emergency Medicine | Admitting: Emergency Medicine

## 2018-02-22 ENCOUNTER — Encounter: Payer: Self-pay | Admitting: Family Medicine

## 2018-02-22 ENCOUNTER — Encounter (HOSPITAL_COMMUNITY): Payer: Self-pay | Admitting: Emergency Medicine

## 2018-02-22 DIAGNOSIS — J40 Bronchitis, not specified as acute or chronic: Secondary | ICD-10-CM | POA: Insufficient documentation

## 2018-02-22 DIAGNOSIS — E119 Type 2 diabetes mellitus without complications: Secondary | ICD-10-CM | POA: Insufficient documentation

## 2018-02-22 DIAGNOSIS — Z7982 Long term (current) use of aspirin: Secondary | ICD-10-CM | POA: Insufficient documentation

## 2018-02-22 DIAGNOSIS — R05 Cough: Secondary | ICD-10-CM | POA: Diagnosis not present

## 2018-02-22 DIAGNOSIS — F17210 Nicotine dependence, cigarettes, uncomplicated: Secondary | ICD-10-CM | POA: Diagnosis not present

## 2018-02-22 DIAGNOSIS — I10 Essential (primary) hypertension: Secondary | ICD-10-CM | POA: Insufficient documentation

## 2018-02-22 DIAGNOSIS — Z7984 Long term (current) use of oral hypoglycemic drugs: Secondary | ICD-10-CM | POA: Insufficient documentation

## 2018-02-22 DIAGNOSIS — Z79899 Other long term (current) drug therapy: Secondary | ICD-10-CM | POA: Insufficient documentation

## 2018-02-22 MED ORDER — DEXAMETHASONE 2 MG PO TABS: 2.0000 mg | ORAL_TABLET | Freq: Every day | ORAL | 0 refills | Status: AC

## 2018-02-22 NOTE — ED Notes (Signed)
Patient transported to X-ray 

## 2018-02-22 NOTE — Discharge Instructions (Addendum)
Take the medications as prescribed.  Follow up with your doctor if not improving by next week.  Continue to try to quit smoking

## 2018-02-22 NOTE — ED Provider Notes (Signed)
[facility] EMERGENCY DEPARTMENT Provider Note   CSN: [id number] Arrival date & time: 02/22/18  1015     History   Chief Complaint Chief Complaint  Patient presents with  . Chills  . Cough    HPI Cynthia Patton is a 58 y.o. female.  HPI Pt complains of persistent cough for the past week.  Pt has mucus that she can't get up.  She is having joint aches and body aches.  She has pain in her right lower chest.  Pt does smoke.  Patient saw her doctor on the sixth.  She was diagnosed with bronchitis.  She was given a prescription for steroids and cough medication.  Patient states she is not coughing much but she still feels like there is mucus that she cannot get up.  She stopped taking the steroids because she did not like the taste and she felt like her heart was racing.  She has not had any fevers.  She does not feel short of breath.  She does not think she wheezes unless she is lying down.  Patient is frustrated because she is not getting better so she came to the ED.  Past Medical History:  Diagnosis Date  . Diabetes mellitus without complication ([place])   . Hypertension   . Thyroid disease     Patient Active Problem List   Diagnosis Date Noted  . Positive ANA (antinuclear antibody) 04/06/2016  . Hyperlipidemia 03/19/2016  . Controlled type 2 diabetes mellitus with complication, without long-term current use of insulin ([place]) 02/09/2016  . Essential hypertension 02/09/2016  . Health care maintenance 02/24/2015    Past Surgical History:  Procedure Laterality Date  . ABDOMINAL HYSTERECTOMY    . CHOLECYSTECTOMY       OB History   No obstetric history on file.      Home Medications    Prior to Admission medications   Medication Sig Start Date End Date Taking? Authorizing Provider  albuterol (PROVENTIL HFA;VENTOLIN HFA) 108 (90 Base) MCG/ACT inhaler Inhale 2 puffs into the lungs every 4 (four) hours as needed for wheezing or shortness of breath. 02/16/18    Lanae Boast, FNP  aspirin EC 81 MG tablet Take 1 tablet (81 mg total) by mouth daily. 03/19/16   Dorena Dew, FNP  buPROPion (WELLBUTRIN XL) 150 MG 24 hr tablet Take 1 tablet (150 mg total) by mouth daily. Patient not taking: Reported on 02/16/2018 11/30/17   Lanae Boast, FNP  dexamethasone (DECADRON) 2 MG tablet Take 1 tablet (2 mg total) by mouth daily for 5 days. 02/22/18 02/27/18  Dorie Rank, MD  diltiazem (DILACOR XR) 180 MG 24 hr capsule Take 1 capsule (180 mg total) by mouth daily. 02/16/18   Lanae Boast, FNP  HYDROcodone-homatropine [facility]) 5-1.5 MG/5ML syrup Take 5 mLs by mouth every 8 (eight) hours as needed for cough. 02/16/18   Lanae Boast, FNP  levothyroxine (SYNTHROID, LEVOTHROID) 150 MCG tablet Take 1 tablet (150 mcg total) by mouth daily before breakfast. 02/13/18   Lanae Boast, FNP  metFORMIN (GLUCOPHAGE) 500 MG tablet Take by mouth 2 (two) times daily with a meal.    [provider]  methylPREDNISolone (MEDROL DOSEPAK) 4 MG TBPK tablet Take as directed on pack 02/16/18   Lanae Boast, FNP  mometasone (NASONEX) 50 MCG/ACT nasal spray Place 2 sprays into the nose daily. Patient not taking: Reported on 02/16/2018 10/14/16   Dorena Dew, FNP  omega-3 acid ethyl esters (LOVAZA) 1 g capsule Take  2 capsules (2 g total) by mouth 2 (two) times daily. Patient not taking: Reported on 10/17/2017 05/20/16   Dorena Dew, FNP  saxagliptin HCl (ONGLYZA) 2.5 MG TABS tablet Take 1 tablet (2.5 mg total) by mouth daily. Patient not taking: Reported on 10/17/2017 07/11/17   Dorena Dew, FNP    Family History No family history on file.  Social History Social History   Tobacco Use  . Smoking status: Current Every Day Smoker    Packs/day: 0.50    Types: Cigarettes  . Smokeless tobacco: Never Used  Substance Use Topics  . Alcohol use: No  . Drug use: No     Allergies   Floxin [ocuflox] and Percocet [oxycodone-acetaminophen]   Review of Systems Review of  Systems  All other systems reviewed and are negative.    Physical Exam Updated Vital Signs BP (!) 162/80 (BP Location: Right Arm)   Pulse 89   Temp 98.1 F (36.7 C) (Oral)   Resp 16   SpO2 100%   Physical Exam Vitals signs and nursing note reviewed.  Constitutional:      General: She is not in acute distress.    Appearance: She is well-developed.  HENT:     Head: Normocephalic and atraumatic.     Right Ear: External ear normal.     Left Ear: External ear normal.  Eyes:     General: No scleral icterus.       Right eye: No discharge.        Left eye: No discharge.     Conjunctiva/sclera: Conjunctivae normal.  Neck:     Musculoskeletal: Neck supple.     Trachea: No tracheal deviation.  Cardiovascular:     Rate and Rhythm: Normal rate and regular rhythm.  Pulmonary:     Effort: Pulmonary effort is normal. No respiratory distress.     Breath sounds: Normal breath sounds. No stridor. No wheezing or rales.  Abdominal:     General: Bowel sounds are normal. There is no distension.     Palpations: Abdomen is soft.     Tenderness: There is no abdominal tenderness. There is no guarding or rebound.  Musculoskeletal:        General: No tenderness.  Skin:    General: Skin is warm and dry.     Findings: No rash.  Neurological:     Mental Status: She is alert.     Cranial Nerves: No cranial nerve deficit (no facial droop, extraocular movements intact, no slurred speech).     Sensory: No sensory deficit.     Motor: No abnormal muscle tone or seizure activity.     Coordination: Coordination normal.      ED Treatments / Results  Labs (all labs ordered are listed, but only abnormal results are displayed) Labs Reviewed - No data to display  EKG None  Radiology Dg Chest 2 View  Result Date: 02/22/2018 CLINICAL DATA:  Cough.  Hypertension. EXAM: CHEST - 2 VIEW COMPARISON:  September 02, 2017 FINDINGS: Lungs are clear. Heart size and pulmonary vascularity are normal. No  adenopathy. There is aortic atherosclerosis. No evident bone lesions. IMPRESSION: No edema or consolidation. Aortic Atherosclerosis (ICD10-I70.0). Electronically Signed   By: Lowella Grip III M.D.   On: 02/22/2018 11:03    Procedures Procedures (including critical care time)  Medications Ordered in ED Medications - No data to display   Initial Impression / Assessment and Plan / ED Course  I have reviewed the triage vital signs  and the nursing notes.  Pertinent labs & imaging results that were available during my care of the patient were reviewed by me and considered in my medical decision making (see chart for details).  Clinical Course as of Feb 23 1123  Wed Feb 22, 2018  1114 Discussed treatment   [JK]    Clinical Course User Index [JK] Dorie Rank, MD  Patient's chest x-ray does not show pneumonia.  She is not wheezing on my exam.  She does not appear to be in any distress.  I discussed treatment options with the patient.  She does not want to try Mucinex.  She states that never works for her.  Patient also does not want any cough medications because she does not feel like she is coughing much and that would not be helpful.  Patient wants a medication to break up the mucus.  I explained to her other than the Mucinex I do not think there is a lot of other options.  We could try an alternative steroid.  It is possible she may have the same side effect but this 1 may not disagree with her as much.  Patient agrees to try a course of Decadron.  Also discussed smoking cessation.  Final Clinical Impressions(s) / ED Diagnoses   Final diagnoses:  Bronchitis    ED Discharge Orders         Ordered    dexamethasone (DECADRON) 2 MG tablet  Daily     02/22/18 1122           Dorie Rank, MD 02/22/18 1125

## 2018-02-22 NOTE — ED Triage Notes (Signed)
Pt reports non productive cough with chills and "just feeling bad like I have the flu or bronchitis". Denies any cp or sob.

## 2018-02-22 NOTE — Telephone Encounter (Signed)
Called and spoke with patient. She had said yesterday that symptoms had stopped since she stopped taking medication. Asked about the cough medication, she states this has not helped at all and she is still not feeling well. I asked her if she needed to be seen again she says she is on the way to the ER at this moment. Thanks!

## 2018-02-28 ENCOUNTER — Telehealth: Payer: Self-pay

## 2018-03-01 NOTE — Telephone Encounter (Signed)
Called, no answer. Left a message for patient to call back. Thanks!  

## 2018-03-03 ENCOUNTER — Encounter (INDEPENDENT_AMBULATORY_CARE_PROVIDER_SITE_OTHER): Payer: BLUE CROSS/BLUE SHIELD | Admitting: Family Medicine

## 2018-03-03 ENCOUNTER — Encounter: Payer: Self-pay | Admitting: Family Medicine

## 2018-03-03 ENCOUNTER — Telehealth: Payer: Self-pay | Admitting: Family Medicine

## 2018-03-03 VITALS — BP 136/62 | HR 69 | Temp 98.0°F | Resp 16 | Ht 63.0 in | Wt 228.0 lb

## 2018-03-03 DIAGNOSIS — I10 Essential (primary) hypertension: Secondary | ICD-10-CM

## 2018-03-03 LAB — POCT URINALYSIS DIPSTICK
Bilirubin, UA: NEGATIVE
Glucose, UA: NEGATIVE
Ketones, UA: NEGATIVE
Leukocytes, UA: NEGATIVE
Nitrite, UA: NEGATIVE
Protein, UA: POSITIVE — AB
Spec Grav, UA: 1.02 (ref 1.010–1.025)
Urobilinogen, UA: NEGATIVE E.U./dL — AB
pH, UA: 5.5 (ref 5.0–8.0)

## 2018-03-03 LAB — POCT GLYCOSYLATED HEMOGLOBIN (HGB A1C): Hemoglobin A1C: 8.5 % — AB (ref 4.0–5.6)

## 2018-03-03 NOTE — Telephone Encounter (Signed)
Patient left without being seen by the provider today.

## 2018-03-16 ENCOUNTER — Telehealth: Payer: Self-pay

## 2018-03-16 DIAGNOSIS — E038 Other specified hypothyroidism: Secondary | ICD-10-CM

## 2018-03-16 MED ORDER — LEVOTHYROXINE SODIUM 150 MCG PO TABS: 137.0000 ug | ORAL_TABLET | Freq: Every day | ORAL | 2 refills | Status: DC

## 2018-03-16 NOTE — Telephone Encounter (Signed)
Medication sent to pharmacy  

## 2018-03-28 ENCOUNTER — Ambulatory Visit: Payer: Self-pay | Admitting: Family Medicine

## 2018-03-28 ENCOUNTER — Encounter: Payer: Self-pay | Admitting: Family Medicine

## 2018-03-28 ENCOUNTER — Other Ambulatory Visit: Payer: Self-pay

## 2018-03-28 ENCOUNTER — Ambulatory Visit (INDEPENDENT_AMBULATORY_CARE_PROVIDER_SITE_OTHER): Payer: BLUE CROSS/BLUE SHIELD | Admitting: Family Medicine

## 2018-03-28 VITALS — BP 140/76 | HR 95 | Temp 98.1°F | Ht 63.0 in | Wt 227.0 lb

## 2018-03-28 DIAGNOSIS — Z09 Encounter for follow-up examination after completed treatment for conditions other than malignant neoplasm: Secondary | ICD-10-CM

## 2018-03-28 DIAGNOSIS — B379 Candidiasis, unspecified: Secondary | ICD-10-CM

## 2018-03-28 DIAGNOSIS — R238 Other skin changes: Secondary | ICD-10-CM

## 2018-03-28 DIAGNOSIS — J302 Other seasonal allergic rhinitis: Secondary | ICD-10-CM

## 2018-03-28 DIAGNOSIS — N898 Other specified noninflammatory disorders of vagina: Secondary | ICD-10-CM

## 2018-03-28 DIAGNOSIS — J40 Bronchitis, not specified as acute or chronic: Secondary | ICD-10-CM

## 2018-03-28 DIAGNOSIS — R8290 Unspecified abnormal findings in urine: Secondary | ICD-10-CM | POA: Diagnosis not present

## 2018-03-28 DIAGNOSIS — E118 Type 2 diabetes mellitus with unspecified complications: Secondary | ICD-10-CM

## 2018-03-28 LAB — POCT URINALYSIS DIP (MANUAL ENTRY)
Bilirubin, UA: NEGATIVE
Glucose, UA: 500 mg/dL — AB
Leukocytes, UA: NEGATIVE
Nitrite, UA: NEGATIVE
Protein Ur, POC: 30 mg/dL — AB
Spec Grav, UA: 1.025 (ref 1.010–1.025)
Urobilinogen, UA: 0.2 U/dL
pH, UA: 6 (ref 5.0–8.0)

## 2018-03-28 LAB — GLUCOSE, POCT (MANUAL RESULT ENTRY): POC Glucose: 200 mg/dl — AB (ref 70–99)

## 2018-03-28 MED ORDER — FLUCONAZOLE 150 MG PO TABS: 150.0000 mg | ORAL_TABLET | Freq: Once | ORAL | 0 refills | Status: DC

## 2018-03-28 MED ORDER — LEVOCETIRIZINE DIHYDROCHLORIDE 5 MG PO TABS: 5.0000 mg | ORAL_TABLET | Freq: Every evening | ORAL | 3 refills | Status: DC

## 2018-03-28 NOTE — Patient Instructions (Addendum)
Levocetirizine Oral Tablets What is this medicine? LEVOCETIRIZINE (LEE voe se TIR i zeen) is an antihistamine. This medicine is used to treat or prevent symptoms of allergies. It is also used to help reduce itchy skin rash and hives. This medicine may be used for other purposes; ask your health care provider or ph   Allergies, Adult An allergy means that your body reacts to something that bothers it (allergen). It is not a normal reaction. This can happen from something that you:  Eat.  Breathe in.  Touch. You can have an allergy (be allergic) to:  Outdoor things, like: ? Pollen. ? Grass. ? Weeds.  Indoor things, like: ? Dust. ? Smoke. ? Pet dander.  Foods.  Medicines.  Things that bother your skin, like: ? Detergents. ? Chemicals. ? Latex.  Perfume.  Bugs. An allergy cannot spread from person to person (is not contagious). Follow these instructions at home:         Stay away from things that you know you are allergic to.  If you have allergies to things in the air, wash out your nose each day. Do it with one of these: ? A salt-water (saline) spray. ? A container (neti pot).  Take over-the-counter and prescription medicines only as told by your doctor.  Keep all follow-up visits as told by your doctor. This is important.  If you are at risk for a very bad allergy reaction (anaphylaxis), keep an auto-injector with you all the time. This is called an epinephrine injection. ? This is pre-measured medicine with a needle. You can put it into your skin by yourself. ? Right after you have a very bad allergy reaction, you or a person with you must give the medicine in less than a few minutes. This is an emergency.  If you have ever had a very bad allergy reaction, wear a medical alert bracelet or necklace. Your very bad allergy should be written on it. Contact a health care provider if:  Your symptoms do not get better with treatment. Get help right away if:   You have symptoms of a very bad allergy reaction. These include: ? A swollen mouth, tongue, or throat. ? Pain or tightness in your chest. ? Trouble breathing. ? Being short of breath. ? Dizziness. ? Fainting. ? Very bad pain in your belly (abdomen). ? Throwing up (vomiting). ? Watery poop (diarrhea). Summary  An allergy means that your body reacts to something that bothers it (allergen). It is not a normal reaction.  Stay away from things that make your body react.  Take over-the-counter and prescription medicines only as told by your doctor.  If you are at risk for a very bad allergy reaction, carry an auto-injector (epinephrine injection) all the time. Also, wear a medical alert bracelet or necklace so people know about your allergy. This information is not intended to replace advice given to you by your health care provider. Make sure you discuss any questions you have with your health care provider. Document Released: 04/24/2012 Document Revised: 04/12/2016 Document Reviewed: 04/12/2016 Elsevier Interactive Patient Education  2019 [employer]. [employer] if you have questions. COMMON BRAND NAME(S): Xyzal, Xyzal Allergy 24 Hour What should I tell my health care provider before I take this medicine? They need to know if you have any of these conditions: -kidney disease -an unusual or allergic reaction to levocetirizine, cetirizine, hydroxyzine, other medicines, foods, dyes, or preservatives -pregnant or trying to get pregnant -breast-feeding How should I use this medicine?  Take this medicine by mouth with a glass of water. Take it at night. The tablet may be split in half. Do not chew the tablets. Follow the directions on the prescription label. You can take it with or without food. Do not take more medicine than directed. You may need to take this medicine for several days before your symptoms improve. Talk to your pediatrician regarding the use of this medicine in children. While  this drug may be prescribed for children as young as 21 years old for selected conditions, precautions do apply. Overdosage: If you think you have taken too much of this medicine contact a poison control center or emergency room at once. NOTE: This medicine is only for you. Do not share this medicine with others. What if I miss a dose? If you miss a dose, take it as soon as you can. If it is almost time for your next dose, take only that dose. Do not take double or extra doses. What may interact with this medicine? -alcohol -MAOIs like Carbex, Eldepryl, Marplan, Nardil, and Parnate -medicines that cause drowsiness or sleep -other medicines for colds or allergies -ritonavir -theophylline This list may not describe all possible interactions. Give your health care provider a list of all the medicines, herbs, non-prescription drugs, or dietary supplements you use. Also tell them if you smoke, drink alcohol, or use illegal drugs. Some items may interact with your medicine. What should I watch for while using this medicine? Visit your doctor or health care professional for regular checks on your health. Tell your doctor or healthcare professional if your symptoms do not start to get better or if they get worse. You may get drowsy or dizzy. Do not drive, use machinery, or do anything that needs mental alertness until you know how this medicine affects you. Do not stand or sit up quickly, especially if you are an older patient. This reduces the risk of dizzy or fainting spells. Alcohol may interfere with the effect of this medicine. Avoid alcoholic drinks. Your mouth may get dry. Chewing sugarless gum or sucking hard candy, and drinking plenty of water may help. Contact your doctor if the problem does not go away or is severe. What side effects may I notice from receiving this medicine? Side effects that you should report to your doctor or health care professional as soon as possible: -allergic reactions  like skin rash, itching or hives, swelling of the face, lips, or tongue -changes in vision or hearing -fever -trouble passing urine or change in the amount of urine Side effects that usually do not require medical attention (report to your doctor or health care professional if they continue or are bothersome): -cough -dizziness -drowsiness or tiredness -dry mouth -muscle aches This list may not describe all possible side effects. Call your doctor for medical advice about side effects. You may report side effects to FDA at 1-800-FDA-1088. Where should I keep my medicine? Keep out of the reach of children. Store at room temperature between 15 and 30 degrees C (59 and 86 degrees F). Throw away any unused medicine after the expiration date. NOTE: This sheet is a summary. It may not cover all possible information. If you have questions about this medicine, talk to your doctor, pharmacist, or health care provider.  2019 Elsevier/Gold Standard (2007-09-20 11:17:47)    Acute Bronchitis, Adult Acute bronchitis is when air tubes (bronchi) in the lungs suddenly get swollen. The condition can make it hard to breathe. It can also cause  these symptoms:  A cough.  Coughing up clear, yellow, or green mucus.  Wheezing.  Chest congestion.  Shortness of breath.  A fever.  Body aches.  Chills.  A sore throat. Follow these instructions at home:  Medicines  Take over-the-counter and prescription medicines only as told by your doctor.  If you were prescribed an antibiotic medicine, take it as told by your doctor. Do not stop taking the antibiotic even if you start to feel better. General instructions  Rest.  Drink enough fluids to keep your pee (urine) pale yellow.  Avoid smoking and secondhand smoke. If you smoke and you need help quitting, ask your doctor. Quitting will help your lungs heal faster.  Use an inhaler, cool mist vaporizer, or humidifier as told by your doctor.  Keep  all follow-up visits as told by your doctor. This is important. How is this prevented? To lower your risk of getting this condition again:  Wash your hands often with soap and water. If you cannot use soap and water, use hand sanitizer.  Avoid contact with people who have cold symptoms.  Try not to touch your hands to your mouth, nose, or eyes.  Make sure to get the flu shot every year. Contact a doctor if:  Your symptoms do not get better in 2 weeks. Get help right away if:  You cough up blood.  You have chest pain.  You have very bad shortness of breath.  You become dehydrated.  You faint (pass out) or keep feeling like you are going to pass out.  You keep throwing up (vomiting).  You have a very bad headache.  Your fever or chills gets worse. This information is not intended to replace advice given to you by your health care provider. Make sure you discuss any questions you have with your health care provider. Document Released: 06/16/2007 Document Revised: 08/11/2016 Document Reviewed: 06/18/2015 Elsevier Interactive Patient Education  2019 [employer]   Fluconazole tablets What is this medicine? FLUCONAZOLE (floo KON na zole) is an antifungal medicine. It is used to treat certain kinds of fungal or yeast infections. This medicine may be used for other purposes; ask your health care provider or pharmacist if you have questions. COMMON BRAND NAME(S): Diflucan What should I tell my health care provider before I take this medicine? They need to know if you have any of these conditions: -history of irregular heart beat -kidney disease -an unusual or allergic reaction to fluconazole, other azole antifungals, medicines, foods, dyes, or preservatives -pregnant or trying to get pregnant -breast-feeding How should I use this medicine? Take this medicine by mouth. Follow the directions on the prescription label. Do not take your medicine more often than directed. Talk  to your pediatrician regarding the use of this medicine in children. Special care may be needed. This medicine has been used in children as young as 28 months of age. Overdosage: If you think you have taken too much of this medicine contact a poison control center or emergency room at once. NOTE: This medicine is only for you. Do not share this medicine with others. What if I miss a dose? If you miss a dose, take it as soon as you can. If it is almost time for your next dose, take only that dose. Do not take double or extra doses. What may interact with this medicine? Do not take this medicine with any of the following medications: -astemizole -certain medicines for irregular heart beat like dofetilide, dronedarone, quinidine -  cisapride -erythromycin -lomitapide -other medicines that prolong the QT interval (cause an abnormal heart rhythm) -pimozide -terfenadine -thioridazine -tolvaptan -ziprasidone This medicine may also interact with the following medications: -antiviral medicines for HIV or AIDS -birth control pills -certain antibiotics like rifabutin, rifampin -certain medicines for blood pressure like amlodipine, isradipine, felodipine, hydrochlorothiazide, losartan, nifedipine -certain medicines for cancer like cyclophosphamide, vinblastine, vincristine -certain medicines for cholesterol like atorvastatin, lovastatin, fluvastatin, simvastatin -certain medicines for depression, anxiety, or psychotic disturbances like amitriptyline, midazolam, nortriptyline, triazolam -certain medicines for diabetes like glipizide, glyburide, tolbutamide -certain medicines for pain like alfentanil, fentanyl, methadone -certain medicines for seizures like carbamazepine, phenytoin -certain medicines that treat or prevent blood clots like warfarin -halofantrine -medicines that lower your chance of fighting infection like cyclosporine, prednisone, tacrolimus -NSAIDS, medicines for pain and  inflammation, like celecoxib, diclofenac, flurbiprofen, ibuprofen, meloxicam, naproxen -other medicines for fungal infections -sirolimus -theophylline -tofacitinib This list may not describe all possible interactions. Give your health care provider a list of all the medicines, herbs, non-prescription drugs, or dietary supplements you use. Also tell them if you smoke, drink alcohol, or use illegal drugs. Some items may interact with your medicine. What should I watch for while using this medicine? Visit your doctor or health care professional for regular checkups. If you are taking this medicine for a long time you may need blood work. Tell your doctor if your symptoms do not improve. Some fungal infections need many weeks or months of treatment to cure. Alcohol can increase possible damage to your liver. Avoid alcoholic drinks. If you have a vaginal infection, do not have sex until you have finished your treatment. You can wear a sanitary napkin. Do not use tampons. Wear freshly washed cotton, not synthetic, panties. What side effects may I notice from receiving this medicine? Side effects that you should report to your doctor or health care professional as soon as possible: -allergic reactions like skin rash or itching, hives, swelling of the lips, mouth, tongue, or throat -dark urine -feeling dizzy or faint -irregular heartbeat or chest pain -redness, blistering, peeling or loosening of the skin, including inside the mouth -trouble breathing -unusual bruising or bleeding -vomiting -yellowing of the eyes or skin Side effects that usually do not require medical attention (report to your doctor or health care professional if they continue or are bothersome): -changes in how food tastes -diarrhea -headache -stomach upset or nausea This list may not describe all possible side effects. Call your doctor for medical advice about side effects. You may report side effects to FDA at 1-800-FDA-1088.  Where should I keep my medicine? Keep out of the reach of children. Store at room temperature below 30 degrees C (86 degrees F). Throw away any medicine after the expiration date. NOTE: This sheet is a summary. It may not cover all possible information. If you have questions about this medicine, talk to your doctor, pharmacist, or health care provider.  2019 Elsevier/Gold Standard (2012-08-05 19:37:38)  Vaginal Yeast infection, Adult  Vaginal yeast infection is a condition that causes vaginal discharge as well as soreness, swelling, and redness (inflammation) of the vagina. This is a common condition. Some women get this infection frequently. What are the causes? This condition is caused by a change in the normal balance of the yeast (candida) and bacteria that live in the vagina. This change causes an overgrowth of yeast, which causes the inflammation. What increases the risk? The condition is more likely to develop in women who:  Take antibiotic medicines.  Have diabetes.  Take birth control pills.  Are pregnant.  Douche often.  Have a weak body defense system (immune system).  Have been taking steroid medicines for a long time.  Frequently wear tight clothing. What are the signs or symptoms? Symptoms of this condition include:  White, thick, creamy vaginal discharge.  Swelling, itching, redness, and irritation of the vagina. The lips of the vagina (vulva) may be affected as well.  Pain or a burning feeling while urinating.  Pain during sex. How is this diagnosed? This condition is diagnosed based on:  Your medical history.  A physical exam.  A pelvic exam. Your health care provider will examine a sample of your vaginal discharge under a microscope. Your health care provider may send this sample for testing to confirm the diagnosis. How is this treated? This condition is treated with medicine. Medicines may be over-the-counter or prescription. You may be told to  use one or more of the following:  Medicine that is taken by mouth (orally).  Medicine that is applied as a cream (topically).  Medicine that is inserted directly into the vagina (suppository). Follow these instructions at home:  Lifestyle  Do not have sex until your health care provider approves. Tell your sex partner that you have a yeast infection. That person should go to his or her health care provider and ask if they should also be treated.  Do not wear tight clothes, such as pantyhose or tight pants.  Wear breathable cotton underwear. General instructions  Take or apply over-the-counter and prescription medicines only as told by your health care provider.  Eat more yogurt. This may help to keep your yeast infection from returning.  Do not use tampons until your health care provider approves.  Try taking a sitz bath to help with discomfort. This is a warm water bath that is taken while you are sitting down. The water should only come up to your hips and should cover your buttocks. Do this 3-4 times per day or as told by your health care provider.  Do not douche.  If you have diabetes, keep your blood sugar levels under control.  Keep all follow-up visits as told by your health care provider. This is important. Contact a health care provider if:  You have a fever.  Your symptoms go away and then return.  Your symptoms do not get better with treatment.  Your symptoms get worse.  You have new symptoms.  You develop blisters in or around your vagina.  You have blood coming from your vagina and it is not your menstrual period.  You develop pain in your abdomen. Summary  Vaginal yeast infection is a condition that causes discharge as well as soreness, swelling, and redness (inflammation) of the vagina.  This condition is treated with medicine. Medicines may be over-the-counter or prescription.  Take or apply over-the-counter and prescription medicines only as  told by your health care provider.  Do not douche. Do not have sex or use tampons until your health care provider approves.  Contact a health care provider if your symptoms do not get better with treatment or your symptoms go away and then return. This information is not intended to replace advice given to you by your health care provider. Make sure you discuss any questions you have with your health care provider. Document Released: 10/07/2004 Document Revised: 05/16/2017 Document Reviewed: 05/16/2017 Elsevier Interactive Patient Education  2019 [employer].

## 2018-03-28 NOTE — Progress Notes (Signed)
Patient [facility]   Established Patient Office Visit  Subjective:  Patient ID: Cynthia Patton, female    DOB: 1960/03/06  Age: 58 y.o. MRN: [id number]  CC:  Chief Complaint  Patient presents with  . Bronchitis  . Vaginal Discharge  . Vaginal Itching    HPI Cynthia Patton is a 58 year old female who presents for Follow Up today.   Past Medical History:  Diagnosis Date  . Diabetes mellitus without complication ([place])   . Hypertension   . Thyroid disease     Current Status: Since her last office visit, she states that she is having wheezing, cough, and mucus production X 3 days, which she believes is r/t her history of Bronchitis. She has been using has been Albuterol inhaler, and OTC Breath Easy respiratory tea for relief of congestion. She states that she vaginal itching and discharge X 4 days now. She has not used any OTC treatments. She has had changes to 'mole' located on he right upper thigh. She denies pain, itching, or swelling reported. She has noticed that mole has began to change in color and is now growing in size.   She denies fevers, chills, fatigue, recent infections, weight loss, and night sweats. She has not had any headaches, visual changes, dizziness, and falls. No chest pain, heart palpitations, cough and shortness of breath reported. No reports of GI problems such as nausea, vomiting, diarrhea, and constipation. She has no reports of blood in stools, dysuria and hematuria. No depression or anxiety today. She denies pain today.   Past Surgical History:  Procedure Laterality Date  . ABDOMINAL HYSTERECTOMY    . CHOLECYSTECTOMY      No family history on file.  Social History   Socioeconomic History  . Marital status: Single    Spouse name: Not on file  . Number of children: Not on file  . Years of education: Not on file  . Highest education level: Not on file  Occupational History  . Not on file  Social Needs  .  Financial resource strain: Not on file  . Food insecurity:    Worry: Not on file    Inability: Not on file  . Transportation needs:    Medical: Not on file    Non-medical: Not on file  Tobacco Use  . Smoking status: Current Every Day Smoker    Packs/day: 0.50    Types: Cigarettes  . Smokeless tobacco: Never Used  Substance and Sexual Activity  . Alcohol use: No  . Drug use: No  . Sexual activity: Not on file  Lifestyle  . Physical activity:    Days per week: Not on file    Minutes per session: Not on file  . Stress: Not on file  Relationships  . Social connections:    Talks on phone: Not on file    Gets together: Not on file    Attends religious service: Not on file    Active member of club or organization: Not on file    Attends meetings of clubs or organizations: Not on file    Relationship status: Not on file  . Intimate partner violence:    Fear of current or ex partner: Not on file    Emotionally abused: Not on file    Physically abused: Not on file    Forced sexual activity: Not on file  Other Topics Concern  . Not on file  Social History Narrative  .  Not on file    Outpatient Medications Prior to Visit  Medication Sig Dispense Refill  . albuterol (PROVENTIL HFA;VENTOLIN HFA) 108 (90 Base) MCG/ACT inhaler Inhale 2 puffs into the lungs every 4 (four) hours as needed for wheezing or shortness of breath. 1 Inhaler 1  . aspirin EC 81 MG tablet Take 1 tablet (81 mg total) by mouth daily. 30 tablet 11  . diltiazem (DILACOR XR) 180 MG 24 hr capsule Take 1 capsule (180 mg total) by mouth daily. 30 capsule 0  . levothyroxine (SYNTHROID, LEVOTHROID) 150 MCG tablet Take 1 tablet (150 mcg total) by mouth daily before breakfast. 30 tablet 2  . metFORMIN (GLUCOPHAGE) 500 MG tablet Take by mouth 2 (two) times daily with a meal.    . mometasone (NASONEX) 50 MCG/ACT nasal spray Place 2 sprays into the nose daily. 17 g 1   No facility-administered medications prior to visit.      Allergies  Allergen Reactions  . Floxin [Ocuflox] Other (See Comments)    Disoriented  . Percocet [Oxycodone-Acetaminophen]     ROS Review of Systems  Constitutional: Negative.   HENT: Negative.   Eyes: Negative.   Respiratory: Positive for cough and wheezing.   Cardiovascular: Negative.   Gastrointestinal: Positive for abdominal distention (obese).  Endocrine: Negative.   Genitourinary: Negative.   Musculoskeletal: Positive for arthralgias (generalized).  Skin: Negative.   Neurological: Positive for dizziness and headaches.  Hematological: Negative.   Psychiatric/Behavioral: Negative.     Objective:    Physical Exam  Constitutional: She is oriented to person, place, and time. She appears well-developed and well-nourished.  HENT:  Head: Normocephalic and atraumatic.  Eyes: Conjunctivae are normal.  Neck: Normal range of motion. Neck supple.  Cardiovascular: Normal rate, regular rhythm, normal heart sounds and intact distal pulses.  Pulmonary/Chest: Effort normal and breath sounds normal.  Abdominal: Soft. Bowel sounds are normal. She exhibits distension (Obese).  Musculoskeletal: Normal range of motion.  Neurological: She is alert and oriented to person, place, and time. She has normal reflexes.  Skin: Skin is warm and dry.  Psychiatric: She has a normal mood and affect. Her behavior is normal. Judgment and thought content normal.  Nursing note and vitals reviewed.   BP 140/76 (BP Location: Right Arm, Patient Position: Sitting, Cuff Size: Patton)   Pulse 95   Temp 98.1 F (36.7 C) (Oral)   Ht 5\' 3"  (1.6 m)   Wt 227 lb (103 kg)   SpO2 99%   BMI 40.21 kg/m  Wt Readings from Last 3 Encounters:  03/28/18 227 lb (103 kg)  03/03/18 228 lb (103.4 kg)  02/16/18 229 lb (103.9 kg)     There are no preventive care reminders to display for this patient.  There are no preventive care reminders to display for this patient.  Lab Results  Component Value Date   TSH  3.000 08/10/2017   Lab Results  Component Value Date   WBC 8.2 07/08/2017   HGB 13.6 07/08/2017   HCT 42.9 07/08/2017   MCV 94.5 07/08/2017   PLT 315 07/08/2017   Lab Results  Component Value Date   NA 146 (H) 10/17/2017   K 3.8 10/17/2017   CO2 24 10/17/2017   GLUCOSE 79 10/17/2017   BUN 7 10/17/2017   CREATININE 0.97 10/17/2017   BILITOT 0.4 07/01/2016   ALKPHOS 90 07/01/2016   AST 11 07/01/2016   ALT 9 07/01/2016   PROT 6.8 07/01/2016   ALBUMIN 3.7 07/01/2016   CALCIUM  9.0 10/17/2017   ANIONGAP 9 07/08/2017   Lab Results  Component Value Date   CHOL 187 02/09/2016   Lab Results  Component Value Date   HDL 32 (L) 02/09/2016   Lab Results  Component Value Date   LDLCALC 112 (H) 02/09/2016   Lab Results  Component Value Date   TRIG 215 (H) 02/09/2016   Lab Results  Component Value Date   CHOLHDL 5.8 (H) 02/09/2016   Lab Results  Component Value Date   HGBA1C 8.5 (A) 03/03/2018    Assessment & Plan:   1. Vaginal itching Results are pending - POCT urinalysis dipstick - NuSwab Vaginitis Plus (VG+)  2. Vaginal discharge - POCT urinalysis dipstick - NuSwab Vaginitis Plus (VG+)  3. Bronchitis We will initiate Xyzal today. She will continue Nasonex and Albuterol as needed for relief of symptoms. She will increase water intake. She refuses to use Mucinex.  - levocetirizine (XYZAL) 5 MG tablet; Take 1 tablet (5 mg total) by mouth every evening.  Dispense: 30 tablet; Refill: 3  4. Seasonal allergies - levocetirizine (XYZAL) 5 MG tablet; Take 1 tablet (5 mg total) by mouth every evening.  Dispense: 30 tablet; Refill: 3  5. Candidiasis We will initiate Fluconazole today.  - fluconazole (DIFLUCAN) 150 MG tablet; Take 1 tablet (150 mg total) by mouth once for 1 dose.  Dispense: 1 tablet; Refill: 0  6. Other skin changes   Right upper leg mole changes.  - Ambulatory referral to Dermatology  7. Controlled type 2 diabetes mellitus with complication,  without long-term current use of insulin (HCC) Blood glucose is elevated at 200 today. Continue medications as prescribed.  - POCT glucose (manual entry)  8. Abnormal urinalysis Results are pending. - Urine Culture  9. Follow up She will follow up in 3 months.     Problem List Items Addressed This Visit      Endocrine   Controlled type 2 diabetes mellitus with complication, without long-term current use of insulin (HCC)   Relevant Orders   POCT glucose (manual entry) (Completed)    Other Visit Diagnoses    Vaginal itching    -  Primary   Relevant Orders   POCT urinalysis dipstick (Completed)   NuSwab Vaginitis Plus (VG+)   Vaginal discharge       Relevant Orders   POCT urinalysis dipstick (Completed)   NuSwab Vaginitis Plus (VG+)   Bronchitis       Relevant Medications   levocetirizine (XYZAL) 5 MG tablet   Seasonal allergies       Relevant Medications   levocetirizine (XYZAL) 5 MG tablet   Candidiasis       Relevant Medications   fluconazole (DIFLUCAN) 150 MG tablet   Other skin changes       Relevant Orders   Ambulatory referral to Dermatology   Abnormal urinalysis       Relevant Orders   Urine Culture   Follow up          Meds ordered this encounter  Medications  . levocetirizine (XYZAL) 5 MG tablet    Sig: Take 1 tablet (5 mg total) by mouth every evening.    Dispense:  30 tablet    Refill:  3  . fluconazole (DIFLUCAN) 150 MG tablet    Sig: Take 1 tablet (150 mg total) by mouth once for 1 dose.    Dispense:  1 tablet    Refill:  0    Follow-up: Return in about 3 months (around  06/28/2018).    Azzie Glatter, FNP

## 2018-03-30 ENCOUNTER — Telehealth: Payer: Self-pay

## 2018-03-30 LAB — URINE CULTURE

## 2018-03-31 ENCOUNTER — Other Ambulatory Visit: Payer: Self-pay | Admitting: Family Medicine

## 2018-03-31 DIAGNOSIS — B379 Candidiasis, unspecified: Secondary | ICD-10-CM

## 2018-03-31 MED ORDER — FLUCONAZOLE 150 MG PO TABS: 150.0000 mg | ORAL_TABLET | Freq: Once | ORAL | 0 refills | Status: AC

## 2018-04-01 LAB — NUSWAB VAGINITIS PLUS (VG+)
Candida albicans, NAA: NEGATIVE
Candida glabrata, NAA: NEGATIVE
Chlamydia trachomatis, NAA: NEGATIVE
Neisseria gonorrhoeae, NAA: NEGATIVE
Trich vag by NAA: NEGATIVE

## 2018-04-05 DIAGNOSIS — D2372 Other benign neoplasm of skin of left lower limb, including hip: Secondary | ICD-10-CM | POA: Diagnosis not present

## 2018-04-05 DIAGNOSIS — D2371 Other benign neoplasm of skin of right lower limb, including hip: Secondary | ICD-10-CM | POA: Diagnosis not present

## 2018-04-06 ENCOUNTER — Other Ambulatory Visit: Payer: Self-pay

## 2018-04-06 ENCOUNTER — Telehealth: Payer: Self-pay

## 2018-04-06 DIAGNOSIS — D219 Benign neoplasm of connective and other soft tissue, unspecified: Secondary | ICD-10-CM | POA: Diagnosis not present

## 2018-04-06 DIAGNOSIS — I10 Essential (primary) hypertension: Secondary | ICD-10-CM

## 2018-04-06 DIAGNOSIS — D485 Neoplasm of uncertain behavior of skin: Secondary | ICD-10-CM | POA: Diagnosis not present

## 2018-04-06 MED ORDER — DILTIAZEM HCL ER 180 MG PO CP24: 180.0000 mg | ORAL_CAPSULE | Freq: Every day | ORAL | 2 refills | Status: DC

## 2018-04-06 NOTE — Telephone Encounter (Signed)
Medication sent to pharmacy  

## 2018-04-06 NOTE — Telephone Encounter (Signed)
Patient notified

## 2018-04-27 DIAGNOSIS — D2121 Benign neoplasm of connective and other soft tissue of right lower limb, including hip: Secondary | ICD-10-CM | POA: Diagnosis not present

## 2018-05-01 NOTE — Progress Notes (Signed)
error 

## 2018-05-09 NOTE — Telephone Encounter (Signed)
Message sent to provider 

## 2018-05-12 NOTE — Telephone Encounter (Signed)
Message sent to provider 

## 2018-06-23 ENCOUNTER — Telehealth: Payer: Self-pay

## 2018-06-23 NOTE — Telephone Encounter (Signed)
Called and spoke with patient for COVID 19 Screening. Patient had no risk factors and is cleared to come into office for appointment. Thanks! 

## 2018-06-26 ENCOUNTER — Ambulatory Visit: Payer: Self-pay | Admitting: Family Medicine

## 2018-06-28 ENCOUNTER — Encounter: Payer: Self-pay | Admitting: Family Medicine

## 2018-06-28 ENCOUNTER — Other Ambulatory Visit: Payer: Self-pay

## 2018-06-28 ENCOUNTER — Ambulatory Visit (INDEPENDENT_AMBULATORY_CARE_PROVIDER_SITE_OTHER): Payer: BC Managed Care – PPO | Admitting: Family Medicine

## 2018-06-28 VITALS — BP 164/70 | HR 83 | Temp 98.5°F | Resp 16 | Ht 63.0 in | Wt 229.0 lb

## 2018-06-28 DIAGNOSIS — E118 Type 2 diabetes mellitus with unspecified complications: Secondary | ICD-10-CM | POA: Diagnosis not present

## 2018-06-28 DIAGNOSIS — I10 Essential (primary) hypertension: Secondary | ICD-10-CM

## 2018-06-28 LAB — POCT GLYCOSYLATED HEMOGLOBIN (HGB A1C): Hemoglobin A1C: 9.3 % — AB (ref 4.0–5.6)

## 2018-06-28 LAB — POCT URINALYSIS DIPSTICK
Bilirubin, UA: NEGATIVE
Glucose, UA: NEGATIVE
Ketones, UA: NEGATIVE
Leukocytes, UA: NEGATIVE
Nitrite, UA: NEGATIVE
Protein, UA: POSITIVE — AB
Spec Grav, UA: 1.02 (ref 1.010–1.025)
Urobilinogen, UA: 0.2 E.U./dL
pH, UA: 5.5 (ref 5.0–8.0)

## 2018-06-29 NOTE — Progress Notes (Signed)
Patient left after vital signs and A1C were taken.  Patient to reschedule appt.  [name], FNP-BC [facility] [address] [place], [place] [address] [phone]

## 2018-07-06 ENCOUNTER — Other Ambulatory Visit: Payer: Self-pay

## 2018-07-06 DIAGNOSIS — E038 Other specified hypothyroidism: Secondary | ICD-10-CM

## 2018-07-06 MED ORDER — LEVOTHYROXINE SODIUM 150 MCG PO TABS: 137.0000 ug | ORAL_TABLET | Freq: Every day | ORAL | 2 refills | Status: DC

## 2018-07-07 ENCOUNTER — Other Ambulatory Visit: Payer: Self-pay

## 2018-07-07 ENCOUNTER — Ambulatory Visit (INDEPENDENT_AMBULATORY_CARE_PROVIDER_SITE_OTHER): Payer: BC Managed Care – PPO | Admitting: Family Medicine

## 2018-07-07 VITALS — BP 168/74 | HR 85 | Temp 98.7°F | Resp 16 | Ht 63.0 in | Wt 230.0 lb

## 2018-07-07 DIAGNOSIS — I10 Essential (primary) hypertension: Secondary | ICD-10-CM | POA: Diagnosis not present

## 2018-07-07 DIAGNOSIS — E118 Type 2 diabetes mellitus with unspecified complications: Secondary | ICD-10-CM

## 2018-07-07 DIAGNOSIS — E039 Hypothyroidism, unspecified: Secondary | ICD-10-CM | POA: Diagnosis not present

## 2018-07-07 DIAGNOSIS — E785 Hyperlipidemia, unspecified: Secondary | ICD-10-CM | POA: Diagnosis not present

## 2018-07-07 MED ORDER — BLOOD GLUCOSE MONITOR KIT: PACK | 0 refills | Status: AC

## 2018-07-07 NOTE — Patient Instructions (Signed)
Glipizide Extended-release tablets What is this medicine? GLIPIZIDE (GLIP i zide) helps to treat type 2 diabetes. It is combined with diet and exercise. The medicine helps your body to use insulin better. This medicine may be used for other purposes; ask your health care provider or pharmacist if you have questions. COMMON BRAND NAME(S): Glucotrol XL What should I tell my health care provider before I take this medicine? They need to know if you have any of these conditions: -diabetic ketoacidosis -glucose-6-phosphate dehydrogenase deficiency -heart disease -kidney disease -liver disease -porphyria -severe infection or injury -thyroid disease -an unusual or allergic reaction to glipizide, sulfa drugs, other medicines, foods, dyes, or preservatives -pregnant or trying to get pregnant -breast-feeding How should I use this medicine? Take this medicine by mouth. Follow the directions on the prescription label. Swallow the tablets with a drink of water and take with your breakfast. Take your medicine at the same time each day. Do not take more often than directed. Talk to your pediatrician regarding the use of this medicine in children. Special care may be needed. Elderly patients over 11 years old may have a stronger reaction and need a smaller dose. Overdosage: If you think you have taken too much of this medicine contact a poison control center or emergency room at once. NOTE: This medicine is only for you. Do not share this medicine with others. What if I miss a dose? If you miss a dose, take it as soon as you can. If it is almost time for your next dose, take only that dose. Do not take double or extra doses. What may interact with this medicine? -bosentan -chloramphenicol -cisapride -medicines for fungal or yeast infections -metoclopramide -probenecid -warfarin Many medications may cause an increase or decrease in blood sugar, these include: -alcohol containing beverages -aspirin  and aspirin-like drugs -chloramphenicol -chromium -clarithromycin -female hormones, like estrogens or progestins and birth control pills -heart medicines -isoniazid -female hormones or anabolic steroids -medicines for weight loss -medicines for allergies, asthma, cold, or cough -medicines for mental problems -medicines called MAO Inhibitors like Nardil, Parnate, Marplan, Eldepryl -niacin -NSAIDs, medicines for pain and inflammation, like ibuprofen or naproxen -pentamidine -phenytoin -probenecid -quinolone antibiotics like ciprofloxacin, levofloxacin, ofloxacin -some herbal dietary supplements -steroid medicines like prednisone or cortisone -thyroid medicine -water pills or diuretics This list may not describe all possible interactions. Give your health care provider a list of all the medicines, herbs, non-prescription drugs, or dietary supplements you use. Also tell them if you smoke, drink alcohol, or use illegal drugs. Some items may interact with your medicine. What should I watch for while using this medicine? Visit your doctor or health care professional for regular checks on your progress. A test called the HbA1C (A1C) will be monitored. This is a simple blood test. It measures your blood sugar control over the last 2 to 3 months. You will receive this test every 3 to 6 months. Learn how to check your blood sugar. Learn the symptoms of low and high blood sugar and how to manage them. Always carry a quick-source of sugar with you in case you have symptoms of low blood sugar. Examples include hard sugar candy or glucose tablets. Make sure others know that you can choke if you eat or drink when you develop serious symptoms of low blood sugar, such as seizures or unconsciousness. They must get medical help at once. Tell your doctor or health care professional if you have high blood sugar. You might need to change the  dose of your medicine. If you are sick or exercising more than usual,  you might need to change the dose of your medicine. Do not skip meals. Ask your doctor or health care professional if you should avoid alcohol. Many nonprescription cough and cold products contain sugar or alcohol. These can affect blood sugar. This medicine can make you more sensitive to the sun. Keep out of the sun. If you cannot avoid being in the sun, wear protective clothing and use sunscreen. Do not use sun lamps or tanning beds/booths. Wear a medical ID bracelet or chain, and carry a card that describes your disease and details of your medicine and dosage times. What side effects may I notice from receiving this medicine? Side effects that you should report to your doctor or health care professional as soon as possible: -allergic reactions like skin rash, itching or hives, swelling of the face, lips, or tongue -breathing problems -dark urine -fever, chills, sore throat -signs and symptoms of low blood sugar such as feeling anxious, confusion, dizziness, increased hunger, unusually weak or tired, sweating, shakiness, cold, irritable, headache, blurred vision, fast heartbeat, loss of consciousness -unusual bleeding or bruising -yellowing of the eyes or skin Side effects that usually do not require medical attention (report to your doctor or health care professional if they continue or are bothersome): -diarrhea -dizziness -headache -heartburn -nausea -stomach gas This list may not describe all possible side effects. Call your doctor for medical advice about side effects. You may report side effects to FDA at 1-800-FDA-1088. Where should I keep my medicine? Keep out of the reach of children. Store at room temperature between 15 to 30 degrees C (59 to 86 degrees F). Protect from moisture and humidity. Throw away any unused medicine after the expiration date. NOTE: This sheet is a summary. It may not cover all possible information. If you have questions about this medicine, talk to your  doctor, pharmacist, or health care provider.  2019 Elsevier/Gold Standard (2012-04-12 14:32:16)

## 2018-07-07 NOTE — Progress Notes (Signed)
  Patient [facility]   Progress Note: General Provider: Lanae Boast, FNP  SUBJECTIVE:   Cynthia Patton is a 58 y.o. female who  has a past medical history of Diabetes mellitus without complication ([place]), Hypertension, and Thyroid disease.. Patient presents today for Hypertension and Diabetes Patient states that she is concerned that she is not on an optimal dose of levothyroxine. She reports difficulty with losing weight and fatigue. Patient reports compliance with all medications except prior to this visit.  Patient denies side effects of medications.  She reports non-compliance with diet and exercise. No reports of glucose monitoring due to not having a glucometer.    Review of Systems  Constitutional: Positive for malaise/fatigue.  HENT: Negative.   Eyes: Negative.   Respiratory: Negative.   Cardiovascular: Negative.   Gastrointestinal: Negative.   Genitourinary: Negative.   Musculoskeletal: Negative.   Skin: Negative.   Neurological: Negative.   Endo/Heme/Allergies:       Difficulty with weight loss.  fatigue  Psychiatric/Behavioral: Negative.      OBJECTIVE: BP (!) 168/74 (BP Location: Left Arm, Patient Position: Sitting, Cuff Size: Normal)   Pulse 85   Temp 98.7 F (37.1 C) (Oral)   Resp 16   Ht _0  (1.6 m)   Wt 230 lb (104.3 kg)   SpO2 100%   BMI 40.74 kg/m   Wt Readings from Last 3 Encounters:  07/07/18 230 lb (104.3 kg)  06/28/18 229 lb (103.9 kg)  03/28/18 227 lb (103 kg)     Physical Exam  ASSESSMENT/PLAN:   1. Controlled type 2 diabetes mellitus with complication, without long-term current use of insulin (HCC) - blood glucose meter kit and supplies KIT; Dispense based on patient and insurance preference. Use up to four times daily as directed. (FOR ICD-9 250.00, 250.01).  Dispense: 1 each; Refill: 0 - Comprehensive metabolic panel; Future  2. Essential hypertension - CBC With Differential; Future  3.  Hyperlipidemia, unspecified hyperlipidemia type - Lipid Panel With LDL/HDL Ratio; Future  4. Acquired hypothyroidism - TSH; Future   Return in about 3 months (around 10/07/2018) for DM.    The patient was given clear instructions to go to ER or return to medical center if symptoms do not improve, worsen or new problems develop. The patient verbalized understanding and agreed with plan of care.   [name], FNP-BC [facility] [address] [place], [place] [address] [phone]

## 2018-07-13 ENCOUNTER — Other Ambulatory Visit: Payer: Self-pay

## 2018-07-13 DIAGNOSIS — E039 Hypothyroidism, unspecified: Secondary | ICD-10-CM | POA: Diagnosis not present

## 2018-07-13 DIAGNOSIS — E118 Type 2 diabetes mellitus with unspecified complications: Secondary | ICD-10-CM

## 2018-07-13 DIAGNOSIS — E785 Hyperlipidemia, unspecified: Secondary | ICD-10-CM | POA: Diagnosis not present

## 2018-07-13 DIAGNOSIS — I10 Essential (primary) hypertension: Secondary | ICD-10-CM | POA: Diagnosis not present

## 2018-07-14 LAB — CBC WITH DIFFERENTIAL
Basophils Absolute: 0 10*3/uL (ref 0.0–0.2)
Basos: 1 %
EOS (ABSOLUTE): 0.1 10*3/uL (ref 0.0–0.4)
Eos: 1 %
Hematocrit: 40.2 % (ref 34.0–46.6)
Hemoglobin: 13 g/dL (ref 11.1–15.9)
Immature Grans (Abs): 0 10*3/uL (ref 0.0–0.1)
Immature Granulocytes: 0 %
Lymphocytes Absolute: 2.1 10*3/uL (ref 0.7–3.1)
Lymphs: 25 %
MCH: 30.4 pg (ref 26.6–33.0)
MCHC: 32.3 g/dL (ref 31.5–35.7)
MCV: 94 fL (ref 79–97)
Monocytes Absolute: 0.4 10*3/uL (ref 0.1–0.9)
Monocytes: 5 %
Neutrophils Absolute: 5.8 10*3/uL (ref 1.4–7.0)
Neutrophils: 68 %
RBC: 4.28 x10E6/uL (ref 3.77–5.28)
RDW: 11.9 % (ref 11.7–15.4)
WBC: 8.4 10*3/uL (ref 3.4–10.8)

## 2018-07-14 LAB — COMPREHENSIVE METABOLIC PANEL WITH GFR
ALT: 8 [IU]/L (ref 0–32)
AST: 6 [IU]/L (ref 0–40)
Albumin/Globulin Ratio: 1.8 (ref 1.2–2.2)
Albumin: 4.4 g/dL (ref 3.8–4.9)
Alkaline Phosphatase: 82 [IU]/L (ref 39–117)
BUN/Creatinine Ratio: 13 (ref 9–23)
BUN: 13 mg/dL (ref 6–24)
Bilirubin Total: 0.2 mg/dL (ref 0.0–1.2)
CO2: 21 mmol/L (ref 20–29)
Calcium: 9.1 mg/dL (ref 8.7–10.2)
Chloride: 104 mmol/L (ref 96–106)
Creatinine, Ser: 0.98 mg/dL (ref 0.57–1.00)
GFR calc Af Amer: 73 mL/min/{1.73_m2}
GFR calc non Af Amer: 63 mL/min/{1.73_m2}
Globulin, Total: 2.5 g/dL (ref 1.5–4.5)
Glucose: 170 mg/dL — ABNORMAL HIGH (ref 65–99)
Potassium: 4 mmol/L (ref 3.5–5.2)
Sodium: 141 mmol/L (ref 134–144)
Total Protein: 6.9 g/dL (ref 6.0–8.5)

## 2018-07-14 LAB — LIPID PANEL WITH LDL/HDL RATIO
Cholesterol, Total: 182 mg/dL (ref 100–199)
HDL: 42 mg/dL
LDL Calculated: 118 mg/dL — ABNORMAL HIGH (ref 0–99)
LDl/HDL Ratio: 2.8 ratio (ref 0.0–3.2)
Triglycerides: 110 mg/dL (ref 0–149)
VLDL Cholesterol Cal: 22 mg/dL (ref 5–40)

## 2018-07-14 LAB — TSH: TSH: 14.28 u[IU]/mL — ABNORMAL HIGH (ref 0.450–4.500)

## 2018-07-17 MED ORDER — LEVOTHYROXINE SODIUM 175 MCG PO TABS: 175.0000 ug | ORAL_TABLET | Freq: Every day | ORAL | 0 refills | Status: DC

## 2018-07-17 NOTE — Addendum Note (Signed)
Addended by: Genelle Bal on: 07/17/2018 11:01 PM   Modules accepted: Orders

## 2018-07-18 ENCOUNTER — Telehealth: Payer: Self-pay

## 2018-07-18 NOTE — Telephone Encounter (Signed)
Patient called back and I informed that thyroid levels were high and that we are increasing her levothyroxine to 129mcg daily. Asked that she come in for lab draw to recheck in 6 to 8 weeks. She verbalized understanding and states she wants to call back to schedule lab appointment at a later date. Thanks!

## 2018-07-18 NOTE — Telephone Encounter (Signed)
Called, no answer. Left a message for patient to call back. Thanks!  

## 2018-07-18 NOTE — Telephone Encounter (Signed)
-----   Message from Lanae Boast, [place] sent at 07/17/2018 11:01 PM EDT ----- Please let the patient know that her thyroid levels were high. I am increasing the levothyroxine to 192mcgs daily. She will need to return for a lab appt in 6-8 weeks to make sure the medication is at the appropriate dose.

## 2018-07-19 ENCOUNTER — Encounter: Payer: Self-pay | Admitting: Family Medicine

## 2018-07-19 ENCOUNTER — Other Ambulatory Visit: Payer: Self-pay

## 2018-07-19 DIAGNOSIS — I10 Essential (primary) hypertension: Secondary | ICD-10-CM

## 2018-07-19 MED ORDER — DILTIAZEM HCL ER 180 MG PO CP24: 180.0000 mg | ORAL_CAPSULE | Freq: Every day | ORAL | 2 refills | Status: DC

## 2018-08-03 IMAGING — CR DG CHEST 2V
2 series · 2 of 2 positions shown · non-contrast
Comparison: 01/21/2014

CLINICAL DATA: Shortness of breath with wheezing

EXAM:
CHEST  2 VIEW

[w chest pa *]
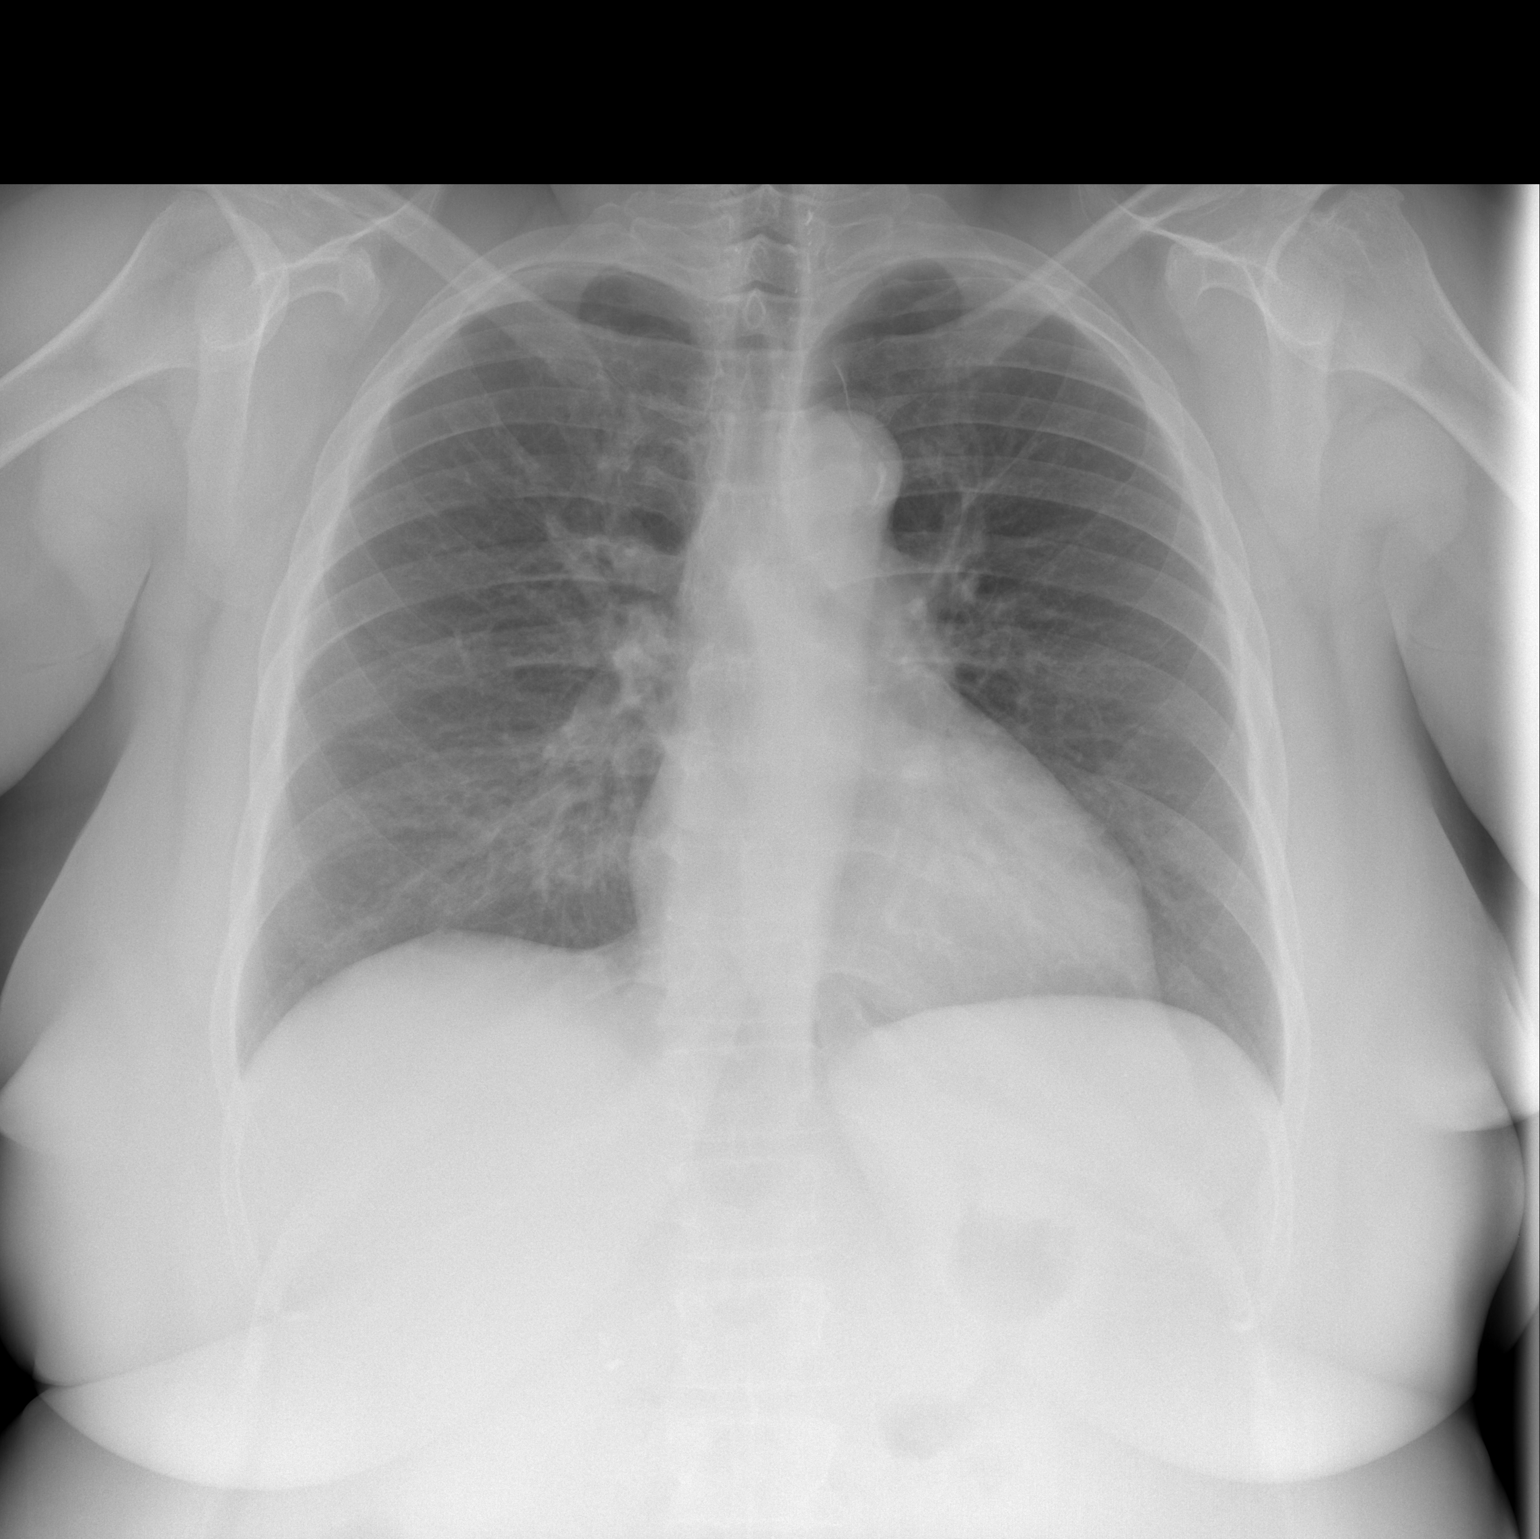

[w chest lat]
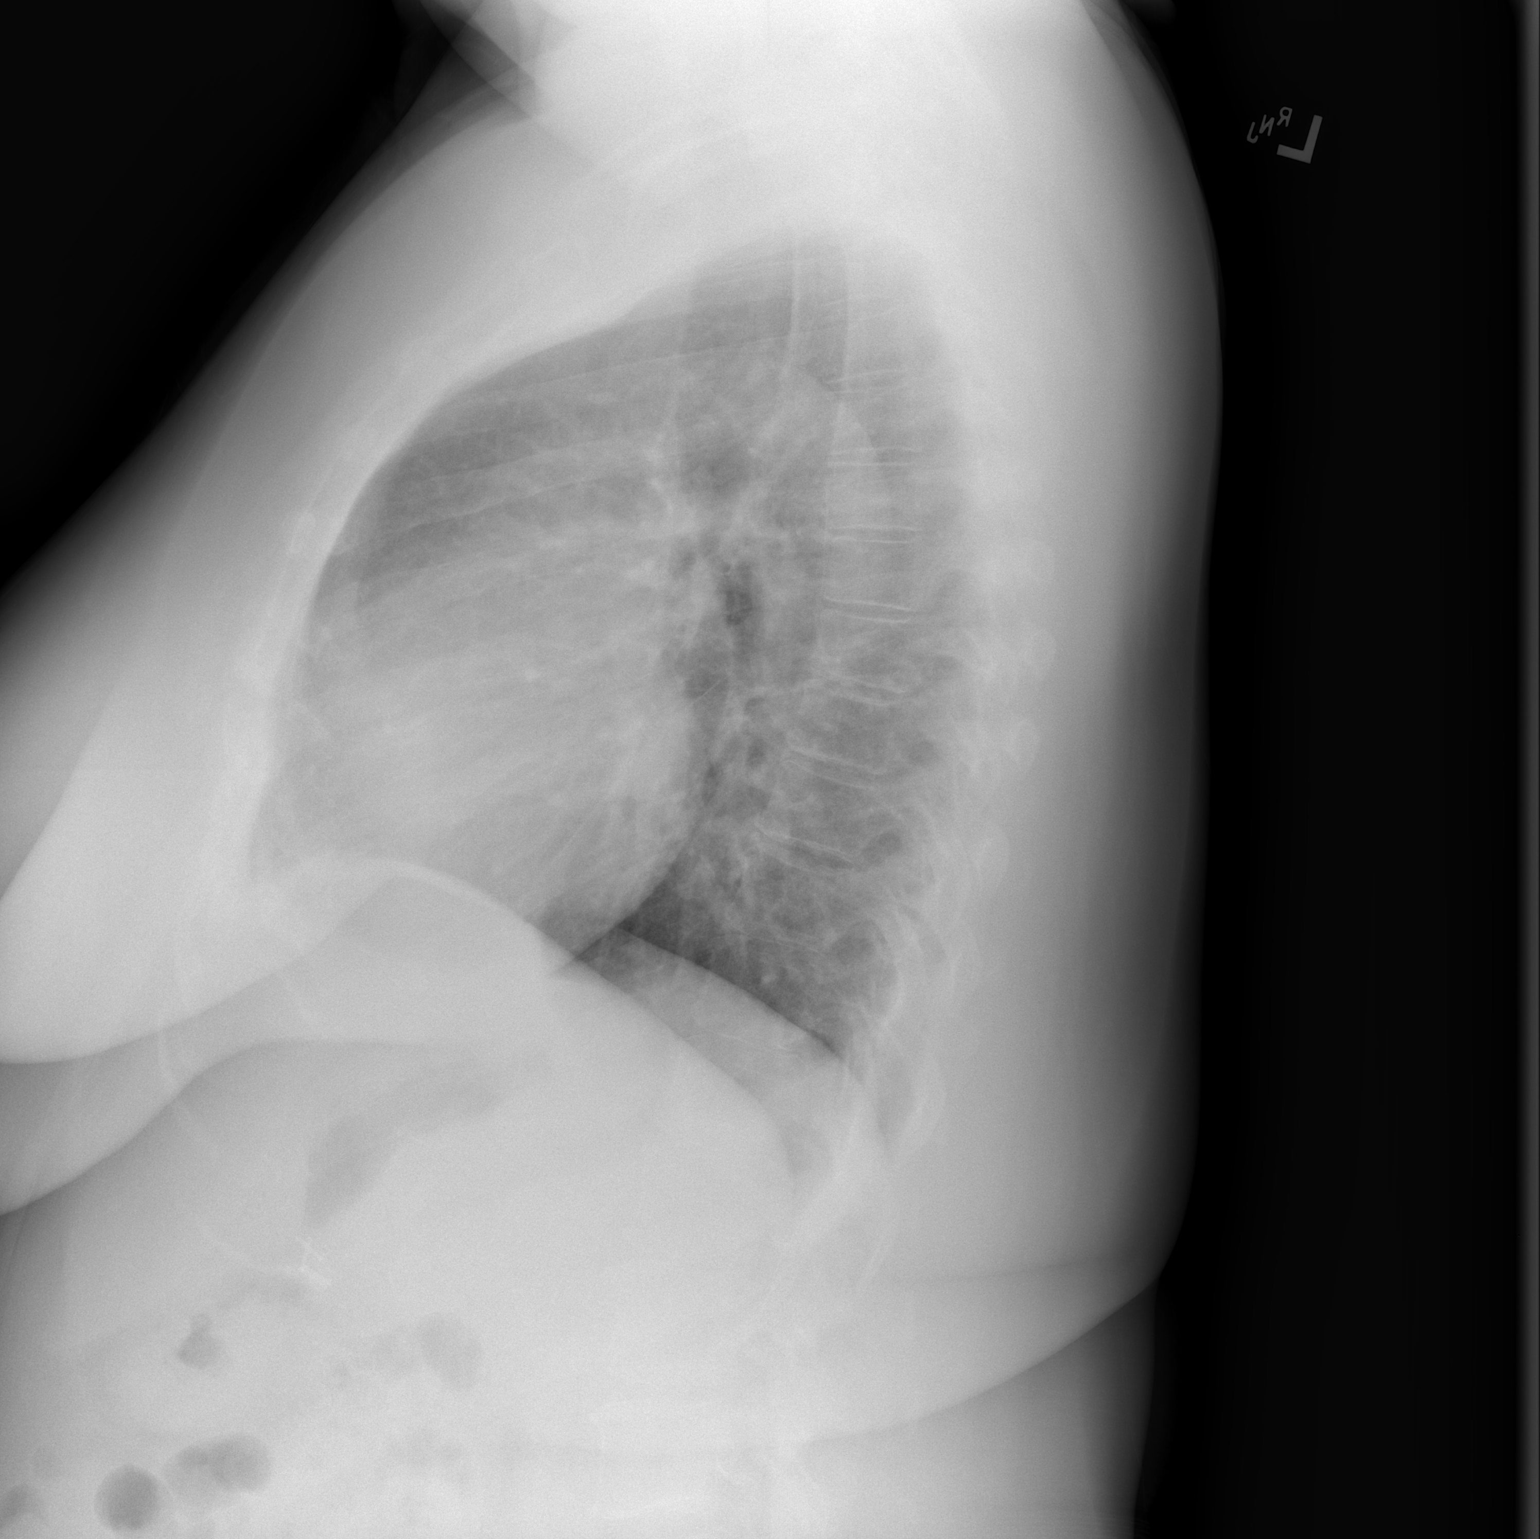

[2 of 2 positions shown; findings below may reference images not displayed]

FINDINGS: Prominent left ventricular size in the lateral projection. Aortic
arch atherosclerotic calcification. Clips at the thoracic inlet,
question thyroidectomy in this patient with chart history of thyroid
disease. There is no edema, consolidation, effusion, or
pneumothorax.
IMPRESSION: 1. No acute finding.
2. Prominent left ventricular size.

## 2018-09-20 ENCOUNTER — Encounter (HOSPITAL_COMMUNITY): Payer: Self-pay | Admitting: *Deleted

## 2018-09-20 ENCOUNTER — Encounter (HOSPITAL_COMMUNITY): Payer: Self-pay

## 2018-10-09 ENCOUNTER — Ambulatory Visit: Payer: Self-pay | Admitting: Family Medicine

## 2018-10-14 DIAGNOSIS — Z20828 Contact with and (suspected) exposure to other viral communicable diseases: Secondary | ICD-10-CM | POA: Diagnosis not present

## 2018-10-14 DIAGNOSIS — R05 Cough: Secondary | ICD-10-CM | POA: Diagnosis not present

## 2018-10-16 DIAGNOSIS — Z20828 Contact with and (suspected) exposure to other viral communicable diseases: Secondary | ICD-10-CM | POA: Diagnosis not present

## 2018-10-17 ENCOUNTER — Encounter: Payer: Self-pay | Admitting: Family Medicine

## 2018-10-17 ENCOUNTER — Ambulatory Visit (INDEPENDENT_AMBULATORY_CARE_PROVIDER_SITE_OTHER): Payer: BC Managed Care – PPO | Admitting: Family Medicine

## 2018-10-17 ENCOUNTER — Other Ambulatory Visit: Payer: Self-pay

## 2018-10-17 VITALS — BP 159/58 | HR 82 | Temp 97.9°F | Ht 63.0 in | Wt 229.4 lb

## 2018-10-17 DIAGNOSIS — R7303 Prediabetes: Secondary | ICD-10-CM

## 2018-10-17 DIAGNOSIS — E118 Type 2 diabetes mellitus with unspecified complications: Secondary | ICD-10-CM | POA: Diagnosis not present

## 2018-10-17 DIAGNOSIS — Z09 Encounter for follow-up examination after completed treatment for conditions other than malignant neoplasm: Secondary | ICD-10-CM

## 2018-10-17 DIAGNOSIS — I10 Essential (primary) hypertension: Secondary | ICD-10-CM | POA: Diagnosis not present

## 2018-10-17 DIAGNOSIS — E785 Hyperlipidemia, unspecified: Secondary | ICD-10-CM

## 2018-10-17 DIAGNOSIS — E119 Type 2 diabetes mellitus without complications: Secondary | ICD-10-CM

## 2018-10-17 LAB — POCT URINALYSIS DIPSTICK
Bilirubin, UA: NEGATIVE
Glucose, UA: POSITIVE — AB
Leukocytes, UA: NEGATIVE
Nitrite, UA: NEGATIVE
Protein, UA: POSITIVE — AB
Spec Grav, UA: 1.02 (ref 1.010–1.025)
Urobilinogen, UA: 1 E.U./dL
pH, UA: 5.5 (ref 5.0–8.0)

## 2018-10-17 LAB — POCT GLYCOSYLATED HEMOGLOBIN (HGB A1C): Hemoglobin A1C: 8.6 % — AB (ref 4.0–5.6)

## 2018-10-17 LAB — GLUCOSE, POCT (MANUAL RESULT ENTRY): POC Glucose: 223 mg/dl — AB (ref 70–99)

## 2018-10-17 MED ORDER — ONGLYZA 2.5 MG PO TABS: 2.5000 mg | ORAL_TABLET | Freq: Every day | ORAL | 3 refills | Status: DC

## 2018-10-17 NOTE — Progress Notes (Signed)
[name]   Established Patient Office Visit  Subjective:  Patient ID: Cynthia Patton, female    DOB: [birth year]  Age: 58 y.o. MRN: [id number]  CC:  Chief Complaint  Patient presents with  . Follow-up    HPI Cynthia Patton is a 58 year old female who presents for Follow Up today.  Past Medical History:  Diagnosis Date  . Diabetes mellitus without complication ([place])   . Hypertension   . Thyroid disease    Current Status: This will be [name]'s initial office visit with me. She was previously seeing Lanae Boast, NP for her PCP needs. Since her last office visit, she is doing well with no complaints. She states that she has been having stomach pain, which is relieved by eating X 1 week now. She denies visual changes, chest pain, cough, shortness of breath, heart palpitations, and falls. She has occasional headaches and dizziness with position changes. Denies severe headaches, confusion, seizures, double vision, and blurred vision, nausea and vomiting. She denies fatigue, frequent urination, blurred vision, excessive hunger, excessive thirst, weight gain, weight loss, and poor wound healing. She continues to check her feet regularly. She is not pleased with side effects of diarrhea from Metformin. She denies fevers, chills, recent infections, weight loss, and night sweats. No reports of GI problems such as constipation. She has no reports of blood in stools, dysuria and hematuria. No depression or anxiety reported. She denies pain today.   Past Surgical History:  Procedure Laterality Date  . ABDOMINAL HYSTERECTOMY    . CHOLECYSTECTOMY    . INCISE AND DRAIN ABCESS     on right leg     History reviewed. No pertinent family history.  Social History   Socioeconomic History  . Marital status: Single    Spouse name: Not on file  . Number of children: Not on file  . Years of education: Not on file  . Highest education level: Not  on file  Occupational History  . Not on file  Social Needs  . Financial resource strain: Not on file  . Food insecurity    Worry: Not on file    Inability: Not on file  . Transportation needs    Medical: Not on file    Non-medical: Not on file  Tobacco Use  . Smoking status: Current Every Day Smoker    Packs/day: 0.50    Types: Cigarettes  . Smokeless tobacco: Never Used  Substance and Sexual Activity  . Alcohol use: No  . Drug use: No  . Sexual activity: Not on file  Lifestyle  . Physical activity    Days per week: Not on file    Minutes per session: Not on file  . Stress: Not on file  Relationships  . Social Herbalist on phone: Not on file    Gets together: Not on file    Attends religious service: Not on file    Active member of club or organization: Not on file    Attends meetings of clubs or organizations: Not on file    Relationship status: Not on file  . Intimate partner violence    Fear of current or ex partner: Not on file    Emotionally abused: Not on file    Physically abused: Not on file    Forced sexual activity: Not on file  Other Topics Concern  . Not on file  Social History Narrative  .  Not on file    Outpatient Medications Prior to Visit  Medication Sig Dispense Refill  . albuterol (PROVENTIL HFA;VENTOLIN HFA) 108 (90 Base) MCG/ACT inhaler Inhale 2 puffs into the lungs every 4 (four) hours as needed for wheezing or shortness of breath. 1 Inhaler 1  . aspirin EC 81 MG tablet Take 1 tablet (81 mg total) by mouth daily. 30 tablet 11  . buPROPion (WELLBUTRIN XL) 150 MG 24 hr tablet Take 150 mg by mouth daily.    [place] Kitchen diltiazem (DILACOR XR) 180 MG 24 hr capsule Take 1 capsule (180 mg total) by mouth daily. 30 capsule 2  . levocetirizine (XYZAL) 5 MG tablet Take 1 tablet (5 mg total) by mouth every evening. 30 tablet 3  . levothyroxine (SYNTHROID) 175 MCG tablet Take 1 tablet (175 mcg total) by mouth daily before breakfast. 90 tablet 0  .  mometasone (NASONEX) 50 MCG/ACT nasal spray Place 2 sprays into the nose daily. 17 g 1  . blood glucose meter kit and supplies KIT Dispense based on patient and insurance preference. Use up to four times daily as directed. (FOR ICD-9 250.00, 250.01). 1 each 0  . metFORMIN (GLUCOPHAGE) 500 MG tablet Take by mouth 2 (two) times daily with a meal.     No facility-administered medications prior to visit.     Allergies  Allergen Reactions  . Floxin [Ocuflox] Other (See Comments)    Disoriented  . Percocet [Oxycodone-Acetaminophen]     ROS Review of Systems  Constitutional: Positive for fatigue.  HENT: Negative.   Eyes: Negative.   Respiratory: Negative.   Cardiovascular: Negative.   Gastrointestinal: Negative.   Endocrine: Negative.   Genitourinary: Negative.   Musculoskeletal: Negative.   Skin: Negative.   Allergic/Immunologic: Negative.   Neurological: Positive for dizziness and headaches.  Hematological: Negative.   Psychiatric/Behavioral: Negative.       Objective:    Physical Exam  Constitutional: She is oriented to person, place, and time. She appears well-developed and well-nourished.  HENT:  Head: Normocephalic and atraumatic.  Eyes: Conjunctivae are normal.  Neck: Normal range of motion. Neck supple.  Cardiovascular: Normal rate, regular rhythm, normal heart sounds and intact distal pulses.  Pulmonary/Chest: Effort normal and breath sounds normal.  Abdominal: Soft. Bowel sounds are normal.  Musculoskeletal: Normal range of motion.  Neurological: She is alert and oriented to person, place, and time. She has normal reflexes.  Skin: Skin is warm and dry.  Psychiatric: She has a normal mood and affect. Her behavior is normal. Judgment and thought content normal.  Nursing note and vitals reviewed.   BP (!) 159/58 (BP Location: Right Arm, Patient Position: Sitting, Cuff Size: Large)   Pulse 82   Temp 97.9 F (36.6 C) (Oral)   Ht '5\' 3"'  (1.6 m)   Wt 229 lb 6.4 oz  (104.1 kg)   SpO2 98%   BMI 40.64 kg/m  Wt Readings from Last 3 Encounters:  10/17/18 229 lb 6.4 oz (104.1 kg)  07/07/18 230 lb (104.3 kg)  06/28/18 229 lb (103.9 kg)     Health Maintenance Due  Topic Date Due  . OPHTHALMOLOGY EXAM  01/16/1969  . PAP SMEAR-Modifier  01/17/1980  . MAMMOGRAM  01/16/2009  . COLONOSCOPY  01/16/2009  . FOOT EXAM  07/12/2018  . URINE MICROALBUMIN  07/12/2018    There are no preventive care reminders to display for this patient.  Lab Results  Component Value Date   TSH 14.280 (H) 07/13/2018   Lab Results  Component Value Date   WBC 8.4 07/13/2018   HGB 13.0 07/13/2018   HCT 40.2 07/13/2018   MCV 94 07/13/2018   PLT 315 07/08/2017   Lab Results  Component Value Date   NA 141 07/13/2018   K 4.0 07/13/2018   CO2 21 07/13/2018   GLUCOSE 170 (H) 07/13/2018   BUN 13 07/13/2018   CREATININE 0.98 07/13/2018   BILITOT 0.2 07/13/2018   ALKPHOS 82 07/13/2018   AST 6 07/13/2018   ALT 8 07/13/2018   PROT 6.9 07/13/2018   ALBUMIN 4.4 07/13/2018   CALCIUM 9.1 07/13/2018   ANIONGAP 9 07/08/2017   Lab Results  Component Value Date   CHOL 182 07/13/2018   Lab Results  Component Value Date   HDL 42 07/13/2018   Lab Results  Component Value Date   LDLCALC 118 (H) 07/13/2018   Lab Results  Component Value Date   TRIG 110 07/13/2018   Lab Results  Component Value Date   CHOLHDL 5.8 (H) 02/09/2016   Lab Results  Component Value Date   HGBA1C 8.6 (A) 10/17/2018      Assessment & Plan:   1. Essential hypertension Blood pressures are elevated today. She will continue to take medications as prescribed, to decrease high sodium intake, excessive alcohol intake, increase potassium intake, smoking cessation, and increase physical activity of at least 30 minutes of cardio activity daily. She will continue to follow Heart Healthy or DASH diet. - POCT urinalysis dipstick  2. Controlled type 2 diabetes mellitus with complication, without  long-term current use of insulin (HCC) - Glucose (CBG) - HgB A1c - saxagliptin HCl (ONGLYZA) 2.5 MG TABS tablet; Take 1 tablet (2.5 mg total) by mouth daily.  Dispense: 30 tablet; Refill: 3  3. Hemoglobin A1C between 7% and 9% indicating borderline diabetic control Hgb A1c is stable at 8.6 today. We will re-assess in 3 months.   4. Hyperlipidemia, unspecified hyperlipidemia type  5. Follow up She will follow up in 3 months.  She will follow up in 1 month for blood pressure re-check only.   Meds ordered this encounter  Medications  . saxagliptin HCl (ONGLYZA) 2.5 MG TABS tablet    Sig: Take 1 tablet (2.5 mg total) by mouth daily.    Dispense:  30 tablet    Refill:  3    Orders Placed This Encounter  Procedures  . POCT urinalysis dipstick  . Glucose (CBG)  . HgB A1c    Referral Orders  No referral(s) requested today    Kathe Becton,  MSN, FNP-BC [facility] [address] [place], [place] [address] [phone] fax   Problem List Items Addressed This Visit      Cardiovascular and Mediastinum   Essential hypertension - Primary   Relevant Orders   POCT urinalysis dipstick (Completed)     Endocrine   Controlled type 2 diabetes mellitus with complication, without long-term current use of insulin (HCC)   Relevant Medications   saxagliptin HCl (ONGLYZA) 2.5 MG TABS tablet   Other Relevant Orders   Glucose (CBG) (Completed)   HgB A1c (Completed)     Other   Hyperlipidemia    Other Visit Diagnoses    Hemoglobin A1C between 7% and 9% indicating borderline diabetic control       Relevant Medications   saxagliptin HCl (ONGLYZA) 2.5 MG TABS tablet   Follow up          Meds  ordered this encounter  Medications  . saxagliptin HCl (ONGLYZA) 2.5 MG TABS tablet    Sig: Take 1 tablet (2.5 mg total) by mouth daily.    Dispense:  30 tablet    Refill:  3    Follow-up: Return in about  3 months (around 01/17/2019).    Azzie Glatter, FNP

## 2018-10-18 ENCOUNTER — Ambulatory Visit: Payer: Self-pay | Admitting: Family Medicine

## 2018-10-18 DIAGNOSIS — E119 Type 2 diabetes mellitus without complications: Secondary | ICD-10-CM | POA: Insufficient documentation

## 2018-10-18 DIAGNOSIS — R7303 Prediabetes: Secondary | ICD-10-CM | POA: Insufficient documentation

## 2018-10-24 ENCOUNTER — Other Ambulatory Visit: Payer: Self-pay | Admitting: Family Medicine

## 2018-11-07 DIAGNOSIS — Z20828 Contact with and (suspected) exposure to other viral communicable diseases: Secondary | ICD-10-CM | POA: Diagnosis not present

## 2018-11-20 IMAGING — US US ABDOMEN COMPLETE
1 series · 14 of 25 positions shown · non-contrast
Comparison: 01/06/2007.

CLINICAL DATA: Hematuria.  Epigastric pain.

EXAM:
ABDOMEN ULTRASOUND COMPLETE

[Series 1: us abdomen complete · 0.27mm/px · 14 of 88 slices shown]
[im 1/88]
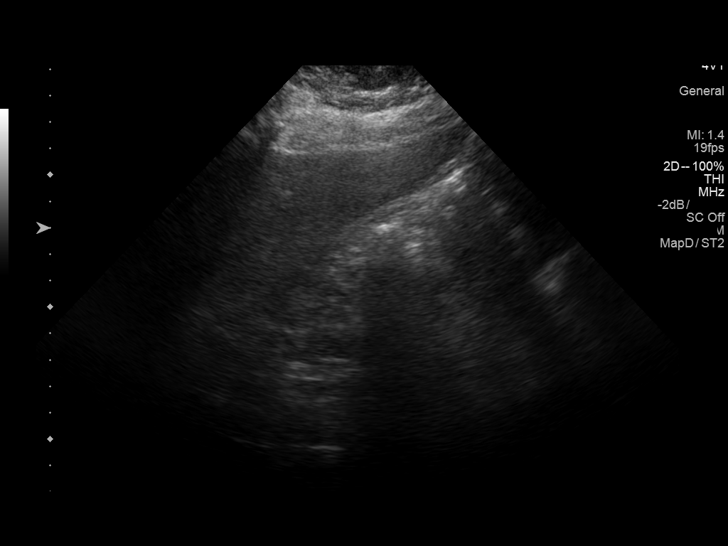
[im 8/88]
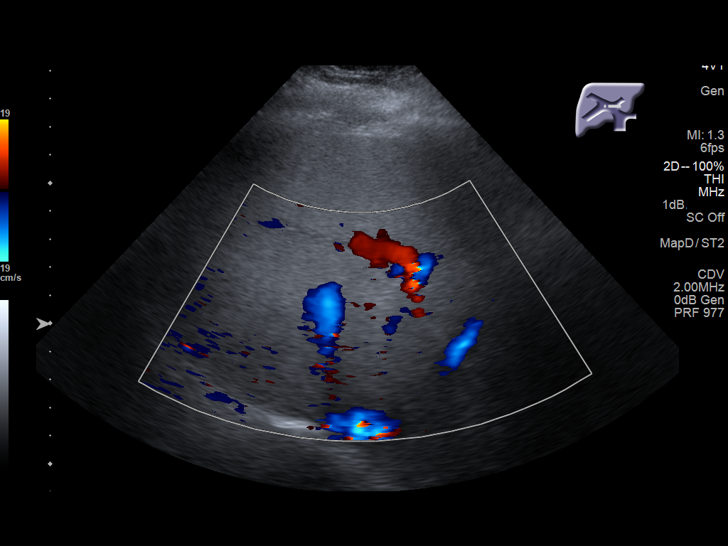
[im 15/88]
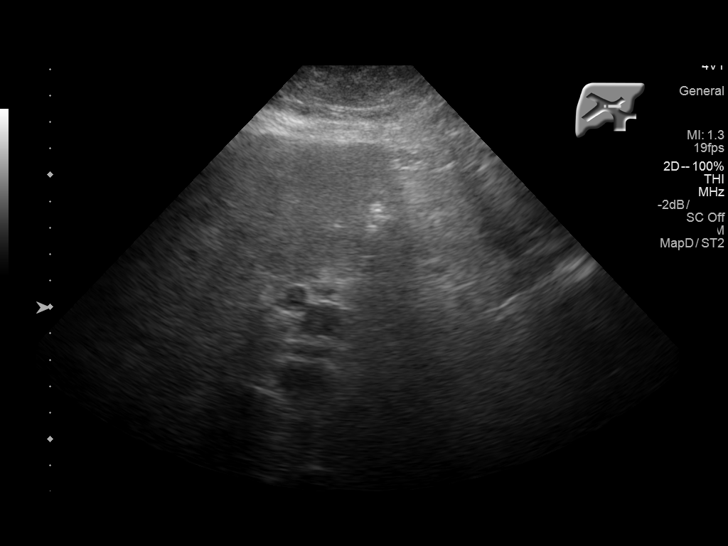
[im 22/88]
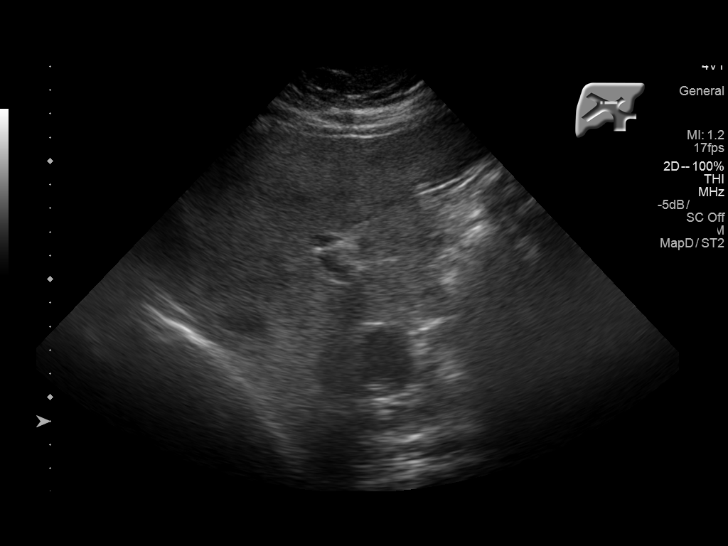
[im 30/88]
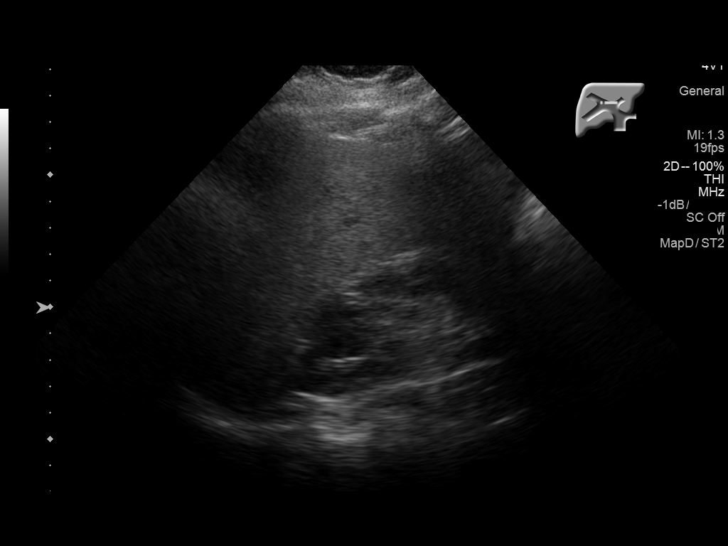
[im 33/88]
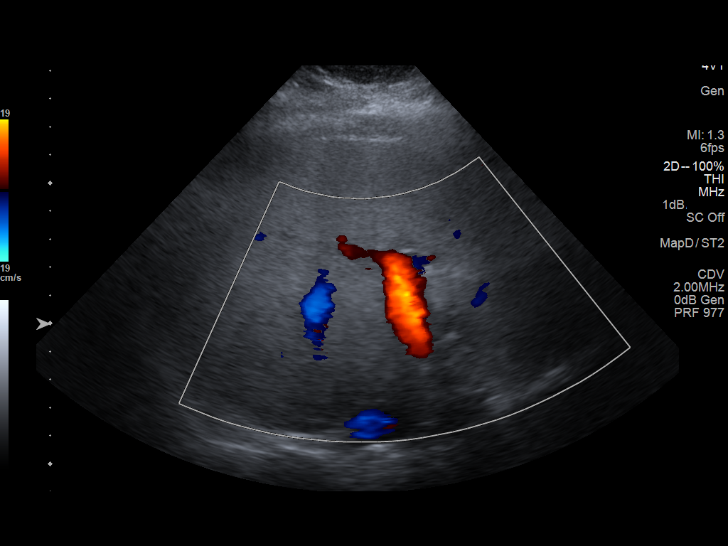
[im 40/88]
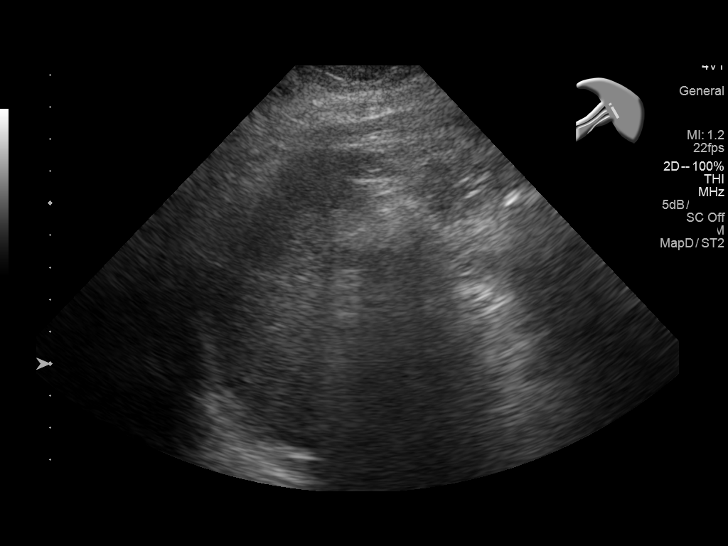
[im 48/88]
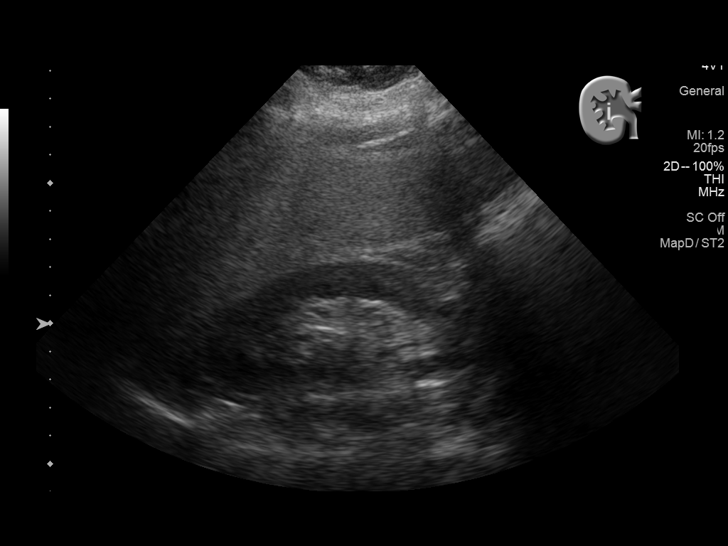
[im 55/88]
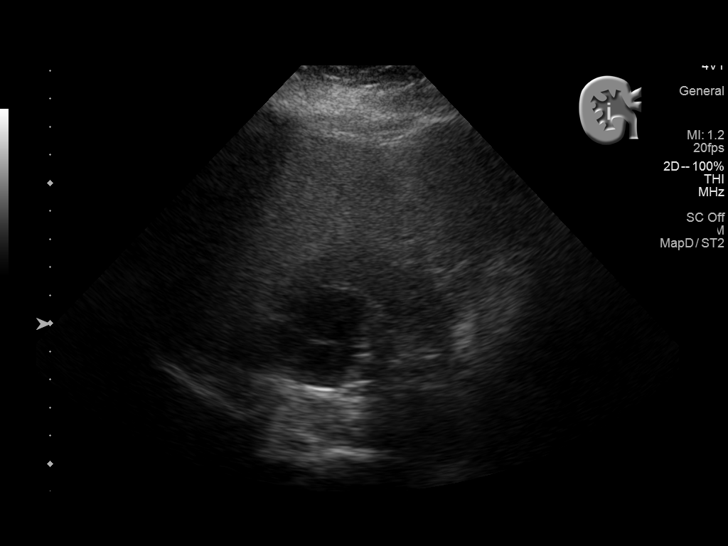
[im 59/88]
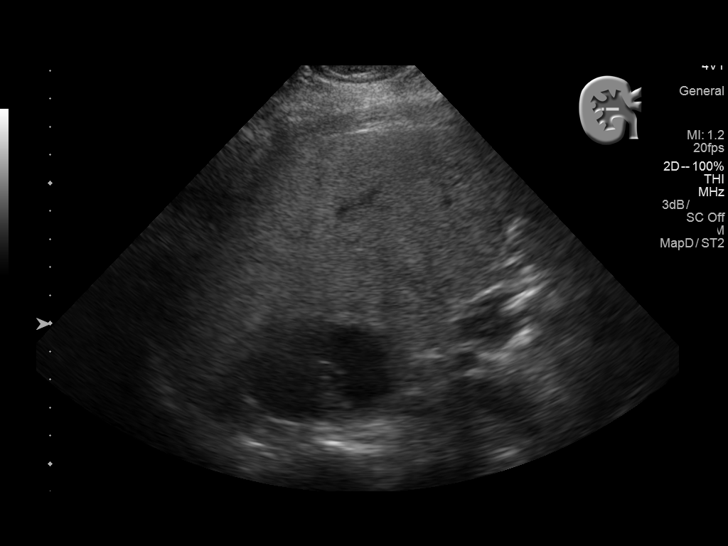
[im 66/88]
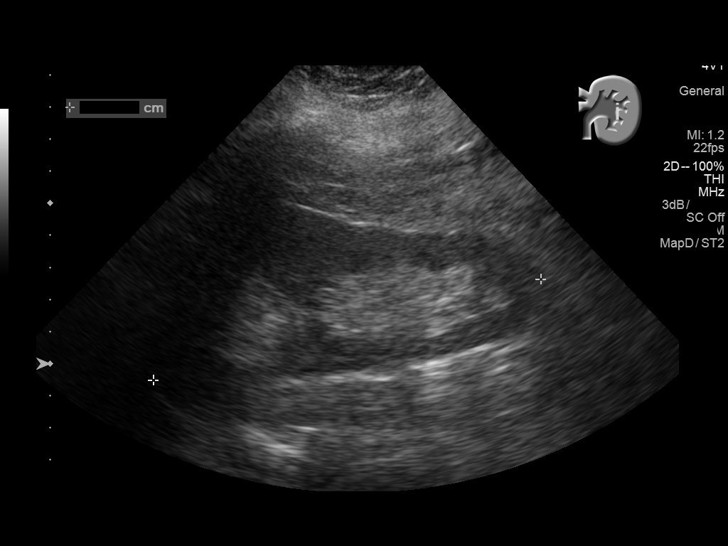
[im 73/88]
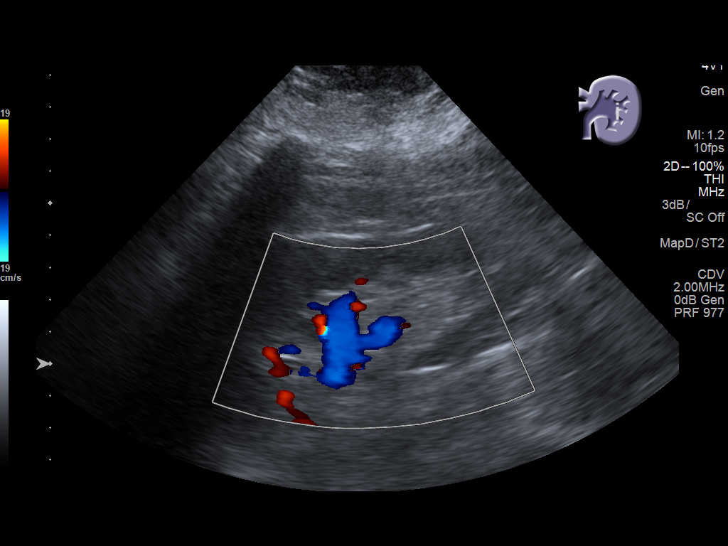
[im 80/88]
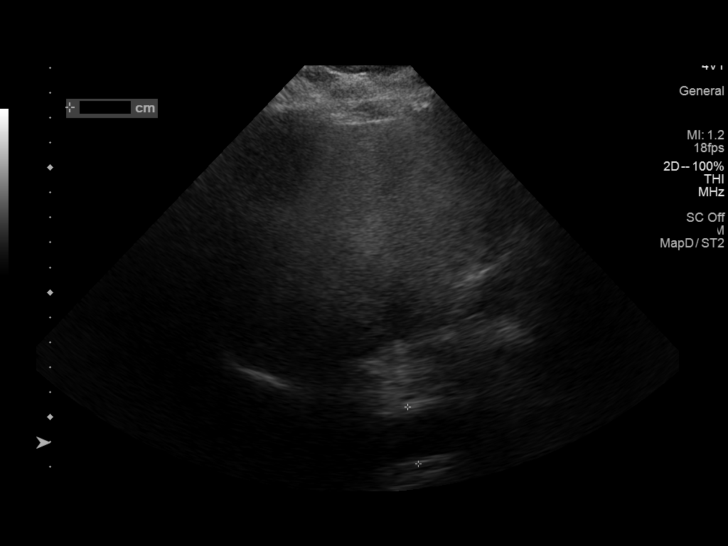
[im 88/88]
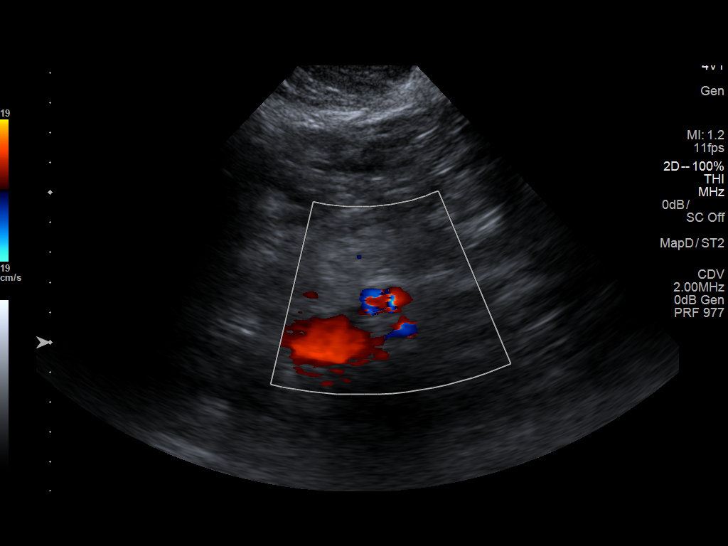

[14 of 25 positions shown; findings below may reference images not displayed]

FINDINGS: Gallbladder: Cholecystectomy.

Common bile duct: Diameter: 7.1 mm

Liver: Increased hepatic echogenicity consistent fatty infiltration
and/or hepatocellular disease. Portal vein is patent on color
Doppler imaging with normal direction of blood flow towards the
liver.

IVC: No abnormality visualized.

Pancreas: Visualized portion unremarkable.

Spleen: Size and appearance within normal limits.

Right Kidney: Length: 11.5 cm. 3.4 cm cyst with thin septation, most
likely benign cyst. No hydronephrosis.

Left Kidney: Length: 13.3 cm. Echogenicity within normal limits. No
mass or hydronephrosis visualized.

Abdominal aorta: No aneurysm visualized.

Other findings: None.
IMPRESSION: 1. Right renal 3.4 cm cyst with thin septation, most likely a benign
cyst.

2.  Cholecystectomy.  No biliary distention.

## 2019-01-01 ENCOUNTER — Ambulatory Visit
Admission: EM | Admit: 2019-01-01 | Discharge: 2019-01-01 | Disposition: A | Discharge status: Home / Self Care | Payer: BC Managed Care – PPO | Attending: Physician Assistant | Admitting: Physician Assistant

## 2019-01-01 DIAGNOSIS — N898 Other specified noninflammatory disorders of vagina: Secondary | ICD-10-CM | POA: Diagnosis not present

## 2019-01-01 LAB — POCT FASTING CBG KUC MANUAL ENTRY: POCT Glucose (KUC): 234 mg/dL — AB (ref 70–99)

## 2019-01-01 MED ORDER — FLUCONAZOLE 150 MG PO TABS: 150.0000 mg | ORAL_TABLET | Freq: Every day | ORAL | 0 refills | Status: DC

## 2019-01-01 NOTE — ED Provider Notes (Signed)
[facility]    CSN: [id number] Arrival date & time: 01/01/19  1434      History   Chief Complaint Chief Complaint  Patient presents with  . Vaginal Itching    HPI Cynthia Patton is a 58 y.o. female.   58 year old female comes in for uncontrolled diabetes and few day history of vaginal itching.  Patient states feels that Metformin causes bad taste in the mouth, and has not been taking it.  PCP tried a different medication, but given insurance, was over $100.  States new insurance kicks in January 2021, and PCP would like her to continue Metformin until then.  She has been noncompliant on Metformin then, and has had increased blood sugar.  Now with vaginal itching.  Denies any urinary symptoms such as frequency, dysuria, hematuria.  Denies abdominal pain, nausea, vomiting, diarrhea.  States just needs to "hold out until the new year".     Past Medical History:  Diagnosis Date  . Diabetes mellitus without complication ([place])   . Hypertension   . Thyroid disease     Patient Active Problem List   Diagnosis Date Noted  . Hemoglobin A1C between 7% and 9% indicating borderline diabetic control 10/18/2018  . Positive ANA (antinuclear antibody) 04/06/2016  . Hyperlipidemia 03/19/2016  . Controlled type 2 diabetes mellitus with complication, without long-term current use of insulin ([place]) 02/09/2016  . Essential hypertension 02/09/2016  . Health care maintenance 02/24/2015    Past Surgical History:  Procedure Laterality Date  . ABDOMINAL HYSTERECTOMY    . CHOLECYSTECTOMY    . INCISE AND DRAIN ABCESS     on right leg     OB History   No obstetric history on file.      Home Medications    Prior to Admission medications   Medication Sig Start Date End Date Taking? Authorizing Provider  albuterol (PROVENTIL HFA;VENTOLIN HFA) 108 (90 Base) MCG/ACT inhaler Inhale 2 puffs into the lungs every 4 (four) hours as needed for wheezing or shortness of breath. 02/16/18    Lanae Boast, FNP  aspirin EC 81 MG tablet Take 1 tablet (81 mg total) by mouth daily. 03/19/16   Dorena Dew, FNP  blood glucose meter kit and supplies KIT Dispense based on patient and insurance preference. Use up to four times daily as directed. (FOR ICD-9 250.00, 250.01). 07/07/18   Lanae Boast, FNP  buPROPion (WELLBUTRIN XL) 150 MG 24 hr tablet Take 150 mg by mouth daily.    [provider]  diltiazem (DILACOR XR) 180 MG 24 hr capsule Take 1 capsule (180 mg total) by mouth daily. 07/19/18   Lanae Boast, FNP  fluconazole (DIFLUCAN) 150 MG tablet Take 1 tablet (150 mg total) by mouth daily. Take second dose 72 hours later if symptoms still persists. 01/01/19   Tasia Catchings, Bob Daversa V, PA-C  levocetirizine (XYZAL) 5 MG tablet Take 1 tablet (5 mg total) by mouth every evening. 03/28/18   Azzie Glatter, FNP  levothyroxine (SYNTHROID) 175 MCG tablet TAKE 1 TABLET BY MOUTH ONCE DAILY BEFORE  BREAKFAST 10/24/18   Azzie Glatter, FNP  mometasone (NASONEX) 50 MCG/ACT nasal spray Place 2 sprays into the nose daily. 10/14/16   Dorena Dew, FNP  saxagliptin HCl (ONGLYZA) 2.5 MG TABS tablet Take 1 tablet (2.5 mg total) by mouth daily. 10/17/18   Azzie Glatter, FNP    Family History History reviewed. No pertinent family history.  Social History Social History   Tobacco Use  .  Smoking status: Current Every Day Smoker    Packs/day: 0.50    Types: Cigarettes  . Smokeless tobacco: Never Used  Substance Use Topics  . Alcohol use: No  . Drug use: No     Allergies   Floxin [ocuflox] and Percocet [oxycodone-acetaminophen]   Review of Systems Review of Systems  Reason unable to perform ROS: See HPI as above.     Physical Exam Triage Vital Signs ED Triage Vitals  Enc Vitals Group     BP 01/01/19 1518 (!) 182/80     Pulse Rate 01/01/19 1518 87     Resp 01/01/19 1518 18     Temp 01/01/19 1518 98.6 F (37 C)     Temp Source 01/01/19 1518 Oral     SpO2 01/01/19 1518 96 %      Weight --      Height --      Head Circumference --      Peak Flow --      Pain Score 01/01/19 1519 0     Pain Loc --      Pain Edu? --      Excl. in [place]? --    No data found.  Updated Vital Signs BP (!) 182/80 (BP Location: Left Arm)   Pulse 87   Temp 98.6 F (37 C) (Oral)   Resp 18   SpO2 96%   Physical Exam Constitutional:      General: She is not in acute distress.    Appearance: She is well-developed. She is not diaphoretic.  HENT:     Head: Normocephalic and atraumatic.  Eyes:     Conjunctiva/sclera: Conjunctivae normal.     Pupils: Pupils are equal, round, and reactive to light.  Pulmonary:     Effort: Pulmonary effort is normal. No respiratory distress.  Skin:    General: Skin is warm and dry.  Neurological:     Mental Status: She is alert and oriented to person, place, and time.      UC Treatments / Results  Labs (all labs ordered are listed, but only abnormal results are displayed) Labs Reviewed  POCT FASTING CBG [facility] MANUAL ENTRY - Abnormal; Notable for the following components:      Result Value   POCT Glucose ([facility]) 234 (*)    All other components within normal limits    EKG   Radiology No results found.  Procedures Procedures (including critical care time)  Medications Ordered in UC Medications - No data to display  Initial Impression / Assessment and Plan / UC Course  I have reviewed the triage vital signs and the nursing notes.  Pertinent labs & imaging results that were available during my care of the patient were reviewed by me and considered in my medical decision making (see chart for details).    Will treat for possible yeast with Diflucan.  Patient to continue to monitor blood glucose, warned patient that if blood glucose still not controlled, vaginal itching may return.  Patient expresses understanding, and will continue to monitor symptoms. Return precautions given.   Final Clinical Impressions(s) / UC Diagnoses   Final  diagnoses:  Vaginal itching    ED Prescriptions    Medication Sig Dispense Auth. Provider   fluconazole (DIFLUCAN) 150 MG tablet Take 1 tablet (150 mg total) by mouth daily. Take second dose 72 hours later if symptoms still persists. 2 tablet Ok Edwards, PA-C     PDMP not reviewed this encounter.   Tasia Catchings, Doren Kaspar  V, PA-C 01/01/19 1553

## 2019-01-01 NOTE — Discharge Instructions (Addendum)
Start diflucan as directed. Refrain from new products for now. Follow up with PCP for blood sugar control.

## 2019-01-01 NOTE — ED Triage Notes (Signed)
Pt states her CGB is 234, doesn't taker her metformin every day. C/o feeling fatigue and vaginal itching.

## 2019-01-14 ENCOUNTER — Other Ambulatory Visit: Payer: Self-pay

## 2019-01-14 ENCOUNTER — Encounter: Payer: Self-pay | Admitting: Emergency Medicine

## 2019-01-14 ENCOUNTER — Ambulatory Visit
Admission: EM | Admit: 2019-01-14 | Discharge: 2019-01-14 | Disposition: A | Discharge status: Home / Self Care | Payer: 59 | Attending: Emergency Medicine | Admitting: Emergency Medicine

## 2019-01-14 DIAGNOSIS — Z20822 Contact with and (suspected) exposure to covid-19: Secondary | ICD-10-CM

## 2019-01-14 NOTE — Discharge Instructions (Signed)
Your COVID test is pending - it is important to quarantine / isolate at home until your results are back. °If you test positive and would like further evaluation for persistent or worsening symptoms, you may schedule an E-visit or virtual (video) visit throughout the [facility] MyChart app or website. ° °PLEASE NOTE: If you develop severe chest pain or shortness of breath please go to the ER or call [phone] for further evaluation --> DO NOT schedule electronic or virtual visits for this. °Please call our office for further guidance / recommendations as needed. °

## 2019-01-14 NOTE — ED Triage Notes (Signed)
Pt presents to [facility] for assessment after being exposed to [place] on Christmas.  Denies symptoms at this time.

## 2019-01-14 NOTE — ED Provider Notes (Signed)
[facility]    CSN: [id number] Arrival date & time: 01/14/19  1349      History   Chief Complaint Chief Complaint  Patient presents with  . COVID Exposure    HPI Cynthia Patton is a 59 y.o. female history diabetes, hypertension, thyroid disease  Presenting for Covid testing: Exposure: Cousin who became symptomatic 12/26, tested positive a few days ago Date of exposure: Christmas Day Any fever, symptoms since exposure: None   Past Medical History:  Diagnosis Date  . Diabetes mellitus without complication ([place])   . Hypertension   . Thyroid disease     Patient Active Problem List   Diagnosis Date Noted  . Hemoglobin A1C between 7% and 9% indicating borderline diabetic control 10/18/2018  . Positive ANA (antinuclear antibody) 04/06/2016  . Hyperlipidemia 03/19/2016  . Controlled type 2 diabetes mellitus with complication, without long-term current use of insulin ([place]) 02/09/2016  . Essential hypertension 02/09/2016  . Health care maintenance 02/24/2015    Past Surgical History:  Procedure Laterality Date  . ABDOMINAL HYSTERECTOMY    . CHOLECYSTECTOMY    . INCISE AND DRAIN ABCESS     on right leg     OB History   No obstetric history on file.      Home Medications    Prior to Admission medications   Medication Sig Start Date End Date Taking? Authorizing Provider  albuterol (PROVENTIL HFA;VENTOLIN HFA) 108 (90 Base) MCG/ACT inhaler Inhale 2 puffs into the lungs every 4 (four) hours as needed for wheezing or shortness of breath. 02/16/18   Lanae Boast, FNP  aspirin EC 81 MG tablet Take 1 tablet (81 mg total) by mouth daily. 03/19/16   Dorena Dew, FNP  blood glucose meter kit and supplies KIT Dispense based on patient and insurance preference. Use up to four times daily as directed. (FOR ICD-9 250.00, 250.01). 07/07/18   Lanae Boast, FNP  buPROPion (WELLBUTRIN XL) 150 MG 24 hr tablet Take 150 mg by mouth daily.    [provider]    diltiazem (DILACOR XR) 180 MG 24 hr capsule Take 1 capsule (180 mg total) by mouth daily. 07/19/18   Lanae Boast, FNP  fluconazole (DIFLUCAN) 150 MG tablet Take 1 tablet (150 mg total) by mouth daily. Take second dose 72 hours later if symptoms still persists. 01/01/19   Tasia Catchings, Amy V, PA-C  levocetirizine (XYZAL) 5 MG tablet Take 1 tablet (5 mg total) by mouth every evening. 03/28/18   Azzie Glatter, FNP  levothyroxine (SYNTHROID) 175 MCG tablet TAKE 1 TABLET BY MOUTH ONCE DAILY BEFORE  BREAKFAST 10/24/18   Azzie Glatter, FNP  mometasone (NASONEX) 50 MCG/ACT nasal spray Place 2 sprays into the nose daily. 10/14/16   Dorena Dew, FNP  saxagliptin HCl (ONGLYZA) 2.5 MG TABS tablet Take 1 tablet (2.5 mg total) by mouth daily. 10/17/18   Azzie Glatter, FNP    Family History History reviewed. No pertinent family history.  Social History Social History   Tobacco Use  . Smoking status: Current Every Day Smoker    Packs/day: 0.50    Types: Cigarettes  . Smokeless tobacco: Never Used  Substance Use Topics  . Alcohol use: No  . Drug use: No     Allergies   Floxin [ocuflox] and Percocet [oxycodone-acetaminophen]   Review of Systems Review of Systems  Constitutional: Negative for fatigue and fever.  HENT: Negative for ear pain, sinus pain, sore throat and voice change.  Eyes: Negative for pain, redness and visual disturbance.  Respiratory: Negative for cough and shortness of breath.   Cardiovascular: Negative for chest pain and palpitations.  Gastrointestinal: Negative for abdominal pain, diarrhea and vomiting.  Musculoskeletal: Negative for arthralgias and myalgias.  Skin: Negative for rash and wound.  Neurological: Negative for syncope and headaches.     Physical Exam Triage Vital Signs ED Triage Vitals  Enc Vitals Group     BP      Pulse      Resp      Temp      Temp src      SpO2      Weight      Height      Head Circumference      Peak Flow      Pain  Score      Pain Loc      Pain Edu?      Excl. in [place]?    No data found.  Updated Vital Signs BP (!) 166/85 (BP Location: Left Arm)   Pulse 86   Temp 98 F (36.7 C) (Temporal)   Resp 16   SpO2 95%   Visual Acuity Right Eye Distance:   Left Eye Distance:   Bilateral Distance:    Right Eye Near:   Left Eye Near:    Bilateral Near:     Physical Exam Constitutional:      General: She is not in acute distress. HENT:     Head: Normocephalic and atraumatic.  Eyes:     General: No scleral icterus.    Pupils: Pupils are equal, round, and reactive to light.  Cardiovascular:     Rate and Rhythm: Normal rate.  Pulmonary:     Effort: Pulmonary effort is normal.  Skin:    Coloration: Skin is not jaundiced or pale.  Neurological:     Mental Status: She is alert and oriented to person, place, and time.      UC Treatments / Results  Labs (all labs ordered are listed, but only abnormal results are displayed) Labs Reviewed  NOVEL CORONAVIRUS, NAA    EKG   Radiology No results found.  Procedures Procedures (including critical care time)  Medications Ordered in UC Medications - No data to display  Initial Impression / Assessment and Plan / UC Course  I have reviewed the triage vital signs and the nursing notes.  Pertinent labs & imaging results that were available during my care of the patient were reviewed by me and considered in my medical decision making (see chart for details).     Patient afebrile, nontoxic, with SpO2 95%.  Covid PCR pending.  Patient to quarantine until results are back.  We will continue supportive management.  Return precautions discussed, patient verbalized understanding and is agreeable to plan. Final Clinical Impressions(s) / UC Diagnoses   Final diagnoses:  Exposure to COVID-19 virus     Discharge Instructions     Your COVID test is pending - it is important to quarantine / isolate at home until your results are back. If you test  positive and would like further evaluation for persistent or worsening symptoms, you may schedule an E-visit or virtual (video) visit throughout the [facility] app or website.  PLEASE NOTE: If you develop severe chest pain or shortness of breath please go to the ER or call [id number] for further evaluation --> DO NOT schedule electronic or virtual visits for this. Please call our office for further  guidance / recommendations as needed.    ED Prescriptions    None     PDMP not reviewed this encounter.   Hall[place], [place], [place] 01/14/19 1530

## 2019-01-14 NOTE — ED Notes (Signed)
Patient able to ambulate independently  

## 2019-01-15 LAB — NOVEL CORONAVIRUS, NAA: SARS-CoV-2, NAA: NOT DETECTED

## 2019-01-16 ENCOUNTER — Ambulatory Visit (INDEPENDENT_AMBULATORY_CARE_PROVIDER_SITE_OTHER): Payer: 59 | Admitting: Family Medicine

## 2019-01-16 ENCOUNTER — Encounter: Payer: Self-pay | Admitting: Family Medicine

## 2019-01-16 ENCOUNTER — Other Ambulatory Visit: Payer: Self-pay

## 2019-01-16 DIAGNOSIS — Z09 Encounter for follow-up examination after completed treatment for conditions other than malignant neoplasm: Secondary | ICD-10-CM | POA: Diagnosis not present

## 2019-01-16 DIAGNOSIS — E039 Hypothyroidism, unspecified: Secondary | ICD-10-CM | POA: Diagnosis not present

## 2019-01-16 DIAGNOSIS — E118 Type 2 diabetes mellitus with unspecified complications: Secondary | ICD-10-CM | POA: Diagnosis not present

## 2019-01-16 DIAGNOSIS — I10 Essential (primary) hypertension: Secondary | ICD-10-CM

## 2019-01-16 MED ORDER — ONGLYZA 2.5 MG PO TABS: 2.5000 mg | ORAL_TABLET | Freq: Every day | ORAL | 3 refills | Status: DC

## 2019-01-16 MED ORDER — LEVOTHYROXINE SODIUM 175 MCG PO TABS: 175.0000 ug | ORAL_TABLET | Freq: Every day | ORAL | 3 refills | Status: DC

## 2019-01-16 NOTE — Progress Notes (Signed)
Virtual Visit via Telephone Note  I connected with Cynthia Patton on 01/17/19 at 10:20 AM EST by telephone and verified that I am speaking with the correct person using two identifiers.   I discussed the limitations, risks, security and privacy concerns of performing an evaluation and management service by telephone and the availability of in person appointments. I also discussed with the patient that there may be a patient responsible charge related to this service. The patient expressed understanding and agreed to proceed.   History of Present Illness:  Past Medical History:  Diagnosis Date  . Diabetes mellitus without complication ([place])   . Hypertension   . Thyroid disease     Social History   Socioeconomic History  . Marital status: Single    Spouse name: Not on file  . Number of children: Not on file  . Years of education: Not on file  . Highest education level: Not on file  Occupational History  . Not on file  Tobacco Use  . Smoking status: Current Every Day Smoker    Packs/day: 0.50    Types: Cigarettes  . Smokeless tobacco: Never Used  Substance and Sexual Activity  . Alcohol use: No  . Drug use: No  . Sexual activity: Not Currently  Other Topics Concern  . Not on file  Social History Narrative  . Not on file   Social Determinants of Health   Financial Resource Strain:   . Difficulty of Paying Living Expenses: Not on file  Food Insecurity:   . Worried About Charity fundraiser in the Last Year: Not on file  . Ran Out of Food in the Last Year: Not on file  Transportation Needs:   . Lack of Transportation (Medical): Not on file  . Lack of Transportation (Non-Medical): Not on file  Physical Activity:   . Days of Exercise per Week: Not on file  . Minutes of Exercise per Session: Not on file  Stress:   . Feeling of Stress : Not on file  Social Connections:   . Frequency of Communication with Friends and Family: Not on file  . Frequency of Social Gatherings  with Friends and Family: Not on file  . Attends Religious Services: Not on file  . Active Member of Clubs or Organizations: Not on file  . Attends Archivist Meetings: Not on file  . Marital Status: Not on file  Intimate Partner Violence:   . Fear of Current or Ex-Partner: Not on file  . Emotionally Abused: Not on file  . Physically Abused: Not on file  . Sexually Abused: Not on file    Family History  Problem Relation Age of Onset  . Kidney disease Mother   . Hypertension Mother   . Hypertension Sister   . Heart disease Brother     Past Surgical History:  Procedure Laterality Date  . ABDOMINAL HYSTERECTOMY    . CHOLECYSTECTOMY    . INCISE AND DRAIN ABCESS     on right leg     Allergies  Allergen Reactions  . Floxin [Ocuflox] Other (See Comments)    Disoriented  . Ofloxacin Other (See Comments)    Disoriented  . Percocet [Oxycodone-Acetaminophen]    Current Status: Since her last office visit, she is doing well with no complaints. She does not regularly monitor her blood glucose levels. She denies fatigue, frequent urination, blurred vision, excessive hunger, excessive thirst, weight gain, weight loss, and poor wound healing. She continues to check her  feet regularly. She states that her dosage of Bupropion XL 150 mg is not effective. She admits that she only takes intermittently. She denies visual changes, chest pain, cough, shortness of breath, heart palpitations, and falls. She has occasional headaches and dizziness with position changes. Denies severe headaches, confusion, seizures, double vision, and blurred vision, nausea and vomiting. She denies fevers, chills, recent infections, weight loss, and night sweats.  No reports of GI problems such as diarrhea, and constipation. She has no reports of blood in stools, dysuria and hematuria. No depression or anxiety reported today. She denies suicidal ideations, homicidal ideations, or auditory hallucinations. She denies  pain today.    Observations/Objective:  Telephone Virtual Visit   Assessment and Plan:  1. Controlled type 2 diabetes mellitus with complication, without long-term current use of insulin ([place]) She will continue medication as prescribed, to decrease foods/beverages high in sugars and carbs and follow Heart Healthy or DASH diet. Increase physical activity to at least 30 minutes cardio exercise daily.  - saxagliptin HCl (ONGLYZA) 2.5 MG TABS tablet; Take 1 tablet (2.5 mg total) by mouth daily.  Dispense: 30 tablet; Refill: 3  2. Acquired hypothyroidism - levothyroxine (LEVOXYL) 175 MCG tablet; Take 1 tablet (175 mcg total) by mouth daily before breakfast.  Dispense: 30 tablet; Refill: 3  3. Essential hypertension She will continue to take medications as prescribed, to decrease high sodium intake, excessive alcohol intake, increase potassium intake, smoking cessation, and increase physical activity of at least 30 minutes of cardio activity daily. She will continue to follow Heart Healthy or DASH diet.  Follow Up Instructions:  I discussed the assessment and treatment plan with the patient. The patient was provided an opportunity to ask questions and all were answered. The patient agreed with the plan and demonstrated an understanding of the instructions.   The patient was advised to call back or seek an in-person evaluation if the symptoms worsen or if the condition fails to improve as anticipated.  I provided 15 minutes of non-face-to-face time during this encounter.   Azzie Glatter, FNP

## 2019-01-22 ENCOUNTER — Other Ambulatory Visit: Payer: Self-pay | Admitting: Family Medicine

## 2019-01-22 DIAGNOSIS — E118 Type 2 diabetes mellitus with unspecified complications: Secondary | ICD-10-CM

## 2019-01-22 DIAGNOSIS — E119 Type 2 diabetes mellitus without complications: Secondary | ICD-10-CM

## 2019-01-22 DIAGNOSIS — R7303 Prediabetes: Secondary | ICD-10-CM

## 2019-01-22 MED ORDER — SITAGLIPTIN PHOSPHATE 25 MG PO TABS: 25.0000 mg | ORAL_TABLET | Freq: Every day | ORAL | 3 refills | Status: DC

## 2019-02-07 ENCOUNTER — Telehealth: Payer: Self-pay | Admitting: Family Medicine

## 2019-02-07 ENCOUNTER — Other Ambulatory Visit: Payer: Self-pay

## 2019-02-07 DIAGNOSIS — I10 Essential (primary) hypertension: Secondary | ICD-10-CM

## 2019-02-07 MED ORDER — DILTIAZEM HCL ER 180 MG PO CP24: 180.0000 mg | ORAL_CAPSULE | Freq: Every day | ORAL | 2 refills | Status: DC

## 2019-02-07 NOTE — Telephone Encounter (Signed)
Error

## 2019-02-12 ENCOUNTER — Encounter (HOSPITAL_COMMUNITY): Payer: Self-pay

## 2019-02-12 ENCOUNTER — Emergency Department (HOSPITAL_COMMUNITY): Payer: 59

## 2019-02-12 ENCOUNTER — Other Ambulatory Visit: Payer: Self-pay

## 2019-02-12 ENCOUNTER — Emergency Department (HOSPITAL_COMMUNITY)
Admission: EM | Admit: 2019-02-12 | Discharge: 2019-02-12 | Disposition: A | Discharge status: Home / Self Care | Payer: 59 | Attending: Emergency Medicine | Admitting: Emergency Medicine

## 2019-02-12 DIAGNOSIS — F17210 Nicotine dependence, cigarettes, uncomplicated: Secondary | ICD-10-CM | POA: Insufficient documentation

## 2019-02-12 DIAGNOSIS — Z7984 Long term (current) use of oral hypoglycemic drugs: Secondary | ICD-10-CM | POA: Insufficient documentation

## 2019-02-12 DIAGNOSIS — R079 Chest pain, unspecified: Secondary | ICD-10-CM | POA: Insufficient documentation

## 2019-02-12 DIAGNOSIS — M79605 Pain in left leg: Secondary | ICD-10-CM | POA: Diagnosis present

## 2019-02-12 DIAGNOSIS — E119 Type 2 diabetes mellitus without complications: Secondary | ICD-10-CM | POA: Diagnosis not present

## 2019-02-12 DIAGNOSIS — R05 Cough: Secondary | ICD-10-CM | POA: Insufficient documentation

## 2019-02-12 DIAGNOSIS — R200 Anesthesia of skin: Secondary | ICD-10-CM | POA: Insufficient documentation

## 2019-02-12 DIAGNOSIS — M542 Cervicalgia: Secondary | ICD-10-CM | POA: Insufficient documentation

## 2019-02-12 DIAGNOSIS — M79602 Pain in left arm: Secondary | ICD-10-CM | POA: Insufficient documentation

## 2019-02-12 DIAGNOSIS — Z7982 Long term (current) use of aspirin: Secondary | ICD-10-CM | POA: Insufficient documentation

## 2019-02-12 DIAGNOSIS — Z79899 Other long term (current) drug therapy: Secondary | ICD-10-CM | POA: Insufficient documentation

## 2019-02-12 DIAGNOSIS — I10 Essential (primary) hypertension: Secondary | ICD-10-CM | POA: Diagnosis not present

## 2019-02-12 LAB — CBC WITH DIFFERENTIAL/PLATELET
Abs Immature Granulocytes: 0.03 10*3/uL (ref 0.00–0.07)
Basophils Absolute: 0 10*3/uL (ref 0.0–0.1)
Basophils Relative: 0 %
Eosinophils Absolute: 0.1 10*3/uL (ref 0.0–0.5)
Eosinophils Relative: 1 %
HCT: 43.3 % (ref 36.0–46.0)
Hemoglobin: 13.6 g/dL (ref 12.0–15.0)
Immature Granulocytes: 0 %
Lymphocytes Relative: 27 %
Lymphs Abs: 2.5 10*3/uL (ref 0.7–4.0)
MCH: 30.2 pg (ref 26.0–34.0)
MCHC: 31.4 g/dL (ref 30.0–36.0)
MCV: 96 fL (ref 80.0–100.0)
Monocytes Absolute: 0.4 10*3/uL (ref 0.1–1.0)
Monocytes Relative: 5 %
Neutro Abs: 6.3 10*3/uL (ref 1.7–7.7)
Neutrophils Relative %: 67 %
Platelets: 356 10*3/uL (ref 150–400)
RBC: 4.51 MIL/uL (ref 3.87–5.11)
RDW: 13.4 % (ref 11.5–15.5)
WBC: 9.4 10*3/uL (ref 4.0–10.5)
nRBC: 0 % (ref 0.0–0.2)

## 2019-02-12 LAB — COMPREHENSIVE METABOLIC PANEL
ALT: 13 U/L (ref 0–44)
AST: 13 U/L — ABNORMAL LOW (ref 15–41)
Albumin: 4.3 g/dL (ref 3.5–5.0)
Alkaline Phosphatase: 77 U/L (ref 38–126)
Anion gap: 10 (ref 5–15)
BUN: 10 mg/dL (ref 6–20)
CO2: 27 mmol/L (ref 22–32)
Calcium: 9.1 mg/dL (ref 8.9–10.3)
Chloride: 105 mmol/L (ref 98–111)
Creatinine, Ser: 0.84 mg/dL (ref 0.44–1.00)
GFR calc Af Amer: >60 mL/min (ref >60)
GFR calc non Af Amer: >60 mL/min (ref >60)
Glucose, Bld: 143 mg/dL — ABNORMAL HIGH (ref 70–99)
Potassium: 3.6 mmol/L (ref 3.5–5.1)
Sodium: 142 mmol/L (ref 135–145)
Total Bilirubin: 0.7 mg/dL (ref 0.3–1.2)
Total Protein: 7.7 g/dL (ref 6.5–8.1)

## 2019-02-12 LAB — URINALYSIS, ROUTINE W REFLEX MICROSCOPIC
Bilirubin Urine: NEGATIVE
Glucose, UA: NEGATIVE mg/dL
Ketones, ur: NEGATIVE mg/dL
Leukocytes,Ua: NEGATIVE
Nitrite: NEGATIVE
Protein, ur: NEGATIVE mg/dL
Specific Gravity, Urine: 1.008 (ref 1.005–1.030)
pH: 6 (ref 5.0–8.0)

## 2019-02-12 LAB — TROPONIN I (HIGH SENSITIVITY)
Troponin I (High Sensitivity): 4 ng/L (ref <18)
Troponin I (High Sensitivity): 5 ng/L (ref <18)

## 2019-02-12 MED ORDER — IBUPROFEN 800 MG PO TABS
800.0000 mg | ORAL_TABLET | Freq: Once | ORAL | Status: AC
  Administered 2019-02-12: 800 mg via ORAL
  Filled 2019-02-12: qty 1

## 2019-02-12 NOTE — ED Notes (Signed)
Dr. Ralene Bathe made aware of pt's BP of 187/90.  Dr. Ralene Bathe is ok with pt to be discharged.

## 2019-02-12 NOTE — ED Triage Notes (Signed)
Patient c/o left arm and left leg pain x 4 days. Patient states she has been using Bio Freeze and Tylenol ES with no relief. Patient denies injuring her extremities. Patient states pain is worse when she is lying down.  Patient also c/o swelling left thigh area.

## 2019-02-12 NOTE — ED Notes (Signed)
Pt ambulated to restroom with a steady gait

## 2019-02-12 NOTE — ED Notes (Signed)
Patient ambulated to the bathroom w/ a steady gate.

## 2019-02-12 NOTE — ED Provider Notes (Signed)
[facility] DEPT Provider Note   CSN: [id number] Arrival date & time: 02/12/19  1545     History Chief Complaint  Patient presents with  . Leg Pain  . Arm Pain    Cynthia Patton is a 59 y.o. female.  The history is provided by the patient and medical records. No language interpreter was used.  Leg Pain Arm Pain   Cynthia Patton is a 59 y.o. female who presents to the Emergency Department complaining of leg pain and arm numbness.  Her left leg began hurting last Thursday.    Her arm began feeling numb and heavy about a week and a half ago.  The lateral portion of her left upper arm from mid arm to elbow feels numbness and heaviness.  Sxs come and goes.  She initially thought it was due to sleeping on it funny.  She has significant pain if she rolls onto her left side.  The arm is described as numb and painful at the same time.    Her left leg began hurting last Thursday.  Leg pain is located in the posterior aspect of the left upper leg.  Pain is described as throbbing to the bone.  She has associated numbness and heaviness that is worse at night.  She has used biofreeze that helped for about a day, now there is no relief.  She has also taken extra strength tylenol without relief.   No injuries.  Had a brief mild HA today (now resolved).  She has intermittent left neck pain for greater than two weeks.  Denies fever, N/V.  Has occasional cough.  Has occasional intermittent chest pain described as a dull ache.    Works from home.      Past Medical History:  Diagnosis Date  . Diabetes mellitus without complication ([place])   . Hypertension   . Thyroid disease     Patient Active Problem List   Diagnosis Date Noted  . Hemoglobin A1C between 7% and 9% indicating borderline diabetic control 10/18/2018  . Positive ANA (antinuclear antibody) 04/06/2016  . Hyperlipidemia 03/19/2016  . Controlled type 2 diabetes mellitus with complication, without long-term  current use of insulin ([place]) 02/09/2016  . Essential hypertension 02/09/2016  . Health care maintenance 02/24/2015    Past Surgical History:  Procedure Laterality Date  . ABDOMINAL HYSTERECTOMY    . CHOLECYSTECTOMY    . INCISE AND DRAIN ABCESS     on right leg      OB History    Gravida  0   Para  0   Term  0   Preterm  0   AB  0   Living  0     SAB  0   TAB  0   Ectopic  0   Multiple  0   Live Births  0           Family History  Problem Relation Age of Onset  . Kidney disease Mother   . Hypertension Mother   . Hypertension Sister   . Heart disease Brother     Social History   Tobacco Use  . Smoking status: Current Every Day Smoker    Packs/day: 0.50    Types: Cigarettes  . Smokeless tobacco: Never Used  Substance Use Topics  . Alcohol use: No  . Drug use: No    Home Medications Prior to Admission medications   Medication Sig Start Date End Date Taking? Authorizing Provider  albuterol (  PROVENTIL HFA;VENTOLIN HFA) 108 (90 Base) MCG/ACT inhaler Inhale 2 puffs into the lungs every 4 (four) hours as needed for wheezing or shortness of breath. 02/16/18  Yes Lanae Boast, FNP  aspirin EC 81 MG tablet Take 1 tablet (81 mg total) by mouth daily. 03/19/16  Yes Dorena Dew, FNP  buPROPion (WELLBUTRIN XL) 150 MG 24 hr tablet Take 150 mg by mouth daily.   Yes [provider]  diltiazem (DILACOR XR) 180 MG 24 hr capsule Take 1 capsule (180 mg total) by mouth daily. 02/07/19  Yes Azzie Glatter, FNP  levocetirizine (XYZAL) 5 MG tablet Take 1 tablet (5 mg total) by mouth every evening. Patient taking differently: Take 5 mg by mouth at bedtime as needed for allergies.  03/28/18  Yes Azzie Glatter, FNP  levothyroxine (LEVOXYL) 175 MCG tablet Take 1 tablet (175 mcg total) by mouth daily before breakfast. 01/16/19  Yes Azzie Glatter, FNP  mometasone (NASONEX) 50 MCG/ACT nasal spray Place 2 sprays into the nose daily. Patient taking  differently: Place 2 sprays into the nose daily as needed (nasal congestion).  10/14/16  Yes Dorena Dew, FNP  sitaGLIPtin (JANUVIA) 25 MG tablet Take 1 tablet (25 mg total) by mouth daily. 01/22/19  Yes Azzie Glatter, FNP  blood glucose meter kit and supplies KIT Dispense based on patient and insurance preference. Use up to four times daily as directed. (FOR ICD-9 250.00, 250.01). 07/07/18   Lanae Boast, FNP  fluconazole (DIFLUCAN) 150 MG tablet Take 1 tablet (150 mg total) by mouth daily. Take second dose 72 hours later if symptoms still persists. Patient not taking: Reported on 01/16/2019 01/01/19   Ok Edwards, PA-C  ONETOUCH VERIO test strip USE UP TO 4 TIMES DAILY AS DIRECTED 10/22/18   [provider]    Allergies    Floxin [ocuflox], Ofloxacin, and Percocet [oxycodone-acetaminophen]  Review of Systems   Review of Systems  All other systems reviewed and are negative.   Physical Exam Updated Vital Signs BP (!) 187/90   Pulse 81   Temp 98.3 F (36.8 C) (Oral)   Resp 14   Ht '5\' 3"'  (1.6 m)   Wt 91.6 kg   SpO2 100%   BMI 35.78 kg/m   Physical Exam Vitals and nursing note reviewed.  Constitutional:      Appearance: She is well-developed.  HENT:     Head: Normocephalic and atraumatic.  Cardiovascular:     Rate and Rhythm: Normal rate and regular rhythm.     Heart sounds: No murmur.  Pulmonary:     Effort: Pulmonary effort is normal. No respiratory distress.     Breath sounds: Normal breath sounds.  Abdominal:     Palpations: Abdomen is soft.     Tenderness: There is no abdominal tenderness. There is no guarding or rebound.  Musculoskeletal:        General: No tenderness.     Comments: 2+radial and DP pulses.  TTP over proximal LUE, proximal LLE without erythema, focal fluctuance.  ROM intact in all four extremities  Skin:    General: Skin is warm and dry.  Neurological:     Mental Status: She is alert and oriented to person, place, and time.      Comments: Visual fields grossly intact.  No assymmetry of facial movements, 5/5 strength in all four extremities with sensation to light touch intact in all four extremities.   Psychiatric:        Behavior:  Behavior normal.     ED Results / Procedures / Treatments   Labs (all labs ordered are listed, but only abnormal results are displayed) Labs Reviewed  COMPREHENSIVE METABOLIC PANEL - Abnormal; Notable for the following components:      Result Value   Glucose, Bld 143 (*)    AST 13 (*)    All other components within normal limits  URINALYSIS, ROUTINE W REFLEX MICROSCOPIC - Abnormal; Notable for the following components:   Hgb urine dipstick MODERATE (*)    Bacteria, UA RARE (*)    All other components within normal limits  CBC WITH DIFFERENTIAL/PLATELET  TROPONIN I (HIGH SENSITIVITY)  TROPONIN I (HIGH SENSITIVITY)    EKG EKG Interpretation  Date/Time:  Monday February 12 2019 17:03:51 EST Ventricular Rate:  80 PR Interval:    QRS Duration: 95 QT Interval:  362 QTC Calculation: 418 R Axis:   78 Text Interpretation: Sinus rhythm Probable left atrial enlargement Confirmed by Quintella Reichert [phone]) on 02/12/2019 5:36:09 PM   Radiology DG Chest 2 View  Result Date: 02/12/2019 CLINICAL DATA:  Left arm and left leg pain for 4 days. EXAM: CHEST - 2 VIEW COMPARISON:  February 22, 2018 FINDINGS: The heart size and mediastinal contours are within normal limits. Both lungs are clear. The visualized skeletal structures are unremarkable. IMPRESSION: No active cardiopulmonary disease. Electronically Signed   By: Abelardo Diesel M.D.   On: 02/12/2019 18:04   CT Head Wo Contrast  Result Date: 02/12/2019 CLINICAL DATA:  Left arm and left leg pain x4 days. EXAM: CT HEAD WITHOUT CONTRAST TECHNIQUE: Contiguous axial images were obtained from the base of the skull through the vertex without intravenous contrast. COMPARISON:  None. FINDINGS: Brain: No evidence of acute infarction, hemorrhage,  hydrocephalus, extra-axial collection or mass lesion/mass effect. Vascular: No hyperdense vessel or unexpected calcification. Skull: Normal. Negative for fracture or focal lesion. Sinuses/Orbits: No acute finding. Other: None. IMPRESSION: No acute intracranial pathology. Electronically Signed   By: Virgina Norfolk M.D.   On: 02/12/2019 18:47    Procedures Procedures (including critical care time)  Medications Ordered in ED Medications  ibuprofen (ADVIL) tablet 800 mg (800 mg Oral Given 02/12/19 1956)    ED Course  I have reviewed the triage vital signs and the nursing notes.  Pertinent labs & imaging results that were available during my care of the patient were reviewed by me and considered in my medical decision making (see chart for details).    MDM Rules/Calculators/A&P                     Patient here for evaluation of two weeks of intermittent numbness, heaviness and pain to the left lateral arm as well as left posterior leg. She is neurologically intact on evaluation but does have tenderness to palpation over the arm and leg. There are no areas concerning for focal abscess or infection. She is well perfused on evaluation. Current presentation is not consistent with hypertensive emergency, dissection. Initial CT head is negative for acute abnormality. Discussed with patient recommendation for further evaluation of her symptoms with MRI and patient declines a study at this time. Discussed importance of outpatient follow-up as well as return precautions.  Final Clinical Impression(s) / ED Diagnoses Final diagnoses:  Left arm pain  Left leg pain  Essential hypertension    Rx / DC Orders ED Discharge Orders    None       Quintella Reichert, MD 02/12/19 2339

## 2019-02-13 ENCOUNTER — Other Ambulatory Visit: Payer: Self-pay | Admitting: Family Medicine

## 2019-02-13 ENCOUNTER — Telehealth: Payer: Self-pay | Admitting: Family Medicine

## 2019-02-13 DIAGNOSIS — R200 Anesthesia of skin: Secondary | ICD-10-CM

## 2019-02-13 NOTE — Telephone Encounter (Signed)
Cynthia Patton need a referral to [facility] (ph [phone]). She was seen in the ED on 02/12/2019 for left arm and left leg numbness.

## 2019-02-20 ENCOUNTER — Other Ambulatory Visit: Payer: Self-pay | Admitting: Nurse Practitioner

## 2019-02-20 ENCOUNTER — Other Ambulatory Visit: Payer: Self-pay | Admitting: Family Medicine

## 2019-02-20 ENCOUNTER — Telehealth: Payer: Self-pay | Admitting: Family Medicine

## 2019-02-20 DIAGNOSIS — M79605 Pain in left leg: Secondary | ICD-10-CM

## 2019-02-20 DIAGNOSIS — M79604 Pain in right leg: Secondary | ICD-10-CM

## 2019-02-20 MED ORDER — IBUPROFEN 800 MG PO TABS: 800.0000 mg | ORAL_TABLET | Freq: Three times a day (TID) | ORAL | 3 refills | Status: DC | PRN

## 2019-02-20 NOTE — Telephone Encounter (Signed)
Cynthia Patton,  Patient's last visit was with you and she is next scheduled with you for 04/2019. Can you please advise on this? Thanks!

## 2019-02-22 NOTE — Telephone Encounter (Signed)
done

## 2019-04-16 ENCOUNTER — Ambulatory Visit: Payer: Self-pay | Admitting: Nurse Practitioner

## 2019-04-16 ENCOUNTER — Ambulatory Visit: Payer: Self-pay | Admitting: Family Medicine

## 2019-04-23 ENCOUNTER — Ambulatory Visit: Payer: Self-pay | Admitting: Nurse Practitioner

## 2019-04-23 DIAGNOSIS — C73 Malignant neoplasm of thyroid gland: Secondary | ICD-10-CM | POA: Insufficient documentation

## 2019-04-25 ENCOUNTER — Ambulatory Visit: Payer: Self-pay | Admitting: Nurse Practitioner

## 2019-04-26 ENCOUNTER — Ambulatory Visit: Payer: Self-pay | Admitting: Nurse Practitioner

## 2019-05-23 ENCOUNTER — Ambulatory Visit (INDEPENDENT_AMBULATORY_CARE_PROVIDER_SITE_OTHER): Payer: 59 | Admitting: Nurse Practitioner

## 2019-05-23 ENCOUNTER — Other Ambulatory Visit: Payer: Self-pay

## 2019-05-23 ENCOUNTER — Encounter: Payer: Self-pay | Admitting: Nurse Practitioner

## 2019-05-23 VITALS — BP 177/77 | HR 63 | Ht 63.0 in | Wt 226.6 lb

## 2019-05-23 DIAGNOSIS — E782 Mixed hyperlipidemia: Secondary | ICD-10-CM | POA: Diagnosis not present

## 2019-05-23 DIAGNOSIS — E118 Type 2 diabetes mellitus with unspecified complications: Secondary | ICD-10-CM | POA: Diagnosis not present

## 2019-05-23 DIAGNOSIS — I10 Essential (primary) hypertension: Secondary | ICD-10-CM

## 2019-05-23 LAB — POCT URINALYSIS DIPSTICK
Bilirubin, UA: NEGATIVE
Glucose, UA: NEGATIVE
Ketones, UA: NEGATIVE
Leukocytes, UA: NEGATIVE
Nitrite, UA: NEGATIVE
Protein, UA: NEGATIVE
Spec Grav, UA: 1.01 (ref 1.010–1.025)
Urobilinogen, UA: 0.2 E.U./dL
pH, UA: 5.5 (ref 5.0–8.0)

## 2019-05-23 MED ORDER — DILTIAZEM HCL ER 180 MG PO CP24: 180.0000 mg | ORAL_CAPSULE | Freq: Every day | ORAL | 2 refills | Status: DC

## 2019-05-23 MED ORDER — BUPROPION HCL ER (SR) 150 MG PO TB12: 150.0000 mg | ORAL_TABLET | Freq: Two times a day (BID) | ORAL | 0 refills | Status: DC

## 2019-05-23 MED ORDER — DILTIAZEM HCL ER 180 MG PO CP24: 180.0000 mg | ORAL_CAPSULE | Freq: Every day | ORAL | 0 refills | Status: DC

## 2019-05-23 NOTE — Progress Notes (Signed)
[place], [place]  [address] Phone:  [phone]   Fax:  [phone]   Established Patient Office Visit  Subjective:  Patient ID: SIAH KANNAN, female    DOB: 09/01/1960  Age: 59 y.o. MRN: [id number]  CC:  Chief Complaint  Patient presents with  . Follow-up    renewal meds  , leg pain is better     HPI Cynthia Patton presents for follow up. She  has a past medical history of Diabetes mellitus without complication ([place]), Hypertension, and Thyroid disease.   Hypertension Patient is here for follow-up of elevated blood pressure. She is not exercising and is adherent to a low-salt diet. Blood pressure is not well controlled at home. She admits that stress increases her BP. She tries to relax and help with stress.  Cardiac symptoms: none. Patient denies chest pain, dyspnea, exertional chest pressure/discomfort, irregular heart beat, lower extremity edema, palpitations and syncope. Cardiovascular risk factors: diabetes mellitus, obesity (BMI >= 30 kg/m2), sedentary lifestyle and smoking/ tobacco exposure. Use of agents associated with hypertension: thyroid hormones. History of target organ damage: none.  She is being followed by Endocrinology at [facility] for her diabetes and thyroid disorder.  Past Medical History:  Diagnosis Date  . Diabetes mellitus without complication ([place])   . Hypertension   . Thyroid disease     Past Surgical History:  Procedure Laterality Date  . ABDOMINAL HYSTERECTOMY    . CHOLECYSTECTOMY    . INCISE AND DRAIN ABCESS     on right leg     Family History  Problem Relation Age of Onset  . Kidney disease Mother   . Hypertension Mother   . Hypertension Sister   . Heart disease Brother     Social History   Socioeconomic History  . Marital status: Single    Spouse name: Not on file  . Number of children: Not on file  . Years of education: Not on file  . Highest education level: Not on file  Occupational  History  . Not on file  Tobacco Use  . Smoking status: Current Every Day Smoker    Packs/day: 0.50    Types: Cigarettes  . Smokeless tobacco: Never Used  Substance and Sexual Activity  . Alcohol use: No  . Drug use: No  . Sexual activity: Not Currently  Other Topics Concern  . Not on file  Social History Narrative  . Not on file   Social Determinants of Health   Financial Resource Strain:   . Difficulty of Paying Living Expenses:   Food Insecurity:   . Worried About Charity fundraiser in the Last Year:   . Arboriculturist in the Last Year:   Transportation Needs:   . Film/video editor (Medical):   Marland Kitchen Lack of Transportation (Non-Medical):   Physical Activity:   . Days of Exercise per Week:   . Minutes of Exercise per Session:   Stress:   . Feeling of Stress :   Social Connections:   . Frequency of Communication with Friends and Family:   . Frequency of Social Gatherings with Friends and Family:   . Attends Religious Services:   . Active Member of Clubs or Organizations:   . Attends Archivist Meetings:   Marland Kitchen Marital Status:   Intimate Partner Violence:   . Fear of Current or Ex-Partner:   . Emotionally Abused:   Marland Kitchen Physically Abused:   .  Sexually Abused:     Outpatient Medications Prior to Visit  Medication Sig Dispense Refill  . albuterol (PROVENTIL HFA;VENTOLIN HFA) 108 (90 Base) MCG/ACT inhaler Inhale 2 puffs into the lungs every 4 (four) hours as needed for wheezing or shortness of breath. 1 Inhaler 1  . aspirin EC 81 MG tablet Take 1 tablet (81 mg total) by mouth daily. 30 tablet 11  . blood glucose meter kit and supplies KIT Dispense based on patient and insurance preference. Use up to four times daily as directed. (FOR ICD-9 250.00, 250.01). 1 each 0  . ibuprofen (ADVIL) 800 MG tablet Take 1 tablet (800 mg total) by mouth every 8 (eight) hours as needed. 30 tablet 3  . levothyroxine (LEVOXYL) 175 MCG tablet Take 1 tablet (175 mcg total) by mouth  daily before breakfast. 30 tablet 3  . mometasone (NASONEX) 50 MCG/ACT nasal spray Place 2 sprays into the nose daily. (Patient taking differently: Place 2 sprays into the nose daily as needed (nasal congestion). ) 17 g 1  . ONETOUCH VERIO test strip USE UP TO 4 TIMES DAILY AS DIRECTED    . Semaglutide,0.25 or 0.5MG/DOS, 2 MG/1.5ML SOPN Use 0.25 mg once weekly for 4 weeks, then increase to 0.5 mg once weekly    . buPROPion (WELLBUTRIN XL) 150 MG 24 hr tablet Take 150 mg by mouth daily.    Marland Kitchen diltiazem (DILACOR XR) 180 MG 24 hr capsule Take 1 capsule (180 mg total) by mouth daily. (Patient not taking: Reported on 05/23/2019) 30 capsule 2  . fluconazole (DIFLUCAN) 150 MG tablet Take 1 tablet (150 mg total) by mouth daily. Take second dose 72 hours later if symptoms still persists. (Patient not taking: Reported on 01/16/2019) 2 tablet 0  . levocetirizine (XYZAL) 5 MG tablet Take 1 tablet (5 mg total) by mouth every evening. (Patient not taking: Reported on 05/23/2019) 30 tablet 3  . sitaGLIPtin (JANUVIA) 25 MG tablet Take 1 tablet (25 mg total) by mouth daily. (Patient not taking: Reported on 05/23/2019) 30 tablet 3   No facility-administered medications prior to visit.    Allergies  Allergen Reactions  . Floxin [Ocuflox] Other (See Comments)    Disoriented  . Ofloxacin Other (See Comments)    Disoriented  . Percocet [Oxycodone-Acetaminophen]     ROS Review of Systems    Objective:    Physical Exam  Constitutional: She is oriented to person, place, and time. She appears well-developed.  HENT:  Head: Normocephalic.  Eyes: Pupils are equal, round, and reactive to light.  Neck:  incisional scar   Cardiovascular: Normal rate, regular rhythm and normal heart sounds.  Pulmonary/Chest: Effort normal and breath sounds normal.  Abdominal: Soft.  hypoactive  Musculoskeletal:        General: Normal range of motion.     Cervical back: Normal range of motion.  Neurological: She is alert and  oriented to person, place, and time.  Skin: Skin is warm and dry.  Psychiatric: She has a normal mood and affect. Her behavior is normal. Judgment and thought content normal.    BP (!) 177/77 (BP Location: Left Arm, Patient Position: Sitting) Comment: pt out 4 days  Pulse 63   Ht '5\' 3"'  (1.6 m)   Wt 226 lb 9.6 oz (102.8 kg)   SpO2 100%   BMI 40.14 kg/m  Wt Readings from Last 3 Encounters:  05/23/19 226 lb 9.6 oz (102.8 kg)  02/12/19 202 lb (91.6 kg)  10/17/18 229 lb 6.4 oz (104.1  kg)     Health Maintenance Due  Topic Date Due  . OPHTHALMOLOGY EXAM  Never done  . PAP SMEAR-Modifier  Never done  . MAMMOGRAM  Never done  . COLONOSCOPY  Never done  . FOOT EXAM  07/12/2018  . URINE MICROALBUMIN  07/12/2018    There are no preventive care reminders to display for this patient.  Lab Results  Component Value Date   TSH 14.280 (H) 07/13/2018   Lab Results  Component Value Date   WBC 9.4 02/12/2019   HGB 13.6 02/12/2019   HCT 43.3 02/12/2019   MCV 96.0 02/12/2019   PLT 356 02/12/2019   Lab Results  Component Value Date   NA 142 02/12/2019   K 3.6 02/12/2019   CO2 27 02/12/2019   GLUCOSE 143 (H) 02/12/2019   BUN 10 02/12/2019   CREATININE 0.84 02/12/2019   BILITOT 0.7 02/12/2019   ALKPHOS 77 02/12/2019   AST 13 (L) 02/12/2019   ALT 13 02/12/2019   PROT 7.7 02/12/2019   ALBUMIN 4.3 02/12/2019   CALCIUM 9.1 02/12/2019   ANIONGAP 10 02/12/2019   Lab Results  Component Value Date   CHOL 180 05/23/2019   Lab Results  Component Value Date   HDL 37 (L) 05/23/2019   Lab Results  Component Value Date   LDLCALC 120 (H) 05/23/2019   Lab Results  Component Value Date   TRIG 127 05/23/2019   Lab Results  Component Value Date   CHOLHDL 4.9 (H) 05/23/2019   Lab Results  Component Value Date   HGBA1C 8.6 (A) 10/17/2018      Assessment & Plan:   Problem List Items Addressed This Visit      High   Controlled type 2 diabetes mellitus with complication,  without long-term current use of insulin (HCC) - Primary   Relevant Medications   Semaglutide,0.25 or 0.5MG/DOS, 2 MG/1.5ML SOPN   Other Relevant Orders   Microalbumin/Creatinine Ratio, Urine   Lipid panel (Completed)   Essential hypertension   Relevant Medications   diltiazem (DILACOR XR) 180 MG 24 hr capsule   Other Relevant Orders   POCT Urinalysis Dipstick (Completed)   Hyperlipidemia   Relevant Medications   diltiazem (DILACOR XR) 180 MG 24 hr capsule   Other Relevant Orders   Lipid panel (Completed)      Meds ordered this encounter  Medications  . diltiazem (DILACOR XR) 180 MG 24 hr capsule    Sig: Take 1 capsule (180 mg total) by mouth daily.    Dispense:  90 capsule    Refill:  0    Order Specific Question:   Supervising Provider    Answer:   Tresa Garter [id number]  . buPROPion (WELLBUTRIN SR) 150 MG 12 hr tablet    Sig: Take 1 tablet (150 mg total) by mouth 2 (two) times daily.    Dispense:  180 tablet    Refill:  0    Order Specific Question:   Supervising Provider    Answer:   Tresa Garter [[id number]]    Follow-up: Return in about 3 months (around 08/23/2019).    Vevelyn Francois, NP

## 2019-05-23 NOTE — Patient Instructions (Addendum)
Managing Your Hypertension Hypertension is commonly called high blood pressure. This is when the force of your blood pressing against the walls of your arteries is too strong. Arteries are blood vessels that carry blood from your heart throughout your body. Hypertension forces the heart to work harder to pump blood, and may cause the arteries to become narrow or stiff. Having untreated or uncontrolled hypertension can cause heart attack, stroke, kidney disease, and other problems. What are blood pressure readings? A blood pressure reading consists of a higher number over a lower number. Ideally, your blood pressure should be below 120/80. The first ("top") number is called the systolic pressure. It is a measure of the pressure in your arteries as your heart beats. The second ("bottom") number is called the diastolic pressure. It is a measure of the pressure in your arteries as the heart relaxes. What does my blood pressure reading mean? Blood pressure is classified into four stages. Based on your blood pressure reading, your health care provider may use the following stages to determine what type of treatment you need, if any. Systolic pressure and diastolic pressure are measured in a unit called mm Hg. Normal  Systolic pressure: below 120.  Diastolic pressure: below 80. Elevated  Systolic pressure: 120-129.  Diastolic pressure: below 80. Hypertension stage 1  Systolic pressure: 130-139.  Diastolic pressure: 80-89. Hypertension stage 2  Systolic pressure: 140 or above.  Diastolic pressure: 90 or above. What health risks are associated with hypertension? Managing your hypertension is an important responsibility. Uncontrolled hypertension can lead to:  A heart attack.  A stroke.  A weakened blood vessel (aneurysm).  Heart failure.  Kidney damage.  Eye damage.  Metabolic syndrome.  Memory and concentration problems. What changes can I make to manage my  hypertension? Hypertension can be managed by making lifestyle changes and possibly by taking medicines. Your health care provider will help you make a plan to bring your blood pressure within a normal range. Eating and drinking   Eat a diet that is high in fiber and potassium, and low in salt (sodium), added sugar, and fat. An example eating plan is called the DASH (Dietary Approaches to Stop Hypertension) diet. To eat this way: ? Eat plenty of fresh fruits and vegetables. Try to fill half of your plate at each meal with fruits and vegetables. ? Eat whole grains, such as whole wheat pasta, brown rice, or whole grain bread. Fill about one quarter of your plate with whole grains. ? Eat low-fat diary products. ? Avoid fatty cuts of meat, processed or cured meats, and poultry with skin. Fill about one quarter of your plate with lean proteins such as fish, chicken without skin, beans, eggs, and tofu. ? Avoid premade and processed foods. These tend to be higher in sodium, added sugar, and fat.  Reduce your daily sodium intake. Most people with hypertension should eat less than 1,500 mg of sodium a day.  Limit alcohol intake to no more than 1 drink a day for nonpregnant women and 2 drinks a day for men. One drink equals 12 oz of beer, 5 oz of wine, or 1 oz of hard liquor. Lifestyle  Work with your health care provider to maintain a healthy body weight, or to lose weight. Ask what an ideal weight is for you.  Get at least 30 minutes of exercise that causes your heart to beat faster (aerobic exercise) most days of the week. Activities may include walking, swimming, or biking.  Include exercise   to strengthen your muscles (resistance exercise), such as weight lifting, as part of your weekly exercise routine. Try to do these types of exercises for 30 minutes at least 3 days a week.  Do not use any products that contain nicotine or tobacco, such as cigarettes and e-cigarettes. If you need help quitting,  ask your health care provider.  Control any long-term (chronic) conditions you have, such as high cholesterol or diabetes. Monitoring  Monitor your blood pressure at home as told by your health care provider. Your personal target blood pressure may vary depending on your medical conditions, your age, and other factors.  Have your blood pressure checked regularly, as often as told by your health care provider. Working with your health care provider  Review all the medicines you take with your health care provider because there may be side effects or interactions.  Talk with your health care provider about your diet, exercise habits, and other lifestyle factors that may be contributing to hypertension.  Visit your health care provider regularly. Your health care provider can help you create and adjust your plan for managing hypertension. Will I need medicine to control my blood pressure? Your health care provider may prescribe medicine if lifestyle changes are not enough to get your blood pressure under control, and if:  Your systolic blood pressure is 130 or higher.  Your diastolic blood pressure is 80 or higher. Take medicines only as told by your health care provider. Follow the directions carefully. Blood pressure medicines must be taken as prescribed. The medicine does not work as well when you skip doses. Skipping doses also puts you at risk for problems. Contact a health care provider if:  You think you are having a reaction to medicines you have taken.  You have repeated (recurrent) headaches.  You feel dizzy.  You have swelling in your ankles.  You have trouble with your vision. Get help right away if:  You develop a severe headache or confusion.  You have unusual weakness or numbness, or you feel faint.  You have severe pain in your chest or abdomen.  You vomit repeatedly.  You have trouble breathing. Summary  Hypertension is when the force of blood pumping  through your arteries is too strong. If this condition is not controlled, it may put you at risk for serious complications.  Your personal target blood pressure may vary depending on your medical conditions, your age, and other factors. For most people, a normal blood pressure is less than 120/80.  Hypertension is managed by lifestyle changes, medicines, or both. Lifestyle changes include weight loss, eating a healthy, low-sodium diet, exercising more, and limiting alcohol. This information is not intended to replace advice given to you by your health care provider. Make sure you discuss any questions you have with your health care provider. Document Revised: 04/21/2018 Document Reviewed: 11/26/2015 Elsevier Patient Education  2020 [employer].  Steps to Quit Smoking Smoking tobacco is the leading cause of preventable death. It can affect almost every organ in the body. Smoking puts you and people around you at risk for many serious, long-lasting (chronic) diseases. Quitting smoking can be hard, but it is one of the best things that you can do for your health. It is never too late to quit. How do I get ready to quit? When you decide to quit smoking, make a plan to help you succeed. Before you quit:  Pick a date to quit. Set a date within the next 2 weeks to  give you time to prepare.  Write down the reasons why you are quitting. Keep this list in places where you will see it often.  Tell your family, friends, and co-workers that you are quitting. Their support is important.  Talk with your doctor about the choices that may help you quit.  Find out if your health insurance will pay for these treatments.  Know the people, places, things, and activities that make you want to smoke (triggers). Avoid them. What first steps can I take to quit smoking?  Throw away all cigarettes at home, at work, and in your car.  Throw away the things that you use when you smoke, such as ashtrays and  lighters.  Clean your car. Make sure to empty the ashtray.  Clean your home, including curtains and carpets. What can I do to help me quit smoking? Talk with your doctor about taking medicines and seeing a counselor at the same time. You are more likely to succeed when you do both.  If you are pregnant or breastfeeding, talk with your doctor about counseling or other ways to quit smoking. Do not take medicine to help you quit smoking unless your doctor tells you to do so. To quit smoking: Quit right away  Quit smoking totally, instead of slowly cutting back on how much you smoke over a period of time.  Go to counseling. You are more likely to quit if you go to counseling sessions regularly. Take medicine You may take medicines to help you quit. Some medicines need a prescription, and some you can buy over-the-counter. Some medicines may contain a drug called nicotine to replace the nicotine in cigarettes. Medicines may:  Help you to stop having the desire to smoke (cravings).  Help to stop the problems that come when you stop smoking (withdrawal symptoms). Your doctor may ask you to use:  Nicotine patches, gum, or lozenges.  Nicotine inhalers or sprays.  Non-nicotine medicine that is taken by mouth. Find resources Find resources and other ways to help you quit smoking and remain smoke-free after you quit. These resources are most helpful when you use them often. They include:  Online chats with a Social worker.  Phone quitlines.  Printed Furniture conservator/restorer.  Support groups or group counseling.  Text messaging programs.  Mobile phone apps. Use apps on your mobile phone or tablet that can help you stick to your quit plan. There are many free apps for mobile phones and tablets as well as websites. Examples include Quit Guide from the [employer] and smokefree.gov  What things can I do to make it easier to quit?   Talk to your family and friends. Ask them to support and encourage  you.  Call a phone quitline (1-800-QUIT-NOW), reach out to support groups, or work with a Social worker.  Ask people who smoke to not smoke around you.  Avoid places that make you want to smoke, such as: ? Bars. ? Parties. ? Smoke-break areas at work.  Spend time with people who do not smoke.  Lower the stress in your life. Stress can make you want to smoke. Try these things to help your stress: ? Getting regular exercise. ? Doing deep-breathing exercises. ? Doing yoga. ? Meditating. ? Doing a body scan. To do this, close your eyes, focus on one area of your body at a time from head to toe. Notice which parts of your body are tense. Try to relax the muscles in those areas. How will I feel when I  quit smoking? Day 1 to 3 weeks Within the first 24 hours, you may start to have some problems that come from quitting tobacco. These problems are very bad 2-3 days after you quit, but they do not often last for more than 2-3 weeks. You may get these symptoms:  Mood swings.  Feeling restless, nervous, angry, or annoyed.  Trouble concentrating.  Dizziness.  Strong desire for high-sugar foods and nicotine.  Weight gain.  Trouble pooping (constipation).  Feeling like you may vomit (nausea).  Coughing or a sore throat.  Changes in how the medicines that you take for other issues work in your body.  Depression.  Trouble sleeping (insomnia). Week 3 and afterward After the first 2-3 weeks of quitting, you may start to notice more positive results, such as:  Better sense of smell and taste.  Less coughing and sore throat.  Slower heart rate.  Lower blood pressure.  Clearer skin.  Better breathing.  Fewer sick days. Quitting smoking can be hard. Do not give up if you fail the first time. Some people need to try a few times before they succeed. Do your best to stick to your quit plan, and talk with your doctor if you have any questions or concerns. Summary  Smoking tobacco is  the leading cause of preventable death. Quitting smoking can be hard, but it is one of the best things that you can do for your health.  When you decide to quit smoking, make a plan to help you succeed.  Quit smoking right away, not slowly over a period of time.  When you start quitting, seek help from your doctor, family, or friends. This information is not intended to replace advice given to you by your health care provider. Make sure you discuss any questions you have with your health care provider. Document Revised: 09/22/2018 Document Reviewed: 03/18/2018 Elsevier Patient Education  [place].

## 2019-05-24 ENCOUNTER — Other Ambulatory Visit: Payer: Self-pay | Admitting: Nurse Practitioner

## 2019-05-24 LAB — LIPID PANEL
Chol/HDL Ratio: 4.9 ratio — ABNORMAL HIGH (ref 0.0–4.4)
Cholesterol, Total: 180 mg/dL (ref 100–199)
HDL: 37 mg/dL — ABNORMAL LOW (ref >39)
LDL Chol Calc (NIH): 120 mg/dL — ABNORMAL HIGH (ref 0–99)
Triglycerides: 127 mg/dL (ref 0–149)
VLDL Cholesterol Cal: 23 mg/dL (ref 5–40)

## 2019-05-24 LAB — MICROALBUMIN / CREATININE URINE RATIO
Creatinine, Urine: 43.9 mg/dL
Microalb/Creat Ratio: 36 mg/g creat — ABNORMAL HIGH (ref 0–29)
Microalbumin, Urine: 15.7 ug/mL

## 2019-06-03 ENCOUNTER — Emergency Department (HOSPITAL_COMMUNITY): Payer: 59

## 2019-06-03 ENCOUNTER — Emergency Department (HOSPITAL_COMMUNITY)
Admission: EM | Admit: 2019-06-03 | Discharge: 2019-06-03 | Discharge status: Against Medical Advice | Payer: 59 | Attending: Emergency Medicine | Admitting: Emergency Medicine

## 2019-06-03 ENCOUNTER — Encounter (HOSPITAL_COMMUNITY): Payer: Self-pay | Admitting: Emergency Medicine

## 2019-06-03 ENCOUNTER — Other Ambulatory Visit: Payer: Self-pay

## 2019-06-03 DIAGNOSIS — R0789 Other chest pain: Secondary | ICD-10-CM | POA: Diagnosis not present

## 2019-06-03 DIAGNOSIS — Z85850 Personal history of malignant neoplasm of thyroid: Secondary | ICD-10-CM | POA: Insufficient documentation

## 2019-06-03 DIAGNOSIS — M542 Cervicalgia: Secondary | ICD-10-CM | POA: Diagnosis present

## 2019-06-03 DIAGNOSIS — Z5320 Procedure and treatment not carried out because of patient's decision for unspecified reasons: Secondary | ICD-10-CM | POA: Diagnosis not present

## 2019-06-03 LAB — BASIC METABOLIC PANEL
Anion gap: 12 (ref 5–15)
BUN: 11 mg/dL (ref 6–20)
CO2: 26 mmol/L (ref 22–32)
Calcium: 9.4 mg/dL (ref 8.9–10.3)
Chloride: 103 mmol/L (ref 98–111)
Creatinine, Ser: 0.91 mg/dL (ref 0.44–1.00)
GFR calc Af Amer: >60 mL/min (ref >60)
GFR calc non Af Amer: >60 mL/min (ref >60)
Glucose, Bld: 104 mg/dL — ABNORMAL HIGH (ref 70–99)
Potassium: 3.6 mmol/L (ref 3.5–5.1)
Sodium: 141 mmol/L (ref 135–145)

## 2019-06-03 LAB — CBC
HCT: 43.1 % (ref 36.0–46.0)
Hemoglobin: 13.8 g/dL (ref 12.0–15.0)
MCH: 30.3 pg (ref 26.0–34.0)
MCHC: 32 g/dL (ref 30.0–36.0)
MCV: 94.5 fL (ref 80.0–100.0)
Platelets: 372 10*3/uL (ref 150–400)
RBC: 4.56 MIL/uL (ref 3.87–5.11)
RDW: 13.2 % (ref 11.5–15.5)
WBC: 8.8 10*3/uL (ref 4.0–10.5)
nRBC: 0 % (ref 0.0–0.2)

## 2019-06-03 LAB — TROPONIN I (HIGH SENSITIVITY): Troponin I (High Sensitivity): 4 ng/L (ref <18)

## 2019-06-03 LAB — I-STAT BETA HCG BLOOD, ED (MC, WL, AP ONLY): I-stat hCG, quantitative: <5 m[IU]/mL (ref <5)

## 2019-06-03 MED ORDER — SODIUM CHLORIDE 0.9% FLUSH: 3.0000 mL | Freq: Once | INTRAVENOUS | Status: DC

## 2019-06-03 NOTE — ED Provider Notes (Signed)
[facility] EMERGENCY DEPARTMENT Provider Note   CSN: [id number] Arrival date & time: 06/03/19  1517     History Chief Complaint  Patient presents with  . Neck Pain  . Chest Pain    Cynthia Patton is a 59 y.o. female right side neck pain.  She has past history of type 2 diabetes, hypertension, hyperlipidemia, status post thyroidectomy secondary to thyroid cancer.  She states that she is having pain in the right shoulder and right side of the neck into her occiput.  She states she has a lot of pain when she lies back on the bed.  Range of motion is not limited.  She states that it feels pain.  Is worse with lateral rotation towards the left shoulder.  She has not tried anything for pain relief.  She states she had something similar to this and it was when she "had a sinus infection that settled in her neck."  She denies upper extremity numbness or weakness.  HPI     Past Medical History:  Diagnosis Date  . Diabetes mellitus without complication ([place])   . Hypertension   . Thyroid disease     Patient Active Problem List   Diagnosis Date Noted  . Hemoglobin A1C between 7% and 9% indicating borderline diabetic control 10/18/2018  . Positive ANA (antinuclear antibody) 04/06/2016  . Hyperlipidemia 03/19/2016  . Controlled type 2 diabetes mellitus with complication, without long-term current use of insulin ([place]) 02/09/2016  . Essential hypertension 02/09/2016  . Health care maintenance 02/24/2015    Past Surgical History:  Procedure Laterality Date  . ABDOMINAL HYSTERECTOMY    . CHOLECYSTECTOMY    . INCISE AND DRAIN ABCESS     on right leg      OB History    Gravida  0   Para  0   Term  0   Preterm  0   AB  0   Living  0     SAB  0   TAB  0   Ectopic  0   Multiple  0   Live Births  0           Family History  Problem Relation Age of Onset  . Kidney disease Mother   . Hypertension Mother   . Hypertension Sister   . Heart disease  Brother     Social History   Tobacco Use  . Smoking status: Current Every Day Smoker    Packs/day: 0.50    Types: Cigarettes  . Smokeless tobacco: Never Used  Substance Use Topics  . Alcohol use: No  . Drug use: No    Home Medications Prior to Admission medications   Medication Sig Start Date End Date Taking? Authorizing Provider  albuterol (PROVENTIL HFA;VENTOLIN HFA) 108 (90 Base) MCG/ACT inhaler Inhale 2 puffs into the lungs every 4 (four) hours as needed for wheezing or shortness of breath. 02/16/18   Lanae Boast, FNP  aspirin EC 81 MG tablet Take 1 tablet (81 mg total) by mouth daily. 03/19/16   Dorena Dew, FNP  blood glucose meter kit and supplies KIT Dispense based on patient and insurance preference. Use up to four times daily as directed. (FOR ICD-9 250.00, 250.01). 07/07/18   Lanae Boast, FNP  buPROPion [facility] SR) 150 MG 12 hr tablet Take 1 tablet (150 mg total) by mouth 2 (two) times daily. 05/23/19 08/21/19  Vevelyn Francois, NP  diltiazem (DILACOR XR) 180 MG 24 hr capsule Take  1 capsule (180 mg total) by mouth daily. 05/23/19 08/21/19  Vevelyn Francois, NP  ibuprofen (ADVIL) 800 MG tablet Take 1 tablet (800 mg total) by mouth every 8 (eight) hours as needed. 02/20/19   Azzie Glatter, FNP  levothyroxine (LEVOXYL) 175 MCG tablet Take 1 tablet (175 mcg total) by mouth daily before breakfast. 01/16/19   Azzie Glatter, FNP  mometasone (NASONEX) 50 MCG/ACT nasal spray Place 2 sprays into the nose daily. Patient taking differently: Place 2 sprays into the nose daily as needed (nasal congestion).  10/14/16   Dorena Dew, FNP  ONETOUCH VERIO test strip USE UP TO 4 TIMES DAILY AS DIRECTED 10/22/18   [provider]  Semaglutide,0.25 or 0.5MG/DOS, 2 MG/1.5ML SOPN Use 0.25 mg once weekly for 4 weeks, then increase to 0.5 mg once weekly 04/09/19   [provider]    Allergies    Floxin [ocuflox], Ofloxacin, and Percocet  [oxycodone-acetaminophen]  Review of Systems   Review of Systems Ten systems reviewed and are negative for acute change, except as noted in the HPI.   Physical Exam Updated Vital Signs BP (!) 160/74 (BP Location: Left Arm)   Pulse 81   Temp 98.5 F (36.9 C) (Oral)   Resp 16   Ht '5\' 4"'  (1.626 m)   Wt 98 kg   SpO2 100%   BMI 37.08 kg/m   Physical Exam Vitals and nursing note reviewed.  Constitutional:      General: She is not in acute distress.    Appearance: She is well-developed. She is not diaphoretic.  HENT:     Head: Normocephalic and atraumatic.  Eyes:     General: No scleral icterus.    Conjunctiva/sclera: Conjunctivae normal.  Neck:   Cardiovascular:     Rate and Rhythm: Normal rate and regular rhythm.     Heart sounds: Normal heart sounds. No murmur. No friction rub. No gallop.   Pulmonary:     Effort: Pulmonary effort is normal. No respiratory distress.     Breath sounds: Normal breath sounds.  Abdominal:     General: Bowel sounds are normal. There is no distension.     Palpations: Abdomen is soft. There is no mass.     Tenderness: There is no abdominal tenderness. There is no guarding.  Musculoskeletal:     Cervical back: Normal range of motion. Muscular tenderness present. No spinous process tenderness. Normal range of motion.  Skin:    General: Skin is warm and dry.  Neurological:     Mental Status: She is alert and oriented to person, place, and time.  Psychiatric:        Behavior: Behavior normal.     ED Results / Procedures / Treatments   Labs (all labs ordered are listed, but only abnormal results are displayed) Labs Reviewed  BASIC METABOLIC PANEL - Abnormal; Notable for the following components:      Result Value   Glucose, Bld 104 (*)    All other components within normal limits  CBC  I-STAT BETA HCG BLOOD, ED (MC, [facility], [facility] ONLY)  TROPONIN I (HIGH SENSITIVITY)    EKG EKG Interpretation  Date/Time:  Sunday Jun 03 2019 15:57:32  EDT Ventricular Rate:  79 PR Interval:  166 QRS Duration: 88 QT Interval:  364 QTC Calculation: 417 R Axis:   58 Text Interpretation: Normal sinus rhythm Normal ECG No significant change since last tracing Confirmed by Isla Pence [phone]) on 06/03/2019 6:16:07 PM   Radiology  DG Chest 2 View  Result Date: 06/03/2019 CLINICAL DATA:  Chest pain EXAM: CHEST - 2 VIEW COMPARISON:  02/12/2019 FINDINGS: Cardiac shadow is stable. Aortic calcifications are noted. The lungs are clear bilaterally. No bony abnormality is noted. IMPRESSION: No acute abnormality noted. Electronically Signed   By: Inez Catalina M.D.   On: 06/03/2019 16:33    Procedures Procedures (including critical care time)  Medications Ordered in ED Medications  sodium chloride flush (NS) 0.9 % injection 3 mL (has no administration in time range)    ED Course  I have reviewed the triage vital signs and the nursing notes.  Pertinent labs & imaging results that were available during my care of the patient were reviewed by me and considered in my medical decision making (see chart for details).    MDM Rules/Calculators/A&P                     Patient here with neck pain.  Plan to get CT scan, she does have a history of thyroid cancer.  Patient eloped prior to evaluation.  Final Clinical Impression(s) / ED Diagnoses Final diagnoses:  None    Rx / DC Orders ED Discharge Orders    None       Margarita Mail, PA-C 06/03/19 2143    Isla Pence, MD 06/03/19 2325

## 2019-06-03 NOTE — ED Notes (Signed)
Patient request that blood be drawn in exam room.

## 2019-06-03 NOTE — ED Triage Notes (Signed)
C/o throbbing R sided neck pain and head pain x 2 days.  Also reports intermittent pain to center of chest and L hand tingling x 2 days.  No arm drift.

## 2019-06-04 ENCOUNTER — Encounter: Payer: Self-pay | Admitting: Nurse Practitioner

## 2019-06-04 ENCOUNTER — Ambulatory Visit (INDEPENDENT_AMBULATORY_CARE_PROVIDER_SITE_OTHER): Payer: 59 | Admitting: Nurse Practitioner

## 2019-06-04 VITALS — BP 146/67 | HR 98 | Temp 98.5°F | Ht 64.0 in | Wt 217.6 lb

## 2019-06-04 DIAGNOSIS — R519 Headache, unspecified: Secondary | ICD-10-CM | POA: Diagnosis not present

## 2019-06-04 DIAGNOSIS — G4489 Other headache syndrome: Secondary | ICD-10-CM

## 2019-06-04 DIAGNOSIS — M542 Cervicalgia: Secondary | ICD-10-CM | POA: Diagnosis not present

## 2019-06-04 NOTE — Progress Notes (Signed)
[address], [place]  [address] Phone:  [phone]   Fax:  [phone]   Established Patient Office Visit  Subjective:  Patient ID: Cynthia Patton, female    DOB: [birth year]  Age: 59 y.o. MRN: [id number]  CC:  Chief Complaint  Patient presents with  . Neck Pain    right side neck pain for 4 days  . Hospitalization Follow-up    ED 06/03/2019 for Neck Pain    HPI Cynthia Patton presents for neck pain. She  has a past medical history of Diabetes mellitus without complication ([place]), Hypertension, and Thyroid disease.   Neck Pain Patient complains of neck pain. She states that she has been having headache that radiates up to the top of head and into the back of her head. She admits that it is unusual for her to have headaches. She was seen in the ER on yesterday however she left before she could have her CT scan completed. Event that precipitated these symptoms: none known. Onset of symptoms 3-4 days ago, and have been gradually worsening since that time. Current symptoms are pain in neck (aching, shooting and throbbing in character; 5/10 in severity), paresthesias in in her hands and stiffness in neck. Patient denies weakness in upper extremities. Patient has had no prior neck problems. Previous treatments include: medication: analgesic: APAP 107m taken last night. She admits that broght the pain down.  She has a history of Thyroid cancer and is very concern with what maybe going on. She admits that she had previous sinus infection that had settled in her neck. Denies fever, chills, head or chest congestion or nasal drainage shortness of breath, dyspnea on exertion, chest pain, nausea, vomiting or any edema.    Past Medical History:  Diagnosis Date  . [place]   . Hypertension   . Thyroid disease     Past Surgical History:  Procedure Laterality Date  . ABDOMINAL HYSTERECTOMY    . CHOLECYSTECTOMY    .  INCISE AND DRAIN ABCESS     on right leg     Family History  Problem Relation Age of Onset  . Kidney disease Mother   . Hypertension Mother   . Hypertension Sister   . Heart disease Brother     Social History   Socioeconomic History  . Marital status: Single    Spouse name: Not on file  . Number of children: Not on file  . Years of education: Not on file  . Highest education level: Not on file  Occupational History  . Not on file  Tobacco Use  . Smoking status: Current Every Day Smoker    Packs/day: 0.50    Types: Cigarettes  . Smokeless tobacco: Never Used  Substance and Sexual Activity  . Alcohol use: No  . Drug use: No  . Sexual activity: Not Currently  Other Topics Concern  . Not on file  Social History Narrative  . Not on file   Social Determinants of Health   Financial Resource Strain:   . Difficulty of Paying Living Expenses:   Food Insecurity:   . Worried About RCharity fundraiserin the Last Year:   . RArboriculturistin the Last Year:   Transportation Needs:   . LFilm/video editor(Medical):   .Marland KitchenLack of Transportation (Non-Medical):   Physical Activity:   . Days of Exercise per Week:   . Minutes of  Exercise per Session:   Stress:   . Feeling of Stress :   Social Connections:   . Frequency of Communication with Friends and Family:   . Frequency of Social Gatherings with Friends and Family:   . Attends Religious Services:   . Active Member of Clubs or Organizations:   . Attends Archivist Meetings:   Marland Kitchen Marital Status:   Intimate Partner Violence:   . Fear of Current or Ex-Partner:   . Emotionally Abused:   Marland Kitchen Physically Abused:   . Sexually Abused:     Outpatient Medications Prior to Visit  Medication Sig Dispense Refill  . albuterol (PROVENTIL HFA;VENTOLIN HFA) 108 (90 Base) MCG/ACT inhaler Inhale 2 puffs into the lungs every 4 (four) hours as needed for wheezing or shortness of breath. 1 Inhaler 1  . aspirin EC 81 MG tablet  Take 1 tablet (81 mg total) by mouth daily. 30 tablet 11  . blood glucose meter kit and supplies KIT Dispense based on patient and insurance preference. Use up to four times daily as directed. (FOR ICD-9 250.00, 250.01). 1 each 0  . buPROPion (WELLBUTRIN SR) 150 MG 12 hr tablet Take 1 tablet (150 mg total) by mouth 2 (two) times daily. 180 tablet 0  . diltiazem (DILACOR XR) 180 MG 24 hr capsule Take 1 capsule (180 mg total) by mouth daily. 90 capsule 0  . ibuprofen (ADVIL) 800 MG tablet Take 1 tablet (800 mg total) by mouth every 8 (eight) hours as needed. 30 tablet 3  . levothyroxine (LEVOXYL) 175 MCG tablet Take 1 tablet (175 mcg total) by mouth daily before breakfast. 30 tablet 3  . mometasone (NASONEX) 50 MCG/ACT nasal spray Place 2 sprays into the nose daily. (Patient taking differently: Place 2 sprays into the nose daily as needed (nasal congestion). ) 17 g 1  . ONETOUCH VERIO test strip USE UP TO 4 TIMES DAILY AS DIRECTED    . Semaglutide,0.25 or 0.5MG/DOS, 2 MG/1.5ML SOPN Use 0.25 mg once weekly for 4 weeks, then increase to 0.5 mg once weekly     No facility-administered medications prior to visit.    Allergies  Allergen Reactions  . Floxin [Ocuflox] Other (See Comments)    Disoriented  . Ofloxacin Other (See Comments)    Disoriented  . Percocet [Oxycodone-Acetaminophen]     ROS Review of Systems    Objective:    Physical Exam  Constitutional: She is oriented to person, place, and time. She appears well-developed. She appears distressed.  HENT:  Left sided facial swelling  Eyes: Pupils are equal, round, and reactive to light. Conjunctivae and EOM are normal. Right eye exhibits no discharge. Left eye exhibits no discharge. No scleral icterus.  Neck:    Guarding unable to palpate completely due to the pain  Cardiovascular: Normal rate, regular rhythm, normal heart sounds and intact distal pulses.  Pulmonary/Chest: Effort normal and breath sounds normal.  Musculoskeletal:      Cervical back: Muscular tenderness present. Decreased range of motion.  Neurological: She is alert and oriented to person, place, and time. No cranial nerve deficit.  Skin: Skin is warm and dry.  Psychiatric: She has a normal mood and affect. Her behavior is normal. Judgment and thought content normal.    BP (!) 146/67   Pulse 98   Temp 98.5 F (36.9 C)   Ht _0  (1.626 m)   Wt 217 lb 9.6 oz (98.7 kg)   SpO2 97%   BMI 37.35 kg/m  Wt Readings from Last 3 Encounters:  06/04/19 217 lb 9.6 oz (98.7 kg)  06/03/19 216 lb (98 kg)  05/23/19 226 lb 9.6 oz (102.8 kg)     Health Maintenance Due  Topic Date Due  . OPHTHALMOLOGY EXAM  Never done  . PAP SMEAR-Modifier  Never done  . MAMMOGRAM  Never done  . COLONOSCOPY  Never done  . FOOT EXAM  07/12/2018    There are no preventive care reminders to display for this patient.  Lab Results  Component Value Date   TSH 14.280 (H) 07/13/2018   Lab Results  Component Value Date   WBC 8.8 06/03/2019   HGB 13.8 06/03/2019   HCT 43.1 06/03/2019   MCV 94.5 06/03/2019   PLT 372 06/03/2019   Lab Results  Component Value Date   NA 141 06/03/2019   K 3.6 06/03/2019   CO2 26 06/03/2019   GLUCOSE 104 (H) 06/03/2019   BUN 11 06/03/2019   CREATININE 0.91 06/03/2019   BILITOT 0.7 02/12/2019   ALKPHOS 77 02/12/2019   AST 13 (L) 02/12/2019   ALT 13 02/12/2019   PROT 7.7 02/12/2019   ALBUMIN 4.3 02/12/2019   CALCIUM 9.4 06/03/2019   ANIONGAP 12 06/03/2019   Lab Results  Component Value Date   CHOL 180 05/23/2019   Lab Results  Component Value Date   HDL 37 (L) 05/23/2019   Lab Results  Component Value Date   LDLCALC 120 (H) 05/23/2019   Lab Results  Component Value Date   TRIG 127 05/23/2019   Lab Results  Component Value Date   CHOLHDL 4.9 (H) 05/23/2019   Lab Results  Component Value Date   HGBA1C 8.6 (A) 10/17/2018      Assessment & Plan:   Problem List Items Addressed This Visit    None    Visit  Diagnoses    Neck pain, acute    -  Primary   Relevant Orders   CT CERVICAL SPINE W WO CONTRAST   Occipital headache       Relevant Orders   CT CERVICAL SPINE W WO CONTRAST   Pressure in head       Relevant Orders   CT CERVICAL SPINE W WO CONTRAST   Other headache syndrome       Relevant Orders   CT CERVICAL SPINE W WO CONTRAST      No orders of the defined types were placed in this encounter.   Follow-up: Return for Appointment As Scheduled.    Vevelyn Francois, NP

## 2019-06-07 ENCOUNTER — Telehealth: Payer: Self-pay

## 2019-06-07 NOTE — Telephone Encounter (Signed)
On 5/26/2021[name] CT has been denied by [place].  Per insurance she must fail 6 weeks of out-paitent treatment. The it may be resubmitted for review again.  Treatment may involve:   1. Medication for swelling and pain. 2. Physical Therapy. 3. Therapeutic injection  Crystal please call [name] back. She is not happy with this message and would like to talk to you. Per patient she has an issue with me. Someone told her the scan was approved.

## 2019-06-08 ENCOUNTER — Other Ambulatory Visit: Payer: Self-pay | Admitting: Nurse Practitioner

## 2019-06-08 ENCOUNTER — Telehealth: Payer: Self-pay

## 2019-06-08 MED ORDER — TRAMADOL HCL 50 MG PO TABS: 50.0000 mg | ORAL_TABLET | Freq: Three times a day (TID) | ORAL | 0 refills | Status: AC | PRN

## 2019-06-08 NOTE — Telephone Encounter (Signed)
Called and left a message for return call.

## 2019-06-08 NOTE — Progress Notes (Signed)
Spoke with Cynthia Patton. She informed me that she had spoken with Dr Elenore Rota office; endocrinologist at [facility]. She was going to have a Soft tissues scan of her head and neck which was less expensive than the CTscan. However while on the phone she received a call from their office and was told to wait on any additional testing until her apt at the end of June.  She was upset and stated that she has been dealing with this issue since March and does not feel like she can wait until June for further evaluation. She is very concern that there is something serious going on because the pain is shooting up into her head. She has continued to use APAP. She admits that the pain is better than it was on Sunday however it is constant. She does continue to smoke and admits that at this time she has to smoke.  She requested some additional medication for pain.  I did encourage her to return to the ER if the symptoms persist or worsen. I also informed her that I would try to to complete a Peer to peer with her insurance company.

## 2019-06-08 NOTE — Telephone Encounter (Signed)
Task completed by Dionisio David NP

## 2019-07-27 ENCOUNTER — Encounter: Payer: Self-pay | Admitting: Nurse Practitioner

## 2019-07-27 ENCOUNTER — Ambulatory Visit (INDEPENDENT_AMBULATORY_CARE_PROVIDER_SITE_OTHER): Payer: 59 | Admitting: Nurse Practitioner

## 2019-07-27 ENCOUNTER — Encounter: Payer: Self-pay | Admitting: Gastroenterology

## 2019-07-27 ENCOUNTER — Other Ambulatory Visit: Payer: Self-pay

## 2019-07-27 VITALS — BP 148/64 | HR 95 | Temp 97.2°F | Ht 64.0 in | Wt 214.0 lb

## 2019-07-27 DIAGNOSIS — Z0000 Encounter for general adult medical examination without abnormal findings: Secondary | ICD-10-CM | POA: Diagnosis not present

## 2019-07-27 DIAGNOSIS — E782 Mixed hyperlipidemia: Secondary | ICD-10-CM

## 2019-07-27 DIAGNOSIS — H35031 Hypertensive retinopathy, right eye: Secondary | ICD-10-CM

## 2019-07-27 DIAGNOSIS — R630 Anorexia: Secondary | ICD-10-CM | POA: Diagnosis not present

## 2019-07-27 DIAGNOSIS — E118 Type 2 diabetes mellitus with unspecified complications: Secondary | ICD-10-CM

## 2019-07-27 DIAGNOSIS — I10 Essential (primary) hypertension: Secondary | ICD-10-CM | POA: Diagnosis not present

## 2019-07-27 NOTE — Progress Notes (Signed)
[place], [place]  [address] Phone:  [phone]   Fax:  [phone]   Established Patient Office Visit  Subjective:  Patient ID: Cynthia Patton, female    DOB: 1960/02/17  Age: 59 y.o. MRN: [id number]  CC:  Chief Complaint  Patient presents with  . Follow-up    3 month follow up, no concerns.    HPI Cynthia Patton presents for follow up. She  has a past medical history of Diabetes mellitus without complication ([place]), Hypertension, and Thyroid disease.  She admits that she was seen by eye doctor and was told that she has bleeding behind the right eye.  She further expressed that this was related to her hypertension and not her diabetes.  She was seen by endocrinology and will start on losartan.  Hypertension Patient is here for follow-up of elevated blood pressure. She is not exercising and is adherent to a low-salt diet.  She admits that she is not eating well and has lost inches.  She feels like this is related to the semaglutide that she is using for her diabetes.  Her current A1c is 6.8%.  She does not monitor her blood glucose at home.  Blood pressure is not monitored at home. Cardiac symptoms: fatigue. Patient denies chest pain, dyspnea, irregular heart beat, palpitations and syncope. Cardiovascular risk factors: diabetes mellitus, hypertension, microalbuminuria, obesity (BMI >= 30 kg/m2) and sedentary lifestyle. Use of agents associated with hypertension: thyroid hormones. History of target organ damage: retinopathy.     Past Medical History:  Diagnosis Date  . Diabetes mellitus without complication ([place])   . Hypertension   . Thyroid disease     Past Surgical History:  Procedure Laterality Date  . ABDOMINAL HYSTERECTOMY    . CHOLECYSTECTOMY    . INCISE AND DRAIN ABCESS     on right leg     Family History  Problem Relation Age of Onset  . Kidney disease Mother   . Hypertension Mother   . Hypertension Sister   . Heart  disease Brother     Social History   Socioeconomic History  . Marital status: Single    Spouse name: Not on file  . Number of children: Not on file  . Years of education: Not on file  . Highest education level: Not on file  Occupational History  . Not on file  Tobacco Use  . Smoking status: Current Every Day Smoker    Packs/day: 0.50    Types: Cigarettes  . Smokeless tobacco: Never Used  Vaping Use  . Vaping Use: Never used  Substance and Sexual Activity  . Alcohol use: No  . Drug use: No  . Sexual activity: Not Currently  Other Topics Concern  . Not on file  Social History Narrative  . Not on file   Social Determinants of Health   Financial Resource Strain:   . Difficulty of Paying Living Expenses:   Food Insecurity:   . Worried About Charity fundraiser in the Last Year:   . Arboriculturist in the Last Year:   Transportation Needs:   . Film/video editor (Medical):   Marland Kitchen Lack of Transportation (Non-Medical):   Physical Activity:   . Days of Exercise per Week:   . Minutes of Exercise per Session:   Stress:   . Feeling of Stress :   Social Connections:   . Frequency of Communication with Friends and Family:   .  Frequency of Social Gatherings with Friends and Family:   . Attends Religious Services:   . Active Member of Clubs or Organizations:   . Attends Archivist Meetings:   Marland Kitchen Marital Status:   Intimate Partner Violence:   . Fear of Current or Ex-Partner:   . Emotionally Abused:   Marland Kitchen Physically Abused:   . Sexually Abused:     Outpatient Medications Prior to Visit  Medication Sig Dispense Refill  . albuterol (PROVENTIL HFA;VENTOLIN HFA) 108 (90 Base) MCG/ACT inhaler Inhale 2 puffs into the lungs every 4 (four) hours as needed for wheezing or shortness of breath. 1 Inhaler 1  . aspirin EC 81 MG tablet Take 1 tablet (81 mg total) by mouth daily. 30 tablet 11  . blood glucose meter kit and supplies KIT Dispense based on patient and insurance  preference. Use up to four times daily as directed. (FOR ICD-9 250.00, 250.01). 1 each 0  . buPROPion (WELLBUTRIN SR) 150 MG 12 hr tablet Take 1 tablet (150 mg total) by mouth 2 (two) times daily. 180 tablet 0  . diltiazem (DILACOR XR) 180 MG 24 hr capsule Take 1 capsule (180 mg total) by mouth daily. 90 capsule 0  . ibuprofen (ADVIL) 800 MG tablet Take 1 tablet (800 mg total) by mouth every 8 (eight) hours as needed. 30 tablet 3  . levothyroxine (LEVOXYL) 175 MCG tablet Take 1 tablet (175 mcg total) by mouth daily before breakfast. 30 tablet 3  . mometasone (NASONEX) 50 MCG/ACT nasal spray Place 2 sprays into the nose daily. (Patient taking differently: Place 2 sprays into the nose daily as needed (nasal congestion). ) 17 g 1  . ONETOUCH VERIO test strip USE UP TO 4 TIMES DAILY AS DIRECTED    . Semaglutide,0.25 or 0.5MG/DOS, 2 MG/1.5ML SOPN Use 0.25 mg once weekly for 4 weeks, then increase to 0.5 mg once weekly    . losartan (COZAAR) 50 MG tablet Take 50 mg by mouth daily.     No facility-administered medications prior to visit.    Allergies  Allergen Reactions  . Floxin [Ocuflox] Other (See Comments)    Disoriented  . Ofloxacin Other (See Comments)    Disoriented  . Percocet [Oxycodone-Acetaminophen]     ROS Review of Systems  All other systems reviewed and are negative.     Objective:    Physical Exam Constitutional:      General: She is not in acute distress.    Appearance: She is obese.  HENT:     Head: Normocephalic and atraumatic.     Right Ear: Tympanic membrane normal.     Left Ear: Tympanic membrane normal.     Nose: Nose normal.     Mouth/Throat:     Mouth: Mucous membranes are moist.  Cardiovascular:     Rate and Rhythm: Normal rate and regular rhythm.     Pulses: Normal pulses.     Heart sounds: Normal heart sounds.  Pulmonary:     Effort: Pulmonary effort is normal.     Breath sounds: Normal breath sounds.  Abdominal:     General: Bowel sounds are  normal.  Musculoskeletal:        General: Normal range of motion.     Cervical back: Normal range of motion.  Skin:    General: Skin is warm and dry.     Capillary Refill: Capillary refill takes less than 2 seconds.  Neurological:     General: No focal deficit present.  Mental Status: She is alert and oriented to person, place, and time.  Psychiatric:        Thought Content: Thought content normal.        Judgment: Judgment normal.     BP (!) 148/64   Pulse 95   Temp (!) 97.2 F (36.2 C)   Ht '5\' 4"'  (1.626 m)   Wt 214 lb (97.1 kg)   SpO2 100%   BMI 36.73 kg/m  Wt Readings from Last 3 Encounters:  07/27/19 214 lb (97.1 kg)  06/04/19 217 lb 9.6 oz (98.7 kg)  06/03/19 216 lb (98 kg)     Health Maintenance Due  Topic Date Due  . OPHTHALMOLOGY EXAM  Never done  . PAP SMEAR-Modifier  Never done  . MAMMOGRAM  Never done  . COLONOSCOPY  Never done  . FOOT EXAM  07/12/2018    There are no preventive care reminders to display for this patient.  Lab Results  Component Value Date   TSH 14.280 (H) 07/13/2018   Lab Results  Component Value Date   WBC 9.7 07/27/2019   HGB 12.9 07/27/2019   HCT 38.8 07/27/2019   MCV 92 07/27/2019   PLT 356 07/27/2019   Lab Results  Component Value Date   NA 142 07/27/2019   K 4.0 07/27/2019   CO2 26 06/03/2019   GLUCOSE 106 (H) 07/27/2019   BUN 10 07/27/2019   CREATININE 0.89 07/27/2019   BILITOT 0.4 07/27/2019   ALKPHOS 94 07/27/2019   AST 10 07/27/2019   ALT 13 02/12/2019   PROT 7.2 07/27/2019   ALBUMIN 4.3 07/27/2019   CALCIUM 9.3 07/27/2019   ANIONGAP 12 06/03/2019   Lab Results  Component Value Date   CHOL 171 07/27/2019   Lab Results  Component Value Date   HDL 37 (L) 07/27/2019   Lab Results  Component Value Date   LDLCALC 113 (H) 07/27/2019   Lab Results  Component Value Date   TRIG 113 07/27/2019   Lab Results  Component Value Date   CHOLHDL 4.6 (H) 07/27/2019   Lab Results  Component Value Date     HGBA1C 8.6 (A) 10/17/2018      Assessment & Plan:   Problem List Items Addressed This Visit      Cardiovascular and Mediastinum   Essential hypertension - Primary   Relevant Medications   losartan (COZAAR) 50 MG tablet Continue current regimen Follow-up with eye specialist as directed Encourage lifestyle modifications diet and exercise for weight loss   Other Relevant Orders   Comp. Metabolic Panel (12) (Completed)     Endocrine   Controlled type 2 diabetes mellitus with complication, without long-term current use of insulin (HCC)   Relevant Medications   losartan (COZAAR) 50 MG tablet Encourage patient to follow-up with endocrinology for further evaluation with current regimen due to  of appetite and not feeling well overall.     Other   Hyperlipidemia   Relevant Medications   losartan (COZAAR) 50 MG tablet   Other Relevant Orders   Lipid panel (Completed)    Other Visit Diagnoses    Decreased appetite       Relevant Orders   CBC with Differential/Platelet (Completed)   Healthcare maintenance       Relevant Orders   Ambulatory referral to Gastroenterology   Hypertensive retinopathy of right eye     Encourage BP monitoring outside of office Follow-up with eye care specialists as directed       No orders  of the defined types were placed in this encounter.   Follow-up: Return in about 3 months (around 10/27/2019) for please schedule well woman physical in about 3 mon (PAP).    Vevelyn Francois, NP

## 2019-07-27 NOTE — Patient Instructions (Signed)
Breast Screening Issues for Women With Breast Implants  Regular breast screening is an important part of health care for women. Breast screening is usually done by mammogram. A mammogram is a procedure in which an X-ray image of the breast is taken. Abnormalities in the breast, such as growths (tumors) or growths that contain fluid or air (cysts), can be found with a mammogram. Breast implants can make mammograms difficult to do. The implants may affect the result of the mammogram or the evaluation of the results. Issues with mammograms for women with implants  The implant can hide or obscure breast tumors.  The implant, or scar tissue that has formed around the breast implant, may make it difficult to press the breast firmly enough during the exam.  Reading of the mammogram by the radiologist may be difficult because the implant can make it hard to see the underlying breast tissue.  The implant may break open during a mammogram (rare).  Fear that the implant may break during a mammogram may discourage women from having the test.  Extra pictures may need to be taken during a mammogram to provide a better view of breast tissue. Alternative screening tests for women with breast implants Other tests that can be done for women with breast implants include:  MRI. An MRI allows your health care provider to see through your implants to the breast tissue and chest muscles. MRI is an excellent technique for detecting broken or leaking implants.  Ultrasound. This uses sound waves to detect broken and leaking implants. An exam by your health care provider should be done yearly. This information is not intended to replace advice given to you by your health care provider. Make sure you discuss any questions you have with your health care provider. Document Revised: 08/03/2016 Document Reviewed: 09/18/2015 Elsevier Patient Education  2020 Elsevier Inc.  

## 2019-07-28 LAB — CBC WITH DIFFERENTIAL/PLATELET
Basophils Absolute: 0 10*3/uL (ref 0.0–0.2)
Basos: 0 %
EOS (ABSOLUTE): 0.1 10*3/uL (ref 0.0–0.4)
Eos: 1 %
Hematocrit: 38.8 % (ref 34.0–46.6)
Hemoglobin: 12.9 g/dL (ref 11.1–15.9)
Immature Grans (Abs): 0 10*3/uL (ref 0.0–0.1)
Immature Granulocytes: 0 %
Lymphocytes Absolute: 2.5 10*3/uL (ref 0.7–3.1)
Lymphs: 25 %
MCH: 30.5 pg (ref 26.6–33.0)
MCHC: 33.2 g/dL (ref 31.5–35.7)
MCV: 92 fL (ref 79–97)
Monocytes Absolute: 0.5 10*3/uL (ref 0.1–0.9)
Monocytes: 5 %
Neutrophils Absolute: 6.7 10*3/uL (ref 1.4–7.0)
Neutrophils: 69 %
Platelets: 356 10*3/uL (ref 150–450)
RBC: 4.23 x10E6/uL (ref 3.77–5.28)
RDW: 12.6 % (ref 11.7–15.4)
WBC: 9.7 10*3/uL (ref 3.4–10.8)

## 2019-07-28 LAB — LIPID PANEL
Chol/HDL Ratio: 4.6 ratio — ABNORMAL HIGH (ref 0.0–4.4)
Cholesterol, Total: 171 mg/dL (ref 100–199)
HDL: 37 mg/dL — ABNORMAL LOW (ref >39)
LDL Chol Calc (NIH): 113 mg/dL — ABNORMAL HIGH (ref 0–99)
Triglycerides: 113 mg/dL (ref 0–149)
VLDL Cholesterol Cal: 21 mg/dL (ref 5–40)

## 2019-07-28 LAB — COMP. METABOLIC PANEL (12)
AST: 10 IU/L (ref 0–40)
Albumin/Globulin Ratio: 1.5 (ref 1.2–2.2)
Albumin: 4.3 g/dL (ref 3.8–4.9)
Alkaline Phosphatase: 94 IU/L (ref 48–121)
BUN/Creatinine Ratio: 11 — ABNORMAL LOW (ref 12–28)
BUN: 10 mg/dL (ref 8–27)
Bilirubin Total: 0.4 mg/dL (ref 0.0–1.2)
Calcium: 9.3 mg/dL (ref 8.7–10.3)
Chloride: 104 mmol/L (ref 96–106)
Creatinine, Ser: 0.89 mg/dL (ref 0.57–1.00)
GFR calc Af Amer: 81 mL/min/{1.73_m2} (ref >59)
GFR calc non Af Amer: 71 mL/min/{1.73_m2} (ref >59)
Globulin, Total: 2.9 g/dL (ref 1.5–4.5)
Glucose: 106 mg/dL — ABNORMAL HIGH (ref 65–99)
Potassium: 4 mmol/L (ref 3.5–5.2)
Sodium: 142 mmol/L (ref 134–144)
Total Protein: 7.2 g/dL (ref 6.0–8.5)

## 2019-08-08 ENCOUNTER — Other Ambulatory Visit: Payer: Self-pay | Admitting: Nurse Practitioner

## 2019-08-08 ENCOUNTER — Telehealth (INDEPENDENT_AMBULATORY_CARE_PROVIDER_SITE_OTHER): Payer: 59 | Admitting: Nurse Practitioner

## 2019-08-08 ENCOUNTER — Telehealth: Payer: Self-pay | Admitting: Nurse Practitioner

## 2019-08-08 DIAGNOSIS — F329 Major depressive disorder, single episode, unspecified: Secondary | ICD-10-CM | POA: Diagnosis not present

## 2019-08-08 DIAGNOSIS — F32A Depression, unspecified: Secondary | ICD-10-CM

## 2019-08-08 NOTE — Telephone Encounter (Signed)
Can you call her and if she needs me you can forward

## 2019-08-08 NOTE — Telephone Encounter (Signed)
Pt requested a call back at your earliest convenience. States that it is extremely important that she is contacted before EOD today

## 2019-08-08 NOTE — Progress Notes (Signed)
° °  [place], [place]  [address] Phone:  [phone]   Fax:  [phone]   Patient car driving Provider office  She has been speaking with a counsel at work and was recently diagnosed with depression.  Depression Patient complains of depression. She complains of anhedonia, depressed mood, fatigue, hypersomnia and weight loss. Onset was approximately several weeks ago. Symptoms have been gradually worsening since that time. Current symptoms include: difficulty concentrating and easily irritated . Patient denies feelings of worthlessness/guilt, hopelessness, impaired memory, psychomotor agitation, suicidal attempt, suicidal thoughts with specific plan, suicidal thoughts without plan and weight gain. Family history significant for no psychiatric illness and unknown. Possible organic causes contributing are: unknown. Risk factors: negative life event suffered the death on mom in April 07, 2017 and 8 other family member over the last 2 yrs.. Previous treatment includes individual therapy. She has Wellbutrin in her possession for smoking cessation but she has not been using the medication.   She will start the Wellbutrin as directed and follow up in a few weeks.  She is in the need of FMLA completion. Patient to have forms faxed to the office

## 2019-08-09 ENCOUNTER — Other Ambulatory Visit: Payer: Self-pay

## 2019-08-11 NOTE — Progress Notes (Signed)
[place], [place]  [address] Phone:  [phone]   Fax:  [phone]    Virtual telephone visit      Virtual Visit via Telephone Note   This visit type was conducted due to national recommendations for restrictions regarding the COVID-19 Pandemic (e.g. social distancing) in an effort to limit this patient's exposure and mitigate transmission in our community. Due to her co-morbid illnesses, this patient is at least at moderate risk for complications without adequate follow up. This format is felt to be most appropriate for this patient at this time. The patient did not have access to video technology or had technical difficulties with video requiring transitioning to audio format only (telephone). Physical exam was limited to content and character of the telephone converstion.    Patient location: car driving Provider location: office    Patient: Cynthia Patton   DOB: 1960/09/07   59 y.o. Female  MRN: [id number] Visit Date: 08/11/2019  Today's Provider: Vevelyn Francois, NP  Subjective:    Depression.  HPI   She has been speaking with a counsel at work and was recently diagnosed with depression.  Depression Patient complains of depression. She complains of anhedonia, depressed mood, fatigue, hypersomnia and weight loss. Onset was approximately several weeks ago. Symptoms have been gradually worsening since that time. Current symptoms include: difficulty concentrating and easily irritated . Patient denies feelings of worthlessness/guilt, hopelessness, impaired memory, psychomotor agitation, suicidal attempt, suicidal thoughts with specific plan, suicidal thoughts without plan and weight gain. Family history significant for no psychiatric illness and unknown. Possible organic causes contributing are: unknown. Risk factors: negative life event suffered the death on mom in Mar 09, 2017 and 8 other family member over the last 2 yrs.. Previous treatment  includes individual therapy. She has Wellbutrin in her possession for smoking cessation but she has not been using the medication.        Medications: Outpatient Medications Prior to Visit  Medication Sig   albuterol (PROVENTIL HFA;VENTOLIN HFA) 108 (90 Base) MCG/ACT inhaler Inhale 2 puffs into the lungs every 4 (four) hours as needed for wheezing or shortness of breath.   aspirin EC 81 MG tablet Take 1 tablet (81 mg total) by mouth daily.   blood glucose meter kit and supplies KIT Dispense based on patient and insurance preference. Use up to four times daily as directed. (FOR ICD-9 250.00, 250.01).   buPROPion (WELLBUTRIN SR) 150 MG 12 hr tablet Take 1 tablet (150 mg total) by mouth 2 (two) times daily.   diltiazem (DILACOR XR) 180 MG 24 hr capsule Take 1 capsule (180 mg total) by mouth daily.   ibuprofen (ADVIL) 800 MG tablet Take 1 tablet (800 mg total) by mouth every 8 (eight) hours as needed.   levothyroxine (LEVOXYL) 175 MCG tablet Take 1 tablet (175 mcg total) by mouth daily before breakfast.   losartan (COZAAR) 50 MG tablet Take 50 mg by mouth daily.   mometasone (NASONEX) 50 MCG/ACT nasal spray Place 2 sprays into the nose daily. (Patient taking differently: Place 2 sprays into the nose daily as needed (nasal congestion). )   ONETOUCH VERIO test strip USE UP TO 4 TIMES DAILY AS DIRECTED   Semaglutide,0.25 or 0.5MG/DOS, 2 MG/1.5ML SOPN Use 0.25 mg once weekly for 4 weeks, then increase to 0.5 mg once weekly   No facility-administered medications prior to visit.    Review of Systems     Obje ctive:  There were no vitals taken for this visit.     Assessment & Plan:     Assessment  Primary Diagnosis & Pertinent Problem List: The encounter diagnosis was Depression, unspecified depression type.  Visit Diagnosis: 1. Depression, unspecified depression type  Long discssion with patient will start taking the Wellbutrin that she has on hand at home. That was  initially for smoking cessation. Follow up in a few weeks with me and continue to follow up with counselor if treatment is not effective with work along with patient on other treatment options.  She is in the need of FMLA completion. Patient to have forms faxed to the office      I discussed the assessment and treatment plan with the patient. The patient was provided an opportunity to ask questions and all were answered. The patient agreed with the plan and demonstrated an understanding of the instructions.   The patient was advised to call back or seek an in-person evaluation if the symptoms worsen or if the condition fails to improve as anticipated.  I provided 24 minutes of non-face-to-face time during this encounter.   Vevelyn Francois, NP  [place] [phone] (phone) [phone] (fax)  [place]

## 2019-08-23 ENCOUNTER — Ambulatory Visit: Payer: Self-pay | Admitting: Nurse Practitioner

## 2019-08-28 ENCOUNTER — Other Ambulatory Visit: Payer: Self-pay | Admitting: Nurse Practitioner

## 2019-08-28 DIAGNOSIS — I10 Essential (primary) hypertension: Secondary | ICD-10-CM

## 2019-09-06 ENCOUNTER — Telehealth: Payer: Self-pay | Admitting: Nurse Practitioner

## 2019-09-06 NOTE — Telephone Encounter (Signed)
Please look in patient's chart to see if there is any FMLA paperwork.  If not can you please call the patient and have her fax it over because tell her I am seeing the patient thank you

## 2019-09-06 NOTE — Telephone Encounter (Signed)
pls return pt call concerning leave paperwork

## 2019-09-11 NOTE — Telephone Encounter (Signed)
Spoke with patient, she stated that on the FMLA the portion wasn't filled out about how many days per month.   Patient has an appointment on Thursday on 09-13-2019.   FMLA form is scanned in chart

## 2019-09-13 ENCOUNTER — Ambulatory Visit (INDEPENDENT_AMBULATORY_CARE_PROVIDER_SITE_OTHER): Payer: 59 | Admitting: Nurse Practitioner

## 2019-09-13 ENCOUNTER — Encounter: Payer: Self-pay | Admitting: Nurse Practitioner

## 2019-09-13 ENCOUNTER — Other Ambulatory Visit: Payer: Self-pay

## 2019-09-13 VITALS — BP 145/54 | HR 82 | Temp 97.1°F | Ht 64.0 in | Wt 211.0 lb

## 2019-09-13 DIAGNOSIS — L0291 Cutaneous abscess, unspecified: Secondary | ICD-10-CM | POA: Diagnosis not present

## 2019-09-13 DIAGNOSIS — F32A Depression, unspecified: Secondary | ICD-10-CM

## 2019-09-13 DIAGNOSIS — F329 Major depressive disorder, single episode, unspecified: Secondary | ICD-10-CM | POA: Diagnosis not present

## 2019-09-13 MED ORDER — DOXYCYCLINE HYCLATE 100 MG PO CAPS: 100.0000 mg | ORAL_CAPSULE | Freq: Two times a day (BID) | ORAL | 0 refills | Status: AC

## 2019-09-13 MED ORDER — CITALOPRAM HYDROBROMIDE 10 MG PO TABS: 10.0000 mg | ORAL_TABLET | Freq: Every day | ORAL | 11 refills | Status: DC

## 2019-09-13 NOTE — Progress Notes (Signed)
[place], [place]  [address] Phone:  [phone]   Fax:  [phone]   Established Patient Office Visit  Subjective:  Patient ID: Cynthia Patton, female    DOB: [birth year]  Age: 59 y.o. MRN: [id number]  CC:  Chief Complaint  Patient presents with  . Recurrent Skin Infections    boil at rectum area been there for one week, patient soaked body in warm water helped make  boil smaller    HPI Cynthia Patton presents for follow up. She  has a past medical history of Diabetes mellitus without complication ([place]), Hypertension, and Thyroid disease.    She is following up for her depression. She admits that she had another episode last week. She was unable to work. She admits that she that she was unable to work on Sunday and Monday. She is behind on her bills.  She has been having these depression symptoms for sometime however was not sure  what was going on. suShe is alone and does not have a lot of family support. She admits that a old female friend looked her. She feels like this contributed to her worsening symptoms. She was prescribed Wellbutrin for depression and smoking and depression. She has only taken this for a short period because she did not like the way that it made her feel. She is willing to try something else. She states that it made her feel not herself.  She is currenlty speaking with a counselor through her employer. However she feels like the sessions are just one way. She is looking of coping help etc.   Abscess Patient presents for evaluation of a cutaneous abscess. Lesion is located in the  buttock. Onset was 1 week ago. Symptoms have stabilized. Abscess has associated symptoms of pain. Patient does not have previous history of cutaneous abscesses. Patient does have diabetes.   Past Medical History:  Diagnosis Date  . Diabetes mellitus without complication ([place])   . Hypertension   . Thyroid disease     Past Surgical  History:  Procedure Laterality Date  . ABDOMINAL HYSTERECTOMY    . CHOLECYSTECTOMY    . INCISE AND DRAIN ABCESS     on right leg     Family History  Problem Relation Age of Onset  . Kidney disease Mother   . Hypertension Mother   . Hypertension Sister   . Heart disease Brother     Social History   Socioeconomic History  . Marital status: Single    Spouse name: Not on file  . Number of children: Not on file  . Years of education: Not on file  . Highest education level: Not on file  Occupational History  . Not on file  Tobacco Use  . Smoking status: Current Every Day Smoker    Packs/day: 0.50    Types: Cigarettes  . Smokeless tobacco: Never Used  Vaping Use  . Vaping Use: Never used  Substance and Sexual Activity  . Alcohol use: No  . Drug use: No  . Sexual activity: Not Currently  Other Topics Concern  . Not on file  Social History Narrative  . Not on file   Social Determinants of Health   Financial Resource Strain:   . Difficulty of Paying Living Expenses: Not on file  Food Insecurity:   . Worried About Charity fundraiser in the Last Year: Not on file  . Ran Out of Food in the Last  Year: Not on file  Transportation Needs:   . Lack of Transportation (Medical): Not on file  . Lack of Transportation (Non-Medical): Not on file  Physical Activity:   . Days of Exercise per Week: Not on file  . Minutes of Exercise per Session: Not on file  Stress:   . Feeling of Stress : Not on file  Social Connections:   . Frequency of Communication with Friends and Family: Not on file  . Frequency of Social Gatherings with Friends and Family: Not on file  . Attends Religious Services: Not on file  . Active Member of Clubs or Organizations: Not on file  . Attends Archivist Meetings: Not on file  . Marital Status: Not on file  Intimate Partner Violence:   . Fear of Current or Ex-Partner: Not on file  . Emotionally Abused: Not on file  . Physically Abused: Not on  file  . Sexually Abused: Not on file    Outpatient Medications Prior to Visit  Medication Sig Dispense Refill  . albuterol (PROVENTIL HFA;VENTOLIN HFA) 108 (90 Base) MCG/ACT inhaler Inhale 2 puffs into the lungs every 4 (four) hours as needed for wheezing or shortness of breath. 1 Inhaler 1  . aspirin EC 81 MG tablet Take 1 tablet (81 mg total) by mouth daily. 30 tablet 11  . blood glucose meter kit and supplies KIT Dispense based on patient and insurance preference. Use up to four times daily as directed. (FOR ICD-9 250.00, 250.01). 1 each 0  . DILT-XR 180 MG 24 hr capsule Take 1 capsule by mouth once daily 30 capsule 2  . ibuprofen (ADVIL) 800 MG tablet Take 1 tablet (800 mg total) by mouth every 8 (eight) hours as needed. 30 tablet 3  . levothyroxine (LEVOXYL) 175 MCG tablet Take 1 tablet (175 mcg total) by mouth daily before breakfast. 30 tablet 3  . losartan (COZAAR) 50 MG tablet Take 50 mg by mouth daily.    . mometasone (NASONEX) 50 MCG/ACT nasal spray Place 2 sprays into the nose daily. (Patient taking differently: Place 2 sprays into the nose daily as needed (nasal congestion). ) 17 g 1  . ONETOUCH VERIO test strip USE UP TO 4 TIMES DAILY AS DIRECTED    . Semaglutide,0.25 or 0.5MG/DOS, 2 MG/1.5ML SOPN Use 0.25 mg once weekly for 4 weeks, then increase to 0.5 mg once weekly    . buPROPion (WELLBUTRIN SR) 150 MG 12 hr tablet Take 1 tablet (150 mg total) by mouth 2 (two) times daily. 180 tablet 0   No facility-administered medications prior to visit.    Allergies  Allergen Reactions  . Floxin [Ocuflox] Other (See Comments)    Disoriented  . Ofloxacin Other (See Comments)    Disoriented  . Percocet [Oxycodone-Acetaminophen]     ROS Review of Systems    Objective:    Physical Exam Constitutional:      Appearance: She is obese.  HENT:     Head: Normocephalic and atraumatic.     Nose: Nose normal.     Mouth/Throat:     Mouth: Mucous membranes are moist.  Cardiovascular:       Rate and Rhythm: Normal rate and regular rhythm.     Pulses: Normal pulses.     Heart sounds: Normal heart sounds.  Pulmonary:     Effort: Pulmonary effort is normal.     Breath sounds: Normal breath sounds.  Abdominal:     Palpations: Abdomen is soft.  Genitourinary:  Comments: Right buttock abscess  Musculoskeletal:        General: Normal range of motion.     Cervical back: Normal range of motion.  Skin:    General: Skin is warm and dry.     Capillary Refill: Capillary refill takes less than 2 seconds.  Neurological:     General: No focal deficit present.     Mental Status: She is alert and oriented to person, place, and time.  Psychiatric:        Mood and Affect: Mood normal.        Behavior: Behavior normal.        Thought Content: Thought content normal.        Judgment: Judgment normal.     BP (!) 145/54   Pulse 82   Temp (!) 97.1 F (36.2 C) (Temporal)   Ht _0  (1.626 m)   Wt 211 lb (95.7 kg)   SpO2 100%   BMI 36.22 kg/m  Wt Readings from Last 3 Encounters:  09/13/19 211 lb (95.7 kg)  07/27/19 214 lb (97.1 kg)  06/04/19 217 lb 9.6 oz (98.7 kg)     Health Maintenance Due  Topic Date Due  . OPHTHALMOLOGY EXAM  Never done  . PAP SMEAR-Modifier  Never done  . COLONOSCOPY  Never done  . FOOT EXAM  07/12/2018    There are no preventive care reminders to display for this patient.  Lab Results  Component Value Date   TSH 14.280 (H) 07/13/2018   Lab Results  Component Value Date   WBC 9.7 07/27/2019   HGB 12.9 07/27/2019   HCT 38.8 07/27/2019   MCV 92 07/27/2019   PLT 356 07/27/2019   Lab Results  Component Value Date   NA 142 07/27/2019   K 4.0 07/27/2019   CO2 26 06/03/2019   GLUCOSE 106 (H) 07/27/2019   BUN 10 07/27/2019   CREATININE 0.89 07/27/2019   BILITOT 0.4 07/27/2019   ALKPHOS 94 07/27/2019   AST 10 07/27/2019   ALT 13 02/12/2019   PROT 7.2 07/27/2019   ALBUMIN 4.3 07/27/2019   CALCIUM 9.3 07/27/2019   ANIONGAP 12  06/03/2019   Lab Results  Component Value Date   CHOL 171 07/27/2019   Lab Results  Component Value Date   HDL 37 (L) 07/27/2019   Lab Results  Component Value Date   LDLCALC 113 (H) 07/27/2019   Lab Results  Component Value Date   TRIG 113 07/27/2019   Lab Results  Component Value Date   CHOLHDL 4.6 (H) 07/27/2019   Lab Results  Component Value Date   HGBA1C 8.6 (A) 10/17/2018      Assessment & Plan:   Problem List Items Addressed This Visit    None    Visit Diagnoses    Depression, unspecified depression type    -  Primary   Relevant Medications   citalopram (CELEXA) 10 MG tablet   Other Relevant Orders   Ambulatory referral to Psychiatry For more counseling with treatment plan and goals.   Abscess     Empirical treatment due to underlying DM Patient education provided      Meds ordered this encounter  Medications  . doxycycline (VIBRAMYCIN) 100 MG capsule    Sig: Take 1 capsule (100 mg total) by mouth 2 (two) times daily for 5 days.    Dispense:  10 capsule    Refill:  0    Order Specific Question:   Supervising Provider  AnswerTresa Garter [id number]  . citalopram (CELEXA) 10 MG tablet    Sig: Take 1 tablet (10 mg total) by mouth daily.    Dispense:  30 tablet    Refill:  11    Order Specific Question:   Supervising Provider    Answer:   Tresa Garter [[id number]]    Follow-up: Return in about 6 weeks (around 10/25/2019) for virtual appt.    Vevelyn Francois, NP

## 2019-09-16 ENCOUNTER — Encounter: Payer: Self-pay | Admitting: Nurse Practitioner

## 2019-09-21 ENCOUNTER — Ambulatory Visit (AMBULATORY_SURGERY_CENTER): Payer: Self-pay | Admitting: *Deleted

## 2019-09-21 ENCOUNTER — Encounter: Payer: Self-pay | Admitting: Gastroenterology

## 2019-09-21 ENCOUNTER — Other Ambulatory Visit: Payer: Self-pay

## 2019-09-21 VITALS — Ht 64.0 in | Wt 208.0 lb

## 2019-09-21 DIAGNOSIS — Z1211 Encounter for screening for malignant neoplasm of colon: Secondary | ICD-10-CM

## 2019-09-21 MED ORDER — PEG 3350-KCL-NA BICARB-NACL 420 G PO SOLR: 4000.0000 mL | Freq: Once | ORAL | 0 refills | Status: AC

## 2019-09-21 NOTE — Progress Notes (Signed)
Patient is here in-person for PV. Patient denies any allergies to eggs or soy. Patient denies any problems with anesthesia/sedation. Patient denies any oxygen use at home. Patient denies taking any diet/weight loss medications or blood thinners. Patient is not being treated for MRSA or C-diff. Patient is aware of our care-partner policy and KGYBN-12 safety protocol. EMMI education assisgned to the patient for the procedure, sent to MyChart.   COVID-19 vaccines completed on 04/16/19, per patient.

## 2019-10-01 ENCOUNTER — Telehealth: Payer: Self-pay | Admitting: Nurse Practitioner

## 2019-10-02 NOTE — Telephone Encounter (Signed)
Called patient on 10-01-2019, left voicemail for call back to office

## 2019-10-05 ENCOUNTER — Ambulatory Visit (AMBULATORY_SURGERY_CENTER): Payer: 59 | Admitting: Gastroenterology

## 2019-10-05 ENCOUNTER — Other Ambulatory Visit: Payer: Self-pay

## 2019-10-05 ENCOUNTER — Encounter: Payer: Self-pay | Admitting: Gastroenterology

## 2019-10-05 VITALS — BP 165/66 | HR 76 | Temp 96.4°F | Resp 18 | Ht 64.0 in | Wt 208.0 lb

## 2019-10-05 DIAGNOSIS — D124 Benign neoplasm of descending colon: Secondary | ICD-10-CM

## 2019-10-05 DIAGNOSIS — K621 Rectal polyp: Secondary | ICD-10-CM

## 2019-10-05 DIAGNOSIS — D128 Benign neoplasm of rectum: Secondary | ICD-10-CM

## 2019-10-05 DIAGNOSIS — D122 Benign neoplasm of ascending colon: Secondary | ICD-10-CM | POA: Diagnosis not present

## 2019-10-05 DIAGNOSIS — D123 Benign neoplasm of transverse colon: Secondary | ICD-10-CM

## 2019-10-05 DIAGNOSIS — K635 Polyp of colon: Secondary | ICD-10-CM

## 2019-10-05 DIAGNOSIS — D125 Benign neoplasm of sigmoid colon: Secondary | ICD-10-CM

## 2019-10-05 DIAGNOSIS — Z1211 Encounter for screening for malignant neoplasm of colon: Secondary | ICD-10-CM

## 2019-10-05 MED ORDER — SODIUM CHLORIDE 0.9 % IV SOLN: 500.0000 mL | Freq: Once | INTRAVENOUS | Status: DC

## 2019-10-05 NOTE — Patient Instructions (Addendum)
Handout was given to you on polyps. NO ASPIRIN, ASPIRIN CONTAINING PRODUCTS (BC OR GOODY POWDERS) OR NSAIDS (IBUPROFEN, ADVIL, ALEVE, AND MOTRIN) FOR 2 WEEKS; TYLENOL IS OK TO TAKE if needed. Your blood sugar was 88 in the recovery room. You may resume your other current medications today. Await biopsy results. Please call if any questions or concerns.     YOU HAD AN ENDOSCOPIC PROCEDURE TODAY AT [facility]:   Refer to the procedure report that was given to you for any specific questions about what was found during the examination.  If the procedure report does not answer your questions, please call your gastroenterologist to clarify.  If you requested that your care partner not be given the details of your procedure findings, then the procedure report has been included in a sealed envelope for you to review at your convenience later.  YOU SHOULD EXPECT: Some feelings of bloating in the abdomen. Passage of more gas than usual.  Walking can help get rid of the air that was put into your GI tract during the procedure and reduce the bloating. If you had a lower endoscopy (such as a colonoscopy or flexible sigmoidoscopy) you may notice spotting of blood in your stool or on the toilet paper. If you underwent a bowel prep for your procedure, you may not have a normal bowel movement for a few days.  Please Note:  You might notice some irritation and congestion in your nose or some drainage.  This is from the oxygen used during your procedure.  There is no need for concern and it should clear up in a day or so.  SYMPTOMS TO REPORT IMMEDIATELY:   Following lower endoscopy (colonoscopy or flexible sigmoidoscopy):  Excessive amounts of blood in the stool  Significant tenderness or worsening of abdominal pains  Swelling of the abdomen that is new, acute  Fever of 100F or higher   For urgent or emergent issues, a gastroenterologist can be reached at any hour by calling [phone]  [phone]. Do not use MyChart messaging for urgent concerns.    DIET:  We do recommend a small meal at first, but then you may proceed to your regular diet.  Drink plenty of fluids but you should avoid alcoholic beverages for 24 hours.  ACTIVITY:  You should plan to take it easy for the rest of today and you should NOT DRIVE or use heavy machinery until tomorrow (because of the sedation medicines used during the test).    FOLLOW UP: Our staff will call the number listed on your records 48-72 hours following your procedure to check on you and address any questions or concerns that you may have regarding the information given to you following your procedure. If we do not reach you, we will leave a message.  We will attempt to reach you two times.  During this call, we will ask if you have developed any symptoms of COVID 19. If you develop any symptoms (ie: fever, flu-like symptoms, shortness of breath, cough etc.) before then, please call [phone].  If you test positive for Covid 19 in the 2 weeks post procedure, please call and report this information to [place].    If any biopsies were taken you will be contacted by phone or by letter within the next 1-3 weeks.  Please call us at [phone] if you have not heard about the biopsies in 3 weeks.    SIGNATURES/CONFIDENTIALITY: You and/or your care partner have signed paperwork which will  be entered into your electronic medical record.  These signatures attest to the fact that that the information above on your After Visit Summary has been reviewed and is understood.  Full responsibility of the confidentiality of this discharge information lies with you and/or your care-partner.

## 2019-10-05 NOTE — Progress Notes (Signed)
Vital signs checked by:SP  The patient states no changes in medical or surgical history since pre-visit screening on 09/21/19.

## 2019-10-05 NOTE — Op Note (Signed)
[facility] Patient Name: Cynthia Patton Procedure Date: 10/05/2019 12:26 PM MRN: [id number] Endoscopist: Ladene Artist , MD Age: 59 Referring MD:  Date of Birth: 07/08/1960 Gender: Female Account #: [id number] Procedure:                Colonoscopy Indications:              Screening for colorectal malignant neoplasm Medicines:                Monitored Anesthesia Care Procedure:                Pre-Anesthesia Assessment:                           - Prior to the procedure, a History and Physical                            was performed, and patient medications and                            allergies were reviewed. The patient's tolerance of                            previous anesthesia was also reviewed. The risks                            and benefits of the procedure and the sedation                            options and risks were discussed with the patient.                            All questions were answered, and informed consent                            was obtained. Prior Anticoagulants: The patient has                            taken no previous anticoagulant or antiplatelet                            agents. ASA Grade Assessment: II - A patient with                            mild systemic disease. After reviewing the risks                            and benefits, the patient was deemed in                            satisfactory condition to undergo the procedure.                           After obtaining informed consent, the colonoscope  was passed under direct vision. Throughout the                            procedure, the patient's blood pressure, pulse, and                            oxygen saturations were monitored continuously. The                            Colonoscope was introduced through the anus and                            advanced to the the cecum, identified by                            appendiceal orifice and  ileocecal valve. The                            ileocecal valve, appendiceal orifice, and rectum                            were photographed. The quality of the bowel                            preparation was good. The colonoscopy was performed                            without difficulty. The patient tolerated the                            procedure well. Scope In: 12:37:14 PM Scope Out: 1:04:59 PM Scope Withdrawal Time: 0 hours 25 minutes 48 seconds  Total Procedure Duration: 0 hours 27 minutes 45 seconds  Findings:                 The digital rectal exam was abnormal with a focal                            indurated, hyperpigmented area in the perianal area                            on the left buttock.                           A 10 mm polyp was found in the sigmoid colon. The                            polyp was sessile. The polyp was removed with a hot                            snare. Resection and retrieval were complete.                           Twenty-three sessile polyps were found in the  rectum (5), sigmoid colon (10), descending colon                            (6), transverse colon (1) and ascending colon (1).                            The polyps were 5 to 8 mm in size. These polyps                            were removed with a cold snare. Resection and                            retrieval were complete.                           The exam was otherwise without abnormality on                            direct and retroflexion views. Complications:            No immediate complications. Estimated blood loss:                            None. Estimated Blood Loss:     Estimated blood loss: none. Impression:               - Abnormal digital rectal exam.                           - One 10 mm polyp in the sigmoid colon, removed                            with a hot snare. Resected and retrieved.                           - Twenty-three 5 to 8 mm  polyps in the rectum, in                            the sigmoid colon, in the descending colon, in the                            transverse colon and in the ascending colon,                            removed with a cold snare. Resected and retrieved.                           - The examination was otherwise normal on direct                            and retroflexion views. Recommendation:           - Repeat colonoscopy after studies are complete for  surveillance based on pathology results.                           - Patient has a contact number available for                            emergencies. The signs and symptoms of potential                            delayed complications were discussed with the                            patient. Return to normal activities tomorrow.                            Written discharge instructions were provided to the                            patient.                           - Resume previous diet.                           - Continue present medications.                           - Await pathology results.                           - No aspirin, ibuprofen, naproxen, or other                            non-steroidal anti-inflammatory drugs for 2 weeks                            after polyp removal. Ladene Artist, MD 10/05/2019 1:12:27 PM This report has been signed electronically.

## 2019-10-05 NOTE — Progress Notes (Signed)
Pt was restless on the stretcher when she arrived in the recovery room.  I asked if she was in pain and pt said she left shoulder was uncomfortable from laying on her side.  I asked pt if she had passed flatus and she said she did not know.  I asked her to roll to her right side and to please pass flatus. Pt seemed flustered.  I explained why it was so important to pass the flatus.  As pt woke up she did pass gas and seemed to understand the importance.   Per Dr. Fuller Plan pt to hold asa 81 mg for 2 weeks, then she may restart.  Pt was advised of this. Maw

## 2019-10-05 NOTE — Progress Notes (Signed)
To PACU, VSS. Report to Rn.tb 

## 2019-10-05 NOTE — Progress Notes (Signed)
Called to room to assist during endoscopic procedure.  Patient ID and intended procedure confirmed with present staff. Received instructions for my participation in the procedure from the performing physician.  

## 2019-10-09 ENCOUNTER — Telehealth: Payer: Self-pay | Admitting: *Deleted

## 2019-10-09 DIAGNOSIS — Z1211 Encounter for screening for malignant neoplasm of colon: Secondary | ICD-10-CM

## 2019-10-09 MED ORDER — DICYCLOMINE HCL 10 MG PO CAPS: 10.0000 mg | ORAL_CAPSULE | Freq: Three times a day (TID) | ORAL | 0 refills | Status: DC

## 2019-10-09 NOTE — Telephone Encounter (Signed)
Called Cynthia Patton to relay instructions/orders from Dr. Fuller Plan regarding her complaints of abdominal cramping from this mornings f/u phone call. Instructed Cynthia Patton to take dicyclomine TID AC x5 days, then TID PRN for abdominal cramping/spasms; prescription sent electronically to Cynthia Patton's pharmacy after verifying pharmacy location with the Cynthia Patton. No strenuous activity for 1 week and a low-residue diet for 1 week. Instructed Cynthia Patton to call Dr. Lynne Leader office if pain/symptoms have not resolved in 5-7 days. Cynthia Patton verbalized understanding of instructions and plan of care.

## 2019-10-09 NOTE — Telephone Encounter (Signed)
  Follow up Call-  Call back number 10/05/2019  Post procedure Call Back phone  # [phone]  Permission to leave phone message Yes  Some recent data might be hidden     Patient questions:  Do you have a fever, pain , or abdominal swelling? Yes.   Pain Score  5 * - Pt states she is having intermittent pain on her right side. "it feels like something is still in me, like metal or something. It starts off nagging and then progresses to cramping pain 5/10. I just have to stop what I am doing and wait a bit and after about 10 minutes the pain goes away. I also feel like my insides are quivering at times." RN reassured pt that no metal was used during her procedure that would have been left behind. Pt did have a large amount of polyps removed and could be having residual discomfort from the procedure. RN to make Dr. Fuller Plan aware and pt instructed that our office would call her back if he has any recommendations. RN instructed pt to call back if pain changes or worsens. Pt agreeable to plan of care.   Have you tolerated food without any problems? Yes.    Have you been able to return to your normal activities? Yes.    Do you have any questions about your discharge instructions: Diet   No. Medications  No. Follow up visit  No.  Do you have questions or concerns about your Care? Yes.    Actions: * If pain score is 4 or above: Physician/ provider Notified : Lucio Edward, MD.  1. Have you developed a fever since your procedure? no  2.   Have you had an respiratory symptoms (SOB or cough) since your procedure? no  3.   Have you tested positive for COVID 19 since your procedure no  4.   Have you had any family members/close contacts diagnosed with the COVID 19 since your procedure?  no   If yes to any of these questions please route to Joylene John, RN and Joella Prince, RN

## 2019-10-09 NOTE — Telephone Encounter (Signed)
Please call her and advise to take dicyclomine for 5 days and then tid prn. Low residue diet for 1 week. No strenuous activities for 1 week. Dicyclomine 10 mg po tid ac, #30. Call if symptoms not resolving in 5-7 days.

## 2019-10-10 NOTE — Telephone Encounter (Signed)
Please see patient's request for phone call.

## 2019-10-15 ENCOUNTER — Encounter: Payer: Self-pay | Admitting: Gastroenterology

## 2019-10-15 ENCOUNTER — Telehealth: Payer: Self-pay | Admitting: Gastroenterology

## 2019-10-15 NOTE — Telephone Encounter (Signed)
Patient notified that there are no forms here.  She will notify her employer and obtain the forms.  She is notified of the $29 fee and to come to the office to fill out paperwork.  I asked if a note can be provided to her employer rather than her going through the trouble of FMLA and she said her employer will not accept it.

## 2019-10-15 NOTE — Telephone Encounter (Signed)
Patient called states she had her work send over forms for her to be excused from work "leave of absent" for about 7 hours due to her not being able to function from the medication she was given after her procedure. Patient is calling to follow up on receipt of the forms

## 2019-10-25 ENCOUNTER — Telehealth: Payer: Self-pay | Admitting: Nurse Practitioner

## 2019-10-26 ENCOUNTER — Ambulatory Visit: Payer: Self-pay | Admitting: Nurse Practitioner

## 2019-10-26 ENCOUNTER — Encounter: Payer: Self-pay | Admitting: Nurse Practitioner

## 2019-11-01 ENCOUNTER — Ambulatory Visit (INDEPENDENT_AMBULATORY_CARE_PROVIDER_SITE_OTHER): Payer: 59 | Admitting: Nurse Practitioner

## 2019-11-01 ENCOUNTER — Encounter: Payer: Self-pay | Admitting: Nurse Practitioner

## 2019-11-01 ENCOUNTER — Other Ambulatory Visit: Payer: Self-pay

## 2019-11-01 VITALS — BP 149/52 | HR 82 | Temp 98.0°F | Ht 64.0 in | Wt 206.0 lb

## 2019-11-01 DIAGNOSIS — H35031 Hypertensive retinopathy, right eye: Secondary | ICD-10-CM

## 2019-11-01 DIAGNOSIS — I10 Essential (primary) hypertension: Secondary | ICD-10-CM

## 2019-11-01 DIAGNOSIS — E782 Mixed hyperlipidemia: Secondary | ICD-10-CM

## 2019-11-01 DIAGNOSIS — F32A Depression, unspecified: Secondary | ICD-10-CM

## 2019-11-01 DIAGNOSIS — E119 Type 2 diabetes mellitus without complications: Secondary | ICD-10-CM | POA: Diagnosis not present

## 2019-11-01 DIAGNOSIS — E039 Hypothyroidism, unspecified: Secondary | ICD-10-CM

## 2019-11-01 LAB — POCT GLYCOSYLATED HEMOGLOBIN (HGB A1C): Hemoglobin A1C: 6.3 % — AB (ref 4.0–5.6)

## 2019-11-01 LAB — POCT URINALYSIS DIP (CLINITEK)
Bilirubin, UA: NEGATIVE
Glucose, UA: NEGATIVE mg/dL
Ketones, POC UA: NEGATIVE mg/dL
Leukocytes, UA: NEGATIVE
Nitrite, UA: NEGATIVE
POC PROTEIN,UA: NEGATIVE
Spec Grav, UA: 1.01 (ref 1.010–1.025)
Urobilinogen, UA: 0.2 E.U./dL
pH, UA: 5.5 (ref 5.0–8.0)

## 2019-11-01 MED ORDER — DILTIAZEM HCL ER 180 MG PO CP24: 180.0000 mg | ORAL_CAPSULE | Freq: Every day | ORAL | 3 refills | Status: DC

## 2019-11-01 NOTE — Patient Instructions (Signed)
Stress, Adult Stress is a normal reaction to life events. Stress is what you feel when life demands more than you are used to, or more than you think you can handle. Some stress can be useful, such as studying for a test or meeting a deadline at work. Stress that occurs too often or for too long can cause problems. It can affect your emotional health and interfere with relationships and normal daily activities. Too much stress can weaken your body's defense system (immune system) and increase your risk for physical illness. If you already have a medical problem, stress can make it worse. What are the causes? All sorts of life events can cause stress. An event that causes stress for one person may not be stressful for another person. Major life events, whether positive or negative, commonly cause stress. Examples include:  Losing a job or starting a new job.  Losing a loved one.  Moving to a new town or home.  Getting married or divorced.  Having a baby.  Getting injured or sick. Less obvious life events can also cause stress, especially if they occur day after day or in combination with each other. Examples include:  Working long hours.  Driving in traffic.  Caring for children.  Being in debt.  Being in a difficult relationship. What are the signs or symptoms? Stress can cause emotional symptoms, including:  Anxiety. This is feeling worried, afraid, on edge, overwhelmed, or out of control.  Anger, including irritation or impatience.  Depression. This is feeling sad, down, helpless, or guilty.  Trouble focusing, remembering, or making decisions. Stress can cause physical symptoms, including:  Aches and pains. These may affect your head, neck, back, stomach, or other areas of your body.  Tight muscles or a clenched jaw.  Low energy.  Trouble sleeping. Stress can cause unhealthy behaviors, including:  Eating to feel better (overeating) or skipping meals.  Working too  much or putting off tasks.  Smoking, drinking alcohol, or using drugs to feel better. How is this diagnosed? Stress is diagnosed through an assessment by your health care provider. He or she may diagnose this condition based on:  Your symptoms and any stressful life events.  Your medical history.  Tests to rule out other causes of your symptoms. Depending on your condition, your health care provider may refer you to a specialist for further evaluation. How is this treated?  Stress management techniques are the recommended treatment for stress. Medicine is not typically recommended for the treatment of stress. Techniques to reduce your reaction to stressful life events include:  Stress identification. Monitor yourself for symptoms of stress and identify what causes stress for you. These skills may help you to avoid or prepare for stressful events.  Time management. Set your priorities, keep a calendar of events, and learn to say no. Taking these actions can help you avoid making too many commitments. Techniques for coping with stress include:  Rethinking the problem. Try to think realistically about stressful events rather than ignoring them or overreacting. Try to find the positives in a stressful situation rather than focusing on the negatives.  Exercise. Physical exercise can release both physical and emotional tension. The key is to find a form of exercise that you enjoy and do it regularly.  Relaxation techniques. These relax the body and mind. The key is to find one or more that you enjoy and use the techniques regularly. Examples include: ? Meditation, deep breathing, or progressive relaxation techniques. ? Yoga or   tai chi. ? Biofeedback, mindfulness techniques, or journaling. ? Listening to music, being out in nature, or participating in other hobbies.  Practicing a healthy lifestyle. Eat a balanced diet, drink plenty of water, limit or avoid caffeine, and get plenty of  sleep.  Having a strong support network. Spend time with family, friends, or other people you enjoy being around. Express your feelings and talk things over with someone you trust. Counseling or talk therapy with a mental health professional may be helpful if you are having trouble managing stress on your own. Follow these instructions at home: Lifestyle   Avoid drugs.  Do not use any products that contain nicotine or tobacco, such as cigarettes, e-cigarettes, and chewing tobacco. If you need help quitting, ask your health care provider.  Limit alcohol intake to no more than 1 drink a day for nonpregnant women and 2 drinks a day for men. One drink equals 12 oz of beer, 5 oz of wine, or 1 oz of hard liquor  Do not use alcohol or drugs to relax.  Eat a balanced diet that includes fresh fruits and vegetables, whole grains, lean meats, fish, eggs, and beans, and low-fat dairy. Avoid processed foods and foods high in added fat, sugar, and salt.  Exercise at least 30 minutes on 5 or more days each week.  Get 7-8 hours of sleep each night. General instructions   Practice stress management techniques as discussed with your health care provider.  Drink enough fluid to keep your urine clear or pale yellow.  Take over-the-counter and prescription medicines only as told by your health care provider.  Keep all follow-up visits as told by your health care provider. This is important. Contact a health care provider if:  Your symptoms get worse.  You have new symptoms.  You feel overwhelmed by your problems and can no longer manage them on your own. Get help right away if:  You have thoughts of hurting yourself or others. If you ever feel like you may hurt yourself or others, or have thoughts about taking your own life, get help right away. You can go to your nearest emergency department or call:  Your local emergency services (911 in the [place]  A suicide crisis helpline, such as the  [place] at (316) 250-6172. This is open 24 hours a day. Summary  Stress is a normal reaction to life events. It can cause problems if it happens too often or for too long.  Practicing stress management techniques is the best way to treat stress.  Counseling or talk therapy with a mental health professional may be helpful if you are having trouble managing stress on your own. This information is not intended to replace advice given to you by your health care provider. Make sure you discuss any questions you have with your health care provider. Document Revised: 07/28/2018 Document Reviewed: 02/18/2016 Elsevier Patient Education  Cynthia Patton.

## 2019-11-01 NOTE — Progress Notes (Signed)
[address], [place]  [address] Phone:  [phone]   Fax:  [phone]   Established Patient Office Visit  Subjective:  Patient ID: Cynthia Patton, female    DOB: 03/01/1960  Age: 59 y.o. MRN: [id number]  CC:  Chief Complaint  Patient presents with  . Follow-up    HPI Cynthia Patton presents for follow up. She  has a past medical history of Anxiety, Cancer ([place]) (2000?), Depression, Diabetes mellitus without complication ([place]), Hyperlipidemia, Hypertension, and Thyroid disease.   Depression Patient complains of depression. She complains of depressed mood. Onset was approximately several months ago. Symptoms have been stable since that time. Current symptoms include: anhedonia and weight loss. Patient denies psychomotor agitation, psychomotor retardation, recurrent thoughts of death, suicidal attempt, suicidal thoughts with specific plan and suicidal thoughts without plan.  Possible organic causes contributing are: stress. Risk factors: negative life event family losses and increased stress with recent colonscopy ; 24 polyps. . Previous treatment includes individual therapy and medication.She admits that she does not want to be dependant on medication. She is still working with the [facility]. She admits that on Monday she was having some abdominal spasms.   She is followed by endocrinology, for DM and thyroid disorder She was in today for PAP test but has asked to have this postponed.  Past Medical History:  Diagnosis Date  . Anxiety   . Cancer ([place]) 2000?   thyroid cancer  . Depression   . Diabetes mellitus without complication ([place])   . Hyperlipidemia   . Hypertension   . Thyroid disease     Past Surgical History:  Procedure Laterality Date  . ABDOMINAL HYSTERECTOMY    . CHOLECYSTECTOMY    . INCISE AND DRAIN ABCESS     on right leg   . THYROIDECTOMY      Family History  Problem Relation Age of Onset  . Kidney disease Mother     . Hypertension Mother   . Hypertension Sister   . Heart disease Brother   . Colon cancer Neg Hx   . Colon polyps Neg Hx   . Esophageal cancer Neg Hx   . Rectal cancer Neg Hx   . Stomach cancer Neg Hx     Social History   Socioeconomic History  . Marital status: Single    Spouse name: Not on file  . Number of children: Not on file  . Years of education: Not on file  . Highest education level: Not on file  Occupational History  . Not on file  Tobacco Use  . Smoking status: Current Every Day Smoker    Packs/day: 0.50    Types: Cigarettes  . Smokeless tobacco: Never Used  Vaping Use  . Vaping Use: Never used  Substance and Sexual Activity  . Alcohol use: No  . Drug use: No  . Sexual activity: Not Currently  Other Topics Concern  . Not on file  Social History Narrative  . Not on file   Social Determinants of Health   Financial Resource Strain:   . Difficulty of Paying Living Expenses: Not on file  Food Insecurity:   . Worried About Charity fundraiser in the Last Year: Not on file  . Ran Out of Food in the Last Year: Not on file  Transportation Needs:   . Lack of Transportation (Medical): Not on file  . Lack of Transportation (Non-Medical): Not on file  Physical Activity:   .  Days of Exercise per Week: Not on file  . Minutes of Exercise per Session: Not on file  Stress:   . Feeling of Stress : Not on file  Social Connections:   . Frequency of Communication with Friends and Family: Not on file  . Frequency of Social Gatherings with Friends and Family: Not on file  . Attends Religious Services: Not on file  . Active Member of Clubs or Organizations: Not on file  . Attends Archivist Meetings: Not on file  . Marital Status: Not on file  Intimate Partner Violence:   . Fear of Current or Ex-Partner: Not on file  . Emotionally Abused: Not on file  . Physically Abused: Not on file  . Sexually Abused: Not on file    Outpatient Medications Prior to Visit   Medication Sig Dispense Refill  . albuterol (PROVENTIL HFA;VENTOLIN HFA) 108 (90 Base) MCG/ACT inhaler Inhale 2 puffs into the lungs every 4 (four) hours as needed for wheezing or shortness of breath. 1 Inhaler 1  . aspirin EC 81 MG tablet Take 1 tablet (81 mg total) by mouth daily. 30 tablet 11  . blood glucose meter kit and supplies KIT Dispense based on patient and insurance preference. Use up to four times daily as directed. (FOR ICD-9 250.00, 250.01). 1 each 0  . citalopram (CELEXA) 10 MG tablet Take 1 tablet (10 mg total) by mouth daily. 30 tablet 11  . dicyclomine (BENTYL) 10 MG capsule Take 1 capsule (10 mg total) by mouth 3 (three) times daily before meals. Dicyclomine TID AC x5 days, then TID PRN abdominal cramping/spasms 30 capsule 0  . ibuprofen (ADVIL) 800 MG tablet Take 1 tablet (800 mg total) by mouth every 8 (eight) hours as needed. 30 tablet 3  . levothyroxine (LEVOXYL) 175 MCG tablet Take 1 tablet (175 mcg total) by mouth daily before breakfast. 30 tablet 3  . losartan (COZAAR) 50 MG tablet Take 50 mg by mouth daily.    . mometasone (NASONEX) 50 MCG/ACT nasal spray Place 2 sprays into the nose daily. (Patient taking differently: Place 2 sprays into the nose daily as needed (nasal congestion). ) 17 g 1  . ONETOUCH VERIO test strip USE UP TO 4 TIMES DAILY AS DIRECTED    . Semaglutide,0.25 or 0.5MG/DOS, 2 MG/1.5ML SOPN Use 0.25 mg once weekly for 4 weeks, then increase to 0.5 mg once weekly    . DILT-XR 180 MG 24 hr capsule Take 1 capsule by mouth once daily 30 capsule 2   No facility-administered medications prior to visit.    Allergies  Allergen Reactions  . Floxin [Ocuflox] Other (See Comments)    Disoriented  . Ofloxacin Other (See Comments)    Disoriented  . Percocet [Oxycodone-Acetaminophen]     ROS Review of Systems  All other systems reviewed and are negative.     Objective:    Physical Exam Constitutional:      General: She is not in acute distress.     Appearance: She is not ill-appearing, toxic-appearing or diaphoretic.  HENT:     Head: Normocephalic and atraumatic.  Musculoskeletal:     Cervical back: Normal range of motion.  Neurological:     Mental Status: She is alert.     BP (!) 149/52 (BP Location: Right Arm, Patient Position: Sitting, Cuff Size: Normal)   Pulse 82   Temp 98 F (36.7 C) (Temporal)   Ht '5\' 4"'  (1.626 m)   Wt 206 lb (93.4 kg)  SpO2 100%   BMI 35.36 kg/m  Wt Readings from Last 3 Encounters:  11/01/19 206 lb (93.4 kg)  10/05/19 208 lb (94.3 kg)  09/21/19 208 lb (94.3 kg)     There are no preventive care reminders to display for this patient.  There are no preventive care reminders to display for this patient.  Lab Results  Component Value Date   TSH 14.280 (H) 07/13/2018   Lab Results  Component Value Date   WBC 9.7 07/27/2019   HGB 12.9 07/27/2019   HCT 38.8 07/27/2019   MCV 92 07/27/2019   PLT 356 07/27/2019   Lab Results  Component Value Date   NA 142 07/27/2019   K 4.0 07/27/2019   CO2 26 06/03/2019   GLUCOSE 106 (H) 07/27/2019   BUN 10 07/27/2019   CREATININE 0.89 07/27/2019   BILITOT 0.4 07/27/2019   ALKPHOS 94 07/27/2019   AST 10 07/27/2019   ALT 13 02/12/2019   PROT 7.2 07/27/2019   ALBUMIN 4.3 07/27/2019   CALCIUM 9.3 07/27/2019   ANIONGAP 12 06/03/2019   Lab Results  Component Value Date   CHOL 171 07/27/2019   Lab Results  Component Value Date   HDL 37 (L) 07/27/2019   Lab Results  Component Value Date   LDLCALC 113 (H) 07/27/2019   Lab Results  Component Value Date   TRIG 113 07/27/2019   Lab Results  Component Value Date   CHOLHDL 4.6 (H) 07/27/2019   Lab Results  Component Value Date   HGBA1C 6.3 (A) 11/01/2019      Assessment & Plan:   Problem List Items Addressed This Visit      Cardiovascular and Mediastinum   Essential hypertension - Primary Encouraged on going compliance with current medication regimen Encouraged home monitoring and  recording BP <130/80 Eating a heart-healthy diet with less salt Encouraged regular physical activity  Recommend Weight loss PAP test to be rescheduled    Relevant Medications   diltiazem (DILT-XR) 180 MG 24 hr capsule     Other   Hyperlipidemia Declines statin therapy   Relevant Medications   diltiazem (DILT-XR) 180 MG 24 hr capsule    Other Visit Diagnoses    Depression, unspecified depression type   Continue with Citalopram  . Continue with therapy employee based counseling  Genomind results not in Previous psy referral still pending   Type 2 diabetes mellitus with hemoglobin A1c goal of less than 7.0% (HCC)       Relevant Orders   POCT HgB A1C (Completed)   POCT URINALYSIS DIP (CLINITEK) (Completed)   Acquired hypothyroidism     Continue to follow up with Endo   Hypertensive retinopathy of right eye    Continue to follow up with retina specialist as scheduled        Meds ordered this encounter  Medications  . diltiazem (DILT-XR) 180 MG 24 hr capsule    Sig: Take 1 capsule (180 mg total) by mouth daily.    Dispense:  90 capsule    Refill:  3    Order Specific Question:   Supervising Provider    Answer:   Tresa Garter [[id number]]    Follow-up: Return in about 3 months (around 02/01/2020).    Vevelyn Francois, NP

## 2019-11-06 ENCOUNTER — Encounter: Payer: Self-pay | Admitting: Nurse Practitioner

## 2020-02-01 ENCOUNTER — Ambulatory Visit: Payer: Self-pay | Admitting: Nurse Practitioner

## 2020-02-08 ENCOUNTER — Other Ambulatory Visit: Payer: Self-pay

## 2020-02-08 ENCOUNTER — Ambulatory Visit (INDEPENDENT_AMBULATORY_CARE_PROVIDER_SITE_OTHER): Payer: 59 | Admitting: Nurse Practitioner

## 2020-02-08 ENCOUNTER — Encounter: Payer: Self-pay | Admitting: Nurse Practitioner

## 2020-02-08 VITALS — BP 136/53 | HR 79 | Temp 97.8°F | Ht 64.0 in | Wt 204.6 lb

## 2020-02-08 DIAGNOSIS — R29818 Other symptoms and signs involving the nervous system: Secondary | ICD-10-CM

## 2020-02-08 DIAGNOSIS — E782 Mixed hyperlipidemia: Secondary | ICD-10-CM

## 2020-02-08 DIAGNOSIS — F32A Depression, unspecified: Secondary | ICD-10-CM

## 2020-02-08 DIAGNOSIS — E118 Type 2 diabetes mellitus with unspecified complications: Secondary | ICD-10-CM

## 2020-02-08 DIAGNOSIS — I10 Essential (primary) hypertension: Secondary | ICD-10-CM

## 2020-02-08 LAB — POCT URINALYSIS DIPSTICK
Bilirubin, UA: NEGATIVE
Glucose, UA: NEGATIVE
Ketones, UA: NEGATIVE
Leukocytes, UA: NEGATIVE
Nitrite, UA: NEGATIVE
Protein, UA: POSITIVE — AB
Spec Grav, UA: >=1.03 — AB (ref 1.010–1.025)
Urobilinogen, UA: 0.2 E.U./dL
pH, UA: 6 (ref 5.0–8.0)

## 2020-02-08 LAB — POCT GLYCOSYLATED HEMOGLOBIN (HGB A1C)
HbA1c POC (<> result, manual entry): 6.2 % (ref 4.0–5.6)
HbA1c, POC (controlled diabetic range): 6.2 % (ref 0.0–7.0)
HbA1c, POC (prediabetic range): 6.2 % (ref 5.7–6.4)
Hemoglobin A1C: 6.2 % — AB (ref 4.0–5.6)

## 2020-02-08 NOTE — Patient Instructions (Signed)
Major Depressive Disorder, Adult Major depressive disorder is a mental health condition. This disorder affects feelings. It can also affect the body. Symptoms of this condition last most of the day, almost every day, for 2 weeks. This disorder can affect:  Relationships.  Daily activities, such as work and school.  Activities that you normally like to do. What are the causes? The cause of this condition is not known. The disorder is likely caused by a mix of things, including:  Your personality, such as being a shy person.  Your behavior, or how you act toward others.  Your thoughts and feelings.  Too much alcohol or drugs.  How you react to stress.  Health and mental problems that you have had for a long time.  Things that hurt you in the past (trauma).  Big changes in your life, such as divorce. What increases the risk? The following factors may make you more likely to develop this condition:  Having family members with depression.  Being a woman.  Problems in the family.  Low levels of some brain chemicals.  Things that caused you pain as a child, especially if you lost a parent or were abused.  A lot of stress in your life, such as from: ? Living without basic needs of life, such as food and shelter. ? Being treated poorly because of race, sex, or religion (discrimination).  Health and mental problems that you have had for a long time. What are the signs or symptoms? The main symptoms of this condition are:  Being sad all the time.  Being grouchy all the time.  Loss of interest in things and activities. Other symptoms include:  Sleeping too much or too little.  Eating too much or too little.  Gaining or losing weight, without knowing why.  Feeling tired or having low energy.  Being restless and weak.  Feeling hopeless, worthless, or guilty.  Trouble thinking clearly or making decisions.  Thoughts of hurting yourself or others, or thoughts of  ending your life.  Spending a lot of time alone.  Inability to complete common tasks of daily life. If you have very bad MDD, you may:  Believe things that are not true.  Hear, see, taste, or feel things that are not there.  Have mild depression that lasts for at least 2 years.  Feel very sad and hopeless.  Have trouble speaking or moving. How is this treated? This condition may be treated with:  Talk therapy. This teaches you to know bad thoughts, feelings, and actions and how to change them. ? This can also help you to communicate with others. ? This can be done with members of your family.  Medicines. These can be used to treat worry (anxiety), depression, or low levels of chemicals in the brain.  Lifestyle changes. You may need to: ? Limit alcohol use. ? Limit drug use. ? Get regular exercise. ? Get plenty of sleep. ? Make healthy eating choices. ? Spend more time outdoors.  Brain stimulation. This treatment excites the brain. This is done when symptoms are very bad or have not gotten better with other treatments. Follow these instructions at home: Activity  Get regular exercise as told.  Spend time outdoors as told.  Make time to do the things you enjoy.  Find ways to deal with stress. Try to: ? Meditate. ? Do deep breathing. ? Spend time in nature. ? Keep a journal.  Return to your normal activities as told by your doctor.   Ask your doctor what activities are safe for you. Alcohol and drug use  If you drink alcohol: ? Limit how much you use to:  0-1 drink a day for women.  0-2 drinks a day for men. ? Be aware of how much alcohol is in your drink. In the [place] one drink equals one 12 oz bottle of beer (355 mL), one 5 oz glass of wine (148 mL), or one 1 oz glass of hard liquor (44 mL).  Talk to your doctor about: ? Alcohol use. Alcohol can affect some medicines. ? Any drug use. General instructions  Take over-the-counter and prescription medicines  and herbal preparations only as told by your doctor.  Eat a healthy diet.  Get a lot of sleep.  Think about joining a support group. Your doctor may be able to suggest one.  Keep all follow-up visits as told by your doctor. This is important.   Where to find more information:  National Alliance on Mental Illness: www.nami.org  [place] National Institute of Mental Health: www.nimh.nih.gov  American Psychiatric Association: www.psychiatry.org/patients-families/ Contact a doctor if:  Your symptoms get worse.  You get new symptoms. Get help right away if:  You hurt yourself.  You have serious thoughts about hurting yourself or others.  You see, hear, taste, smell, or feel things that are not there. If you ever feel like you may hurt yourself or others, or have thoughts about taking your own life, get help right away. Go to your nearest emergency department or:  Call your local emergency services (911 in the [place]  Call a suicide crisis helpline, such as the National Suicide Prevention Lifeline at 1-800-273-8255. This is open 24 hours a day in the [place]  Text the Crisis Text Line at 741741 (in the [place] Summary  Major depressive disorder is a mental health condition. This disorder affects feelings. Symptoms of this condition last most of the day, almost every day, for 2 weeks.  The symptoms of this disorder can cause problems with relationships and with daily activities.  There are treatments and support for people who get this disorder. You may need more than one type of treatment.  Get help right away if you have serious thoughts about hurting yourself or others. This information is not intended to replace advice given to you by your health care provider. Make sure you discuss any questions you have with your health care provider. Document Revised: 12/09/2018 Document Reviewed: 12/09/2018 Elsevier Patient Education  2021 [employer]  

## 2020-02-08 NOTE — Progress Notes (Signed)
[place], [place]  [address] Phone:  [phone]   Fax:  [phone]   Established Patient Office Visit  Subjective:  Patient ID: Cynthia Patton, female    DOB: [birth year]  Age: 60 y.o. MRN: [id number]  CC:  Chief Complaint  Patient presents with  . Follow-up    3 month follow up     HPI Cynthia Patton presents for follow up for depression. She  has a past medical history of Anxiety, Cancer ([place]) (2000?), Depression, Diabetes mellitus without complication ([place]), Hyperlipidemia, Hypertension, and Thyroid disease.   She admits that she has to go in to the office. She works in a [employer] with one other employee. She is not happy with the current set up. She was working from her home.  She is currently on Citalopram 10 mg daily. She admits that it is working well. Previous treatment includes individual therapy and medication. She complains of the following side effects from the treatment: none.  Hypertension Patient is here for follow-up of elevated blood pressure. She is not exercising and is adherent to a low-salt diet. Cardiac symptoms: none. Patient denies chest pain, dyspnea, fatigue, irregular heart beat, lower extremity edema and syncope. Cardiovascular risk factors: diabetes mellitus, hypertension, obesity (BMI >= 30 kg/m2) and sedentary lifestyle. Use of agents associated with hypertension: none. History of target organ damage: none.  Past Medical History:  Diagnosis Date  . Anxiety   . Cancer ([place]) 2000?   thyroid cancer  . Depression   . Diabetes mellitus without complication ([place])   . Hyperlipidemia   . Hypertension   . Thyroid disease     Past Surgical History:  Procedure Laterality Date  . ABDOMINAL HYSTERECTOMY    . CHOLECYSTECTOMY    . INCISE AND DRAIN ABCESS     on right leg   . THYROIDECTOMY      Family History  Problem Relation Age of Onset  . Kidney disease Mother   . Hypertension Mother   .  Hypertension Sister   . Heart disease Brother   . Colon cancer Neg Hx   . Colon polyps Neg Hx   . Esophageal cancer Neg Hx   . Rectal cancer Neg Hx   . Stomach cancer Neg Hx     Social History   Socioeconomic History  . Marital status: Single    Spouse name: Not on file  . Number of children: Not on file  . Years of education: Not on file  . Highest education level: Not on file  Occupational History  . Not on file  Tobacco Use  . Smoking status: Current Every Day Smoker    Packs/day: 0.50    Types: Cigarettes  . Smokeless tobacco: Never Used  Vaping Use  . Vaping Use: Never used  Substance and Sexual Activity  . Alcohol use: No  . Drug use: No  . Sexual activity: Not Currently  Other Topics Concern  . Not on file  Social History Narrative  . Not on file   Social Determinants of Health   Financial Resource Strain: Not on file  Food Insecurity: Not on file  Transportation Needs: Not on file  Physical Activity: Not on file  Stress: Not on file  Social Connections: Not on file  Intimate Partner Violence: Not on file    Outpatient Medications Prior to Visit  Medication Sig Dispense Refill  . albuterol (PROVENTIL HFA;VENTOLIN HFA) 108 (90 Base) MCG/ACT inhaler  Inhale 2 puffs into the lungs every 4 (four) hours as needed for wheezing or shortness of breath. 1 Inhaler 1  . aspirin EC 81 MG tablet Take 1 tablet (81 mg total) by mouth daily. 30 tablet 11  . blood glucose meter kit and supplies KIT Dispense based on patient and insurance preference. Use up to four times daily as directed. (FOR ICD-9 250.00, 250.01). 1 each 0  . citalopram (CELEXA) 10 MG tablet Take 1 tablet (10 mg total) by mouth daily. 30 tablet 11  . dicyclomine (BENTYL) 10 MG capsule Take 1 capsule (10 mg total) by mouth 3 (three) times daily before meals. Dicyclomine TID AC x5 days, then TID PRN abdominal cramping/spasms 30 capsule 0  . diltiazem (DILT-XR) 180 MG 24 hr capsule Take 1 capsule (180 mg  total) by mouth daily. 90 capsule 3  . ibuprofen (ADVIL) 800 MG tablet Take 1 tablet (800 mg total) by mouth every 8 (eight) hours as needed. 30 tablet 3  . levothyroxine (SYNTHROID) 175 MCG tablet Take by mouth.    . losartan (COZAAR) 50 MG tablet Take 50 mg by mouth daily.    . mometasone (NASONEX) 50 MCG/ACT nasal spray Place 2 sprays into the nose daily. (Patient taking differently: Place 2 sprays into the nose daily as needed (nasal congestion).) 17 g 1  . ONETOUCH VERIO test strip USE UP TO 4 TIMES DAILY AS DIRECTED    . Semaglutide, 1 MG/DOSE, 2 MG/1.5ML SOPN Inject into the skin.    . Semaglutide,0.25 or 0.5MG/DOS, 2 MG/1.5ML SOPN Use 0.25 mg once weekly for 4 weeks, then increase to 0.5 mg once weekly    . levothyroxine (LEVOXYL) 175 MCG tablet Take 1 tablet (175 mcg total) by mouth daily before breakfast. 30 tablet 3   No facility-administered medications prior to visit.    Allergies  Allergen Reactions  . Floxin [Ocuflox] Other (See Comments)    Disoriented  . Ofloxacin Other (See Comments)    Disoriented  . Percocet [Oxycodone-Acetaminophen]     ROS Review of Systems    Objective:    Physical Exam HENT:     Head: Normocephalic and atraumatic.     Nose: Nose normal.     Mouth/Throat:     Mouth: Mucous membranes are moist.  Cardiovascular:     Rate and Rhythm: Normal rate and regular rhythm.     Pulses: Normal pulses.     Heart sounds: Normal heart sounds.  Pulmonary:     Effort: Pulmonary effort is normal.     Breath sounds: Normal breath sounds.  Abdominal:     Palpations: Abdomen is soft.  Musculoskeletal:     Cervical back: Normal range of motion.     Right lower leg: No edema.     Left lower leg: No edema.  Skin:    General: Skin is warm and dry.     Capillary Refill: Capillary refill takes less than 2 seconds.  Neurological:     General: No focal deficit present.     Mental Status: She is alert and oriented to person, place, and time.  Psychiatric:         Mood and Affect: Mood normal.        Behavior: Behavior normal.        Thought Content: Thought content normal.        Judgment: Judgment normal.     BP (!) 136/53   Pulse 79   Temp 97.8 F (36.6 C) (Temporal)  Ht '5\' 4"'  (1.626 m)   Wt 204 lb 9.6 oz (92.8 kg)   SpO2 98%   BMI 35.12 kg/m  Wt Readings from Last 3 Encounters:  02/08/20 204 lb 9.6 oz (92.8 kg)  11/01/19 206 lb (93.4 kg)  10/05/19 208 lb (94.3 kg)     Health Maintenance Due  Topic Date Due  . PAP SMEAR-Modifier  Never done  . COVID-19 Vaccine (3 - Pfizer risk 4-dose series) 05/14/2019    There are no preventive care reminders to display for this patient.  Lab Results  Component Value Date   TSH 14.280 (H) 07/13/2018   Lab Results  Component Value Date   WBC 9.7 07/27/2019   HGB 12.9 07/27/2019   HCT 38.8 07/27/2019   MCV 92 07/27/2019   PLT 356 07/27/2019   Lab Results  Component Value Date   NA 142 07/27/2019   K 4.0 07/27/2019   CO2 26 06/03/2019   GLUCOSE 106 (H) 07/27/2019   BUN 10 07/27/2019   CREATININE 0.89 07/27/2019   BILITOT 0.4 07/27/2019   ALKPHOS 94 07/27/2019   AST 10 07/27/2019   ALT 13 02/12/2019   PROT 7.2 07/27/2019   ALBUMIN 4.3 07/27/2019   CALCIUM 9.3 07/27/2019   ANIONGAP 12 06/03/2019   Lab Results  Component Value Date   CHOL 171 07/27/2019   Lab Results  Component Value Date   HDL 37 (L) 07/27/2019   Lab Results  Component Value Date   LDLCALC 113 (H) 07/27/2019   Lab Results  Component Value Date   TRIG 113 07/27/2019   Lab Results  Component Value Date   CHOLHDL 4.6 (H) 07/27/2019   Lab Results  Component Value Date   HGBA1C 6.2 (A) 02/08/2020   HGBA1C 6.2 02/08/2020   HGBA1C 6.2 02/08/2020   HGBA1C 6.2 02/08/2020      Assessment & Plan:   Problem List Items Addressed This Visit      Endocrine   Controlled type 2 diabetes mellitus with complication, without long-term current use of insulin (HCC) - Primary   Relevant  Medications   Semaglutide, 1 MG/DOSE, 2 MG/1.5ML SOPN   Other Relevant Orders   HgB A1c (Completed)   POCT Urinalysis Dipstick (Completed)      No orders of the defined types were placed in this encounter.   Follow-up: No follow-ups on file.    Vevelyn Francois, NP

## 2020-02-09 LAB — LIPID PANEL
Chol/HDL Ratio: 3.7 ratio (ref 0.0–4.4)
Cholesterol, Total: 162 mg/dL (ref 100–199)
HDL: 44 mg/dL (ref >39)
LDL Chol Calc (NIH): 100 mg/dL — ABNORMAL HIGH (ref 0–99)
Triglycerides: 97 mg/dL (ref 0–149)
VLDL Cholesterol Cal: 18 mg/dL (ref 5–40)

## 2020-02-21 ENCOUNTER — Ambulatory Visit (INDEPENDENT_AMBULATORY_CARE_PROVIDER_SITE_OTHER): Payer: 59 | Admitting: Nurse Practitioner

## 2020-02-21 ENCOUNTER — Encounter: Payer: Self-pay | Admitting: Nurse Practitioner

## 2020-02-21 ENCOUNTER — Other Ambulatory Visit: Payer: Self-pay

## 2020-02-21 VITALS — BP 156/60 | HR 85 | Ht 64.0 in | Wt 207.0 lb

## 2020-02-21 DIAGNOSIS — R059 Cough, unspecified: Secondary | ICD-10-CM | POA: Diagnosis not present

## 2020-02-21 DIAGNOSIS — R42 Dizziness and giddiness: Secondary | ICD-10-CM | POA: Diagnosis not present

## 2020-02-21 DIAGNOSIS — J40 Bronchitis, not specified as acute or chronic: Secondary | ICD-10-CM | POA: Diagnosis not present

## 2020-02-21 DIAGNOSIS — Z77128 Contact with and (suspected) exposure to other hazards in the physical environment: Secondary | ICD-10-CM

## 2020-02-21 MED ORDER — ALBUTEROL SULFATE (2.5 MG/3ML) 0.083% IN NEBU: 2.5000 mg | INHALATION_SOLUTION | RESPIRATORY_TRACT | 2 refills | Status: DC | PRN
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
## 2020-02-21 NOTE — Patient Instructions (Signed)
Dizziness Dizziness is a common problem. It is a feeling of unsteadiness or light-headedness. You may feel like you are about to faint. Dizziness can lead to injury if you stumble or fall. Anyone can become dizzy, but dizziness is more common in older adults. This condition can be caused by a number of things, including medicines, dehydration, or illness. Follow these instructions at home: Eating and drinking  Drink enough fluid to keep your urine clear or pale yellow. This helps to keep you from becoming dehydrated. Try to drink more clear fluids, such as water.  Do not drink alcohol.  Limit your caffeine intake if told to do so by your health care provider. Check ingredients and nutrition facts to see if a food or beverage contains caffeine.  Limit your salt (sodium) intake if told to do so by your health care provider. Check ingredients and nutrition facts to see if a food or beverage contains sodium. Activity  Avoid making quick movements. ? Rise slowly from chairs and steady yourself until you feel okay. ? In the morning, first sit up on the side of the bed. When you feel okay, stand slowly while you hold onto something until you know that your balance is fine.  If you need to stand in one place for a long time, move your legs often. Tighten and relax the muscles in your legs while you are standing.  Do not drive or use heavy machinery if you feel dizzy.  Avoid bending down if you feel dizzy. Place items in your home so that they are easy for you to reach without leaning over. Lifestyle  Do not use any products that contain nicotine or tobacco, such as cigarettes and e-cigarettes. If you need help quitting, ask your health care provider.  Try to reduce your stress level by using methods such as yoga or meditation. Talk with your health care provider if you need help to manage your stress. General instructions  Watch your dizziness for any changes.  Take over-the-counter and  prescription medicines only as told by your health care provider. Talk with your health care provider if you think that your dizziness is caused by a medicine that you are taking.  Tell a friend or a family member that you are feeling dizzy. If he or she notices any changes in your behavior, have this person call your health care provider.  Keep all follow-up visits as told by your health care provider. This is important. Contact a health care provider if:  Your dizziness does not go away.  Your dizziness or light-headedness gets worse.  You feel nauseous.  You have reduced hearing.  You have new symptoms.  You are unsteady on your feet or you feel like the room is spinning. Get help right away if:  You vomit or have diarrhea and are unable to eat or drink anything.  You have problems talking, walking, swallowing, or using your arms, hands, or legs.  You feel generally weak.  You are not thinking clearly or you have trouble forming sentences. It may take a friend or family member to notice this.  You have chest pain, abdominal pain, shortness of breath, or sweating.  Your vision changes.  You have any bleeding.  You have a severe headache.  You have neck pain or a stiff neck.  You have a fever. These symptoms may represent a serious problem that is an emergency. Do not wait to see if the symptoms will go away. Get medical help   right away. Call your local emergency services (911 in the [place] Do not drive yourself to the hospital. Summary  Dizziness is a feeling of unsteadiness or light-headedness. This condition can be caused by a number of things, including medicines, dehydration, or illness.  Anyone can become dizzy, but dizziness is more common in older adults.  Drink enough fluid to keep your urine clear or pale yellow. Do not drink alcohol.  Avoid making quick movements if you feel dizzy. Monitor your dizziness for any changes. This information is not intended to  replace advice given to you by your health care provider. Make sure you discuss any questions you have with your health care provider. Document Revised: 12/31/2016 Document Reviewed: 01/31/2016 Elsevier Patient Education  2021 [employer]  

## 2020-02-22 ENCOUNTER — Other Ambulatory Visit: Payer: Self-pay

## 2020-02-22 ENCOUNTER — Ambulatory Visit (HOSPITAL_COMMUNITY)
Admission: RE | Admit: 2020-02-22 | Discharge: 2020-02-22 | Disposition: A | Discharge status: Home / Self Care | Payer: 59 | Source: Ambulatory Visit | Attending: Nurse Practitioner | Admitting: Nurse Practitioner

## 2020-02-22 DIAGNOSIS — R059 Cough, unspecified: Secondary | ICD-10-CM | POA: Insufficient documentation

## 2020-02-23 ENCOUNTER — Encounter: Payer: Self-pay | Admitting: Nurse Practitioner

## 2020-03-25 ENCOUNTER — Encounter: Payer: Self-pay | Admitting: Family Medicine

## 2020-03-25 ENCOUNTER — Other Ambulatory Visit: Payer: Self-pay

## 2020-03-25 ENCOUNTER — Ambulatory Visit (INDEPENDENT_AMBULATORY_CARE_PROVIDER_SITE_OTHER): Payer: 59 | Admitting: Family Medicine

## 2020-03-25 VITALS — BP 160/66 | HR 80 | Temp 97.7°F | Ht 64.0 in | Wt 209.0 lb

## 2020-03-25 DIAGNOSIS — M545 Low back pain, unspecified: Secondary | ICD-10-CM

## 2020-03-25 DIAGNOSIS — M5442 Lumbago with sciatica, left side: Secondary | ICD-10-CM | POA: Diagnosis not present

## 2020-03-25 DIAGNOSIS — Z09 Encounter for follow-up examination after completed treatment for conditions other than malignant neoplasm: Secondary | ICD-10-CM | POA: Diagnosis not present

## 2020-03-25 DIAGNOSIS — M62838 Other muscle spasm: Secondary | ICD-10-CM

## 2020-03-25 HISTORY — DX: Low back pain, unspecified: M54.50

## 2020-03-25 MED ORDER — CYCLOBENZAPRINE HCL 10 MG PO TABS: 10.0000 mg | ORAL_TABLET | Freq: Three times a day (TID) | ORAL | 1 refills | Status: DC | PRN

## 2020-03-25 MED ORDER — GABAPENTIN 100 MG PO CAPS: 100.0000 mg | ORAL_CAPSULE | Freq: Two times a day (BID) | ORAL | 1 refills | Status: DC

## 2020-03-25 NOTE — Patient Instructions (Signed)
Gabapentin capsules or tablets What is this medicine? GABAPENTIN (GA ba pen tin) is used to control seizures in certain types of epilepsy. It is also used to treat certain types of nerve pain. This medicine may be used for other purposes; ask your health care provider or pharmacist if you have questions. COMMON BRAND NAME(S): Active-PAC with Gabapentin, Orpha Bur, Gralise, Neurontin What should I tell my health care provider before I take this medicine? They need to know if you have any of these conditions:  history of drug abuse or alcohol abuse problem  kidney disease  lung or breathing disease  suicidal thoughts, plans, or attempt; a previous suicide attempt by you or a family member  an unusual or allergic reaction to gabapentin, other medicines, foods, dyes, or preservatives  pregnant or trying to get pregnant  breast-feeding How should I use this medicine? Take this medicine by mouth with a glass of water. Follow the directions on the prescription label. You can take it with or without food. If it upsets your stomach, take it with food. Take your medicine at regular intervals. Do not take it more often than directed. Do not stop taking except on your doctor's advice. If you are directed to break the 600 or 800 mg tablets in half as part of your dose, the extra half tablet should be used for the next dose. If you have not used the extra half tablet within 28 days, it should be thrown away. A special MedGuide will be given to you by the pharmacist with each prescription and refill. Be sure to read this information carefully each time. Talk to your pediatrician regarding the use of this medicine in children. While this drug may be prescribed for children as young as 3 years for selected conditions, precautions do apply. Overdosage: If you think you have taken too much of this medicine contact a poison control center or emergency room at once. NOTE: This medicine is only for you. Do not  share this medicine with others. What if I miss a dose? If you miss a dose, take it as soon as you can. If it is almost time for your next dose, take only that dose. Do not take double or extra doses. What may interact with this medicine? This medicine may interact with the following medications:  alcohol  antihistamines for allergy, cough, and cold  certain medicines for anxiety or sleep  certain medicines for depression like amitriptyline, fluoxetine, sertraline  certain medicines for seizures like phenobarbital, primidone  certain medicines for stomach problems  general anesthetics like halothane, isoflurane, methoxyflurane, propofol  local anesthetics like lidocaine, pramoxine, tetracaine  medicines that relax muscles for surgery  narcotic medicines for pain  phenothiazines like chlorpromazine, mesoridazine, prochlorperazine, thioridazine This list may not describe all possible interactions. Give your health care provider a list of all the medicines, herbs, non-prescription drugs, or dietary supplements you use. Also tell them if you smoke, drink alcohol, or use illegal drugs. Some items may interact with your medicine. What should I watch for while using this medicine? Visit your doctor or health care provider for regular checks on your progress. You may want to keep a record at home of how you feel your condition is responding to treatment. You may want to share this information with your doctor or health care provider at each visit. You should contact your doctor or health care provider if your seizures get worse or if you have any new types of seizures. Do not stop  taking this medicine or any of your seizure medicines unless instructed by your doctor or health care provider. Stopping your medicine suddenly can increase your seizures or their severity. This medicine may cause serious skin reactions. They can happen weeks to months after starting the medicine. Contact your health  care provider right away if you notice fevers or flu-like symptoms with a rash. The rash may be red or purple and then turn into blisters or peeling of the skin. Or, you might notice a red rash with swelling of the face, lips or lymph nodes in your neck or under your arms. Wear a medical identification bracelet or chain if you are taking this medicine for seizures, and carry a card that lists all your medications. You may get drowsy, dizzy, or have blurred vision. Do not drive, use machinery, or do anything that needs mental alertness until you know how this medicine affects you. To reduce dizzy or fainting spells, do not sit or stand up quickly, especially if you are an older patient. Alcohol can increase drowsiness and dizziness. Avoid alcoholic drinks. Your mouth may get dry. Chewing sugarless gum or sucking hard candy, and drinking plenty of water will help. The use of this medicine may increase the chance of suicidal thoughts or actions. Pay special attention to how you are responding while on this medicine. Any worsening of mood, or thoughts of suicide or dying should be reported to your health care provider right away. Women who become pregnant while using this medicine may enroll in the [place] Pregnancy Registry by calling [phone]. This registry collects information about the safety of antiepileptic drug use during pregnancy. What side effects may I notice from receiving this medicine? Side effects that you should report to your doctor or health care professional as soon as possible:  allergic reactions like skin rash, itching or hives, swelling of the face, lips, or tongue  breathing problems  rash, fever, and swollen lymph nodes  redness, blistering, peeling or loosening of the skin, including inside the mouth  suicidal thoughts, mood changes Side effects that usually do not require medical attention (report to your doctor or health care professional if  they continue or are bothersome):  dizziness  drowsiness  headache  nausea, vomiting  swelling of ankles, feet, hands  tiredness This list may not describe all possible side effects. Call your doctor for medical advice about side effects. You may report side effects to FDA at 1-800-FDA-1088. Where should I keep my medicine? Keep out of reach of children. This medicine may cause accidental overdose and death if it taken by other adults, children, or pets. Mix any unused medicine with a substance like cat litter or coffee grounds. Then throw the medicine away in a sealed container like a sealed bag or a coffee can with a lid. Do not use the medicine after the expiration date. Store at room temperature between 15 and 30 degrees C (59 and 86 degrees F). NOTE: This sheet is a summary. It may not cover all possible information. If you have questions about this medicine, talk to your doctor, pharmacist, or health care provider.  2021 Elsevier/Gold Standard (2018-03-31 14:16:43) Cyclobenzaprine tablets What is this medicine? CYCLOBENZAPRINE (sye kloe BEN za preen) is a muscle relaxer. It is used to treat muscle pain, spasms, and stiffness. This medicine may be used for other purposes; ask your health care provider or pharmacist if you have questions. COMMON BRAND NAME(S): Fexmid, Flexeril What should I tell my  health care provider before I take this medicine? They need to know if you have any of these conditions:  heart disease, irregular heartbeat, or previous heart attack  liver disease  thyroid problem  an unusual or allergic reaction to cyclobenzaprine, tricyclic antidepressants, lactose, other medicines, foods, dyes, or preservatives  pregnant or trying to get pregnant  breast-feeding How should I use this medicine? Take this medicine by mouth with a glass of water. Follow the directions on the prescription label. If this medicine upsets your stomach, take it with food or milk.  Take your medicine at regular intervals. Do not take it more often than directed. Talk to your pediatrician regarding the use of this medicine in children. Special care may be needed. Overdosage: If you think you have taken too much of this medicine contact a poison control center or emergency room at once. NOTE: This medicine is only for you. Do not share this medicine with others. What if I miss a dose? If you miss a dose, take it as soon as you can. If it is almost time for your next dose, take only that dose. Do not take double or extra doses. What may interact with this medicine? Do not take this medicine with any of the following medications:  MAOIs like Carbex, Eldepryl, Marplan, Nardil, and Parnate  narcotic medicines for cough  safinamide This medicine may also interact with the following medications:  alcohol  bupropion  antihistamines for allergy, cough and cold  certain medicines for anxiety or sleep  certain medicines for bladder problems like oxybutynin, tolterodine  certain medicines for depression like amitriptyline, fluoxetine, sertraline  certain medicines for Parkinson's disease like benztropine, trihexyphenidyl  certain medicines for seizures like phenobarbital, primidone  certain medicines for stomach problems like dicyclomine, hyoscyamine  certain medicines for travel sickness like scopolamine  general anesthetics like halothane, isoflurane, methoxyflurane, propofol  ipratropium  local anesthetics like lidocaine, pramoxine, tetracaine  medicines that relax muscles for surgery  narcotic medicines for pain  phenothiazines like chlorpromazine, mesoridazine, prochlorperazine, thioridazine  verapamil This list may not describe all possible interactions. Give your health care provider a list of all the medicines, herbs, non-prescription drugs, or dietary supplements you use. Also tell them if you smoke, drink alcohol, or use illegal drugs. Some items  may interact with your medicine. What should I watch for while using this medicine? Tell your doctor or health care professional if your symptoms do not start to get better or if they get worse. You may get drowsy or dizzy. Do not drive, use machinery, or do anything that needs mental alertness until you know how this medicine affects you. Do not stand or sit up quickly, especially if you are an older patient. This reduces the risk of dizzy or fainting spells. Alcohol may interfere with the effect of this medicine. Avoid alcoholic drinks. If you are taking another medicine that also causes drowsiness, you may have more side effects. Give your health care provider a list of all medicines you use. Your doctor will tell you how much medicine to take. Do not take more medicine than directed. Call emergency for help if you have problems breathing or unusual sleepiness. Your mouth may get dry. Chewing sugarless gum or sucking hard candy, and drinking plenty of water may help. Contact your doctor if the problem does not go away or is severe. What side effects may I notice from receiving this medicine? Side effects that you should report to your doctor or health care  professional as soon as possible:  allergic reactions like skin rash, itching or hives, swelling of the face, lips, or tongue  breathing problems  chest pain  fast, irregular heartbeat  hallucinations  seizures  unusually weak or tired Side effects that usually do not require medical attention (report to your doctor or health care professional if they continue or are bothersome):  headache  nausea, vomiting This list may not describe all possible side effects. Call your doctor for medical advice about side effects. You may report side effects to FDA at 1-800-FDA-1088. Where should I keep my medicine? Keep out of the reach of children. Store at room temperature between 15 and 30 degrees C (59 and 86 degrees F). Keep container  tightly closed. Throw away any unused medicine after the expiration date. NOTE: This sheet is a summary. It may not cover all possible information. If you have questions about this medicine, talk to your doctor, pharmacist, or health care provider.  2021 Elsevier/Gold Standard (2017-11-30 12:49:26) Acute Back Pain, Adult Acute back pain is sudden and usually short-lived. It is often caused by an injury to the muscles and tissues in the back. The injury may result from:  A muscle or ligament getting overstretched or torn (strained). Ligaments are tissues that connect bones to each other. Lifting something improperly can cause a back strain.  Wear and tear (degeneration) of the spinal disks. Spinal disks are circular tissue that provide cushioning between the bones of the spine (vertebrae).  Twisting motions, such as while playing sports or doing yard work.  A hit to the back.  Arthritis. You may have a physical exam, lab tests, and imaging tests to find the cause of your pain. Acute back pain usually goes away with rest and home care. Follow these instructions at home: Managing pain, stiffness, and swelling  Treatment may include medicines for pain and inflammation that are taken by mouth or applied to the skin, prescription pain medicine, or muscle relaxants. Take over-the-counter and prescription medicines only as told by your health care provider.  Your health care provider may recommend applying ice during the first 24-48 hours after your pain starts. To do this: ? Put ice in a plastic bag. ? Place a towel between your skin and the bag. ? Leave the ice on for 20 minutes, 2-3 times a day.  If directed, apply heat to the affected area as often as told by your health care provider. Use the heat source that your health care provider recommends, such as a moist heat pack or a heating pad. ? Place a towel between your skin and the heat source. ? Leave the heat on for 20-30  minutes. ? Remove the heat if your skin turns bright red. This is especially important if you are unable to feel pain, heat, or cold. You have a greater risk of getting burned. Activity  Do not stay in bed. Staying in bed for more than 1-2 days can delay your recovery.  Sit up and stand up straight. Avoid leaning forward when you sit or hunching over when you stand. ? If you work at a desk, sit close to it so you do not need to lean over. Keep your chin tucked in. Keep your neck drawn back, and keep your elbows bent at a 90-degree angle (right angle). ? Sit high and close to the steering wheel when you drive. Add lower back (lumbar) support to your car seat, if needed.  Take short walks on even surfaces  as soon as you are able. Try to increase the length of time you walk each day.  Do not sit, drive, or stand in one place for more than 30 minutes at a time. Sitting or standing for long periods of time can put stress on your back.  Do not drive or use heavy machinery while taking prescription pain medicine.  Use proper lifting techniques. When you bend and lift, use positions that put less stress on your back: ? [place] your knees. ? Keep the load close to your body. ? Avoid twisting.  Exercise regularly as told by your health care provider. Exercising helps your back heal faster and helps prevent back injuries by keeping muscles strong and flexible.  Work with a physical therapist to make a safe exercise program, as recommended by your health care provider. Do any exercises as told by your physical therapist.   Lifestyle  Maintain a healthy weight. Extra weight puts stress on your back and makes it difficult to have good posture.  Avoid activities or situations that make you feel anxious or stressed. Stress and anxiety increase muscle tension and can make back pain worse. Learn ways to manage anxiety and stress, such as through exercise. General instructions  Sleep on a firm mattress in a  comfortable position. Try lying on your side with your knees slightly bent. If you lie on your back, put a pillow under your knees.  Follow your treatment plan as told by your health care provider. This may include: ? Cognitive or behavioral therapy. ? Acupuncture or massage therapy. ? Meditation or yoga. Contact a health care provider if:  You have pain that is not relieved with rest or medicine.  You have increasing pain going down into your legs or buttocks.  Your pain does not improve after 2 weeks.  You have pain at night.  You lose weight without trying.  You have a fever or chills. Get help right away if:  You develop new bowel or bladder control problems.  You have unusual weakness or numbness in your arms or legs.  You develop nausea or vomiting.  You develop abdominal pain.  You feel faint. Summary  Acute back pain is sudden and usually short-lived.  Use proper lifting techniques. When you bend and lift, use positions that put less stress on your back.  Take over-the-counter and prescription medicines and apply heat or ice as directed by your health care provider. This information is not intended to replace advice given to you by your health care provider. Make sure you discuss any questions you have with your health care provider. Document Revised: 09/21/2019 Document Reviewed: 09/21/2019 Elsevier Patient Education  2021 [place]. Muscle Cramps and Spasms Muscle cramps and spasms are when muscles tighten by themselves. They usually get better within minutes. Muscle cramps are painful. They are usually stronger and last longer than muscle spasms. Muscle spasms may or may not be painful. They can last a few seconds or much longer. Cramps and spasms can affect any muscle, but they occur most often in the calf muscles of the leg. They are usually not caused by a serious problem. In many cases, the cause is not known. Some common causes include:  Doing more  physical work or exercise than your body is ready for.  Using the muscles too much (overuse) by repeating certain movements too many times.  Staying in a certain position for a long time.  Playing a sport or doing an activity without preparing  properly.  Using bad form or technique while playing a sport or doing an activity.  Not having enough water in your body (dehydration).  Injury.  Side effects of some medicines.  Low levels of the salts and minerals in your blood (electrolytes), such as low potassium or calcium. Follow these instructions at home: Managing pain and stiffness  Massage, stretch, and relax the muscle. Do this for many minutes at a time.  If told, put heat on tight or tense muscles as often as told by your doctor. Use the heat source that your doctor recommends, such as a moist heat pack or a heating pad. ? Place a towel between your skin and the heat source. ? Leave the heat on for 20-30 minutes. ? Remove the heat if your skin turns bright red. This is very important if you are not able to feel pain, heat, or cold. You may have a greater risk of getting burned.  If told, put ice on the affected area. This may help if you are sore or have pain after a cramp or spasm. ? Put ice in a plastic bag. ? Place a towel between your skin and the bag. ? Leave the ice on for 20 minutes, 2-3 times a day.  Try taking hot showers or baths to help relax tight muscles.      Eating and drinking  Drink enough fluid to keep your pee (urine) pale yellow.  Eat a healthy diet to help ensure that your muscles work well. This should include: ? Fruits and vegetables. ? Lean protein. ? Whole grains. ? Low-fat or nonfat dairy products. General instructions  If you are having cramps often, avoid intense exercise for several days.  Take over-the-counter and prescription medicines only as told by your doctor.  Watch for any changes in your symptoms.  Keep all follow-up visits  as told by your doctor. This is important. Contact a doctor if:  Your cramps or spasms get worse or happen more often.  Your cramps or spasms do not get better with time. Summary  Muscle cramps and spasms are when muscles tighten by themselves. They usually get better within minutes.  Cramps and spasms occur most often in the calf muscles of the leg.  Massage, stretch, and relax the muscle. This may help the cramp or spasm go away.  Drink enough fluid to keep your pee (urine) pale yellow. This information is not intended to replace advice given to you by your health care provider. Make sure you discuss any questions you have with your health care provider. Document Revised: 05/23/2017 Document Reviewed: 05/23/2017 Elsevier Patient Education  [place].

## 2020-03-25 NOTE — Progress Notes (Signed)
Patient [facility]   Sick Visit  Subjective:  Patient ID: Cynthia Patton, female    DOB: 1960/10/20  Age: 60 y.o. MRN: [id number]  CC:  Chief Complaint  Patient presents with  . Back Pain    Injury lower on Saturday ,reach down to clean the toilet felt some pop on the left side, pain when walk, pain on outer thigh   , was taking otc meds, stop taking because diabetes.    HPI Cynthia Patton is a 60 year old female who presents for Sick Visit today.   Patient Active Problem List   Diagnosis Date Noted  . Thyroid cancer ([place]) 04/23/2019  . Hemoglobin A1C between 7% and 9% indicating borderline diabetic control 10/18/2018  . Positive ANA (antinuclear antibody) 04/06/2016  . Hyperlipidemia 03/19/2016  . Controlled type 2 diabetes mellitus with complication, without long-term current use of insulin ([place]) 02/09/2016  . Essential hypertension 02/09/2016  . Health care maintenance 02/24/2015   Current Status: This will be Ms. Dornfeld's initial office visit with me. She is currentlly seeing Dionisio David, NP for her PCP needs. Since heer last office visit, she has c/o acute back pain, which she states that she sustained when she extended her reach for an item in her bathroom. She states that pain/numbnes radiates to left leg and thigh area. She denies fevers, chills, fatigue, recent infections, weight loss, and night sweats. Denies GI problems such as nausea, vomiting, diarrhea, and constipation. She has no reports of blood in stools, dysuria and hematuria. She is taking all medications as prescribed.   Past Medical History:  Diagnosis Date  . Anxiety   . Cancer ([place]) 2000?   thyroid cancer  . Depression   . Diabetes mellitus without complication ([place])   . Hyperlipidemia   . Hypertension   . Thyroid disease     Past Surgical History:  Procedure Laterality Date  . ABDOMINAL HYSTERECTOMY    . CHOLECYSTECTOMY    . INCISE AND DRAIN ABCESS      on right leg   . THYROIDECTOMY      Family History  Problem Relation Age of Onset  . Kidney disease Mother   . Hypertension Mother   . Hypertension Sister   . Heart disease Brother   . Colon cancer Neg Hx   . Colon polyps Neg Hx   . Esophageal cancer Neg Hx   . Rectal cancer Neg Hx   . Stomach cancer Neg Hx     Social History   Socioeconomic History  . Marital status: Single    Spouse name: Not on file  . Number of children: Not on file  . Years of education: Not on file  . Highest education level: Not on file  Occupational History  . Not on file  Tobacco Use  . Smoking status: Current Every Day Smoker    Packs/day: 0.50    Types: Cigarettes  . Smokeless tobacco: Never Used  Vaping Use  . Vaping Use: Never used  Substance and Sexual Activity  . Alcohol use: No  . Drug use: No  . Sexual activity: Not Currently  Other Topics Concern  . Not on file  Social History Narrative  . Not on file   Social Determinants of Health   Financial Resource Strain: Not on file  Food Insecurity: Not on file  Transportation Needs: Not on file  Physical Activity: Not on file  Stress: Not on file  Social  Connections: Not on file  Intimate Partner Violence: Not on file    Outpatient Medications Prior to Visit  Medication Sig Dispense Refill  . albuterol (PROVENTIL) (2.5 MG/3ML) 0.083% nebulizer solution Take 3 mLs (2.5 mg total) by nebulization every 4 (four) hours as needed for wheezing or shortness of breath. 75 mL 2  . aspirin EC 81 MG tablet Take 1 tablet (81 mg total) by mouth daily. 30 tablet 11  . blood glucose meter kit and supplies KIT Dispense based on patient and insurance preference. Use up to four times daily as directed. (FOR ICD-9 250.00, 250.01). 1 each 0  . citalopram (CELEXA) 10 MG tablet Take 1 tablet (10 mg total) by mouth daily. 30 tablet 11  . diltiazem (DILT-XR) 180 MG 24 hr capsule Take 1 capsule (180 mg total) by mouth daily. 90 capsule 3  . levothyroxine  (SYNTHROID) 175 MCG tablet Take by mouth.    . losartan (COZAAR) 50 MG tablet Take 50 mg by mouth daily.    . mometasone (NASONEX) 50 MCG/ACT nasal spray Place 2 sprays into the nose daily. (Patient taking differently: Place 2 sprays into the nose daily as needed (nasal congestion).) 17 g 1  . ONETOUCH VERIO test strip USE UP TO 4 TIMES DAILY AS DIRECTED    . Semaglutide, 1 MG/DOSE, 2 MG/1.5ML SOPN Inject into the skin.    Marland Kitchen dicyclomine (BENTYL) 10 MG capsule Take 1 capsule (10 mg total) by mouth 3 (three) times daily before meals. Dicyclomine TID AC x5 days, then TID PRN abdominal cramping/spasms (Patient not taking: Reported on 03/25/2020) 30 capsule 0  . ibuprofen (ADVIL) 800 MG tablet Take 1 tablet (800 mg total) by mouth every 8 (eight) hours as needed. (Patient not taking: Reported on 03/25/2020) 30 tablet 3  . Semaglutide,0.25 or 0.5MG/DOS, 2 MG/1.5ML SOPN Use 0.25 mg once weekly for 4 weeks, then increase to 0.5 mg once weekly (Patient not taking: Reported on 03/25/2020)     No facility-administered medications prior to visit.    Allergies  Allergen Reactions  . Floxin [Ocuflox] Other (See Comments)    Disoriented  . Ofloxacin Other (See Comments)    Disoriented  . Percocet [Oxycodone-Acetaminophen]     ROS Review of Systems  Constitutional: Negative.   Eyes: Negative.   Respiratory: Negative.   Cardiovascular: Negative.   Gastrointestinal: Negative.   Musculoskeletal: Positive for back pain (acute back pain).  Psychiatric/Behavioral: Negative.     Objective:    Physical Exam Vitals and nursing note reviewed.  Constitutional:      Appearance: Normal appearance.  Cardiovascular:     Rate and Rhythm: Normal rate and regular rhythm.     Pulses: Normal pulses.     Heart sounds: Normal heart sounds.  Pulmonary:     Effort: Pulmonary effort is normal.     Breath sounds: Normal breath sounds.  Skin:    General: Skin is warm and dry.  Neurological:     Mental Status: She  is alert.    BP (!) 160/66 (BP Location: Right Arm, Patient Position: Sitting, Cuff Size: Large)   Pulse 80   Temp 97.7 F (36.5 C) (Temporal)   Ht '5\' 4"'  (1.626 m)   Wt 209 lb (94.8 kg)   SpO2 97%   BMI 35.87 kg/m  Wt Readings from Last 3 Encounters:  03/25/20 209 lb (94.8 kg)  02/21/20 207 lb (93.9 kg)  02/08/20 204 lb 9.6 oz (92.8 kg)     Health Maintenance Due  Topic Date Due  . PAP SMEAR-Modifier  Never done    There are no preventive care reminders to display for this patient.  Lab Results  Component Value Date   TSH 14.280 (H) 07/13/2018   Lab Results  Component Value Date   WBC 9.7 07/27/2019   HGB 12.9 07/27/2019   HCT 38.8 07/27/2019   MCV 92 07/27/2019   PLT 356 07/27/2019   Lab Results  Component Value Date   NA 142 07/27/2019   K 4.0 07/27/2019   CO2 26 06/03/2019   GLUCOSE 106 (H) 07/27/2019   BUN 10 07/27/2019   CREATININE 0.89 07/27/2019   BILITOT 0.4 07/27/2019   ALKPHOS 94 07/27/2019   AST 10 07/27/2019   ALT 13 02/12/2019   PROT 7.2 07/27/2019   ALBUMIN 4.3 07/27/2019   CALCIUM 9.3 07/27/2019   ANIONGAP 12 06/03/2019   Lab Results  Component Value Date   CHOL 162 02/08/2020   Lab Results  Component Value Date   HDL 44 02/08/2020   Lab Results  Component Value Date   LDLCALC 100 (H) 02/08/2020   Lab Results  Component Value Date   TRIG 97 02/08/2020   Lab Results  Component Value Date   CHOLHDL 3.7 02/08/2020   Lab Results  Component Value Date   HGBA1C 6.2 (A) 02/08/2020   HGBA1C 6.2 02/08/2020   HGBA1C 6.2 02/08/2020   HGBA1C 6.2 02/08/2020   Assessment & Plan:   1. Acute low back pain with left-sided sciatica, unspecified back pain laterality - gabapentin (NEURONTIN) 100 MG capsule; Take 1 capsule (100 mg total) by mouth 2 (two) times daily.  Dispense: 60 capsule; Refill: 1 - cyclobenzaprine (FLEXERIL) 10 MG tablet; Take 1 tablet (10 mg total) by mouth 3 (three) times daily as needed for muscle spasms.   Dispense: 60 tablet; Refill: 1  2. Muscle spasm of left lower extremity - gabapentin (NEURONTIN) 100 MG capsule; Take 1 capsule (100 mg total) by mouth 2 (two) times daily.  Dispense: 60 capsule; Refill: 1 - cyclobenzaprine (FLEXERIL) 10 MG tablet; Take 1 tablet (10 mg total) by mouth 3 (three) times daily as needed for muscle spasms.  Dispense: 60 tablet; Refill: 1  3. Follow up She will keep follow up appointment with Dionisio David, NP.   Meds ordered this encounter  Medications  . gabapentin (NEURONTIN) 100 MG capsule    Sig: Take 1 capsule (100 mg total) by mouth 2 (two) times daily.    Dispense:  60 capsule    Refill:  1  . cyclobenzaprine (FLEXERIL) 10 MG tablet    Sig: Take 1 tablet (10 mg total) by mouth 3 (three) times daily as needed for muscle spasms.    Dispense:  60 tablet    Refill:  1   No orders of the defined types were placed in this encounter.   Referral Orders  No referral(s) requested today   Kathe Becton, MSN, ANE, FNP-BC [facility] [address] [place], [place] [address] [phone]- fax     Problem List Items Addressed This Visit   None   Visit Diagnoses    Acute low back pain with left-sided sciatica, unspecified back pain laterality    -  Primary   Relevant Medications   gabapentin (NEURONTIN) 100 MG capsule   cyclobenzaprine (FLEXERIL) 10 MG tablet   Muscle spasm of left lower extremity       Relevant Medications  gabapentin (NEURONTIN) 100 MG capsule   cyclobenzaprine (FLEXERIL) 10 MG tablet   Follow up          Meds ordered this encounter  Medications  . gabapentin (NEURONTIN) 100 MG capsule    Sig: Take 1 capsule (100 mg total) by mouth 2 (two) times daily.    Dispense:  60 capsule    Refill:  1  . cyclobenzaprine (FLEXERIL) 10 MG tablet    Sig: Take 1 tablet (10 mg total) by mouth 3 (three) times daily as needed for muscle  spasms.    Dispense:  60 tablet    Refill:  1    Follow-up: No follow-ups on file.    Azzie Glatter, FNP

## 2020-03-26 ENCOUNTER — Encounter: Payer: Self-pay | Admitting: Family Medicine

## 2020-03-26 ENCOUNTER — Other Ambulatory Visit: Payer: Self-pay | Admitting: Nurse Practitioner

## 2020-03-26 DIAGNOSIS — M5442 Lumbago with sciatica, left side: Secondary | ICD-10-CM

## 2020-03-26 NOTE — Progress Notes (Signed)
   [address] [place], [place]  [address] Phone:  [phone]   Fax:  [phone]  Patient requesting images of lumbar spine.

## 2020-03-28 ENCOUNTER — Other Ambulatory Visit: Payer: Self-pay

## 2020-03-28 ENCOUNTER — Ambulatory Visit (HOSPITAL_COMMUNITY)
Admission: RE | Admit: 2020-03-28 | Discharge: 2020-03-28 | Disposition: A | Discharge status: Home / Self Care | Payer: 59 | Source: Ambulatory Visit | Attending: Nurse Practitioner | Admitting: Nurse Practitioner

## 2020-03-28 DIAGNOSIS — M5442 Lumbago with sciatica, left side: Secondary | ICD-10-CM | POA: Diagnosis not present

## 2020-03-31 ENCOUNTER — Telehealth: Payer: Self-pay

## 2020-03-31 NOTE — Telephone Encounter (Signed)
I can place a referral but what is the reason; has she suffered an injury?  What extended note.?

## 2020-03-31 NOTE — Telephone Encounter (Signed)
Called pt to get more information , she was currently at an ortho appt., she said  disregard  previous message.

## 2020-03-31 NOTE — Telephone Encounter (Signed)
Please referral her to othro surgery. Also need an extended notes from last week.

## 2020-04-10 ENCOUNTER — Encounter (HOSPITAL_COMMUNITY): Payer: Self-pay | Admitting: Radiology

## 2020-04-10 ENCOUNTER — Emergency Department (HOSPITAL_COMMUNITY): Payer: 59

## 2020-04-10 ENCOUNTER — Emergency Department (HOSPITAL_COMMUNITY)
Admission: EM | Admit: 2020-04-10 | Discharge: 2020-04-10 | Disposition: A | Discharge status: Home / Self Care | Payer: 59 | Attending: Emergency Medicine | Admitting: Emergency Medicine

## 2020-04-10 DIAGNOSIS — E119 Type 2 diabetes mellitus without complications: Secondary | ICD-10-CM | POA: Diagnosis not present

## 2020-04-10 DIAGNOSIS — R1032 Left lower quadrant pain: Secondary | ICD-10-CM | POA: Diagnosis not present

## 2020-04-10 DIAGNOSIS — M25552 Pain in left hip: Secondary | ICD-10-CM | POA: Diagnosis not present

## 2020-04-10 DIAGNOSIS — Z79899 Other long term (current) drug therapy: Secondary | ICD-10-CM | POA: Diagnosis not present

## 2020-04-10 DIAGNOSIS — F17210 Nicotine dependence, cigarettes, uncomplicated: Secondary | ICD-10-CM | POA: Diagnosis not present

## 2020-04-10 DIAGNOSIS — M79605 Pain in left leg: Secondary | ICD-10-CM | POA: Diagnosis not present

## 2020-04-10 DIAGNOSIS — Z7982 Long term (current) use of aspirin: Secondary | ICD-10-CM | POA: Diagnosis not present

## 2020-04-10 DIAGNOSIS — I10 Essential (primary) hypertension: Secondary | ICD-10-CM | POA: Diagnosis not present

## 2020-04-10 DIAGNOSIS — M545 Low back pain, unspecified: Secondary | ICD-10-CM | POA: Diagnosis not present

## 2020-04-10 DIAGNOSIS — Z85850 Personal history of malignant neoplasm of thyroid: Secondary | ICD-10-CM | POA: Insufficient documentation

## 2020-04-10 DIAGNOSIS — M5442 Lumbago with sciatica, left side: Secondary | ICD-10-CM

## 2020-04-10 DIAGNOSIS — M62838 Other muscle spasm: Secondary | ICD-10-CM

## 2020-04-10 LAB — CBC WITH DIFFERENTIAL/PLATELET
Abs Immature Granulocytes: 0.03 10*3/uL (ref 0.00–0.07)
Basophils Absolute: 0 10*3/uL (ref 0.0–0.1)
Basophils Relative: 1 %
Eosinophils Absolute: 0.1 10*3/uL (ref 0.0–0.5)
Eosinophils Relative: 1 %
HCT: 43 % (ref 36.0–46.0)
Hemoglobin: 13.7 g/dL (ref 12.0–15.0)
Immature Granulocytes: 1 %
Lymphocytes Relative: 38 %
Lymphs Abs: 2.2 10*3/uL (ref 0.7–4.0)
MCH: 30.3 pg (ref 26.0–34.0)
MCHC: 31.9 g/dL (ref 30.0–36.0)
MCV: 95.1 fL (ref 80.0–100.0)
Monocytes Absolute: 0.4 10*3/uL (ref 0.1–1.0)
Monocytes Relative: 6 %
Neutro Abs: 3.1 10*3/uL (ref 1.7–7.7)
Neutrophils Relative %: 53 %
Platelets: 312 10*3/uL (ref 150–400)
RBC: 4.52 MIL/uL (ref 3.87–5.11)
RDW: 12.3 % (ref 11.5–15.5)
WBC: 5.8 10*3/uL (ref 4.0–10.5)
nRBC: 0 % (ref 0.0–0.2)

## 2020-04-10 LAB — BASIC METABOLIC PANEL
Anion gap: 6 (ref 5–15)
BUN: 14 mg/dL (ref 8–23)
CO2: 27 mmol/L (ref 22–32)
Calcium: 9.1 mg/dL (ref 8.9–10.3)
Chloride: 104 mmol/L (ref 98–111)
Creatinine, Ser: 0.93 mg/dL (ref 0.44–1.00)
GFR, Estimated: >60 mL/min (ref >60)
Glucose, Bld: 107 mg/dL — ABNORMAL HIGH (ref 70–99)
Potassium: 3.6 mmol/L (ref 3.5–5.1)
Sodium: 137 mmol/L (ref 135–145)

## 2020-04-10 MED ORDER — TRAMADOL HCL 50 MG PO TABS
50.0000 mg | ORAL_TABLET | Freq: Once | ORAL | Status: AC
Start: 2020-04-10 — End: 2020-04-10
  Administered 2020-04-10: 50 mg via ORAL
  Filled 2020-04-10: qty 1

## 2020-04-10 MED ORDER — CYCLOBENZAPRINE HCL 7.5 MG PO TABS: 7.5000 mg | ORAL_TABLET | Freq: Three times a day (TID) | ORAL | 0 refills | Status: AC | PRN

## 2020-04-10 MED ORDER — IOHEXOL 350 MG/ML SOLN
80.0000 mL | Freq: Once | INTRAVENOUS | Status: AC | PRN
  Administered 2020-04-10: 80 mL via INTRAVENOUS

## 2020-04-10 NOTE — ED Provider Notes (Signed)
[facility]PART[facility] Provider Note   CSN: [id number] Arrival date & time: 04/10/20  1040     History No chief complaint on file.   Cynthia Patton is a 60 y.o. female.  HPI   60 year old female with past medical history of HTN, HLD, DM presents to the emergency department with concern for left back/hip/leg pain.  Patient states a couple weeks ago on March 12 she was leaning over when she had a sudden popping sensation with pain radiating from her left hip/groin into her leg.  She states since then she has been having left groin and back pain that radiates down her left thigh.  She is been evaluated by her primary doctor, she had outpatient x-rays that were negative.  She has been referred to orthopedics who did an MRI.  This test was done yesterday.  They state that they will have the results on Monday.  She states that she cannot wait that long for results because of the pain.  She sometimes feels like the left lateral part of her leg is cold but denies any discoloration.  She has been ambulatory with a cane.  Denies any numbness.  She has not felt a bulge or abdominal swelling since this injury.  She states that her insurance company told her to come to the ER for MRI results and further care.  Past Medical History:  Diagnosis Date  . Anxiety   . Cancer ([place]) 2000?   thyroid cancer  . Depression   . Diabetes mellitus without complication ([place])   . Hyperlipidemia   . Hypertension   . Thyroid disease     Patient Active Problem List   Diagnosis Date Noted  . Thyroid cancer ([place]) 04/23/2019  . Hemoglobin A1C between 7% and 9% indicating borderline diabetic control 10/18/2018  . Positive ANA (antinuclear antibody) 04/06/2016  . Hyperlipidemia 03/19/2016  . Controlled type 2 diabetes mellitus with complication, without long-term current use of insulin ([place]) 02/09/2016  . Essential hypertension 02/09/2016  . Health care maintenance 02/24/2015    Past  Surgical History:  Procedure Laterality Date  . ABDOMINAL HYSTERECTOMY    . CHOLECYSTECTOMY    . INCISE AND DRAIN ABCESS     on right leg   . THYROIDECTOMY       OB History    Gravida  0   Para  0   Term  0   Preterm  0   AB  0   Living  0     SAB  0   IAB  0   Ectopic  0   Multiple  0   Live Births  0           Family History  Problem Relation Age of Onset  . Kidney disease Mother   . Hypertension Mother   . Hypertension Sister   . Heart disease Brother   . Colon cancer Neg Hx   . Colon polyps Neg Hx   . Esophageal cancer Neg Hx   . Rectal cancer Neg Hx   . Stomach cancer Neg Hx     Social History   Tobacco Use  . Smoking status: Current Every Day Smoker    Packs/day: 0.50    Types: Cigarettes  . Smokeless tobacco: Never Used  Vaping Use  . Vaping Use: Never used  Substance Use Topics  . Alcohol use: No  . Drug use: No    Home Medications Prior to Admission medications  Medication Sig Start Date End Date Taking? Authorizing Provider  albuterol (PROVENTIL) (2.5 MG/3ML) 0.083% nebulizer solution Take 3 mLs (2.5 mg total) by nebulization every 4 (four) hours as needed for wheezing or shortness of breath. 02/21/20 02/20/21  Vevelyn Francois, NP  aspirin EC 81 MG tablet Take 1 tablet (81 mg total) by mouth daily. 03/19/16   Dorena Dew, FNP  blood glucose meter kit and supplies KIT Dispense based on patient and insurance preference. Use up to four times daily as directed. (FOR ICD-9 250.00, 250.01). 07/07/18   Lanae Boast, FNP  citalopram (CELEXA) 10 MG tablet Take 1 tablet (10 mg total) by mouth daily. 09/13/19 09/12/20  Vevelyn Francois, NP  cyclobenzaprine (FLEXERIL) 10 MG tablet Take 1 tablet (10 mg total) by mouth 3 (three) times daily as needed for muscle spasms. 03/25/20   Azzie Glatter, FNP  dicyclomine (BENTYL) 10 MG capsule Take 1 capsule (10 mg total) by mouth 3 (three) times daily before meals. Dicyclomine TID AC x5 days, then TID PRN  abdominal cramping/spasms Patient not taking: Reported on 03/25/2020 10/09/19   Ladene Artist, MD  diltiazem (DILT-XR) 180 MG 24 hr capsule Take 1 capsule (180 mg total) by mouth daily. 11/01/19 10/31/20  Vevelyn Francois, NP  gabapentin (NEURONTIN) 100 MG capsule Take 1 capsule (100 mg total) by mouth 2 (two) times daily. 03/25/20   Azzie Glatter, FNP  ibuprofen (ADVIL) 800 MG tablet Take 1 tablet (800 mg total) by mouth every 8 (eight) hours as needed. Patient not taking: Reported on 03/25/2020 02/20/19   Azzie Glatter, FNP  levothyroxine (SYNTHROID) 175 MCG tablet Take by mouth. 01/14/20   [provider]  losartan (COZAAR) 50 MG tablet Take 50 mg by mouth daily. 07/09/19   [provider]  mometasone (NASONEX) 50 MCG/ACT nasal spray Place 2 sprays into the nose daily. Patient taking differently: Place 2 sprays into the nose daily as needed (nasal congestion). 10/14/16   Dorena Dew, FNP  ONETOUCH VERIO test strip USE UP TO 4 TIMES DAILY AS DIRECTED 10/22/18   [provider]  Semaglutide, 1 MG/DOSE, 2 MG/1.5ML SOPN Inject into the skin. 01/14/20   [provider]    Allergies    Floxin [ocuflox], Ofloxacin, and Percocet [oxycodone-acetaminophen]  Review of Systems   Review of Systems  Constitutional: Negative for chills and fever.  HENT: Negative for congestion.   Eyes: Negative for visual disturbance.  Respiratory: Negative for shortness of breath.   Cardiovascular: Negative for chest pain.  Gastrointestinal: Negative for abdominal pain, diarrhea and vomiting.  Genitourinary: Negative for dysuria.  Musculoskeletal:       Left lower back/hip/groin/thigh pain  Skin: Negative for rash.  Neurological: Negative for headaches.    Physical Exam Updated Vital Signs BP (!) 152/99 (BP Location: Right Arm)   Pulse 95   Temp 98.3 F (36.8 C) (Oral)   Resp 18   SpO2 100%   Physical Exam Vitals and nursing note reviewed.  Constitutional:       Appearance: Normal appearance.  HENT:     Head: Normocephalic.     Mouth/Throat:     Mouth: Mucous membranes are moist.  Cardiovascular:     Rate and Rhythm: Normal rate.  Pulmonary:     Effort: Pulmonary effort is normal. No respiratory distress.  Abdominal:     Palpations: Abdomen is soft.     Tenderness: There is no abdominal tenderness.  Musculoskeletal:  Comments: Very tender to palpation in any area along the left groin, no palpable swelling or hernia, does not tolerate deep palpation to feel for pulses, the left lower extremity appears equal to the right, equal palpable DP pulses, no discoloration, no change in tactile warmth.  Diffuse tenderness to palpation of the left buttocks and posterior hip area  Skin:    General: Skin is warm.  Neurological:     Mental Status: She is alert and oriented to person, place, and time. Mental status is at baseline.  Psychiatric:        Mood and Affect: Mood normal.     ED Results / Procedures / Treatments   Labs (all labs ordered are listed, but only abnormal results are displayed) Labs Reviewed  CBC WITH DIFFERENTIAL/PLATELET  BASIC METABOLIC PANEL    EKG None  Radiology No results found.  Procedures Procedures   Medications Ordered in ED Medications - No data to display  ED Course  I have reviewed the triage vital signs and the nursing notes.  Pertinent labs & imaging results that were available during my care of the patient were reviewed by me and considered in my medical decision making (see chart for details).    MDM Rules/Calculators/A&P                          60 year old female presents the emergency department with concern for ongoing left lower back/hip/groin/leg pain.  Patient states this all started on March 12.  She has been going through appropriate outpatient channels with her primary doctor and orthopedics and states that there has been no resolution and she cannot wait until Monday to have the MRI  results read by her orthopedic doctor.  She is hoping to have resolution today.  I discussed with the patient that we do not access to her MRI images or read.  I recommend she follows up at her outpatient appointment on Monday to have this read.  Patient is very displeased by this, she states that she cannot wait until Monday for resolution of this ongoing pain. I offered a PE which was initially refused. She then asked that we repeat an MRI today, I told her that I did not feel that this was clinically necessary or emergent enough to warrant a repeat of this exam. Patient did not feel this was appropriate, when I tried to explain she then felt like I was getting defensive and requested to speak to supervisor.  I brought this to the attention of the charge nurse Carlis Abbott R.  He went in and spoke with the patient who then agreed to let me perform a physical exam.  Charge nurse present as chaperone for the physical exam and conversation.  The left lower extremity appears to be neurovascularly intact.  She has tenderness to palpation in the left inguinal area with no palpable hernia/mass, does not tolerate deep palpation to evaluate for pulses.  She also has tenderness to palpation in the left buttocks and posterior hip. I believe that this is most likely musculoskeletal but multiple times the patient has mentioned concern for a blood clot/blood flow problem in that area.  We have agree on a CT angio of the pelvis.  CT angio was unremarkable, shows no acute pathology.  On reevaluation patient continues to be uncomfortable.  I have encouraged her to follow-up at her appointment on Monday with the orthopedic doctor for MRI results reviewed.  Patient will be discharged  and treated as an outpatient.  Discharge plan and strict return to ED precautions discussed, patient verbalizes understanding and agreement.   Final Clinical Impression(s) / ED Diagnoses Final diagnoses:  None    Rx / DC Orders ED Discharge  Orders    None       Lorelle Gibbs, DO 04/10/20 1446

## 2020-04-10 NOTE — ED Notes (Signed)
Pt transported to ct scan

## 2020-04-10 NOTE — ED Notes (Signed)
Pt called out, readjusted in bed and requesting food.

## 2020-04-10 NOTE — ED Triage Notes (Signed)
Pt reports ongoing left lower back/leg pain since March 12th. Pt reports seen been PCP diagnosed with sciatica. Pt reports also followed up with ortho had an MRI done yesterday. Wants results and pain control.

## 2020-04-10 NOTE — Discharge Instructions (Addendum)
You have been seen and discharged from the emergency department. You were prescribed a muscle relaxer.  Do not take the muscle relaxer and tramadol at the same time, this may be sedating.  Do not mix either of these medications with alcohol.  Continue to take your tramadol as prescribed and as needed.  Follow-up with your orthopedic appointment on Monday for MRI results.  Follow-up with your primary provider for reevaluation and further care. Take home medications as prescribed. If you have any worsening symptoms or further concerns for health please return to an emergency department for further evaluation.

## 2020-04-11 ENCOUNTER — Telehealth: Payer: Self-pay

## 2020-04-11 NOTE — Telephone Encounter (Signed)
FMLA filled out? Please let pt know.

## 2020-04-15 ENCOUNTER — Encounter: Payer: Self-pay | Admitting: Family Medicine

## 2020-04-15 ENCOUNTER — Telehealth: Payer: Self-pay

## 2020-04-15 NOTE — Telephone Encounter (Signed)
Pt is needed the completed forms for her job. She needs this immediately.

## 2020-04-21 ENCOUNTER — Other Ambulatory Visit: Payer: Self-pay | Admitting: Family Medicine

## 2020-04-21 DIAGNOSIS — M545 Low back pain, unspecified: Secondary | ICD-10-CM

## 2020-04-28 ENCOUNTER — Ambulatory Visit
Admission: RE | Admit: 2020-04-28 | Discharge: 2020-04-28 | Disposition: A | Discharge status: Home / Self Care | Payer: 59 | Source: Ambulatory Visit | Attending: Family Medicine | Admitting: Family Medicine

## 2020-04-28 ENCOUNTER — Other Ambulatory Visit: Payer: Self-pay

## 2020-04-28 DIAGNOSIS — M545 Low back pain, unspecified: Secondary | ICD-10-CM

## 2020-05-01 ENCOUNTER — Other Ambulatory Visit: Payer: Self-pay | Admitting: Family Medicine

## 2020-05-01 DIAGNOSIS — M79604 Pain in right leg: Secondary | ICD-10-CM

## 2020-05-12 ENCOUNTER — Telehealth: Payer: Self-pay

## 2020-05-12 NOTE — Telephone Encounter (Signed)
This was done from any provide she will need contact their office

## 2020-05-12 NOTE — Telephone Encounter (Signed)
Pt ask to speak with Cynthia Patton about mri  results/ other medical stuff

## 2020-06-24 ENCOUNTER — Telehealth: Payer: Self-pay | Admitting: Nurse Practitioner

## 2020-06-24 ENCOUNTER — Ambulatory Visit (INDEPENDENT_AMBULATORY_CARE_PROVIDER_SITE_OTHER): Payer: Worker's Compensation | Admitting: Family Medicine

## 2020-06-24 ENCOUNTER — Other Ambulatory Visit: Payer: Self-pay | Admitting: Family Medicine

## 2020-06-24 VITALS — BP 158/63 | HR 85 | Temp 97.7°F | Ht 64.0 in | Wt 204.0 lb

## 2020-06-24 DIAGNOSIS — T3695XA Adverse effect of unspecified systemic antibiotic, initial encounter: Secondary | ICD-10-CM

## 2020-06-24 DIAGNOSIS — N611 Abscess of the breast and nipple: Secondary | ICD-10-CM

## 2020-06-24 DIAGNOSIS — B379 Candidiasis, unspecified: Secondary | ICD-10-CM

## 2020-06-24 MED ORDER — DOXYCYCLINE HYCLATE 100 MG PO TABS
100.0000 mg | ORAL_TABLET | Freq: Two times a day (BID) | ORAL | 0 refills | Status: AC
Start: 2020-06-24 — End: 2020-07-04

## 2020-06-24 MED ORDER — FLUCONAZOLE 150 MG PO TABS
150.0000 mg | ORAL_TABLET | Freq: Once | ORAL | 0 refills | Status: AC
Start: 2020-06-24 — End: 2020-06-24

## 2020-06-24 NOTE — Progress Notes (Signed)
Established Patient Office Visit  Subjective:  Patient ID: Cynthia Patton, female    DOB: 1960/12/26  Age: 60 y.o. MRN: [id number]  CC:  Chief Complaint  Patient presents with   Acute Visit    Cyst on left breast noticed Friday, draining bursted last night. Painful     HPI Cynthia Patton very pleasant 60 year old female with a medical history significant for type 2 diabetes mellitus, history of thyroid cancer in remission, tobacco dependence, hyperlipidemia, and hypertension that presents with a painful abscess to left breast that was first noticed 4 days prior.  Patient states that she noticed a sore area to left breast that continue to swell and became progressively more painful.  She says that this morning upon awakening she had a great deal of drainage.  She characterizes abscess is very tender to touch, draining, and red.  Pain intensity is 6/10 characterized as soreness.  Endorses some fever several days ago.  She denies any headache, fatigue, chest pain, urinary symptoms, nausea, vomiting, or diarrhea.  Past Medical History:  Diagnosis Date   Acute low back pain 03/25/2020   Anxiety    Cancer ([place]) 2000?   thyroid cancer   Depression    Diabetes mellitus without complication ([place])    Hyperlipidemia    Hypertension    Thyroid disease     Past Surgical History:  Procedure Laterality Date   ABDOMINAL HYSTERECTOMY     CHOLECYSTECTOMY     INCISE AND DRAIN ABCESS     on right leg    THYROIDECTOMY      Family History  Problem Relation Age of Onset   Kidney disease Mother    Hypertension Mother    Hypertension Sister    Heart disease Brother    Colon cancer Neg Hx    Colon polyps Neg Hx    Esophageal cancer Neg Hx    Rectal cancer Neg Hx    Stomach cancer Neg Hx     Social History   Socioeconomic History   Marital status: Single    Spouse name: Not on file   Number of children: Not on file   Years of education: Not on file   Highest education level: Not on  file  Occupational History   Not on file  Tobacco Use   Smoking status: Every Day    Packs/day: 0.50    Pack years: 0.00    Types: Cigarettes   Smokeless tobacco: Never  Vaping Use   Vaping Use: Never used  Substance and Sexual Activity   Alcohol use: No   Drug use: No   Sexual activity: Not Currently  Other Topics Concern   Not on file  Social History Narrative   Not on file   Social Determinants of Health   Financial Resource Strain: Not on file  Food Insecurity: Not on file  Transportation Needs: Not on file  Physical Activity: Not on file  Stress: Not on file  Social Connections: Not on file  Intimate Partner Violence: Not on file    Outpatient Medications Prior to Visit  Medication Sig Dispense Refill   albuterol (PROVENTIL) (2.5 MG/3ML) 0.083% nebulizer solution Take 3 mLs (2.5 mg total) by nebulization every 4 (four) hours as needed for wheezing or shortness of breath. 75 mL 2   blood glucose meter kit and supplies KIT Dispense based on patient and insurance preference. Use up to four times daily as directed. (FOR ICD-9 250.00, 250.01). 1 each 0   citalopram (  CELEXA) 10 MG tablet Take 1 tablet (10 mg total) by mouth daily. 30 tablet 11   dicyclomine (BENTYL) 10 MG capsule Take 1 capsule (10 mg total) by mouth 3 (three) times daily before meals. Dicyclomine TID AC x5 days, then TID PRN abdominal cramping/spasms 30 capsule 0   diltiazem (DILT-XR) 180 MG 24 hr capsule Take 1 capsule (180 mg total) by mouth daily. 90 capsule 3   gabapentin (NEURONTIN) 100 MG capsule Take 1 capsule (100 mg total) by mouth 2 (two) times daily. 60 capsule 1   ibuprofen (ADVIL) 800 MG tablet TAKE 1 TABLET BY MOUTH EVERY 8 HOURS AS NEEDED 30 tablet 0   levothyroxine (SYNTHROID) 175 MCG tablet Take by mouth.     losartan (COZAAR) 50 MG tablet Take 50 mg by mouth daily.     mometasone (NASONEX) 50 MCG/ACT nasal spray Place 2 sprays into the nose daily. (Patient taking differently: Place 2 sprays  into the nose daily as needed (nasal congestion).) 17 g 1   ONETOUCH VERIO test strip USE UP TO 4 TIMES DAILY AS DIRECTED     Semaglutide, 1 MG/DOSE, 2 MG/1.5ML SOPN Inject into the skin.     aspirin EC 81 MG tablet Take 1 tablet (81 mg total) by mouth daily. 30 tablet 11   No facility-administered medications prior to visit.    Allergies  Allergen Reactions   Floxin [Ocuflox] Other (See Comments)    Disoriented   Ofloxacin Other (See Comments)    Disoriented   Percocet [Oxycodone-Acetaminophen]     ROS Review of Systems  Constitutional:  Positive for fever. Negative for fatigue.  HENT: Negative.       Objective:    Physical Exam Constitutional:      General: She is not in acute distress.    Appearance: She is obese. She is not ill-appearing or toxic-appearing.  Eyes:     Pupils: Pupils are equal, round, and reactive to light.  Cardiovascular:     Rate and Rhythm: Normal rate and regular rhythm.  Pulmonary:     Effort: Pulmonary effort is normal.  Abdominal:     General: Bowel sounds are normal.  Skin:         Comments: Left breast abscess, 0.5 cm, indurated, hyperpigmentation, moderate purulent drainage, very tender to palpation.  Neurological:     General: No focal deficit present.     Mental Status: She is alert and oriented to person, place, and time. Mental status is at baseline.  Psychiatric:        Mood and Affect: Mood normal.        Thought Content: Thought content normal.    BP (!) 158/63 (BP Location: Right Arm, Patient Position: Sitting)   Pulse 85   Temp 97.7 F (36.5 C)   Ht '5\' 4"'  (1.626 m)   Wt 204 lb 0.2 oz (92.5 kg)   SpO2 98%   BMI 35.02 kg/m  Wt Readings from Last 3 Encounters:  06/24/20 204 lb 0.2 oz (92.5 kg)  03/25/20 209 lb (94.8 kg)  02/21/20 207 lb (93.9 kg)     Health Maintenance Due  Topic Date Due   Pneumococcal Vaccine 17-27 Years old (1 - PCV) Never done   Zoster Vaccines- Shingrix (1 of 2) Never done   PAP  SMEAR-Modifier  Never done   COVID-19 Vaccine (4 - Booster for Pfizer series) 04/25/2020    There are no preventive care reminders to display for this patient.  Lab Results  Component Value Date   TSH 14.280 (H) 07/13/2018   Lab Results  Component Value Date   WBC 5.8 04/10/2020   HGB 13.7 04/10/2020   HCT 43.0 04/10/2020   MCV 95.1 04/10/2020   PLT 312 04/10/2020   Lab Results  Component Value Date   NA 137 04/10/2020   K 3.6 04/10/2020   CO2 27 04/10/2020   GLUCOSE 107 (H) 04/10/2020   BUN 14 04/10/2020   CREATININE 0.93 04/10/2020   BILITOT 0.4 07/27/2019   ALKPHOS 94 07/27/2019   AST 10 07/27/2019   ALT 13 02/12/2019   PROT 7.2 07/27/2019   ALBUMIN 4.3 07/27/2019   CALCIUM 9.1 04/10/2020   ANIONGAP 6 04/10/2020   Lab Results  Component Value Date   CHOL 162 02/08/2020   Lab Results  Component Value Date   HDL 44 02/08/2020   Lab Results  Component Value Date   LDLCALC 100 (H) 02/08/2020   Lab Results  Component Value Date   TRIG 97 02/08/2020   Lab Results  Component Value Date   CHOLHDL 3.7 02/08/2020   Lab Results  Component Value Date   HGBA1C 6.2 (A) 02/08/2020   HGBA1C 6.2 02/08/2020   HGBA1C 6.2 02/08/2020   HGBA1C 6.2 02/08/2020      Assessment & Plan:   Problem List Items Addressed This Visit   None Visit Diagnoses     Left breast abscess    -  Primary   Relevant Medications   doxycycline (VIBRA-TABS) 100 MG tablet   Other Relevant Orders   Culture, routine-abscess   MM Digital Screening   CBC with Differential   Antibiotic-induced yeast infection       Relevant Medications   fluconazole (DIFLUCAN) 150 MG tablet       Meds ordered this encounter  Medications   doxycycline (VIBRA-TABS) 100 MG tablet    Sig: Take 1 tablet (100 mg total) by mouth 2 (two) times daily for 10 days.    Dispense:  20 tablet    Refill:  0    Order Specific Question:   Supervising Provider    Answer:   Tresa Garter [[id number]]    fluconazole (DIFLUCAN) 150 MG tablet    Sig: Take 1 tablet (150 mg total) by mouth once for 1 dose.    Dispense:  1 tablet    Refill:  0    Order Specific Question:   Supervising Provider    Answer:   Tresa Garter [id number]   Assessment and plan: Ms. Spillers is found to have an abscess at the 11 o'clock position of left breast with moderate drainage, purulent, very tender to palpation with some induration.  We will start a trial of doxycycline 100 mg twice daily for total of 10 days.  Since abscess is draining, no lancing was warranted on today.  Patient advised to leave area open to air, however cover with gauze when wearing a brassiere. Follow-up in clinic if symptoms worsen.  Will review CBC with differential as results become available.  Also, left breast abscess was cultured, will follow culture.  As of right now, we are treating empirically.  Will change antibiotic according to culture results. Patient typically gets antibiotic induced yeast infections, Diflucan was sent to patient's pharmacy.  If symptoms of vaginal yeast infection develop, take Diflucan 150 mg x 1.    Follow-up: Return in about 3 months (around 09/24/2020).    Cammie Sickle, [id number]

## 2020-06-24 NOTE — Telephone Encounter (Signed)
Patient is requesting referral to a nephrologist as a result of findings on recent MRI for back injury. Please call patient with guidance on next steps for the kidney referral.

## 2020-06-24 NOTE — Patient Instructions (Signed)
Skin Abscess  A skin abscess is an infected area on or under your skin that contains a collection of pus and other material. An abscess may also be called a furuncle,carbuncle, or boil. An abscess can occur in or on almost any part of your body. Some abscesses break open (rupture) on their own. Most continue to get worse unless they are treated. The infection can spread deeper into the body and eventually into your blood, whichcan make you feel ill. Treatment usually involves draining the abscess. What are the causes? An abscess occurs when germs, like bacteria, pass through your skin and cause an infection. This may be caused by: A scrape or cut on your skin. A puncture wound through your skin, including a needle injection or insect bite. Blocked oil or sweat glands. Blocked and infected hair follicles. A cyst that forms beneath your skin (sebaceous cyst) and becomes infected. What increases the risk? This condition is more likely to develop in people who: Have a weak body defense system (immune system). Have diabetes. Have dry and irritated skin. Get frequent injections or use illegal IV drugs. Have a foreign body in a wound, such as a splinter. Have problems with their lymph system or veins. What are the signs or symptoms? Symptoms of this condition include: A painful, firm bump under the skin. A bump with pus at the top. This may break through the skin and drain. Other symptoms include: Redness surrounding the abscess site. Warmth. Swelling of the lymph nodes (glands) near the abscess. Tenderness. A sore on the skin. How is this diagnosed? This condition may be diagnosed based on: A physical exam. Your medical history. A sample of pus. This may be used to find out what is causing the infection. Blood tests. Imaging tests, such as an ultrasound, CT scan, or MRI. How is this treated? A small abscess that drains on its own may not need treatment. Treatment for larger abscesses  may include: Moist heat or heat pack applied to the area several times a day. A procedure to drain the abscess (incision and drainage). Antibiotic medicines. For a severe abscess, you may first get antibiotics through an IV and then change to antibiotics by mouth. Follow these instructions at home: Medicines  Take over-the-counter and prescription medicines only as told by your health care provider. If you were prescribed an antibiotic medicine, take it as told by your health care provider. Do not stop taking the antibiotic even if you start to feel better.  Abscess care  If you have an abscess that has not drained, apply heat to the affected area. Use the heat source that your health care provider recommends, such as a moist heat pack or a heating pad. Place a towel between your skin and the heat source. Leave the heat on for 20-30 minutes. Remove the heat if your skin turns bright red. This is especially important if you are unable to feel pain, heat, or cold. You may have a greater risk of getting burned. Follow instructions from your health care provider about how to take care of your abscess. Make sure you: Cover the abscess with a bandage (dressing). Change your dressing or gauze as told by your health care provider. Wash your hands with soap and water before you change the dressing or gauze. If soap and water are not available, use hand sanitizer. Check your abscess every day for signs of a worsening infection. Check for: More redness, swelling, or pain. More fluid or blood. Warmth. More   pus or a bad smell.  General instructions To avoid spreading the infection: Do not share personal care items, towels, or hot tubs with others. Avoid making skin contact with other people. Keep all follow-up visits as told by your health care provider. This is important. Contact a health care provider if you have: More redness, swelling, or pain around your abscess. More fluid or blood coming  from your abscess. Warm skin around your abscess. More pus or a bad smell coming from your abscess. A fever. Muscle aches. Chills or a general ill feeling. Get help right away if you: Have severe pain. See red streaks on your skin spreading away from the abscess. Summary A skin abscess is an infected area on or under your skin that contains a collection of pus and other material. A small abscess that drains on its own may not need treatment. Treatment for larger abscesses may include having a procedure to drain the abscess and taking an antibiotic. This information is not intended to replace advice given to you by your health care provider. Make sure you discuss any questions you have with your healthcare provider. Document Revised: 04/20/2018 Document Reviewed: 02/10/2017 Elsevier Patient Education  2022 [employer] Inc.  

## 2020-06-25 ENCOUNTER — Other Ambulatory Visit: Payer: Self-pay | Admitting: Nurse Practitioner

## 2020-06-25 ENCOUNTER — Encounter: Payer: Self-pay | Admitting: Family Medicine

## 2020-06-25 ENCOUNTER — Telehealth: Payer: Self-pay | Admitting: Family Medicine

## 2020-06-25 DIAGNOSIS — Q6102 Congenital multiple renal cysts: Secondary | ICD-10-CM

## 2020-06-25 LAB — CBC WITH DIFFERENTIAL/PLATELET
Basophils Absolute: 0 10*3/uL (ref 0.0–0.2)
Basos: 0 %
EOS (ABSOLUTE): 0.1 10*3/uL (ref 0.0–0.4)
Eos: 1 %
Hematocrit: 38.5 % (ref 34.0–46.6)
Hemoglobin: 12.6 g/dL (ref 11.1–15.9)
Immature Grans (Abs): 0 10*3/uL (ref 0.0–0.1)
Immature Granulocytes: 0 %
Lymphocytes Absolute: 2.4 10*3/uL (ref 0.7–3.1)
Lymphs: 24 %
MCH: 30.1 pg (ref 26.6–33.0)
MCHC: 32.7 g/dL (ref 31.5–35.7)
MCV: 92 fL (ref 79–97)
Monocytes Absolute: 0.5 10*3/uL (ref 0.1–0.9)
Monocytes: 5 %
Neutrophils Absolute: 6.8 10*3/uL (ref 1.4–7.0)
Neutrophils: 70 %
Platelets: 363 10*3/uL (ref 150–450)
RBC: 4.19 x10E6/uL (ref 3.77–5.28)
RDW: 12.5 % (ref 11.7–15.4)
WBC: 9.8 10*3/uL (ref 3.4–10.8)

## 2020-06-25 NOTE — Telephone Encounter (Signed)
Cynthia Patton is a 60 year old female that presented on 06/24/2020 with a painful abscess to left breast.  A trial of doxycycline 100 mg/day was started.  Please remind patient of the importance of taking this medication consistently in order to achieve positive outcomes.  Ensure that left breast abscess is starting to improve.  Reviewed patient CBC with differential, it was all within normal range.  If there are signs of vaginal yeast infection, Diflucan 150 mg x 1 was also sent to patient's pharmacy.  Remind patient to schedule follow-up with her PCP.  Also, an order was sent to [facility] imaging breast center and she can go ahead and schedule her mammogram.   Donia Pounds  APRN, MSN, FNP-C [facility] [address] [place], [place] [address] [phone]

## 2020-06-26 LAB — WOUND CULTURE

## 2020-06-27 LAB — WOUND CULTURE

## 2020-06-27 NOTE — Telephone Encounter (Signed)
Patient notified of message below. Verbally understood, no additional questions or concerns.

## 2020-07-28 DIAGNOSIS — E890 Postprocedural hypothyroidism: Secondary | ICD-10-CM | POA: Insufficient documentation

## 2020-08-01 ENCOUNTER — Ambulatory Visit
Admission: RE | Admit: 2020-08-01 | Discharge: 2020-08-01 | Disposition: A | Discharge status: Home / Self Care | Payer: Self-pay | Source: Ambulatory Visit | Attending: Family Medicine | Admitting: Family Medicine

## 2020-08-01 ENCOUNTER — Other Ambulatory Visit: Payer: Self-pay | Admitting: Family Medicine

## 2020-08-01 ENCOUNTER — Other Ambulatory Visit: Payer: Self-pay

## 2020-08-01 DIAGNOSIS — Z1231 Encounter for screening mammogram for malignant neoplasm of breast: Secondary | ICD-10-CM

## 2020-08-01 DIAGNOSIS — N611 Abscess of the breast and nipple: Secondary | ICD-10-CM

## 2020-08-07 ENCOUNTER — Ambulatory Visit (INDEPENDENT_AMBULATORY_CARE_PROVIDER_SITE_OTHER): Payer: Self-pay | Admitting: Nurse Practitioner

## 2020-08-07 ENCOUNTER — Encounter: Payer: Self-pay | Admitting: Nurse Practitioner

## 2020-08-07 ENCOUNTER — Other Ambulatory Visit: Payer: Self-pay

## 2020-08-07 VITALS — BP 168/60 | HR 83 | Temp 97.8°F | Ht 64.0 in | Wt 203.0 lb

## 2020-08-07 DIAGNOSIS — C73 Malignant neoplasm of thyroid gland: Secondary | ICD-10-CM

## 2020-08-07 DIAGNOSIS — F331 Major depressive disorder, recurrent, moderate: Secondary | ICD-10-CM

## 2020-08-07 DIAGNOSIS — I10 Essential (primary) hypertension: Secondary | ICD-10-CM

## 2020-08-07 DIAGNOSIS — E118 Type 2 diabetes mellitus with unspecified complications: Secondary | ICD-10-CM

## 2020-08-07 DIAGNOSIS — E039 Hypothyroidism, unspecified: Secondary | ICD-10-CM

## 2020-08-07 LAB — POCT URINALYSIS DIPSTICK
Bilirubin, UA: NEGATIVE
Glucose, UA: NEGATIVE
Ketones, UA: NEGATIVE
Leukocytes, UA: NEGATIVE
Nitrite, UA: NEGATIVE
Protein, UA: NEGATIVE
Spec Grav, UA: 1.02 (ref 1.010–1.025)
Urobilinogen, UA: 0.2 E.U./dL
pH: 5.5

## 2020-08-07 NOTE — Progress Notes (Signed)
[address], [place]  [address] Phone:  [phone]   Fax:  [phone]   Established Patient Office Visit  Subjective:  Patient ID: Cynthia Patton, female    DOB: Mar 18, 1960  Age: 60 y.o. MRN: [id number]  CC:  Chief Complaint  Patient presents with   Follow-up    No questions or concerns.     HPI Cynthia Patton presents for follow up. She  has a past medical history of Acute low back pain (03/25/2020), Anxiety, Cancer ([place]) (2000?), Depression, Diabetes mellitus without complication ([place]), Hyperlipidemia, Hypertension, and Thyroid disease.   Hypertension Patient is here for follow-up of elevated blood pressure. She is not exercising and is adherent to a low-salt diet.  Cardiac symptoms: none. Patient denies chest pain, fatigue, lower extremity edema, and palpitations. Cardiovascular risk factors: diabetes mellitus, dyslipidemia, hypertension, obesity (BMI >= 30 kg/m2), sedentary lifestyle, and smoking/ tobacco exposure. Use of agents associated with hypertension: thyroid hormones. History of target organ damage: none.  She has not been seen by nephrology for the cyst . She is going to see Urology on August 14, 2020.   She continues to suffer with depression. The Citalopram 10 mg is effective. She does need her FMLA updated. She is requesting additional time. She had to move from her apt. She is currently living with her elderly aunt.  Past Medical History:  Diagnosis Date   Acute low back pain 03/25/2020   Anxiety    Cancer (Cynthia Patton) 2000?   thyroid cancer   Depression    Diabetes mellitus without complication (Cynthia Patton)    Hyperlipidemia    Hypertension    Thyroid disease     Past Surgical History:  Procedure Laterality Date   ABDOMINAL HYSTERECTOMY     BREAST CYST EXCISION Left    CHOLECYSTECTOMY     INCISE AND DRAIN ABCESS     on right leg    THYROIDECTOMY      Family History  Problem Relation Age of Onset   Kidney disease  Mother    Hypertension Mother    Hypertension Sister    Heart disease Brother    Colon cancer Neg Hx    Colon polyps Neg Hx    Esophageal cancer Neg Hx    Rectal cancer Neg Hx    Stomach cancer Neg Hx     Social History   Socioeconomic History   Marital status: Single    Spouse name: Not on file   Number of children: Not on file   Years of education: Not on file   Highest education level: Not on file  Occupational History   Not on file  Tobacco Use   Smoking status: Every Day    Packs/day: 0.50    Types: Cigarettes   Smokeless tobacco: Never  Vaping Use   Vaping Use: Never used  Substance and Sexual Activity   Alcohol use: No   Drug use: No   Sexual activity: Not Currently  Other Topics Concern   Not on file  Social History Narrative   Not on file   Social Determinants of Health   Financial Resource Strain: Not on file  Food Insecurity: Not on file  Transportation Needs: Not on file  Physical Activity: Not on file  Stress: Not on file  Social Connections: Not on file  Intimate Partner Violence: Not on file    Outpatient Medications Prior to Visit  Medication Sig Dispense Refill   albuterol (PROVENTIL) (  2.5 MG/3ML) 0.083% nebulizer solution Take 3 mLs (2.5 mg total) by nebulization every 4 (four) hours as needed for wheezing or shortness of breath. 75 mL 2   blood glucose meter kit and supplies KIT Dispense based on patient and insurance preference. Use up to four times daily as directed. (FOR ICD-9 250.00, 250.01). 1 each 0   citalopram (CELEXA) 10 MG tablet Take 1 tablet (10 mg total) by mouth daily. 30 tablet 11   dicyclomine (BENTYL) 10 MG capsule Take 1 capsule (10 mg total) by mouth 3 (three) times daily before meals. Dicyclomine TID AC x5 days, then TID PRN abdominal cramping/spasms 30 capsule 0   diltiazem (DILT-XR) 180 MG 24 hr capsule Take 1 capsule (180 mg total) by mouth daily. 90 capsule 3   gabapentin (NEURONTIN) 100 MG capsule Take 1 capsule (100  mg total) by mouth 2 (two) times daily. 60 capsule 1   ibuprofen (ADVIL) 800 MG tablet TAKE 1 TABLET BY MOUTH EVERY 8 HOURS AS NEEDED 30 tablet 0   levothyroxine (SYNTHROID) 175 MCG tablet Take by mouth.     losartan (COZAAR) 50 MG tablet Take 50 mg by mouth daily.     mometasone (NASONEX) 50 MCG/ACT nasal spray Place 2 sprays into the nose daily. (Patient taking differently: Place 2 sprays into the nose daily as needed (nasal congestion).) 17 g 1   ONETOUCH VERIO test strip USE UP TO 4 TIMES DAILY AS DIRECTED     Semaglutide, 1 MG/DOSE, 2 MG/1.5ML SOPN Inject into the skin.     No facility-administered medications prior to visit.    Allergies  Allergen Reactions   Floxin [Ocuflox] Other (See Comments)    Disoriented   Ofloxacin Other (See Comments)    Disoriented   Percocet [Oxycodone-Acetaminophen]     ROS Review of Systems    Objective:    Physical Exam Constitutional:      Appearance: She is obese.  HENT:     Head: Normocephalic and atraumatic.  Cardiovascular:     Rate and Rhythm: Normal rate and regular rhythm.     Pulses: Normal pulses.     Heart sounds: Normal heart sounds.  Pulmonary:     Effort: Pulmonary effort is normal.     Breath sounds: Normal breath sounds.     Comments: Diminished expiratory BS  Abdominal:     Palpations: Abdomen is soft.     Comments: hypoactive  Musculoskeletal:        General: Normal range of motion.     Cervical back: Normal range of motion.  Skin:    General: Skin is warm and dry.     Capillary Refill: Capillary refill takes less than 2 seconds.  Neurological:     General: No focal deficit present.     Mental Status: She is alert and oriented to person, place, and time.  Psychiatric:        Mood and Affect: Mood normal.        Thought Content: Thought content normal.        Judgment: Judgment normal.    BP (!) 168/60 (BP Location: Right Arm, Patient Position: Sitting)   Pulse 83   Temp 97.8 F (36.6 C)   Ht '5\' 4"'   (1.626 m)   Wt 203 lb (92.1 kg)   SpO2 96%   BMI 34.84 kg/m  Wt Readings from Last 3 Encounters:  08/07/20 203 lb (92.1 kg)  06/24/20 204 lb 0.2 oz (92.5 kg)  03/25/20 209 lb (  94.8 kg)     Health Maintenance Due  Topic Date Due   Zoster Vaccines- Shingrix (1 of 2) Never done   PAP SMEAR-Modifier  Never done   Pneumococcal Vaccine 86-49 Years old (2 - PCV) 03/19/2017   COVID-19 Vaccine (4 - Booster for Pfizer series) 04/25/2020   OPHTHALMOLOGY EXAM  07/11/2020   FOOT EXAM  07/26/2020   HEMOGLOBIN A1C  08/07/2020    There are no preventive care reminders to display for this patient.  Lab Results  Component Value Date   TSH 14.280 (H) 07/13/2018   Lab Results  Component Value Date   WBC 9.8 06/24/2020   HGB 12.6 06/24/2020   HCT 38.5 06/24/2020   MCV 92 06/24/2020   PLT 363 06/24/2020   Lab Results  Component Value Date   NA 137 04/10/2020   K 3.6 04/10/2020   CO2 27 04/10/2020   GLUCOSE 107 (H) 04/10/2020   BUN 14 04/10/2020   CREATININE 0.93 04/10/2020   BILITOT 0.4 07/27/2019   ALKPHOS 94 07/27/2019   AST 10 07/27/2019   ALT 13 02/12/2019   PROT 7.2 07/27/2019   ALBUMIN 4.3 07/27/2019   CALCIUM 9.1 04/10/2020   ANIONGAP 6 04/10/2020   Lab Results  Component Value Date   CHOL 162 02/08/2020   Lab Results  Component Value Date   HDL 44 02/08/2020   Lab Results  Component Value Date   LDLCALC 100 (H) 02/08/2020   Lab Results  Component Value Date   TRIG 97 02/08/2020   Lab Results  Component Value Date   CHOLHDL 3.7 02/08/2020   Lab Results  Component Value Date   HGBA1C 6.2 (A) 02/08/2020   HGBA1C 6.2 02/08/2020   HGBA1C 6.2 02/08/2020   HGBA1C 6.2 02/08/2020      Assessment & Plan:   Problem List Items Addressed This Visit       Cardiovascular and Mediastinum   Essential hypertension - Primary Persistent Encouraged on going compliance with current medication regimen Encouraged home monitoring and recording BP <130/80 Eating a  heart-healthy diet with less salt Encouraged regular physical activity  Recommend Weight loss       Endocrine   Controlled type 2 diabetes mellitus with complication, without long-term current use of insulin (HCC) Stable Continue with current regimen.  No changes warranted. Good patient compliance. Followed by endo     Relevant Orders   Urinalysis Dipstick (Completed)   Other Visit Diagnoses     Acquired hypothyroidism    Stable Followed by endo     Moderate episode of recurrent major depressive disorder ([place])     Persistent  Discussed counseling currently with job Discussed exercise in particular may have an especially positive effect on depression. three to five exercise sessions per week, that last 45 to 60 minutes per session, for at least 10 weeks, and that involve aerobic exercise (such as walking, running, or cycling) or resistance training (upper and lower body weight lifting).        No orders of the defined types were placed in this encounter.   Follow-up: Return in about 3 months (around 11/07/2020).    Vevelyn Francois, NP

## 2020-08-07 NOTE — Patient Instructions (Addendum)
Managing Your Hypertension Hypertension, also called high blood pressure, is when the force of the blood pressing against the walls of the arteries is too strong. Arteries are blood vessels that carry blood from your heart throughout your body. Hypertension forces the heart to work harder to pump blood and may cause the arteries tobecome narrow or stiff. Understanding blood pressure readings Your personal target blood pressure may vary depending on your medical conditions, your age, and other factors. A blood pressure reading includes a higher number over a lower number. Ideally, your blood pressure should be below 120/80. You should know that: The first, or top, number is called the systolic pressure. It is a measure of the pressure in your arteries as your heart beats. The second, or bottom number, is called the diastolic pressure. It is a measure of the pressure in your arteries as the heart relaxes. Blood pressure is classified into four stages. Based on your blood pressure reading, your health care provider may use the following stages to determine what type of treatment you need, if any. Systolic pressure and diastolicpressure are measured in a unit called mmHg. Normal Systolic pressure: below 120. Diastolic pressure: below 80. Elevated Systolic pressure: 120-129. Diastolic pressure: below 80. Hypertension stage 1 Systolic pressure: 130-139. Diastolic pressure: 80-89. Hypertension stage 2 Systolic pressure: 140 or above. Diastolic pressure: 90 or above. How can this condition affect me? Managing your hypertension is an important responsibility. Over time, hypertension can damage the arteries and decrease blood flow to important parts of the body, including the brain, heart, and kidneys. Having untreated or uncontrolled hypertension can lead to: A heart attack. A stroke. A weakened blood vessel (aneurysm). Heart failure. Kidney damage. Eye damage. Metabolic syndrome. Memory and  concentration problems. Vascular dementia. What actions can I take to manage this condition? Hypertension can be managed by making lifestyle changes and possibly by taking medicines. Your health care provider will help you make a plan to bring yourblood pressure within a normal range. Nutrition  Eat a diet that is high in fiber and potassium, and low in salt (sodium), added sugar, and fat. An example eating plan is called the Dietary Approaches to Stop Hypertension (DASH) diet. To eat this way: Eat plenty of fresh fruits and vegetables. Try to fill one-half of your plate at each meal with fruits and vegetables. Eat whole grains, such as whole-wheat pasta, brown rice, or whole-grain bread. Fill about one-fourth of your plate with whole grains. Eat low-fat dairy products. Avoid fatty cuts of meat, processed or cured meats, and poultry with skin. Fill about one-fourth of your plate with lean proteins such as fish, chicken without skin, beans, eggs, and tofu. Avoid pre-made and processed foods. These tend to be higher in sodium, added sugar, and fat. Reduce your daily sodium intake. Most people with hypertension should eat less than 1,500 mg of sodium a day.  Lifestyle  Work with your health care provider to maintain a healthy body weight or to lose weight. Ask what an ideal weight is for you. Get at least 30 minutes of exercise that causes your heart to beat faster (aerobic exercise) most days of the week. Activities may include walking, swimming, or biking. Include exercise to strengthen your muscles (resistance exercise), such as weight lifting, as part of your weekly exercise routine. Try to do these types of exercises for 30 minutes at least 3 days a week. Do not use any products that contain nicotine or tobacco, such as cigarettes, e-cigarettes, and chewing   tobacco. If you need help quitting, ask your health care provider. Control any long-term (chronic) conditions you have, such as high  cholesterol or diabetes. Identify your sources of stress and find ways to manage stress. This may include meditation, deep breathing, or making time for fun activities.  Alcohol use Do not drink alcohol if: Your health care provider tells you not to drink. You are pregnant, may be pregnant, or are planning to become pregnant. If you drink alcohol: Limit how much you use to: 0-1 drink a day for women. 0-2 drinks a day for men. Be aware of how much alcohol is in your drink. In the [place] one drink equals one 12 oz bottle of beer (355 mL), one 5 oz glass of wine (148 mL), or one 1 oz glass of hard liquor (44 mL). Medicines Your health care provider may prescribe medicine if lifestyle changes are not enough to get your blood pressure under control and if: Your systolic blood pressure is 130 or higher. Your diastolic blood pressure is 80 or higher. Take medicines only as told by your health care provider. Follow the directions carefully. Blood pressure medicines must be taken as told by your health care provider. The medicine does not work as well when you skip doses. Skippingdoses also puts you at risk for problems. Monitoring Before you monitor your blood pressure: Do not smoke, drink caffeinated beverages, or exercise within 30 minutes before taking a measurement. Use the bathroom and empty your bladder (urinate). Sit quietly for at least 5 minutes before taking measurements. Monitor your blood pressure at home as told by your health care provider. To do this: Sit with your back straight and supported. Place your feet flat on the floor. Do not cross your legs. Support your arm on a flat surface, such as a table. Make sure your upper arm is at heart level. Each time you measure, take two or three readings one minute apart and record the results. You may also need to have your blood pressure checked regularly by your healthcare provider. General information Talk with your health care  provider about your diet, exercise habits, and other lifestyle factors that may be contributing to hypertension. Review all the medicines you take with your health care provider because there may be side effects or interactions. Keep all visits as told by your health care provider. Your health care provider can help you create and adjust your plan for managing your high blood pressure. Where to find more information National Heart, Lung, and Blood Institute: www.nhlbi.nih.gov American Heart Association: www.heart.org Contact a health care provider if: You think you are having a reaction to medicines you have taken. You have repeated (recurrent) headaches. You feel dizzy. You have swelling in your ankles. You have trouble with your vision. Get help right away if: You develop a severe headache or confusion. You have unusual weakness or numbness, or you feel faint. You have severe pain in your chest or abdomen. You vomit repeatedly. You have trouble breathing. These symptoms may represent a serious problem that is an emergency. Do not wait to see if the symptoms will go away. Get medical help right away. Call your local emergency services (911 in the [place] Do not drive yourself to the hospital. Summary Hypertension is when the force of blood pumping through your arteries is too strong. If this condition is not controlled, it may put you at risk for serious complications. Your personal target blood pressure may vary depending on your medical conditions,   your age, and other factors. For most people, a normal blood pressure is less than 120/80. Hypertension is managed by lifestyle changes, medicines, or both. Lifestyle changes to help manage hypertension include losing weight, eating a healthy, low-sodium diet, exercising more, stopping smoking, and limiting alcohol. This information is not intended to replace advice given to you by your health care provider. Make sure you discuss any questions  you have with your healthcare provider. Document Revised: 02/02/2019 Document Reviewed: 11/28/2018 Elsevier Patient Education  2022 Elsevier Inc.  

## 2020-08-08 NOTE — Progress Notes (Signed)
[place],   [address] Phone:  [phone]   Fax:  [phone]   Established Patient Office Visit  Subjective:  Patient ID: Cynthia Patton, female    DOB: [birth year]  Age: 60 y.o. MRN: [id number]  CC:  Chief Complaint  Patient presents with   Follow-up    No questions or concerns.     HPI Cynthia Patton presents for follow up. She  has a past medical history of Acute low back pain (03/25/2020), Anxiety, Cancer ([place]) (2000?), Depression, Diabetes mellitus without complication ([place]), Hyperlipidemia, Hypertension, and Thyroid disease.   Hypertension Patient is here for follow-up of elevated blood pressure. She is not exercising and is adherent to a low-salt diet.  Cardiac symptoms: none. Patient denies chest pain, fatigue, lower extremity edema, and palpitations. Cardiovascular risk factors: diabetes mellitus, dyslipidemia, hypertension, obesity (BMI >= 30 kg/m2), sedentary lifestyle, and smoking/ tobacco exposure. Use of agents associated with hypertension: thyroid hormones. History of target organ damage: none.  She has not been seen by nephrology for the cyst . She is going to see Urology on August 14, 2020.   She continues to suffer with depression. The Citalopram 10 mg is effective. She does need her FMLA updated. She is requesting additional time. She had to move from her apt. She is currently living with her elderly aunt.  Past Medical History:  Diagnosis Date   Acute low back pain 03/25/2020   Anxiety    Cancer ([place]) 2000?   thyroid cancer   Depression    Diabetes mellitus without complication ([place])    Hyperlipidemia    Hypertension    Thyroid disease     Past Surgical History:  Procedure Laterality Date   ABDOMINAL HYSTERECTOMY     BREAST CYST EXCISION Left    CHOLECYSTECTOMY     INCISE AND DRAIN ABCESS     on right leg    THYROIDECTOMY      Family History  Problem Relation Age of Onset   Kidney disease  Mother    Hypertension Mother    Hypertension Sister    Heart disease Brother    Colon cancer Neg Hx    Colon polyps Neg Hx    Esophageal cancer Neg Hx    Rectal cancer Neg Hx    Stomach cancer Neg Hx     Social History   Socioeconomic History   Marital status: Single    Spouse name: Not on file   Number of children: Not on file   Years of education: Not on file   Highest education level: Not on file  Occupational History   Not on file  Tobacco Use   Smoking status: Every Day    Packs/day: 0.50    Types: Cigarettes   Smokeless tobacco: Never  Vaping Use   Vaping Use: Never used  Substance and Sexual Activity   Alcohol use: No   Drug use: No   Sexual activity: Not Currently  Other Topics Concern   Not on file  Social History Narrative   Not on file   Social Determinants of Health   Financial Resource Strain: Not on file  Food Insecurity: Not on file  Transportation Needs: Not on file  Physical Activity: Not on file  Stress: Not on file  Social Connections: Not on file  Intimate Partner Violence: Not on file    Outpatient Medications Prior to Visit  Medication Sig Dispense Refill   albuterol (PROVENTIL) (  2.5 MG/3ML) 0.083% nebulizer solution Take 3 mLs (2.5 mg total) by nebulization every 4 (four) hours as needed for wheezing or shortness of breath. 75 mL 2   blood glucose meter kit and supplies KIT Dispense based on patient and insurance preference. Use up to four times daily as directed. (FOR ICD-9 250.00, 250.01). 1 each 0   citalopram (CELEXA) 10 MG tablet Take 1 tablet (10 mg total) by mouth daily. 30 tablet 11   dicyclomine (BENTYL) 10 MG capsule Take 1 capsule (10 mg total) by mouth 3 (three) times daily before meals. Dicyclomine TID AC x5 days, then TID PRN abdominal cramping/spasms 30 capsule 0   diltiazem (DILT-XR) 180 MG 24 hr capsule Take 1 capsule (180 mg total) by mouth daily. 90 capsule 3   gabapentin (NEURONTIN) 100 MG capsule Take 1 capsule (100  mg total) by mouth 2 (two) times daily. 60 capsule 1   ibuprofen (ADVIL) 800 MG tablet TAKE 1 TABLET BY MOUTH EVERY 8 HOURS AS NEEDED 30 tablet 0   levothyroxine (SYNTHROID) 175 MCG tablet Take by mouth.     losartan (COZAAR) 50 MG tablet Take 50 mg by mouth daily.     mometasone (NASONEX) 50 MCG/ACT nasal spray Place 2 sprays into the nose daily. (Patient taking differently: Place 2 sprays into the nose daily as needed (nasal congestion).) 17 g 1   ONETOUCH VERIO test strip USE UP TO 4 TIMES DAILY AS DIRECTED     Semaglutide, 1 MG/DOSE, 2 MG/1.5ML SOPN Inject into the skin.     No facility-administered medications prior to visit.    Allergies  Allergen Reactions   Floxin [Ocuflox] Other (See Comments)    Disoriented   Ofloxacin Other (See Comments)    Disoriented   Percocet [Oxycodone-Acetaminophen]     ROS Review of Systems    Objective:    Physical Exam Constitutional:      Appearance: She is obese.  HENT:     Head: Normocephalic and atraumatic.  Cardiovascular:     Rate and Rhythm: Normal rate and regular rhythm.     Pulses: Normal pulses.     Heart sounds: Normal heart sounds.  Pulmonary:     Effort: Pulmonary effort is normal.     Breath sounds: Normal breath sounds.     Comments: Diminished expiratory BS  Abdominal:     Palpations: Abdomen is soft.     Comments: hypoactive  Musculoskeletal:        General: Normal range of motion.     Cervical back: Normal range of motion.  Skin:    General: Skin is warm and dry.     Capillary Refill: Capillary refill takes less than 2 seconds.  Neurological:     General: No focal deficit present.     Mental Status: She is alert and oriented to person, place, and time.  Psychiatric:        Mood and Affect: Mood normal.        Thought Content: Thought content normal.        Judgment: Judgment normal.    BP (!) 168/60 (BP Location: Right Arm, Patient Position: Sitting)   Pulse 83   Temp 97.8 F (36.6 C)   Ht '5\' 4"'   (1.626 m)   Wt 203 lb (92.1 kg)   SpO2 96%   BMI 34.84 kg/m  Wt Readings from Last 3 Encounters:  08/07/20 203 lb (92.1 kg)  06/24/20 204 lb 0.2 oz (92.5 kg)  03/25/20 209 lb (  94.8 kg)     Health Maintenance Due  Topic Date Due   Zoster Vaccines- Shingrix (1 of 2) Never done   PAP SMEAR-Modifier  Never done   Pneumococcal Vaccine 71-59 Years old (2 - PCV) 03/19/2017   COVID-19 Vaccine (4 - Booster for Pfizer series) 04/25/2020   OPHTHALMOLOGY EXAM  07/11/2020   FOOT EXAM  07/26/2020   HEMOGLOBIN A1C  08/07/2020    There are no preventive care reminders to display for this patient.  Lab Results  Component Value Date   TSH 14.280 (H) 07/13/2018   Lab Results  Component Value Date   WBC 9.8 06/24/2020   HGB 12.6 06/24/2020   HCT 38.5 06/24/2020   MCV 92 06/24/2020   PLT 363 06/24/2020   Lab Results  Component Value Date   NA 137 04/10/2020   K 3.6 04/10/2020   CO2 27 04/10/2020   GLUCOSE 107 (H) 04/10/2020   BUN 14 04/10/2020   CREATININE 0.93 04/10/2020   BILITOT 0.4 07/27/2019   ALKPHOS 94 07/27/2019   AST 10 07/27/2019   ALT 13 02/12/2019   PROT 7.2 07/27/2019   ALBUMIN 4.3 07/27/2019   CALCIUM 9.1 04/10/2020   ANIONGAP 6 04/10/2020   Lab Results  Component Value Date   CHOL 162 02/08/2020   Lab Results  Component Value Date   HDL 44 02/08/2020   Lab Results  Component Value Date   LDLCALC 100 (H) 02/08/2020   Lab Results  Component Value Date   TRIG 97 02/08/2020   Lab Results  Component Value Date   CHOLHDL 3.7 02/08/2020   Lab Results  Component Value Date   HGBA1C 6.2 (A) 02/08/2020   HGBA1C 6.2 02/08/2020   HGBA1C 6.2 02/08/2020   HGBA1C 6.2 02/08/2020      Assessment & Plan:   Problem List Items Addressed This Visit       Cardiovascular and Mediastinum   Essential hypertension - Primary Persistent Encouraged on going compliance with current medication regimen Encouraged home monitoring and recording BP <130/80 Eating a  heart-healthy diet with less salt Encouraged regular physical activity  Recommend Weight loss       Endocrine   Controlled type 2 diabetes mellitus with complication, without long-term current use of insulin (HCC) Stable Continue with current regimen.  No changes warranted. Good patient compliance. Followed by endo     Relevant Orders   Urinalysis Dipstick (Completed)   Other Visit Diagnoses     Acquired hypothyroidism    Stable Followed by endo     Moderate episode of recurrent major depressive disorder ([place])     Persistent  Discussed counseling currently with job Discussed exercise in particular may have an especially positive effect on depression. three to five exercise sessions per week, that last 45 to 60 minutes per session, for at least 10 weeks, and that involve aerobic exercise (such as walking, running, or cycling) or resistance training (upper and lower body weight lifting). FMLA papers completed copy given to patient       No orders of the defined types were placed in this encounter.   Follow-up: Return in about 3 months (around 11/07/2020).    Vevelyn Francois, NP

## 2020-09-18 ENCOUNTER — Other Ambulatory Visit: Payer: Self-pay | Admitting: Nurse Practitioner

## 2020-09-18 ENCOUNTER — Telehealth: Payer: Self-pay

## 2020-09-18 MED ORDER — BENZONATATE 100 MG PO CAPS: 100.0000 mg | ORAL_CAPSULE | Freq: Three times a day (TID) | ORAL | 0 refills | Status: AC | PRN

## 2020-09-18 NOTE — Progress Notes (Signed)
   [facility] [address] [place], [place]  [address] Phone:  [phone]   Fax:  [phone] 

## 2020-09-18 NOTE — Telephone Encounter (Signed)
Pt said she has COVID and wants a call from you and she said that she is sending you a message on MY CHART

## 2020-09-19 ENCOUNTER — Other Ambulatory Visit: Payer: Self-pay | Admitting: Nurse Practitioner

## 2020-09-19 MED ORDER — TUSSIONEX PENNKINETIC ER 10-8 MG/5ML PO SUER: 5.0000 mL | Freq: Two times a day (BID) | ORAL | 0 refills | Status: DC | PRN

## 2020-09-19 NOTE — Progress Notes (Unsigned)
   [facility] [address] [place], [place]  [address] Phone:  [phone]   Fax:  [phone] 

## 2020-09-22 ENCOUNTER — Other Ambulatory Visit (HOSPITAL_COMMUNITY): Payer: Self-pay

## 2020-09-22 ENCOUNTER — Other Ambulatory Visit: Payer: Self-pay | Admitting: Nurse Practitioner

## 2020-09-22 MED ORDER — NIRMATRELVIR/RITONAVIR (PAXLOVID)TABLET
3.0000 | ORAL_TABLET | Freq: Two times a day (BID) | ORAL | 0 refills | Status: AC
  Filled 2020-09-22: qty 30, 5d supply, fill #0

## 2020-10-03 ENCOUNTER — Encounter: Payer: Self-pay | Admitting: Nurse Practitioner

## 2020-10-03 ENCOUNTER — Other Ambulatory Visit: Payer: Self-pay | Admitting: Nurse Practitioner

## 2020-10-03 ENCOUNTER — Ambulatory Visit (INDEPENDENT_AMBULATORY_CARE_PROVIDER_SITE_OTHER): Payer: 59 | Admitting: Nurse Practitioner

## 2020-10-03 VITALS — BP 164/58 | HR 85 | Temp 98.0°F | Wt 204.8 lb

## 2020-10-03 DIAGNOSIS — R0989 Other specified symptoms and signs involving the circulatory and respiratory systems: Secondary | ICD-10-CM | POA: Diagnosis not present

## 2020-10-03 DIAGNOSIS — J40 Bronchitis, not specified as acute or chronic: Secondary | ICD-10-CM

## 2020-10-03 DIAGNOSIS — U071 COVID-19: Secondary | ICD-10-CM | POA: Diagnosis not present

## 2020-10-03 MED ORDER — AMOXICILLIN-POT CLAVULANATE 875-125 MG PO TABS: 1.0000 | ORAL_TABLET | Freq: Two times a day (BID) | ORAL | 0 refills | Status: AC

## 2020-10-03 MED ORDER — TUSSIONEX PENNKINETIC ER 10-8 MG/5ML PO SUER: 5.0000 mL | Freq: Two times a day (BID) | ORAL | 0 refills | Status: DC | PRN

## 2020-10-03 MED ORDER — HYDROCODONE BIT-HOMATROP MBR 5-1.5 MG/5ML PO SOLN: 5.0000 mL | Freq: Four times a day (QID) | ORAL | 0 refills | Status: DC | PRN

## 2020-10-03 MED ORDER — LEVALBUTEROL TARTRATE 45 MCG/ACT IN AERO: 1.0000 | INHALATION_SPRAY | RESPIRATORY_TRACT | 2 refills | Status: DC | PRN

## 2020-10-03 MED ORDER — ALBUTEROL SULFATE HFA 108 (90 BASE) MCG/ACT IN AERS: 2.0000 | INHALATION_SPRAY | RESPIRATORY_TRACT | 1 refills | Status: DC | PRN

## 2020-10-03 NOTE — Patient Instructions (Signed)
Acute Bronchitis, Adult  Acute bronchitis is when air tubes in the lungs (bronchi) suddenly get swollen. The condition can make it hard for you to breathe. In adults, acute bronchitis usually goes away within 2 weeks. A cough caused by bronchitis may last up to 3 weeks. Smoking, allergies, and asthma can make thecondition worse. What are the causes? This condition is caused by: Cold and flu viruses. The most common cause of this condition is the virus that causes the common cold. Bacteria. Substances that irritate the lungs, including: Smoke from cigarettes and other types of tobacco. Dust and pollen. Fumes from chemicals, gases, or burned fuel. Other materials that pollute indoor or outdoor air. Close contact with someone who has acute bronchitis. What increases the risk? The following factors may make you more likely to develop this condition: A weak body's defense system. This is also called the immune system. Any condition that affects your lungs and breathing, such as asthma. What are the signs or symptoms? Symptoms of this condition include: A cough. Coughing up clear, yellow, or green mucus. Wheezing. Having too much mucus in your lungs (chest congestion). Shortness of breath. A fever. Chills. Body aches. A sore throat. How is this treated? Acute bronchitis may go away over time without treatment. Your doctor may recommend: Drinking more fluids. Using a device that gets medicine into your lungs (inhaler). Using a vaporizer or a humidifier. These are machines that add water or moisture to the air. This helps with coughing and poor breathing. Taking a medicine for fever. Taking a medicine that thins mucus and clears congestion. Taking a medicine that prevents or stops coughing. Follow these instructions at home: Activity Get a lot of rest. Return to your normal activities as told by your doctor. Ask your doctor what activities are safe for you. Lifestyle  Drink enough  fluid to keep your pee (urine) pale yellow. Do not drink alcohol. Do not use any products that contain nicotine or tobacco, such as cigarettes, e-cigarettes, and chewing tobacco. If you need help quitting, ask your doctor. Be aware that: Your bronchitis will get worse if you smoke or breathe in other people's smoke (secondhand smoke). Your lungs will heal faster if you quit smoking.  General instructions Take over-the-counter and prescription medicines only as told by your doctor. Use an inhaler, cool mist vaporizer, or humidifier as told by your doctor. Rinse your mouth often with salt water. To make salt water, dissolve -1 tsp (3-6 g) of salt in 1 cup (237 mL) of warm water. Take two teaspoons of honey at bedtime. This helps lessen your coughing at night. Keep all follow-up visits as told by your doctor. This is important. How is this prevented? To lower your risk of getting this condition again: Wash your hands often with soap and water. If you cannot use soap and water, use hand sanitizer. Avoid contact with people who have cold symptoms. Try not to touch your mouth, nose, or eyes with your hands. Make sure to get the flu shot every year. Contact a doctor if: Your symptoms do not get better in 2 weeks. You vomit more than once or twice. You have symptoms of loss of fluid from your body (dehydration). These include: Dark pee. Dry skin or eyes. Increased thirst. Headaches. Confusion. Muscle cramps. Get help right away if: You cough up blood. You have chest pain. You have very bad shortness of breath. You become dehydrated. You faint or keep feeling like you are going to faint. You   have a very bad headache. Your fever or chills get worse. These symptoms may be an emergency. Get help right away. Call your local emergency services (911 in the [place] Do not wait to see if the symptoms will go away. Do not drive yourself to the hospital. Summary Acute bronchitis is when air  tubes in the lungs (bronchi) suddenly get swollen. In adults, acute bronchitis usually goes away within 2 weeks. Take over-the-counter and prescription medicines only as told by your doctor. Drink enough fluid to keep your pee (urine) pale yellow. Contact a doctor if your symptoms do not improve after 2 weeks of treatment. Get help right away if you cough up blood, faint, or have chest pain or shortness of breath. This information is not intended to replace advice given to you by your health care provider. Make sure you discuss any questions you have with your healthcare provider. Document Revised: 11/28/2019 Document Reviewed: 07/21/2018 Elsevier Patient Education  2022 Elsevier Inc.  

## 2020-10-03 NOTE — Progress Notes (Signed)
   [place], [place]  [address] Phone:  [phone]   Fax:  [phone] Virtual Visit via Telephone Note  I connected with Cynthia Patton on 10/03/20 at  2:20 PM EDT by telephone and verified that I am speaking with the correct person using two identifiers.   I discussed the limitations, risks, security and privacy concerns of performing an evaluation and management service by telephone and the availability of in person appointments. I also discussed with the patient that there may be a patient responsible charge related to this service. The patient expressed understanding and agreed to proceed.  Patient office Provider Office  History of Present Illness:  Cynthia Patton  has a past medical history of Acute low back pain (03/25/2020), Anxiety, Cancer ([place]) (2000?), Depression, Diabetes mellitus without complication ([place]), Hyperlipidemia, Hypertension, and Thyroid disease.    She reports she needs to have her FMLA forms for COVID. She was out on September 19, 2021 and not been back to work.  She requested that when she was taking the Paxlovid, She was able to take this for 3 days and her symptoms got worse. She stopped taking the medication. She reports that she has not been able to sleep. She reports a low grade fever, cough and congestion. This is now bronchitis. She has a history of this. She is requesting a nebulizer treatment. She reports that this was never received. This was ordered 02/2020. She also reports that she has never received with Tussionex from [facility]. She is requesting this to be sent to [facility]     ROS   Observations/Objective: No exam; telephone visit  Assessment and Plan: 1. Bronchitis - For home use only DME Nebulizer machine - TUSSIONEX PENNKINETIC ER 10-8 MG/5ML SUER; Take 5 mLs by mouth every 12 (twelve) hours as needed for up to 12 days for cough.  Dispense: 115 mL; Refill: 0 - amoxicillin-clavulanate (AUGMENTIN)  875-125 MG tablet; Take 1 tablet by mouth 2 (two) times daily for 10 days.  Dispense: 20 tablet; Refill: 0  2. COVID Referral to at [facility]  3. Symptoms of upper respiratory infection (URI) - albuterol (VENTOLIN HFA) 108 (90 Base) MCG/ACT inhaler; Inhale 2 puffs into the lungs every 4 (four) hours as needed for wheezing or shortness of breath.  Dispense: 1 each; Refill: 1   Follow Up Instructions: follow up as scheduled    I discussed the assessment and treatment plan with the patient. The patient was provided an opportunity to ask questions and all were answered. The patient agreed with the plan and demonstrated an understanding of the instructions.   The patient was advised to call back or seek an in-person evaluation if the symptoms worsen or if the condition fails to improve as anticipated.  I provided 21 minutes of telephone- visit time during this encounter.   Vevelyn Francois, NP

## 2020-10-09 ENCOUNTER — Other Ambulatory Visit: Payer: Self-pay

## 2020-10-09 ENCOUNTER — Encounter: Payer: Self-pay | Admitting: Nurse Practitioner

## 2020-10-09 ENCOUNTER — Ambulatory Visit (INDEPENDENT_AMBULATORY_CARE_PROVIDER_SITE_OTHER): Payer: 59 | Admitting: Nurse Practitioner

## 2020-10-09 ENCOUNTER — Ambulatory Visit (INDEPENDENT_AMBULATORY_CARE_PROVIDER_SITE_OTHER): Payer: 59

## 2020-10-09 DIAGNOSIS — R059 Cough, unspecified: Secondary | ICD-10-CM | POA: Diagnosis not present

## 2020-10-09 DIAGNOSIS — Z8616 Personal history of COVID-19: Secondary | ICD-10-CM | POA: Diagnosis not present

## 2020-10-09 DIAGNOSIS — G9331 Postviral fatigue syndrome: Secondary | ICD-10-CM | POA: Insufficient documentation

## 2020-10-09 DIAGNOSIS — R413 Other amnesia: Secondary | ICD-10-CM | POA: Diagnosis not present

## 2020-10-09 DIAGNOSIS — R519 Headache, unspecified: Secondary | ICD-10-CM

## 2020-10-09 DIAGNOSIS — G933 Postviral fatigue syndrome: Secondary | ICD-10-CM | POA: Diagnosis not present

## 2020-10-09 MED ORDER — PREDNISONE 10 MG PO TABS: ORAL_TABLET | ORAL | 0 refills | Status: DC

## 2020-10-09 NOTE — Patient Instructions (Signed)
Covid 19 Cough Fatigue Headache Memory loss:   Stay well hydrated  Stay active  Deep breathing exercises  May take tylenol for fever or pain  May take mucinex twice daily  Will order chest x ray: [facility]  Please complete entire course of antibiotic  Will order prednisone  May trial Magnesium 600 mg and Riboflavin 400 mg daily    Follow up:  Follow up if needed

## 2020-10-09 NOTE — Progress Notes (Signed)
Virtual Visit via Telephone Note  I connected with Al Pimple on 10/09/20 at  1:50 PM EDT by telephone and verified that I am speaking with the correct person using two identifiers.  Location: Patient: home Provider: office   I discussed the limitations, risks, security and privacy concerns of performing an evaluation and management service by telephone and the availability of in person appointments. I also discussed with the patient that there may be a patient responsible charge related to this service. The patient expressed understanding and agreed to proceed.   History of Present Illness:  Patient presents today for acute visit through televisit.  Patient tested positive for COVID at the beginning of September.  She was seen for follow-up visit by Dionisio David on 10/03/2020 and was prescribed Augmentin.  She has also been prescribed an inhaler.  Patient states that she continues to cough.  She states that her fever has subsided for the past 2 days.  We discussed that she will need an x-ray. Denies f/c/s, n/v/d, hemoptysis, PND, chest pain or edema. '     Observations/Objective:  Vitals with BMI 10/03/2020 10/03/2020 08/07/2020  Height - - 5\' 4"   Weight - 204 lbs 13 oz 203 lbs  BMI - - 38.75  Systolic 643 329 518  Diastolic 58 71 60  Pulse 85 84 83      Assessment and Plan:  Covid 19 Cough Fatigue Headache Memory loss:   Stay well hydrated  Stay active  Deep breathing exercises  May take tylenol for fever or pain  May take mucinex twice daily  Will order chest x ray: [facility]  Please complete entire course of antibiotic  Will order prednisone  May trial Magnesium 600 mg and Riboflavin 400 mg daily    Follow up:  Follow up if needed     I discussed the assessment and treatment plan with the patient. The patient was provided an opportunity to ask questions and all were answered. The patient agreed with the plan and demonstrated an understanding  of the instructions.   The patient was advised to call back or seek an in-person evaluation if the symptoms worsen or if the condition fails to improve as anticipated.  I provided 23 minutes of non-face-to-face time during this encounter.   Fenton Foy, NP

## 2020-10-13 ENCOUNTER — Encounter: Payer: Self-pay | Admitting: Nurse Practitioner

## 2020-10-13 ENCOUNTER — Other Ambulatory Visit: Payer: Self-pay | Admitting: Nurse Practitioner

## 2020-11-07 ENCOUNTER — Other Ambulatory Visit: Payer: Self-pay

## 2020-11-07 ENCOUNTER — Ambulatory Visit (INDEPENDENT_AMBULATORY_CARE_PROVIDER_SITE_OTHER): Payer: 59 | Admitting: Nurse Practitioner

## 2020-11-07 ENCOUNTER — Encounter: Payer: Self-pay | Admitting: Nurse Practitioner

## 2020-11-07 VITALS — BP 144/62 | HR 70 | Temp 98.0°F | Ht 64.0 in | Wt 206.4 lb

## 2020-11-07 DIAGNOSIS — I10 Essential (primary) hypertension: Secondary | ICD-10-CM | POA: Diagnosis not present

## 2020-11-07 DIAGNOSIS — F331 Major depressive disorder, recurrent, moderate: Secondary | ICD-10-CM | POA: Diagnosis not present

## 2020-11-07 DIAGNOSIS — M79604 Pain in right leg: Secondary | ICD-10-CM | POA: Diagnosis not present

## 2020-11-07 DIAGNOSIS — E118 Type 2 diabetes mellitus with unspecified complications: Secondary | ICD-10-CM

## 2020-11-07 DIAGNOSIS — M79605 Pain in left leg: Secondary | ICD-10-CM

## 2020-11-07 MED ORDER — IBUPROFEN 800 MG PO TABS: 800.0000 mg | ORAL_TABLET | Freq: Three times a day (TID) | ORAL | 0 refills | Status: DC | PRN

## 2020-11-07 MED ORDER — CITALOPRAM HYDROBROMIDE 20 MG PO TABS: 20.0000 mg | ORAL_TABLET | Freq: Every day | ORAL | 3 refills | Status: DC

## 2020-11-07 NOTE — Progress Notes (Signed)
[place], [place]  [address] Phone:  [phone]   Fax:  [phone]   Established Patient Office Visit  Subjective:  Patient ID: Cynthia Patton, female    DOB: [birth year]  Age: 60 y.o. MRN: [id number]  CC:  Chief Complaint  Patient presents with   Follow-up    Pt is here today for her follow up visit.      HPI Cynthia Patton presents for follow up. She  has a past medical history of Acute low back pain (03/25/2020), Anxiety, Cancer ([place]) (2000?), Depression, Diabetes mellitus without complication ([place] [place]), Hyperlipidemia, Hypertension, and Thyroid disease.   She is followed by endocrinology for her DM and hypothyroidism.   She is following up today for her HTN. She is currently on Diltiazem 180 mg. She report also taking the Losartan 50 mg which is prescribed by [facility] due to her BP remaining elevated. Denies headache, dizziness, visual changes, shortness of breath, dyspnea on exertion, chest pain, nausea, vomiting or any edema.   She reports that she has continued to smoke. She does want to quit a some point but this is her only "vice".  She does continue to live with her aunt. This is stressful.  She is not the primary caregiver for her aunt.  She is taking her citalopram 10 mg daily.  This does help some however she feels like she may benefit from an increase.  She continues to work and reports being off on Fridays.  She denies any additional concerns today.    Past Medical History:  Diagnosis Date   Acute low back pain 03/25/2020   Anxiety    Cancer ([place]) 2000?   thyroid cancer   Depression    Diabetes mellitus without complication ([place])    Hyperlipidemia    Hypertension    Thyroid disease     Past Surgical History:  Procedure Laterality Date   ABDOMINAL HYSTERECTOMY     BREAST CYST EXCISION Left    CHOLECYSTECTOMY     INCISE AND DRAIN ABCESS     on right leg    THYROIDECTOMY      Family History  Problem Relation  Age of Onset   Kidney disease Mother    Hypertension Mother    Hypertension Sister    Heart disease Brother    Colon cancer Neg Hx    Colon polyps Neg Hx    Esophageal cancer Neg Hx    Rectal cancer Neg Hx    Stomach cancer Neg Hx     Social History   Socioeconomic History   Marital status: Single    Spouse name: Not on file   Number of children: Not on file   Years of education: Not on file   Highest education level: Not on file  Occupational History   Not on file  Tobacco Use   Smoking status: Every Day    Packs/day: 0.50    Types: Cigarettes   Smokeless tobacco: Never  Vaping Use   Vaping Use: Never used  Substance and Sexual Activity   Alcohol use: No   Drug use: No   Sexual activity: Not Currently  Other Topics Concern   Not on file  Social History Narrative   Not on file   Social Determinants of Health   Financial Resource Strain: Not on file  Food Insecurity: Not on file  Transportation Needs: Not on file  Physical Activity: Not on file  Stress: Not on  file  Social Connections: Not on file  Intimate Partner Violence: Not on file    Outpatient Medications Prior to Visit  Medication Sig Dispense Refill   albuterol (PROVENTIL) (2.5 MG/3ML) 0.083% nebulizer solution Take 3 mLs (2.5 mg total) by nebulization every 4 (four) hours as needed for wheezing or shortness of breath. 75 mL 2   blood glucose meter kit and supplies KIT Dispense based on patient and insurance preference. Use up to four times daily as directed. (FOR ICD-9 250.00, 250.01). 1 each 0   levalbuterol (XOPENEX HFA) 45 MCG/ACT inhaler Inhale 1 puff into the lungs every 4 (four) hours as needed for wheezing. 1 each 2   levothyroxine (SYNTHROID) 175 MCG tablet Take by mouth.     losartan (COZAAR) 50 MG tablet Take 50 mg by mouth daily.     mometasone (NASONEX) 50 MCG/ACT nasal spray Place 2 sprays into the nose daily. 17 g 1   ONETOUCH VERIO test strip USE UP TO 4 TIMES DAILY AS DIRECTED      HYDROcodone bit-homatropine (HYCODAN) 5-1.5 MG/5ML syrup Take 5 mLs by mouth every 6 (six) hours as needed for cough. 120 mL 0   ibuprofen (ADVIL) 800 MG tablet TAKE 1 TABLET BY MOUTH EVERY 8 HOURS AS NEEDED 30 tablet 0   diltiazem (DILT-XR) 180 MG 24 hr capsule Take 1 capsule (180 mg total) by mouth daily. 90 capsule 3   gabapentin (NEURONTIN) 100 MG capsule Take 1 capsule (100 mg total) by mouth 2 (two) times daily. (Patient not taking: No sig reported) 60 capsule 1   Semaglutide, 1 MG/DOSE, 2 MG/1.5ML SOPN Inject into the skin. (Patient not taking: Reported on 11/07/2020)     citalopram (CELEXA) 10 MG tablet Take 1 tablet (10 mg total) by mouth daily. 30 tablet 11   dicyclomine (BENTYL) 10 MG capsule Take 1 capsule (10 mg total) by mouth 3 (three) times daily before meals. Dicyclomine TID AC x5 days, then TID PRN abdominal cramping/spasms (Patient not taking: No sig reported) 30 capsule 0   predniSONE (DELTASONE) 10 MG tablet Take 4 tabs for 2 days, then 3 tabs for 2 days, then 2 tabs for 2 days, then 1 tab for 2 days, then stop (Patient not taking: No sig reported) 20 tablet 0   No facility-administered medications prior to visit.    Allergies  Allergen Reactions   Floxin [Ocuflox] Other (See Comments)    Disoriented   Ofloxacin Other (See Comments)    Disoriented   Percocet [Oxycodone-Acetaminophen]     ROS Review of Systems    Objective:    Physical Exam Constitutional:      Appearance: She is obese.  HENT:     Head: Normocephalic and atraumatic.     Nose: Nose normal.     Mouth/Throat:     Mouth: Mucous membranes are moist.  Cardiovascular:     Rate and Rhythm: Normal rate and regular rhythm.     Pulses: Normal pulses.     Heart sounds: Normal heart sounds.  Pulmonary:     Effort: Pulmonary effort is normal.     Breath sounds: Normal breath sounds.  Abdominal:     Palpations: Abdomen is soft.  Musculoskeletal:        General: Normal range of motion.     Cervical  back: Normal range of motion.     Right lower leg: No edema.     Left lower leg: No edema.  Skin:    General: Skin is  warm and dry.     Capillary Refill: Capillary refill takes less than 2 seconds.  Neurological:     General: No focal deficit present.     Mental Status: She is alert and oriented to person, place, and time.   BP (!) 144/62   Pulse 70   Temp 98 F (36.7 C)   Ht '5\' 4"'  (1.626 m)   Wt 206 lb 6.4 oz (93.6 kg)   SpO2 97%   BMI 35.43 kg/m  Wt Readings from Last 3 Encounters:  11/07/20 206 lb 6.4 oz (93.6 kg)  10/03/20 204 lb 12.8 oz (92.9 kg)  08/07/20 203 lb (92.1 kg)     There are no preventive care reminders to display for this patient.   There are no preventive care reminders to display for this patient.  Lab Results  Component Value Date   TSH 14.280 (H) 07/13/2018   Lab Results  Component Value Date   WBC 9.8 06/24/2020   HGB 12.6 06/24/2020   HCT 38.5 06/24/2020   MCV 92 06/24/2020   PLT 363 06/24/2020   Lab Results  Component Value Date   NA 137 04/10/2020   K 3.6 04/10/2020   CO2 27 04/10/2020   GLUCOSE 107 (H) 04/10/2020   BUN 14 04/10/2020   CREATININE 0.93 04/10/2020   BILITOT 0.4 07/27/2019   ALKPHOS 94 07/27/2019   AST 10 07/27/2019   ALT 13 02/12/2019   PROT 7.2 07/27/2019   ALBUMIN 4.3 07/27/2019   CALCIUM 9.1 04/10/2020   ANIONGAP 6 04/10/2020   Lab Results  Component Value Date   CHOL 162 02/08/2020   Lab Results  Component Value Date   HDL 44 02/08/2020   Lab Results  Component Value Date   LDLCALC 100 (H) 02/08/2020   Lab Results  Component Value Date   TRIG 97 02/08/2020   Lab Results  Component Value Date   CHOLHDL 3.7 02/08/2020   Lab Results  Component Value Date   HGBA1C 6.2 (A) 02/08/2020   HGBA1C 6.2 02/08/2020   HGBA1C 6.2 02/08/2020   HGBA1C 6.2 02/08/2020      Assessment & Plan:   Problem List Items Addressed This Visit       Cardiovascular and Mediastinum   Essential  hypertension Stable Encouraged on going compliance with current medication regimen Encouraged home monitoring and recording BP <130/80 Eating a heart-healthy diet with less salt Encouraged regular physical activity  Recommend Weight loss Encourage smoking cessation      Endocrine   Controlled type 2 diabetes mellitus with complication, without long-term current use of insulin ([place]) - Primary Stable continue to follow-up with endocrinology   Other Visit Diagnoses     Bilateral leg pain     Persistent requesting refill on ibuprofen   Relevant Medications   ibuprofen (ADVIL) 800 MG tablet   Moderate episode of recurrent major depressive disorder (HCC)     Persistent Increase citalopram 20 mg daily       Meds ordered this encounter  Medications   ibuprofen (ADVIL) 800 MG tablet    Sig: Take 1 tablet (800 mg total) by mouth every 8 (eight) hours as needed.    Dispense:  30 tablet    Refill:  0     Follow-up: Return in about 6 months (around 05/08/2021).    Vevelyn Francois, NP

## 2020-11-07 NOTE — Patient Instructions (Signed)
Managing Your Hypertension Hypertension, also called high blood pressure, is when the force of the blood pressing against the walls of the arteries is too strong. Arteries are blood vessels that carry blood from your heart throughout your body. Hypertension forces the heart to work harder to pump blood and may cause the arteries to become narrow or stiff. Understanding blood pressure readings Your personal target blood pressure may vary depending on your medical conditions, your age, and other factors. A blood pressure reading includes a higher number over a lower number. Ideally, your blood pressure should be below 120/80. You should know that: The first, or top, number is called the systolic pressure. It is a measure of the pressure in your arteries as your heart beats. The second, or bottom number, is called the diastolic pressure. It is a measure of the pressure in your arteries as the heart relaxes. Blood pressure is classified into four stages. Based on your blood pressure reading, your health care provider may use the following stages to determine what type of treatment you need, if any. Systolic pressure and diastolic pressure are measured in a unit called mmHg. Normal Systolic pressure: below 120. Diastolic pressure: below 80. Elevated Systolic pressure: 120-129. Diastolic pressure: below 80. Hypertension stage 1 Systolic pressure: 130-139. Diastolic pressure: 80-89. Hypertension stage 2 Systolic pressure: 140 or above. Diastolic pressure: 90 or above. How can this condition affect me? Managing your hypertension is an important responsibility. Over time, hypertension can damage the arteries and decrease blood flow to important parts of the body, including the brain, heart, and kidneys. Having untreated or uncontrolled hypertension can lead to: A heart attack. A stroke. A weakened blood vessel (aneurysm). Heart failure. Kidney damage. Eye damage. Metabolic syndrome. Memory and  concentration problems. Vascular dementia. What actions can I take to manage this condition? Hypertension can be managed by making lifestyle changes and possibly by taking medicines. Your health care provider will help you make a plan to bring your blood pressure within a normal range. Nutrition  Eat a diet that is high in fiber and potassium, and low in salt (sodium), added sugar, and fat. An example eating plan is called the Dietary Approaches to Stop Hypertension (DASH) diet. To eat this way: Eat plenty of fresh fruits and vegetables. Try to fill one-half of your plate at each meal with fruits and vegetables. Eat whole grains, such as whole-wheat pasta, brown rice, or whole-grain bread. Fill about one-fourth of your plate with whole grains. Eat low-fat dairy products. Avoid fatty cuts of meat, processed or cured meats, and poultry with skin. Fill about one-fourth of your plate with lean proteins such as fish, chicken without skin, beans, eggs, and tofu. Avoid pre-made and processed foods. These tend to be higher in sodium, added sugar, and fat. Reduce your daily sodium intake. Most people with hypertension should eat less than 1,500 mg of sodium a day. Lifestyle  Work with your health care provider to maintain a healthy body weight or to lose weight. Ask what an ideal weight is for you. Get at least 30 minutes of exercise that causes your heart to beat faster (aerobic exercise) most days of the week. Activities may include walking, swimming, or biking. Include exercise to strengthen your muscles (resistance exercise), such as weight lifting, as part of your weekly exercise routine. Try to do these types of exercises for 30 minutes at least 3 days a week. Do not use any products that contain nicotine or tobacco, such as cigarettes, e-cigarettes,   and chewing tobacco. If you need help quitting, ask your health care provider. Control any long-term (chronic) conditions you have, such as high  cholesterol or diabetes. Identify your sources of stress and find ways to manage stress. This may include meditation, deep breathing, or making time for fun activities. Alcohol use Do not drink alcohol if: Your health care provider tells you not to drink. You are pregnant, may be pregnant, or are planning to become pregnant. If you drink alcohol: Limit how much you use to: 0-1 drink a day for women. 0-2 drinks a day for men. Be aware of how much alcohol is in your drink. In the [place] one drink equals one 12 oz bottle of beer (355 mL), one 5 oz glass of wine (148 mL), or one 1 oz glass of hard liquor (44 mL). Medicines Your health care provider may prescribe medicine if lifestyle changes are not enough to get your blood pressure under control and if: Your systolic blood pressure is 130 or higher. Your diastolic blood pressure is 80 or higher. Take medicines only as told by your health care provider. Follow the directions carefully. Blood pressure medicines must be taken as told by your health care provider. The medicine does not work as well when you skip doses. Skipping doses also puts you at risk for problems. Monitoring Before you monitor your blood pressure: Do not smoke, drink caffeinated beverages, or exercise within 30 minutes before taking a measurement. Use the bathroom and empty your bladder (urinate). Sit quietly for at least 5 minutes before taking measurements. Monitor your blood pressure at home as told by your health care provider. To do this: Sit with your back straight and supported. Place your feet flat on the floor. Do not cross your legs. Support your arm on a flat surface, such as a table. Make sure your upper arm is at heart level. Each time you measure, take two or three readings one minute apart and record the results. You may also need to have your blood pressure checked regularly by your health care provider. General information Talk with your health care  provider about your diet, exercise habits, and other lifestyle factors that may be contributing to hypertension. Review all the medicines you take with your health care provider because there may be side effects or interactions. Keep all visits as told by your health care provider. Your health care provider can help you create and adjust your plan for managing your high blood pressure. Where to find more information National Heart, Lung, and Blood Institute: www.nhlbi.nih.gov American Heart Association: www.heart.org Contact a health care provider if: You think you are having a reaction to medicines you have taken. You have repeated (recurrent) headaches. You feel dizzy. You have swelling in your ankles. You have trouble with your vision. Get help right away if: You develop a severe headache or confusion. You have unusual weakness or numbness, or you feel faint. You have severe pain in your chest or abdomen. You vomit repeatedly. You have trouble breathing. These symptoms may represent a serious problem that is an emergency. Do not wait to see if the symptoms will go away. Get medical help right away. Call your local emergency services (911 in the [place] Do not drive yourself to the hospital. Summary Hypertension is when the force of blood pumping through your arteries is too strong. If this condition is not controlled, it may put you at risk for serious complications. Your personal target blood pressure may vary depending on   your medical conditions, your age, and other factors. For most people, a normal blood pressure is less than 120/80. Hypertension is managed by lifestyle changes, medicines, or both. Lifestyle changes to help manage hypertension include losing weight, eating a healthy, low-sodium diet, exercising more, stopping smoking, and limiting alcohol. This information is not intended to replace advice given to you by your health care provider. Make sure you discuss any questions  you have with your health care provider. Document Revised: 02/02/2019 Document Reviewed: 11/28/2018 Elsevier Patient Education  2022 [employer].  Major Depressive Disorder, Adult Major depressive disorder is a mental health condition. This disorder affects feelings. It can also affect the body. Symptoms of this condition last most of the day, almost every day, for 2 weeks. This disorder can affect: Relationships. Daily activities, such as work and school. Activities that you normally like to do. What are the causes? The cause of this condition is not known. The disorder is likely caused by a mix of things, including: Your personality, such as being a shy person. Your behavior, or how you act toward others. Your thoughts and feelings. Too much alcohol or drugs. How you react to stress. Health and mental problems that you have had for a long time. Things that hurt you in the past (trauma). Big changes in your life, such as divorce. What increases the risk? The following factors may make you more likely to develop this condition: Having family members with depression. Being a woman. Problems in the family. Low levels of some brain chemicals. Things that caused you pain as a child, especially if you lost a parent or were abused. A lot of stress in your life, such as from: Living without basic needs of life, such as food and shelter. Being treated poorly because of race, sex, or religion (discrimination). Health and mental problems that you have had for a long time. What are the signs or symptoms? The main symptoms of this condition are: Being sad all the time. Being grouchy all the time. Loss of interest in things and activities. Other symptoms include: Sleeping too much or too little. Eating too much or too little. Gaining or losing weight, without knowing why. Feeling tired or having low energy. Being restless and weak. Feeling hopeless, worthless, or guilty. Trouble  thinking clearly or making decisions. Thoughts of hurting yourself or others, or thoughts of ending your life. Spending a lot of time alone. Inability to complete common tasks of daily life. If you have very bad MDD, you may: Believe things that are not true. Hear, see, taste, or feel things that are not there. Have mild depression that lasts for at least 2 years. Feel very sad and hopeless. Have trouble speaking or moving. How is this treated? This condition may be treated with: Talk therapy. This teaches you to know bad thoughts, feelings, and actions and how to change them. This can also help you to communicate with others. This can be done with members of your family. Medicines. These can be used to treat worry (anxiety), depression, or low levels of chemicals in the brain. Lifestyle changes. You may need to: Limit alcohol use. Limit drug use. Get regular exercise. Get plenty of sleep. Make healthy eating choices. Spend more time outdoors. Brain stimulation. This treatment excites the brain. This is done when symptoms are very bad or have not gotten better with other treatments. Follow these instructions at home: Activity Get regular exercise as told. Spend time outdoors as told. Make time to  do the things you enjoy. Find ways to deal with stress. Try to: Meditate. Do deep breathing. Spend time in nature. Keep a journal. Return to your normal activities as told by your doctor. Ask your doctor what activities are safe for you. Alcohol and drug use If you drink alcohol: Limit how much you use to: 0-1 drink a day for women. 0-2 drinks a day for men. Be aware of how much alcohol is in your drink. In the [place] one drink equals one 12 oz bottle of beer (355 mL), one 5 oz glass of wine (148 mL), or one 1 oz glass of hard liquor (44 mL). Talk to your doctor about: Alcohol use. Alcohol can affect some medicines. Any drug use. General instructions  Take over-the-counter and  prescription medicines and herbal preparations only as told by your doctor. Eat a healthy diet. Get a lot of sleep. Think about joining a support group. Your doctor may be able to suggest one. Keep all follow-up visits as told by your doctor. This is important. Where to find more information: [employer] on Mental Illness: www.nami.[place]: https://carter.com/ American Psychiatric Association: www.psychiatry.org/patients-families/ Contact a doctor if: Your symptoms get worse. You get new symptoms. Get help right away if: You hurt yourself. You have serious thoughts about hurting yourself or others. You see, hear, taste, smell, or feel things that are not there. If you ever feel like you may hurt yourself or others, or have thoughts about taking your own life, get help right away. Go to your nearest emergency department or: Call your local emergency services (911 in the [place] Call a suicide crisis helpline, such as the [facility] at (405)215-0551. This is open 24 hours a day in the [place] Text the Crisis Text Line at 816 696 5713 (in the [place].). Summary Major depressive disorder is a mental health condition. This disorder affects feelings. Symptoms of this condition last most of the day, almost every day, for 2 weeks. The symptoms of this disorder can cause problems with relationships and with daily activities. There are treatments and support for people who get this disorder. You may need more than one type of treatment. Get help right away if you have serious thoughts about hurting yourself or others. This information is not intended to replace advice given to you by your health care provider. Make sure you discuss any questions you have with your health care provider. Document Revised: 12/09/2018 Document Reviewed: 12/09/2018 Elsevier Patient Education  2022 [employer].

## 2020-11-24 ENCOUNTER — Other Ambulatory Visit: Payer: Self-pay

## 2020-11-24 DIAGNOSIS — I10 Essential (primary) hypertension: Secondary | ICD-10-CM

## 2020-11-24 MED ORDER — DILTIAZEM HCL ER 180 MG PO CP24: 180.0000 mg | ORAL_CAPSULE | Freq: Every day | ORAL | 3 refills | Status: DC

## 2021-02-02 DIAGNOSIS — E119 Type 2 diabetes mellitus without complications: Secondary | ICD-10-CM | POA: Diagnosis not present

## 2021-02-02 DIAGNOSIS — E890 Postprocedural hypothyroidism: Secondary | ICD-10-CM | POA: Diagnosis not present

## 2021-02-02 DIAGNOSIS — Z85850 Personal history of malignant neoplasm of thyroid: Secondary | ICD-10-CM | POA: Diagnosis not present

## 2021-02-02 DIAGNOSIS — Z79890 Hormone replacement therapy (postmenopausal): Secondary | ICD-10-CM | POA: Diagnosis not present

## 2021-02-02 DIAGNOSIS — C73 Malignant neoplasm of thyroid gland: Secondary | ICD-10-CM | POA: Diagnosis not present

## 2021-02-02 DIAGNOSIS — E118 Type 2 diabetes mellitus with unspecified complications: Secondary | ICD-10-CM | POA: Diagnosis not present

## 2021-02-09 ENCOUNTER — Other Ambulatory Visit: Payer: Self-pay

## 2021-02-09 ENCOUNTER — Ambulatory Visit (INDEPENDENT_AMBULATORY_CARE_PROVIDER_SITE_OTHER): Payer: BC Managed Care – PPO | Admitting: Nurse Practitioner

## 2021-02-09 VITALS — BP 157/71 | HR 71 | Temp 97.8°F | Ht 64.0 in | Wt 217.4 lb

## 2021-02-09 DIAGNOSIS — E782 Mixed hyperlipidemia: Secondary | ICD-10-CM

## 2021-02-09 DIAGNOSIS — E118 Type 2 diabetes mellitus with unspecified complications: Secondary | ICD-10-CM

## 2021-02-09 DIAGNOSIS — C73 Malignant neoplasm of thyroid gland: Secondary | ICD-10-CM

## 2021-02-09 DIAGNOSIS — I10 Essential (primary) hypertension: Secondary | ICD-10-CM

## 2021-02-09 DIAGNOSIS — F331 Major depressive disorder, recurrent, moderate: Secondary | ICD-10-CM | POA: Insufficient documentation

## 2021-02-09 DIAGNOSIS — L659 Nonscarring hair loss, unspecified: Secondary | ICD-10-CM

## 2021-02-09 LAB — POCT GLYCOSYLATED HEMOGLOBIN (HGB A1C)
HbA1c POC (<> result, manual entry): 6.8 % (ref 4.0–5.6)
HbA1c, POC (controlled diabetic range): 6.8 % (ref 0.0–7.0)
HbA1c, POC (prediabetic range): 6.8 % — AB (ref 5.7–6.4)
Hemoglobin A1C: 6.8 % — AB (ref 4.0–5.6)

## 2021-02-09 NOTE — Patient Instructions (Signed)
Managing Your Hypertension Hypertension, also called high blood pressure, is when the force of the blood pressing against the walls of the arteries is too strong. Arteries are blood vessels that carry blood from your heart throughout your body. Hypertension forces the heart to work harder to pump blood and may cause the arteries to become narrow or stiff. Understanding blood pressure readings Your personal target blood pressure may vary depending on your medical conditions, your age, and other factors. A blood pressure reading includes a higher number over a lower number. Ideally, your blood pressure should be below 120/80. You should know that: The first, or top, number is called the systolic pressure. It is a measure of the pressure in your arteries as your heart beats. The second, or bottom number, is called the diastolic pressure. It is a measure of the pressure in your arteries as the heart relaxes. Blood pressure is classified into four stages. Based on your blood pressure reading, your health care provider may use the following stages to determine what type of treatment you need, if any. Systolic pressure and diastolic pressure are measured in a unit called mmHg. Normal Systolic pressure: below 120. Diastolic pressure: below 80. Elevated Systolic pressure: 120-129. Diastolic pressure: below 80. Hypertension stage 1 Systolic pressure: 130-139. Diastolic pressure: 80-89. Hypertension stage 2 Systolic pressure: 140 or above. Diastolic pressure: 90 or above. How can this condition affect me? Managing your hypertension is an important responsibility. Over time, hypertension can damage the arteries and decrease blood flow to important parts of the body, including the brain, heart, and kidneys. Having untreated or uncontrolled hypertension can lead to: A heart attack. A stroke. A weakened blood vessel (aneurysm). Heart failure. Kidney damage. Eye damage. Metabolic syndrome. Memory and  concentration problems. Vascular dementia. What actions can I take to manage this condition? Hypertension can be managed by making lifestyle changes and possibly by taking medicines. Your health care provider will help you make a plan to bring your blood pressure within a normal range. Nutrition  Eat a diet that is high in fiber and potassium, and low in salt (sodium), added sugar, and fat. An example eating plan is called the Dietary Approaches to Stop Hypertension (DASH) diet. To eat this way: Eat plenty of fresh fruits and vegetables. Try to fill one-half of your plate at each meal with fruits and vegetables. Eat whole grains, such as whole-wheat pasta, brown rice, or whole-grain bread. Fill about one-fourth of your plate with whole grains. Eat low-fat dairy products. Avoid fatty cuts of meat, processed or cured meats, and poultry with skin. Fill about one-fourth of your plate with lean proteins such as fish, chicken without skin, beans, eggs, and tofu. Avoid pre-made and processed foods. These tend to be higher in sodium, added sugar, and fat. Reduce your daily sodium intake. Most people with hypertension should eat less than 1,500 mg of sodium a day. Lifestyle  Work with your health care provider to maintain a healthy body weight or to lose weight. Ask what an ideal weight is for you. Get at least 30 minutes of exercise that causes your heart to beat faster (aerobic exercise) most days of the week. Activities may include walking, swimming, or biking. Include exercise to strengthen your muscles (resistance exercise), such as weight lifting, as part of your weekly exercise routine. Try to do these types of exercises for 30 minutes at least 3 days a week. Do not use any products that contain nicotine or tobacco, such as cigarettes, e-cigarettes,   and chewing tobacco. If you need help quitting, ask your health care provider. Control any long-term (chronic) conditions you have, such as high  cholesterol or diabetes. Identify your sources of stress and find ways to manage stress. This may include meditation, deep breathing, or making time for fun activities. Alcohol use Do not drink alcohol if: Your health care provider tells you not to drink. You are pregnant, may be pregnant, or are planning to become pregnant. If you drink alcohol: Limit how much you use to: 0-1 drink a day for women. 0-2 drinks a day for men. Be aware of how much alcohol is in your drink. In the [place] one drink equals one 12 oz bottle of beer (355 mL), one 5 oz glass of wine (148 mL), or one 1 oz glass of hard liquor (44 mL). Medicines Your health care provider may prescribe medicine if lifestyle changes are not enough to get your blood pressure under control and if: Your systolic blood pressure is 130 or higher. Your diastolic blood pressure is 80 or higher. Take medicines only as told by your health care provider. Follow the directions carefully. Blood pressure medicines must be taken as told by your health care provider. The medicine does not work as well when you skip doses. Skipping doses also puts you at risk for problems. Monitoring Before you monitor your blood pressure: Do not smoke, drink caffeinated beverages, or exercise within 30 minutes before taking a measurement. Use the bathroom and empty your bladder (urinate). Sit quietly for at least 5 minutes before taking measurements. Monitor your blood pressure at home as told by your health care provider. To do this: Sit with your back straight and supported. Place your feet flat on the floor. Do not cross your legs. Support your arm on a flat surface, such as a table. Make sure your upper arm is at heart level. Each time you measure, take two or three readings one minute apart and record the results. You may also need to have your blood pressure checked regularly by your health care provider. General information Talk with your health care  provider about your diet, exercise habits, and other lifestyle factors that may be contributing to hypertension. Review all the medicines you take with your health care provider because there may be side effects or interactions. Keep all visits as told by your health care provider. Your health care provider can help you create and adjust your plan for managing your high blood pressure. Where to find more information National Heart, Lung, and Blood Institute: www.nhlbi.nih.gov American Heart Association: www.heart.org Contact a health care provider if: You think you are having a reaction to medicines you have taken. You have repeated (recurrent) headaches. You feel dizzy. You have swelling in your ankles. You have trouble with your vision. Get help right away if: You develop a severe headache or confusion. You have unusual weakness or numbness, or you feel faint. You have severe pain in your chest or abdomen. You vomit repeatedly. You have trouble breathing. These symptoms may represent a serious problem that is an emergency. Do not wait to see if the symptoms will go away. Get medical help right away. Call your local emergency services (911 in the [place] Do not drive yourself to the hospital. Summary Hypertension is when the force of blood pumping through your arteries is too strong. If this condition is not controlled, it may put you at risk for serious complications. Your personal target blood pressure may vary depending on   your medical conditions, your age, and other factors. For most people, a normal blood pressure is less than 120/80. Hypertension is managed by lifestyle changes, medicines, or both. Lifestyle changes to help manage hypertension include losing weight, eating a healthy, low-sodium diet, exercising more, stopping smoking, and limiting alcohol. This information is not intended to replace advice given to you by your health care provider. Make sure you discuss any questions  you have with your health care provider. Document Revised: 01/15/2019 Document Reviewed: 11/28/2018 Elsevier Patient Education  2022 [employer]  

## 2021-02-09 NOTE — Progress Notes (Signed)
[place], [place]  [address] Phone:  [phone]   Fax:  [phone]   Established Patient Office Visit  Subjective:  Patient ID: Cynthia Patton, female    DOB: 1960-12-17  Age: 61 y.o. MRN: [id number]  CC:  Chief Complaint  Patient presents with   Follow-up    Pt has no issues or concerns    HPI Cynthia Patton presents for follow up. She  has a past medical history of Acute low back pain (03/25/2020), Anxiety, Cancer ([place]) (2000?), Depression, Diabetes mellitus without complication ([place]), Hyperlipidemia, Hypertension, and Thyroid disease.   She was seen by endocrinology on last week. She reports her CBG was 200. She reports that her A1c was elevated. She reports that this was due to her not to being on her Ozempic. She reports this was related to pharmacy supply and demand.   "She feels like when her number  are like this is why her  blood pressure is evaluated".   She continues to deal with depression. She is has been eating more. She feels like the Celexa 20 mg is effective. She would like to Ozempic in her system before making any changes. She was able to celebrate her Rudene Anda and this went well.    Past Medical History:  Diagnosis Date   Acute low back pain 03/25/2020   Anxiety    Cancer ([place]) 2000?   thyroid cancer   Depression    Diabetes mellitus without complication ([place])    Hyperlipidemia    Hypertension    Thyroid disease     Past Surgical History:  Procedure Laterality Date   ABDOMINAL HYSTERECTOMY     BREAST CYST EXCISION Left    CHOLECYSTECTOMY     INCISE AND DRAIN ABCESS     on right leg    THYROIDECTOMY      Family History  Problem Relation Age of Onset   Kidney disease Mother    Hypertension Mother    Hypertension Sister    Heart disease Brother    Colon cancer Neg Hx    Colon polyps Neg Hx    Esophageal cancer Neg Hx    Rectal cancer Neg Hx    Stomach cancer Neg Hx     Social History    Socioeconomic History   Marital status: Single    Spouse name: Not on file   Number of children: Not on file   Years of education: Not on file   Highest education level: Not on file  Occupational History   Not on file  Tobacco Use   Smoking status: Every Day    Packs/day: 0.50    Types: Cigarettes   Smokeless tobacco: Never  Vaping Use   Vaping Use: Never used  Substance and Sexual Activity   Alcohol use: No   Drug use: No   Sexual activity: Not Currently  Other Topics Concern   Not on file  Social History Narrative   Not on file   Social Determinants of Health   Financial Resource Strain: Not on file  Food Insecurity: Not on file  Transportation Needs: Not on file  Physical Activity: Not on file  Stress: Not on file  Social Connections: Not on file  Intimate Partner Violence: Not on file    Outpatient Medications Prior to Visit  Medication Sig Dispense Refill   albuterol (PROVENTIL) (2.5 MG/3ML) 0.083% nebulizer solution Take 3 mLs (2.5 mg total) by nebulization every 4 (  four) hours as needed for wheezing or shortness of breath. 75 mL 2   blood glucose meter kit and supplies KIT Dispense based on patient and insurance preference. Use up to four times daily as directed. (FOR ICD-9 250.00, 250.01). 1 each 0   citalopram (CELEXA) 20 MG tablet Take 1 tablet (20 mg total) by mouth daily. 90 tablet 3   diltiazem (DILT-XR) 180 MG 24 hr capsule Take 1 capsule (180 mg total) by mouth daily. 90 capsule 3   ibuprofen (ADVIL) 800 MG tablet Take 1 tablet (800 mg total) by mouth every 8 (eight) hours as needed. 30 tablet 0   levalbuterol (XOPENEX HFA) 45 MCG/ACT inhaler Inhale 1 puff into the lungs every 4 (four) hours as needed for wheezing. 1 each 2   levothyroxine (SYNTHROID) 175 MCG tablet Take by mouth.     losartan (COZAAR) 50 MG tablet Take 50 mg by mouth daily.     mometasone (NASONEX) 50 MCG/ACT nasal spray Place 2 sprays into the nose daily. 17 g 1   ONETOUCH VERIO test  strip USE UP TO 4 TIMES DAILY AS DIRECTED     gabapentin (NEURONTIN) 100 MG capsule Take 1 capsule (100 mg total) by mouth 2 (two) times daily. (Patient not taking: Reported on 10/03/2020) 60 capsule 1   Semaglutide, 1 MG/DOSE, 2 MG/1.5ML SOPN Inject into the skin. (Patient not taking: Reported on 11/07/2020)     No facility-administered medications prior to visit.    Allergies  Allergen Reactions   Floxin [Ocuflox] Other (See Comments)    Disoriented   Ofloxacin Other (See Comments)    Disoriented   Percocet [Oxycodone-Acetaminophen]     ROS Review of Systems    Objective:    Physical Exam Constitutional:      Appearance: She is obese.  HENT:     Head: Normocephalic and atraumatic.     Nose: Nose normal.     Mouth/Throat:     Mouth: Mucous membranes are moist.  Cardiovascular:     Rate and Rhythm: Normal rate and regular rhythm.     Pulses: Normal pulses.     Heart sounds: Normal heart sounds.  Pulmonary:     Effort: Pulmonary effort is normal.     Comments: hypoactive Abdominal:     Palpations: Abdomen is soft.     Comments: hypoactive  Musculoskeletal:     Cervical back: Normal range of motion.     Right lower leg: No edema.     Left lower leg: No edema.  Skin:    General: Skin is warm and dry.     Capillary Refill: Capillary refill takes less than 2 seconds.  Neurological:     General: No focal deficit present.     Mental Status: She is alert and oriented to person, place, and time.  Psychiatric:        Behavior: Behavior normal.        Thought Content: Thought content normal.        Judgment: Judgment normal.    BP (!) 157/71    Pulse 71    Temp 97.8 F (36.6 C)    Ht _0  (1.626 m)    Wt 217 lb 6 oz (98.6 kg)    SpO2 98%    BMI 37.31 kg/m  Wt Readings from Last 3 Encounters:  02/09/21 217 lb 6 oz (98.6 kg)  11/07/20 206 lb 6.4 oz (93.6 kg)  10/03/20 204 lb 12.8 oz (92.9 kg)  Health Maintenance Due  Topic Date Due   PAP SMEAR-Modifier  Never  done   OPHTHALMOLOGY EXAM  01/30/2021    There are no preventive care reminders to display for this patient.  Lab Results  Component Value Date   TSH 14.280 (H) 07/13/2018   Lab Results  Component Value Date   WBC 9.8 06/24/2020   HGB 12.6 06/24/2020   HCT 38.5 06/24/2020   MCV 92 06/24/2020   PLT 363 06/24/2020   Lab Results  Component Value Date   NA 137 04/10/2020   K 3.6 04/10/2020   CO2 27 04/10/2020   GLUCOSE 107 (H) 04/10/2020   BUN 14 04/10/2020   CREATININE 0.93 04/10/2020   BILITOT 0.4 07/27/2019   ALKPHOS 94 07/27/2019   AST 10 07/27/2019   ALT 13 02/12/2019   PROT 7.2 07/27/2019   ALBUMIN 4.3 07/27/2019   CALCIUM 9.1 04/10/2020   ANIONGAP 6 04/10/2020   Lab Results  Component Value Date   CHOL 162 02/08/2020   Lab Results  Component Value Date   HDL 44 02/08/2020   Lab Results  Component Value Date   LDLCALC 100 (H) 02/08/2020   Lab Results  Component Value Date   TRIG 97 02/08/2020   Lab Results  Component Value Date   CHOLHDL 3.7 02/08/2020   Lab Results  Component Value Date   HGBA1C 6.8 (A) 02/09/2021   HGBA1C 6.8 02/09/2021   HGBA1C 6.8 (A) 02/09/2021   HGBA1C 6.8 02/09/2021      Assessment & Plan:   Problem List Items Addressed This Visit       Cardiovascular and Mediastinum   Essential hypertension - Primary Encouraged on going compliance with current medication regimen Encouraged home monitoring and recording BP <130/80 Eating a heart-healthy diet with less salt Encouraged regular physical activity  Recommend Weight loss    Relevant Orders   Comp. Metabolic Panel (12)     Endocrine   Controlled type 2 diabetes mellitus with complication, without long-term current use of insulin (HCC) Continue to follow up with endocrinology    Relevant Orders   HgB A1c (Completed)   Thyroid cancer ([place]) Continue to follow up with endocrinology      Other   Hyperlipidemia Encouraged healthy eating and exercise   Relevant  Orders   Lipid panel   Moderate episode of recurrent major depressive disorder (HCC) Discussed Celexa 20 mg may increase   Discussed counseling      No orders of the defined types were placed in this encounter.   Follow-up: Return in about 6 months (around 08/09/2021) for Follow up HTN [id number].    Vevelyn Francois, NP

## 2021-02-10 ENCOUNTER — Encounter: Payer: Self-pay | Admitting: Nurse Practitioner

## 2021-02-10 LAB — LIPID PANEL
Chol/HDL Ratio: 3.8 ratio (ref 0.0–4.4)
Cholesterol, Total: 179 mg/dL (ref 100–199)
HDL: 47 mg/dL (ref >39)
LDL Chol Calc (NIH): 110 mg/dL — ABNORMAL HIGH (ref 0–99)
Triglycerides: 121 mg/dL (ref 0–149)
VLDL Cholesterol Cal: 22 mg/dL (ref 5–40)

## 2021-02-10 LAB — COMP. METABOLIC PANEL (12)
AST: 10 IU/L (ref 0–40)
Albumin/Globulin Ratio: 2 (ref 1.2–2.2)
Albumin: 4.4 g/dL (ref 3.8–4.8)
Alkaline Phosphatase: 104 IU/L (ref 44–121)
BUN/Creatinine Ratio: 15 (ref 12–28)
BUN: 14 mg/dL (ref 8–27)
Bilirubin Total: <0.2 mg/dL (ref 0.0–1.2)
Calcium: 9.3 mg/dL (ref 8.7–10.3)
Chloride: 106 mmol/L (ref 96–106)
Creatinine, Ser: 0.91 mg/dL (ref 0.57–1.00)
Globulin, Total: 2.2 g/dL (ref 1.5–4.5)
Glucose: 100 mg/dL — ABNORMAL HIGH (ref 70–99)
Potassium: 4 mmol/L (ref 3.5–5.2)
Sodium: 143 mmol/L (ref 134–144)
Total Protein: 6.6 g/dL (ref 6.0–8.5)
eGFR: 71 mL/min/{1.73_m2} (ref >59)

## 2021-03-23 DIAGNOSIS — M25372 Other instability, left ankle: Secondary | ICD-10-CM | POA: Diagnosis not present

## 2021-03-23 DIAGNOSIS — M722 Plantar fascial fibromatosis: Secondary | ICD-10-CM | POA: Diagnosis not present

## 2021-03-23 DIAGNOSIS — M792 Neuralgia and neuritis, unspecified: Secondary | ICD-10-CM | POA: Diagnosis not present

## 2021-04-09 ENCOUNTER — Ambulatory Visit: Payer: BC Managed Care – PPO | Admitting: Nurse Practitioner

## 2021-04-09 ENCOUNTER — Encounter: Payer: Self-pay | Admitting: Nurse Practitioner

## 2021-04-09 VITALS — BP 161/65 | HR 76 | Temp 97.0°F | Ht 64.0 in | Wt 220.0 lb

## 2021-04-09 DIAGNOSIS — H9313 Tinnitus, bilateral: Secondary | ICD-10-CM | POA: Diagnosis not present

## 2021-04-09 MED ORDER — CEFDINIR 300 MG PO CAPS: 300.0000 mg | ORAL_CAPSULE | Freq: Two times a day (BID) | ORAL | 0 refills | Status: AC

## 2021-04-09 MED ORDER — FLUTICASONE PROPIONATE 50 MCG/ACT NA SUSP: 2.0000 | Freq: Every day | NASAL | 6 refills | Status: AC

## 2021-04-09 NOTE — Patient Instructions (Addendum)
1. Tinnitus of both ears ? ?- Ambulatory referral to ENT ? ?- considering symptoms will order: ? ?- fluticasone (FLONASE) 50 MCG/ACT nasal spray; Place 2 sprays into both nostrils daily.  Dispense: 16 g; Refill: 6 ?- cefdinir (OMNICEF) 300 MG capsule; Take 1 capsule (300 mg total) by mouth 2 (two) times daily for 10 days.  Dispense: 20 capsule; Refill: 0 ? ?May continue allegra ? ?Follow up: ? ?Follow up as needed ? ?

## 2021-04-09 NOTE — Assessment & Plan Note (Signed)
-   Ambulatory referral to ENT ? ?- considering symptoms will order: ? ?- fluticasone (FLONASE) 50 MCG/ACT nasal spray; Place 2 sprays into both nostrils daily.  Dispense: 16 g; Refill: 6 ?- cefdinir (OMNICEF) 300 MG capsule; Take 1 capsule (300 mg total) by mouth 2 (two) times daily for 10 days.  Dispense: 20 capsule; Refill: 0 ? ?May continue allegra ? ?Follow up: ? ?Follow up as needed ?

## 2021-04-09 NOTE — Progress Notes (Signed)
_0  ID: Cynthia Patton, female    DOB: 12/18/1960, 61 y.o.   MRN: [id number] ? ?Chief Complaint  ?Patient presents with  ? OFFICE VISIT  ?  Pt stated both ears hurts and ringing it started today.  ? ? ?Referring provider: ?Vevelyn Francois, NP ? ?61 year old female with past medical history of Acute low back pain (03/25/2020), Anxiety, Cancer ([place]) (2000?), Depression, Diabetes mellitus without complication ([place]), Hyperlipidemia, Hypertension, and Thyroid disease.  ? ?HPI ? ?Patient presents today for ringing and pain to both the ears.  She states that this started about a month ago and is progressively worsening.  She states that she does have associated dizziness at times.  She denies any significant fever or drainage.  She denies any hearing loss.Denies f/c/s, n/v/d, hemoptysis, PND, chest pain or edema. ? ? ? ?Allergies  ?Allergen Reactions  ? Floxin [Ocuflox] Other (See Comments)  ?  Disoriented  ? Ofloxacin Other (See Comments)  ?  Disoriented  ? Percocet [Oxycodone-Acetaminophen]   ? ? ?Immunization History  ?Administered Date(s) Administered  ? PFIZER(Purple Top)SARS-COV-2 Vaccination 03/25/2019, 04/16/2019, 01/26/2020  ? Pneumococcal Polysaccharide-23 03/19/2016  ? Tdap 02/09/2016  ? ? ?Past Medical History:  ?Diagnosis Date  ? Acute low back pain 03/25/2020  ? Anxiety   ? Cancer ([place]) 2000?  ? thyroid cancer  ? Depression   ? Diabetes mellitus without complication ([place])   ? Hyperlipidemia   ? Hypertension   ? Thyroid disease   ? ? ?Tobacco History: ?Social History  ? ?Tobacco Use  ?Smoking Status Every Day  ? Packs/day: 0.50  ? Types: Cigarettes  ?Smokeless Tobacco Never  ? ?Ready to quit: Not Answered ?Counseling given: Not Answered ? ? ?Outpatient Encounter Medications as of 04/09/2021  ?Medication Sig  ? blood glucose meter kit and supplies KIT Dispense based on patient and insurance preference. Use up to four times daily as directed. (FOR ICD-9 250.00, 250.01).  ? cefdinir (OMNICEF) 300 MG capsule Take  1 capsule (300 mg total) by mouth 2 (two) times daily for 10 days.  ? citalopram (CELEXA) 20 MG tablet Take 1 tablet (20 mg total) by mouth daily.  ? diltiazem (DILT-XR) 180 MG 24 hr capsule Take 1 capsule (180 mg total) by mouth daily.  ? fluticasone (FLONASE) 50 MCG/ACT nasal spray Place 2 sprays into both nostrils daily.  ? gabapentin (NEURONTIN) 100 MG capsule Take 1 capsule (100 mg total) by mouth 2 (two) times daily.  ? ibuprofen (ADVIL) 800 MG tablet Take 1 tablet (800 mg total) by mouth every 8 (eight) hours as needed.  ? levalbuterol (XOPENEX HFA) 45 MCG/ACT inhaler Inhale 1 puff into the lungs every 4 (four) hours as needed for wheezing.  ? levothyroxine (SYNTHROID) 175 MCG tablet Take by mouth.  ? losartan (COZAAR) 50 MG tablet Take 50 mg by mouth daily.  ? mometasone (NASONEX) 50 MCG/ACT nasal spray Place 2 sprays into the nose daily.  ? ONETOUCH VERIO test strip USE UP TO 4 TIMES DAILY AS DIRECTED  ? albuterol (PROVENTIL) (2.5 MG/3ML) 0.083% nebulizer solution Take 3 mLs (2.5 mg total) by nebulization every 4 (four) hours as needed for wheezing or shortness of breath.  ? Semaglutide, 1 MG/DOSE, 2 MG/1.5ML SOPN Inject into the skin. (Patient not taking: Reported on 04/09/2021)  ? ?No facility-administered encounter medications on file as of 04/09/2021.  ? ? ? ?Review of Systems ? ?Review of Systems  ?Constitutional: Negative.   ?HENT:  Positive for ear pain and  tinnitus.   ?Cardiovascular: Negative.   ?Gastrointestinal: Negative.   ?Allergic/Immunologic: Negative.   ?Neurological: Negative.   ?Psychiatric/Behavioral: Negative.     ? ? ? ?Physical Exam ? ?BP (!) 161/65 (BP Location: Right Arm, Patient Position: Sitting, Cuff Size: Large)   Pulse 76   Temp (!) 97 ?F (36.1 ?C)   Ht _0  (1.626 m)   Wt 220 lb 0.4 oz (99.8 kg)   SpO2 100%   BMI 37.77 kg/m?  ? ?Wt Readings from Last 5 Encounters:  ?04/09/21 220 lb 0.4 oz (99.8 kg)  ?02/09/21 217 lb 6 oz (98.6 kg)  ?11/07/20 206 lb 6.4 oz (93.6 kg)   ?10/03/20 204 lb 12.8 oz (92.9 kg)  ?08/07/20 203 lb (92.1 kg)  ? ? ? ?Physical Exam ?Vitals and nursing note reviewed.  ?Constitutional:   ?   General: She is not in acute distress. ?   Appearance: She is well-developed.  ?HENT:  ?   Right Ear: Tympanic membrane, ear canal and external ear normal. There is no impacted cerumen.  ?   Left Ear: Tympanic membrane, ear canal and external ear normal. There is no impacted cerumen.  ?Cardiovascular:  ?   Rate and Rhythm: Normal rate and regular rhythm.  ?Pulmonary:  ?   Effort: Pulmonary effort is normal.  ?   Breath sounds: Normal breath sounds.  ?Neurological:  ?   Mental Status: She is alert and oriented to person, place, and time.  ? ? ? ?Lab Results: ? ?CBC ?   ?Component Value Date/Time  ? WBC 9.8 06/24/2020 1421  ? WBC 5.8 04/10/2020 1130  ? RBC 4.19 06/24/2020 1421  ? RBC 4.52 04/10/2020 1130  ? HGB 12.6 06/24/2020 1421  ? HCT 38.5 06/24/2020 1421  ? PLT 363 06/24/2020 1421  ? MCV 92 06/24/2020 1421  ? MCH 30.1 06/24/2020 1421  ? MCH 30.3 04/10/2020 1130  ? MCHC 32.7 06/24/2020 1421  ? MCHC 31.9 04/10/2020 1130  ? RDW 12.5 06/24/2020 1421  ? LYMPHSABS 2.4 06/24/2020 1421  ? MONOABS 0.4 04/10/2020 1130  ? EOSABS 0.1 06/24/2020 1421  ? BASOSABS 0.0 06/24/2020 1421  ? ? ?BMET ?   ?Component Value Date/Time  ? NA 143 02/09/2021 1139  ? K 4.0 02/09/2021 1139  ? CL 106 02/09/2021 1139  ? CO2 27 04/10/2020 1130  ? GLUCOSE 100 (H) 02/09/2021 1139  ? GLUCOSE 107 (H) 04/10/2020 1130  ? BUN 14 02/09/2021 1139  ? CREATININE 0.91 02/09/2021 1139  ? CREATININE 0.90 10/14/2016 0844  ? CALCIUM 9.3 02/09/2021 1139  ? GFRNONAA >60 04/10/2020 1130  ? GFRNONAA 71 10/14/2016 0844  ? GFRAA 81 07/27/2019 1111  ? GFRAA 82 10/14/2016 0844  ? ? ?BNP ?No results found for: BNP ? ?ProBNP ?   ?Component Value Date/Time  ? PROBNP 41.1 04/20/2013 1436  ? ? ?Imaging: ?No results found. ? ? ?Assessment & Plan:  ? ?Tinnitus of both ears ?- Ambulatory referral to ENT ? ?- considering symptoms will  order: ? ?- fluticasone (FLONASE) 50 MCG/ACT nasal spray; Place 2 sprays into both nostrils daily.  Dispense: 16 g; Refill: 6 ?- cefdinir (OMNICEF) 300 MG capsule; Take 1 capsule (300 mg total) by mouth 2 (two) times daily for 10 days.  Dispense: 20 capsule; Refill: 0 ? ?May continue allegra ? ?Follow up: ? ?Follow up as needed ? ? ? ? ?Fenton Foy, NP ?04/09/2021 ? ?

## 2021-04-15 DIAGNOSIS — M792 Neuralgia and neuritis, unspecified: Secondary | ICD-10-CM | POA: Diagnosis not present

## 2021-04-28 DIAGNOSIS — M722 Plantar fascial fibromatosis: Secondary | ICD-10-CM | POA: Diagnosis not present

## 2021-04-30 DIAGNOSIS — N281 Cyst of kidney, acquired: Secondary | ICD-10-CM | POA: Diagnosis not present

## 2021-05-11 ENCOUNTER — Telehealth: Payer: Self-pay | Admitting: Nurse Practitioner

## 2021-05-11 NOTE — Telephone Encounter (Signed)
Levothyroxine refill request.

## 2021-05-15 NOTE — Telephone Encounter (Signed)
Looks like this patient is followed by endocrinology for thyroid. ?

## 2021-05-21 ENCOUNTER — Emergency Department (HOSPITAL_COMMUNITY)
Admission: EM | Admit: 2021-05-21 | Discharge: 2021-05-21 | Disposition: A | Discharge status: Home / Self Care | Payer: BC Managed Care – PPO | Attending: Emergency Medicine | Admitting: Emergency Medicine

## 2021-05-21 ENCOUNTER — Other Ambulatory Visit: Payer: Self-pay

## 2021-05-21 ENCOUNTER — Encounter (HOSPITAL_COMMUNITY): Payer: Self-pay | Admitting: Emergency Medicine

## 2021-05-21 DIAGNOSIS — R110 Nausea: Secondary | ICD-10-CM | POA: Diagnosis not present

## 2021-05-21 DIAGNOSIS — R42 Dizziness and giddiness: Secondary | ICD-10-CM | POA: Insufficient documentation

## 2021-05-21 DIAGNOSIS — R7309 Other abnormal glucose: Secondary | ICD-10-CM | POA: Insufficient documentation

## 2021-05-21 LAB — CBC
HCT: 41.8 % (ref 36.0–46.0)
Hemoglobin: 13.7 g/dL (ref 12.0–15.0)
MCH: 30.5 pg (ref 26.0–34.0)
MCHC: 32.8 g/dL (ref 30.0–36.0)
MCV: 93.1 fL (ref 80.0–100.0)
Platelets: 322 10*3/uL (ref 150–400)
RBC: 4.49 MIL/uL (ref 3.87–5.11)
RDW: 12.8 % (ref 11.5–15.5)
WBC: 7.1 10*3/uL (ref 4.0–10.5)
nRBC: 0 % (ref 0.0–0.2)

## 2021-05-21 LAB — BASIC METABOLIC PANEL
Anion gap: 8 (ref 5–15)
BUN: 9 mg/dL (ref 8–23)
CO2: 24 mmol/L (ref 22–32)
Calcium: 8.8 mg/dL — ABNORMAL LOW (ref 8.9–10.3)
Chloride: 108 mmol/L (ref 98–111)
Creatinine, Ser: 0.84 mg/dL (ref 0.44–1.00)
GFR, Estimated: >60 mL/min (ref >60)
Glucose, Bld: 122 mg/dL — ABNORMAL HIGH (ref 70–99)
Potassium: 3.9 mmol/L (ref 3.5–5.1)
Sodium: 140 mmol/L (ref 135–145)

## 2021-05-21 LAB — TROPONIN I (HIGH SENSITIVITY): Troponin I (High Sensitivity): 5 ng/L (ref <18)

## 2021-05-21 LAB — TSH: TSH: 2.089 u[IU]/mL (ref 0.350–4.500)

## 2021-05-21 MED ORDER — MECLIZINE HCL 25 MG PO TABS
25.0000 mg | ORAL_TABLET | Freq: Once | ORAL | Status: AC
  Administered 2021-05-21: 25 mg via ORAL
  Filled 2021-05-21: qty 1

## 2021-05-21 MED ORDER — MECLIZINE HCL 25 MG PO TABS: 25.0000 mg | ORAL_TABLET | Freq: Two times a day (BID) | ORAL | 0 refills | Status: DC | PRN

## 2021-05-21 MED ORDER — SODIUM CHLORIDE 0.9 % IV BOLUS
500.0000 mL | Freq: Once | INTRAVENOUS | Status: AC
  Administered 2021-05-21: 500 mL via INTRAVENOUS

## 2021-05-21 NOTE — Discharge Instructions (Addendum)
Return to the ED with any new symptom such as numbness, weakness, chest pain or shortness of breath ?Please follow-up with the PCP I referred you to.  You will need to call to make an appointment to be seen. ?Please read the attached informational guide concerning dizziness ?Please pick up your meclizine prescription.  You will only take this as needed when you experience dizzy spells ?Please continue to hydrate yourself as needed ?

## 2021-05-21 NOTE — ED Triage Notes (Signed)
Pt to EMS from work with reports of dizziness and nausea.  Pt state the dizziness does change with position change.  Pt denies other c/o at this time. ?

## 2021-05-21 NOTE — ED Notes (Signed)
Pt was able to ambulate to the restroom.  

## 2021-05-21 NOTE — ED Provider Notes (Signed)
?[facility] ?Provider Note ? ? ?CSN: [id number] ?Arrival date & time: 05/21/21  1032 ? ?  ? ?History ? ?Chief Complaint  ?Patient presents with  ? Dizziness  ? Nausea  ? ? ?Cynthia Patton is a 61 y.o. female ? ?Patient presents ED via EMS for evaluation of dizziness and nausea.  Patient reports that she came down for break, sat in car, became lightheaded/dizzy described as "floating" out of nowhere. Felt like brain fog, felt ill all of a sudden. At that point, called 911 and then became SOB. Fire arrived, took cbg, patient states breathing then became worse, she was gasping for air. When patient was asked to stand and transition to stretcher, she states she blacked out/lost conciousness momentarily. States hx of htn and dm. Denies cardiac history. No pulmonology history. No cp, no vomiting. Patient reports on Monday she had similar symptoms as today just not as bad, had left arm pain at this time. No ankle or lower extremity swelling. Patient states vision is blurry while looking at her phone however no blurred vision currently. No headache. States on Tuesday she had an episode of nagging pain between shoulder blades that subsided on its own. 2 months ago patient starts she was at work and ears began ringing at the same time, was painful. Patient reports she had a much more mild episode of dizziness two months ago however it resolved on its own ? ? ?Dizziness ?Associated symptoms: nausea   ?Associated symptoms: no chest pain, no headaches, no shortness of breath and no vomiting   ? ?  ? ?Home Medications ?Prior to Admission medications   ?Medication Sig Start Date End Date Taking? Authorizing Provider  ?meclizine (ANTIVERT) 25 MG tablet Take 1 tablet (25 mg total) by mouth 2 (two) times daily as needed for dizziness. 05/21/21  Yes Azucena Cecil, PA-C  ?albuterol (PROVENTIL) (2.5 MG/3ML) 0.083% nebulizer solution Take 3 mLs (2.5 mg total) by nebulization every 4 (four)  hours as needed for wheezing or shortness of breath. 02/21/20 02/20/21  Vevelyn Francois, NP  ?blood glucose meter kit and supplies KIT Dispense based on patient and insurance preference. Use up to four times daily as directed. (FOR ICD-9 250.00, 250.01). 07/07/18   Lanae Boast, FNP  ?citalopram (CELEXA) 20 MG tablet Take 1 tablet (20 mg total) by mouth daily. 11/07/20 11/07/21  Vevelyn Francois, NP  ?diltiazem (DILT-XR) 180 MG 24 hr capsule Take 1 capsule (180 mg total) by mouth daily. 11/24/20 11/24/21  Vevelyn Francois, NP  ?fluticasone (FLONASE) 50 MCG/ACT nasal spray Place 2 sprays into both nostrils daily. 04/09/21   Fenton Foy, NP  ?gabapentin (NEURONTIN) 100 MG capsule Take 1 capsule (100 mg total) by mouth 2 (two) times daily. 03/25/20   Azzie Glatter, FNP  ?ibuprofen (ADVIL) 800 MG tablet Take 1 tablet (800 mg total) by mouth every 8 (eight) hours as needed. 11/07/20   Vevelyn Francois, NP  ?levalbuterol [facility] HFA) 45 MCG/ACT inhaler Inhale 1 puff into the lungs every 4 (four) hours as needed for wheezing. 10/03/20 10/03/21  Vevelyn Francois, NP  ?levothyroxine (SYNTHROID) 175 MCG tablet Take by mouth. 01/14/20   [provider]  ?losartan (COZAAR) 50 MG tablet Take 50 mg by mouth daily. 07/09/19   [provider]  ?mometasone (NASONEX) 50 MCG/ACT nasal spray Place 2 sprays into the nose daily. 10/14/16   Dorena Dew, FNP  ?ONETOUCH VERIO test strip USE UP TO  4 TIMES DAILY AS DIRECTED 10/22/18   [provider]  ?Semaglutide, 1 MG/DOSE, 2 MG/1.5ML SOPN Inject into the skin. ?Patient not taking: Reported on 04/09/2021 01/14/20   [provider]  ?   ? ?Allergies    ?Floxin [ocuflox], Ofloxacin, and Percocet [oxycodone-acetaminophen]   ? ?Review of Systems   ?Review of Systems  ?Eyes:  Negative for visual disturbance.  ?Respiratory:  Negative for shortness of breath.   ?Cardiovascular:  Negative for chest pain and leg swelling.  ?Gastrointestinal:  Positive for nausea.  Negative for vomiting.  ?Neurological:  Positive for dizziness, syncope and light-headedness. Negative for headaches.  ?All other systems reviewed and are negative. ? ?Physical Exam ?Updated Vital Signs ?BP (!) 161/68   Pulse 69   Temp 97.8 ?F (36.6 ?C) (Oral)   Resp 18   Ht _0  (1.6 m)   Wt 95.3 kg   SpO2 98%   BMI 37.20 kg/m?  ?Physical Exam ?Vitals and nursing note reviewed.  ?Constitutional:   ?   General: She is not in acute distress. ?   Appearance: Normal appearance. She is not ill-appearing, toxic-appearing or diaphoretic.  ?HENT:  ?   Head: Normocephalic and atraumatic.  ?   Right Ear: Tympanic membrane normal.  ?   Left Ear: Tympanic membrane normal.  ?   Nose: Nose normal. No congestion.  ?   Mouth/Throat:  ?   Mouth: Mucous membranes are moist.  ?   Pharynx: Oropharynx is clear.  ?Eyes:  ?   Extraocular Movements: Extraocular movements intact.  ?   Conjunctiva/sclera: Conjunctivae normal.  ?   Pupils: Pupils are equal, round, and reactive to light.  ?Cardiovascular:  ?   Rate and Rhythm: Normal rate and regular rhythm.  ?   Heart sounds: Normal heart sounds.  ?Pulmonary:  ?   Effort: Pulmonary effort is normal.  ?   Breath sounds: Normal breath sounds. No wheezing or rales.  ?Abdominal:  ?   General: Abdomen is flat. Bowel sounds are normal.  ?   Palpations: Abdomen is soft.  ?   Tenderness: There is no abdominal tenderness.  ?Musculoskeletal:  ?   Cervical back: Normal range of motion and neck supple. No tenderness.  ?   Right lower leg: No edema.  ?   Left lower leg: No edema.  ?Skin: ?   General: Skin is warm and dry.  ?   Capillary Refill: Capillary refill takes less than 2 seconds.  ?Neurological:  ?   General: No focal deficit present.  ?   Mental Status: She is alert and oriented to person, place, and time.  ?   GCS: GCS eye subscore is 4. GCS verbal subscore is 5. GCS motor subscore is 6.  ?   Cranial Nerves: Cranial nerves 2-12 are intact. No cranial nerve deficit.  ?   Sensory:  Sensation is intact. No sensory deficit.  ?   Motor: Motor function is intact. No weakness.  ?   Coordination: Coordination is intact. Heel to [facility] Test normal.  ? ? ?ED Results / Procedures / Treatments   ?Labs ?(all labs ordered are listed, but only abnormal results are displayed) ?Labs Reviewed  ?BASIC METABOLIC PANEL - Abnormal; Notable for the following components:  ?    Result Value  ? Glucose, Bld 122 (*)   ? Calcium 8.8 (*)   ? All other components within normal limits  ?CBC  ?TSH  ?TROPONIN I (HIGH SENSITIVITY)  ? ? ?EKG ?  None ? ?Radiology ?No results found. ? ?Procedures ?Procedures  ? ?Medications Ordered in ED ?Medications  ?meclizine (ANTIVERT) tablet 25 mg (25 mg Oral Given 05/21/21 1238)  ?sodium chloride 0.9 % bolus 500 mL (500 mLs Intravenous New Bag/Given 05/21/21 1239)  ? ? ?ED Course/ Medical Decision Making/ A&P ?  ?                        ?Medical Decision Making ?Amount and/or Complexity of Data Reviewed ?Labs: ordered. ? ? ?61 year old female presents ED for evaluation.  Please see HPI for further details. ? ?On examination, the patient is afebrile, nontachycardic.  The patient is nonhypoxic and has clear lung sounds bilaterally.  Patient abdomen is soft and compressible 4 quadrants.  The patient is alert and orient x4, her neurological examination shows no focal neurodeficits. ? ?Patient worked up utilizing following labs and imaging studies interpreted by me personally: ?- TSH unremarkable, within normal limits ?- Troponin 5, no active chest pain ?- CBC unremarkable ?- BMP elevated glucose 122, unremarkable and noncontributory to symptoms ?- EKG 12-lead sinus rhythm ? ?Patient reports after receiving 5 mg of meclizine she feels much better.  The patient also received a 500 mL bag of fluid.  Patient states at this time she is much better.  Patient denies any sensation of room spinning. ? ?At this time, the patient is stable for discharge home.  The patient will be sent home with prescription  for meclizine and referred to a PCP for further management.  The patient was given return precautions and she voiced understanding.  The patient had all of her questions answered to her satisfaction. ? ? ?Final Clinical

## 2021-06-30 ENCOUNTER — Encounter (HOSPITAL_BASED_OUTPATIENT_CLINIC_OR_DEPARTMENT_OTHER): Payer: Self-pay | Admitting: Family Medicine

## 2021-06-30 ENCOUNTER — Ambulatory Visit (HOSPITAL_BASED_OUTPATIENT_CLINIC_OR_DEPARTMENT_OTHER): Payer: BC Managed Care – PPO | Admitting: Family Medicine

## 2021-06-30 DIAGNOSIS — R21 Rash and other nonspecific skin eruption: Secondary | ICD-10-CM | POA: Diagnosis not present

## 2021-06-30 DIAGNOSIS — E118 Type 2 diabetes mellitus with unspecified complications: Secondary | ICD-10-CM | POA: Diagnosis not present

## 2021-06-30 DIAGNOSIS — E890 Postprocedural hypothyroidism: Secondary | ICD-10-CM | POA: Diagnosis not present

## 2021-06-30 DIAGNOSIS — I10 Essential (primary) hypertension: Secondary | ICD-10-CM | POA: Diagnosis not present

## 2021-06-30 MED ORDER — TRIAMCINOLONE ACETONIDE 0.1 % EX CREA: 1.0000 | TOPICAL_CREAM | Freq: Two times a day (BID) | CUTANEOUS | 0 refills | Status: DC

## 2021-06-30 NOTE — Assessment & Plan Note (Signed)
New issue for patient, primarily located over forearms bilaterally, left slightly worse than right.  Uncertain etiology, suspect possible eczematous type of rash and will treat initially with topical steroid.  She has tried low potency OTC steroid without notable relief.  We will proceed with medium potency steroid at this time.  If symptoms do not improve as expected or if worsening is noted, would consider transitioning to therapy such as topical antifungal

## 2021-06-30 NOTE — Assessment & Plan Note (Signed)
Blood pressure is borderline in office today, systolic is slightly elevated Recommend checking blood pressure at home in order to have a more complete picture of blood pressure management At this time can continue with current medications including losartan and diltiazem.  May need to consider adjustment pending progress with blood pressure readings Recommend DASH diet

## 2021-06-30 NOTE — Patient Instructions (Signed)
  Medication Instructions:  Your physician recommends that you continue on your current medications as directed. Please refer to the Current Medication list given to you today. --If you need a refill on any your medications before your next appointment, please call your pharmacy first. If no refills are authorized on file call the office.-- Lab Work: Your physician has recommended that you have lab work today: No If you have labs (blood work) drawn today and your tests are completely normal, you will receive your results via MyChart message OR a phone call from our staff.  Please ensure you check your voicemail in the event that you authorized detailed messages to be left on a delegated number. If you have any lab test that is abnormal or we need to change your treatment, we will call you to review the results.  Referrals/Procedures/Imaging: No  Follow-Up: Your next appointment:   Your physician recommends that you schedule a follow-up appointment in: 6 weeks with Dr. de [place].  You will receive a text message or e-mail with a link to a survey about your care and experience with us today! We would greatly appreciate your feedback!   Thanks for letting us be apart of your health journey!!  Primary Care and Sports Medicine   Dr. Raymond de Cuba   We encourage you to activate your patient portal called "MyChart".  Sign up information is provided on this After Visit Summary.  MyChart is used to connect with patients for Virtual Visits (Telemedicine).  Patients are able to view lab/test results, encounter notes, upcoming appointments, etc.  Non-urgent messages can be sent to your provider as well. To learn more about what you can do with MyChart, please visit --  [email or web address].    

## 2021-06-30 NOTE — Assessment & Plan Note (Signed)
Recommend continue with current regimen, did not tolerate metformin in the continues with Ozempic.  Patient seems to be somewhat unclear regarding current dose of Ozempic A1c's have been variable in the past, more recently have been better controlled Recommend continued regular follow-up with endocrinology

## 2021-06-30 NOTE — Assessment & Plan Note (Addendum)
Clinically appears to be euthyroid, no concerns that medication dose adjustment is needed Continue with current medication dosage, continue with regular follow-up with endocrinology

## 2021-06-30 NOTE — Progress Notes (Signed)
New Patient Office Visit  Subjective    Patient ID: Cynthia Patton, female    DOB: May 04, 1960  Age: 61 y.o. MRN: [id number]  CC:  Chief Complaint  Patient presents with   New Patient (Initial Visit)    HPI ALURA OLVEDA presents to establish care Last PCP - [place]  DM: Has been on Ozempic for about 2 years. Currently using 0.25 mg weekly it seems - some lack of clarity with dosing/titration. Had been on metformin at one point in the past, but had side effects with this and so stopped it. Does follow with Endocrinology.  HTN: Currently taking losartan, diltiazem. No recent changes with these medications. Does not check BP at home. No issues with CP or headaches.  Hypothyroidism: Taking levothyroxine 175 mcg, no recent change in medication. Does follow with Endocrinology.  Depression: Taking citalopram 20 mg daily, this has been well-controlled, no recent change in medication. Has done counseling/therapy in the past, none currently.  Patient is originally from [place]. 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 Outside of work, patient enjoys singing, bowling, spending time with family.  Outpatient Encounter Medications as of 06/30/2021  Medication Sig   blood glucose meter kit and supplies KIT Dispense based on patient and insurance preference. Use up to four times daily as directed. (FOR ICD-9 250.00, 250.01).   citalopram (CELEXA) 20 MG tablet Take 1 tablet (20 mg total) by mouth daily.   diltiazem (DILT-XR) 180 MG 24 hr capsule Take 1 capsule (180 mg total) by mouth daily.   fluticasone (FLONASE) 50 MCG/ACT nasal spray Place 2 sprays into both nostrils daily.   gabapentin (NEURONTIN) 100 MG capsule Take 1 capsule (100 mg total) by mouth 2 (two) times daily.   ibuprofen (ADVIL) 800 MG tablet Take 1 tablet (800 mg total) by mouth every 8 (eight) hours as needed.   levalbuterol (XOPENEX HFA) 45 MCG/ACT inhaler  Inhale 1 puff into the lungs every 4 (four) hours as needed for wheezing.   levothyroxine (SYNTHROID) 175 MCG tablet Take by mouth.   losartan (COZAAR) 50 MG tablet Take 50 mg by mouth daily.   meclizine (ANTIVERT) 25 MG tablet Take 1 tablet (25 mg total) by mouth 2 (two) times daily as needed for dizziness.   mometasone (NASONEX) 50 MCG/ACT nasal spray Place 2 sprays into the nose daily.   ONETOUCH VERIO test strip USE UP TO 4 TIMES DAILY AS DIRECTED   Semaglutide, 1 MG/DOSE, 2 MG/1.5ML SOPN Inject into the skin.   triamcinolone cream (KENALOG) 0.1 % Apply 1 Application topically 2 (two) times daily.   albuterol (PROVENTIL) (2.5 MG/3ML) 0.083% nebulizer solution Take 3 mLs (2.5 mg total) by nebulization every 4 (four) hours as needed for wheezing or shortness of breath.   No facility-administered encounter medications on file as of 06/30/2021.    Past Medical History:  Diagnosis Date   Acute low back pain 03/25/2020   Anxiety    Cancer ([place]) 2000?   thyroid cancer   Depression    Diabetes mellitus without complication ([place])    Hyperlipidemia    Hypertension    Thyroid disease     Past Surgical History:  Procedure Laterality Date   ABDOMINAL HYSTERECTOMY     BREAST CYST EXCISION Left    CHOLECYSTECTOMY     INCISE AND DRAIN ABCESS     on right leg    THYROIDECTOMY      Family History  Problem  Relation Age of Onset   Kidney disease Mother    Hypertension Mother    Hypertension Sister    Heart disease Brother    Colon cancer Neg Hx    Colon polyps Neg Hx    Esophageal cancer Neg Hx    Rectal cancer Neg Hx    Stomach cancer Neg Hx     Social History   Socioeconomic History   Marital status: Single    Spouse name: Not on file   Number of children: Not on file   Years of education: Not on file   Highest education level: Not on file  Occupational History   Not on file  Tobacco Use   Smoking status: Every Day    Packs/day: 0.50    Types: Cigarettes   Smokeless  tobacco: Never  Vaping Use   Vaping Use: Never used  Substance and Sexual Activity   Alcohol use: No   Drug use: No   Sexual activity: Not Currently  Other Topics Concern   Not on file  Social History Narrative   Not on file   Social Determinants of Health   Financial Resource Strain: Not on file  Food Insecurity: Not on file  Transportation Needs: Not on file  Physical Activity: Not on file  Stress: Not on file  Social Connections: Not on file  Intimate Partner Violence: Not on file    Objective    BP (!) 153/68   Pulse 78   Ht '5\' 3"'  (1.6 m)   Wt 222 lb 4.8 oz (100.8 kg)   SpO2 100%   BMI 39.38 kg/m   Physical Exam  61 yo female in no acute distress Cardiovascular exam with regular rate and rhythm, no murmur appreciated Lungs clear to auscultation bilaterally  Assessment & Plan:   Problem List Items Addressed This Visit       Cardiovascular and Mediastinum   Essential hypertension    Blood pressure is borderline in office today, systolic is slightly elevated Recommend checking blood pressure at home in order to have a more complete picture of blood pressure management At this time can continue with current medications including losartan and diltiazem.  May need to consider adjustment pending progress with blood pressure readings Recommend DASH diet        Endocrine   Controlled type 2 diabetes mellitus with complication, without long-term current use of insulin ([place]) - Primary    Recommend continue with current regimen, did not tolerate metformin in the continues with Ozempic.  Patient seems to be somewhat unclear regarding current dose of Ozempic A1c's have been variable in the past, more recently have been better controlled Recommend continued regular follow-up with endocrinology      Postoperative hypothyroidism    Clinically appears to be euthyroid, no concerns that medication dose adjustment is needed Continue with current medication dosage,  continue with regular follow-up with endocrinology        Musculoskeletal and Integument   Rash    New issue for patient, primarily located over forearms bilaterally, left slightly worse than right.  Uncertain etiology, suspect possible eczematous type of rash and will treat initially with topical steroid.  She has tried low potency OTC steroid without notable relief.  We will proceed with medium potency steroid at this time.  If symptoms do not improve as expected or if worsening is noted, would consider transitioning to therapy such as topical antifungal       Return in about 6 weeks (around 08/11/2021)  for Follow-up.   Naevia Unterreiner J [place], MD

## 2021-07-01 DIAGNOSIS — M722 Plantar fascial fibromatosis: Secondary | ICD-10-CM | POA: Diagnosis not present

## 2021-08-10 ENCOUNTER — Ambulatory Visit: Payer: Self-pay | Admitting: Nurse Practitioner

## 2021-08-11 ENCOUNTER — Ambulatory Visit (HOSPITAL_BASED_OUTPATIENT_CLINIC_OR_DEPARTMENT_OTHER): Payer: BC Managed Care – PPO | Admitting: Family Medicine

## 2021-08-14 ENCOUNTER — Encounter (HOSPITAL_BASED_OUTPATIENT_CLINIC_OR_DEPARTMENT_OTHER): Payer: Self-pay | Admitting: Family Medicine

## 2021-08-20 ENCOUNTER — Ambulatory Visit (HOSPITAL_BASED_OUTPATIENT_CLINIC_OR_DEPARTMENT_OTHER): Payer: BC Managed Care – PPO | Admitting: Family Medicine

## 2021-08-21 ENCOUNTER — Ambulatory Visit: Payer: Self-pay | Admitting: Nurse Practitioner

## 2021-08-24 ENCOUNTER — Encounter (HOSPITAL_BASED_OUTPATIENT_CLINIC_OR_DEPARTMENT_OTHER): Payer: Self-pay | Admitting: Family Medicine

## 2021-09-10 ENCOUNTER — Ambulatory Visit (INDEPENDENT_AMBULATORY_CARE_PROVIDER_SITE_OTHER): Payer: BC Managed Care – PPO | Admitting: Nurse Practitioner

## 2021-09-10 ENCOUNTER — Encounter: Payer: Self-pay | Admitting: Nurse Practitioner

## 2021-09-10 VITALS — BP 148/66 | HR 78 | Temp 97.7°F | Resp 16 | Ht 66.0 in | Wt 224.0 lb

## 2021-09-10 DIAGNOSIS — I10 Essential (primary) hypertension: Secondary | ICD-10-CM

## 2021-09-10 DIAGNOSIS — L309 Dermatitis, unspecified: Secondary | ICD-10-CM | POA: Diagnosis not present

## 2021-09-10 MED ORDER — DESONIDE 0.05 % EX CREA: TOPICAL_CREAM | Freq: Two times a day (BID) | CUTANEOUS | 0 refills | Status: DC

## 2021-09-10 NOTE — Progress Notes (Signed)
'@Patient'  ID: Cynthia Patton, female    DOB: Sep 30, 1960, 61 y.o.   MRN: [id number]  Chief Complaint  Patient presents with   Rash    Referring provider: Vevelyn Francois, NP   HPI  61 year old female with past medical history of Acute low back pain (03/25/2020), Anxiety, Cancer ([place]) (2000?), Depression, Diabetes mellitus without complication ([place]), Hyperlipidemia, Hypertension, and Thyroid disease.    Patient was seen in June by another PCP office for diabetes and hypertension follow up. She will be switching to [facility] for healthcare.   She is followed by endocrinology for her DM and hypothyroidism. Will be seen by endocrinology.   She is following up today for her HTN. She is currently on Diltiazem 180 mg and Losartan 50 mg. Denies headache, dizziness, visual changes, shortness of breath, dyspnea on exertion, chest pain, nausea, vomiting or any edema.    Complains today of eczema returning. She was prescribed kenalog by PCP at [facility], but states this has not helped her.    Allergies  Allergen Reactions   Floxin [Ocuflox] Other (See Comments)    Disoriented   Ofloxacin Other (See Comments)    Disoriented   Percocet [Oxycodone-Acetaminophen]     Immunization History  Administered Date(s) Administered   PFIZER(Purple Top)SARS-COV-2 Vaccination 03/25/2019, 04/16/2019, 01/26/2020   Pneumococcal Polysaccharide-23 03/19/2016   Tdap 02/09/2016    Past Medical History:  Diagnosis Date   Acute low back pain 03/25/2020   Anxiety    Cancer ([place]) 2000?   thyroid cancer   Depression    Diabetes mellitus without complication ([place])    Hyperlipidemia    Hypertension    Thyroid disease     Tobacco History: Social History   Tobacco Use  Smoking Status Every Day   Packs/day: 0.50   Types: Cigarettes  Smokeless Tobacco Never   Ready to quit: Not Answered Counseling given: Not Answered   Outpatient Encounter Medications as of 09/10/2021  Medication Sig   citalopram  (CELEXA) 20 MG tablet Take 1 tablet (20 mg total) by mouth daily.   desonide (DESOWEN) 0.05 % cream Apply topically 2 (two) times daily.   diltiazem (DILT-XR) 180 MG 24 hr capsule Take 1 capsule (180 mg total) by mouth daily.   fluticasone (FLONASE) 50 MCG/ACT nasal spray Place 2 sprays into both nostrils daily.   gabapentin (NEURONTIN) 100 MG capsule Take 1 capsule (100 mg total) by mouth 2 (two) times daily.   ibuprofen (ADVIL) 800 MG tablet Take 1 tablet (800 mg total) by mouth every 8 (eight) hours as needed.   levalbuterol (XOPENEX HFA) 45 MCG/ACT inhaler Inhale 1 puff into the lungs every 4 (four) hours as needed for wheezing.   levothyroxine (SYNTHROID) 175 MCG tablet Take by mouth.   losartan (COZAAR) 50 MG tablet Take 50 mg by mouth daily.   meclizine (ANTIVERT) 25 MG tablet Take 1 tablet (25 mg total) by mouth 2 (two) times daily as needed for dizziness.   mometasone (NASONEX) 50 MCG/ACT nasal spray Place 2 sprays into the nose daily.   albuterol (PROVENTIL) (2.5 MG/3ML) 0.083% nebulizer solution Take 3 mLs (2.5 mg total) by nebulization every 4 (four) hours as needed for wheezing or shortness of breath.   blood glucose meter kit and supplies KIT Dispense based on patient and insurance preference. Use up to four times daily as directed. (FOR ICD-9 250.00, 250.01).   ONETOUCH VERIO test strip USE UP TO 4 TIMES DAILY AS DIRECTED   Semaglutide, 1 MG/DOSE, 2  MG/1.5ML SOPN Inject into the skin. (Patient not taking: Reported on 09/10/2021)   triamcinolone cream (KENALOG) 0.1 % Apply 1 Application topically 2 (two) times daily. (Patient not taking: Reported on 09/10/2021)   No facility-administered encounter medications on file as of 09/10/2021.     Review of Systems  Review of Systems  Constitutional: Negative.   HENT: Negative.    Cardiovascular: Negative.   Gastrointestinal: Negative.   Skin:  Positive for rash.  Allergic/Immunologic: Negative.   Neurological: Negative.    Psychiatric/Behavioral: Negative.         Physical Exam  BP (!) 148/66 (BP Location: Right Arm, Patient Position: Sitting, Cuff Size: Large)   Pulse 78   Temp 97.7 F (36.5 C)   Resp 16   Ht '5\' 6"'  (1.676 m)   Wt 224 lb (101.6 kg)   SpO2 100%   BMI 36.15 kg/m   Wt Readings from Last 5 Encounters:  09/10/21 224 lb (101.6 kg)  06/30/21 222 lb 4.8 oz (100.8 kg)  05/21/21 210 lb (95.3 kg)  04/09/21 220 lb 0.4 oz (99.8 kg)  02/09/21 217 lb 6 oz (98.6 kg)     Physical Exam Vitals and nursing note reviewed.  Constitutional:      General: She is not in acute distress.    Appearance: She is well-developed.  Cardiovascular:     Rate and Rhythm: Normal rate and regular rhythm.  Pulmonary:     Effort: Pulmonary effort is normal.     Breath sounds: Normal breath sounds.  Neurological:     Mental Status: She is alert and oriented to person, place, and time.      Lab Results:  CBC    Component Value Date/Time   WBC 7.1 05/21/2021 1042   RBC 4.49 05/21/2021 1042   HGB 13.7 05/21/2021 1042   HGB 12.6 06/24/2020 1421   HCT 41.8 05/21/2021 1042   HCT 38.5 06/24/2020 1421   PLT 322 05/21/2021 1042   PLT 363 06/24/2020 1421   MCV 93.1 05/21/2021 1042   MCV 92 06/24/2020 1421   MCH 30.5 05/21/2021 1042   MCHC 32.8 05/21/2021 1042   RDW 12.8 05/21/2021 1042   RDW 12.5 06/24/2020 1421   LYMPHSABS 2.4 06/24/2020 1421   MONOABS 0.4 04/10/2020 1130   EOSABS 0.1 06/24/2020 1421   BASOSABS 0.0 06/24/2020 1421    BMET    Component Value Date/Time   NA 140 05/21/2021 1042   NA 143 02/09/2021 1139   K 3.9 05/21/2021 1042   CL 108 05/21/2021 1042   CO2 24 05/21/2021 1042   GLUCOSE 122 (H) 05/21/2021 1042   BUN 9 05/21/2021 1042   BUN 14 02/09/2021 1139   CREATININE 0.84 05/21/2021 1042   CREATININE 0.90 10/14/2016 0844   CALCIUM 8.8 (L) 05/21/2021 1042   GFRNONAA >60 05/21/2021 1042   GFRNONAA 71 10/14/2016 0844   GFRAA 81 07/27/2019 1111   GFRAA 82 10/14/2016  0844     Assessment & Plan:   Eczema - desonide (DESOWEN) 0.05 % cream; Apply topically 2 (two) times daily.  Dispense: 30 g; Refill: 0  2. Hypertension:  - Continue current medications  - low salt diet   Follow up:  Follow up with new PCP at [place] [facility]  Patient Instructions  1. Eczema, unspecified type  - desonide (DESOWEN) 0.05 % cream; Apply topically 2 (two) times daily.  Dispense: 30 g; Refill: 0  2. Hypertension:  - Continue current medications  - low salt diet  Follow up:  Follow up with new PCP at [facility]   Eczema Eczema refers to a group of skin conditions that cause skin to become rough and inflamed. Each type of eczema has different triggers, symptoms, and treatments. Eczema of any type is usually itchy. Symptoms range from mild to severe. Eczema is not spread from person to person (is not contagious). It can appear on different parts of the body at different times. One person's eczema may look different from another person's eczema. What are the causes? The exact cause of this condition is not known. However, exposure to certain environmental factors, irritants, and allergens can make the condition worse. What are the signs or symptoms? Symptoms of this condition depend on the type of eczema you have. The types include: Contact dermatitis. There are two kinds: Irritant contact dermatitis. This happens when something irritates the skin and causes a rash. Allergic contact dermatitis. This happens when your skin comes in contact with something you are allergic to (allergens). This can include poison ivy, chemicals, or medicines that were applied to your skin. Atopic dermatitis. This is a long-term (chronic) skin disease that keeps coming back (recurring). It is the most common type of eczema. Usual symptoms are a red rash and itchy, dry, scaly skin. It usually starts showing signs in infancy and can last through adulthood. Dyshidrotic eczema. This is a  form of eczema on the hands and feet. It shows up as very itchy, fluid-filled blisters. It can affect people of any age but is more common before age 28. Hand eczema. This causes very itchy areas of skin on the palms and sides of the hands and fingers. This type of eczema is common in industrial jobs where you may be exposed to different types of irritants. Lichen simplex chronicus. This type of eczema occurs when a person constantly scratches one area of the body. Repeated scratching of the area leads to thickened skin (lichenification). This condition can accompany other types of eczema. It is more common in adults but may also be seen in children. Nummular eczema. This is a common type of eczema that most often affects the lower legs and the backs of the hands. It typically causes an itchy, red, circular, crusty lesion (plaque). Scratching may become a habit and can cause bleeding. Nummular eczema occurs most often in middle-aged or older people. Seborrheic dermatitis. This is a common skin disease that mainly affects the scalp. It may also affect other oily areas of the body, such as the face, sides of the nose, eyebrows, ears, eyelids, and chest. It is marked by small scaling and redness of the skin (erythema). This can affect people of all ages. In infants, this condition is called cradle cap. Stasis dermatitis. This is a common skin disease that can cause itching, scaling, and hyperpigmentation, usually on the legs and feet. It occurs most often in people who have a condition that prevents blood from being pumped through the veins in the legs (chronic venous insufficiency). Stasis dermatitis is a chronic condition that needs long-term management. How is this diagnosed? This condition may be diagnosed based on: A physical exam of your skin. Your medical history. Skin patch tests. These tests involve using patches that contain possible allergens and placing them on your back. Your health care provider  will check in a few days to see if an allergic reaction occurred. How is this treated? Treatment for eczema is based on the type of eczema you have. You may be given hydrocortisone  steroid medicine or antihistamines. These can relieve itching quickly and help reduce inflammation. These may be prescribed or purchased over the counter, depending on the strength that is needed. Follow these instructions at home: Take or apply over-the-counter and prescription medicines only as told by your health care provider. Use creams or ointments to moisturize your skin. Do not use lotions. Learn what triggers or irritates your symptoms so you can avoid these things. Treat symptom flare-ups quickly. Do not scratch your skin. This can make your rash worse. Keep all follow-up visits. This is important. Where to find more information American Academy of Dermatology: MemberVerification.ca National Eczema Association: nationaleczema.org The Society for Pediatric Dermatology: pedsderm.net Contact a health care provider if: You have severe itching, even with treatment. You scratch your skin regularly until it bleeds. Your rash looks different than usual. Your skin is painful, swollen, or more red than usual. You have a fever. Summary Eczema refers to a group of skin conditions that cause skin to become rough and inflamed. Each type has different triggers. Eczema of any type causes itching that may range from mild to severe. Treatment varies based on the type of eczema you have. Hydrocortisone steroid medicine or antihistamines can help with itching and inflammation. Protecting your skin is the best way to prevent eczema. Use creams or ointments to moisturize your skin. Avoid triggers and irritants. Treat flare-ups quickly. This information is not intended to replace advice given to you by your health care provider. Make sure you discuss any questions you have with your health care provider. Document Revised: 10/08/2019  Document Reviewed: 10/08/2019 Elsevier Patient Education  2023 [place], [place] 09/10/2021

## 2021-09-10 NOTE — Progress Notes (Signed)
Concerns with rash on arms, neck, and some place on legs

## 2021-09-10 NOTE — Assessment & Plan Note (Signed)
-   desonide (DESOWEN) 0.05 % cream; Apply topically 2 (two) times daily.  Dispense: 30 g; Refill: 0  2. Hypertension:  - Continue current medications  - low salt diet   Follow up:  Follow up with new PCP at [facility] [facility]

## 2021-09-10 NOTE — Patient Instructions (Addendum)
1. Eczema, unspecified type  - desonide (DESOWEN) 0.05 % cream; Apply topically 2 (two) times daily.  Dispense: 30 g; Refill: 0  2. Hypertension:  - Continue current medications  - low salt diet   Follow up:  Follow up with new PCP at [facility]   Eczema Eczema refers to a group of skin conditions that cause skin to become rough and inflamed. Each type of eczema has different triggers, symptoms, and treatments. Eczema of any type is usually itchy. Symptoms range from mild to severe. Eczema is not spread from person to person (is not contagious). It can appear on different parts of the body at different times. One person's eczema may look different from another person's eczema. What are the causes? The exact cause of this condition is not known. However, exposure to certain environmental factors, irritants, and allergens can make the condition worse. What are the signs or symptoms? Symptoms of this condition depend on the type of eczema you have. The types include: Contact dermatitis. There are two kinds: Irritant contact dermatitis. This happens when something irritates the skin and causes a rash. Allergic contact dermatitis. This happens when your skin comes in contact with something you are allergic to (allergens). This can include poison ivy, chemicals, or medicines that were applied to your skin. Atopic dermatitis. This is a long-term (chronic) skin disease that keeps coming back (recurring). It is the most common type of eczema. Usual symptoms are a red rash and itchy, dry, scaly skin. It usually starts showing signs in infancy and can last through adulthood. Dyshidrotic eczema. This is a form of eczema on the hands and feet. It shows up as very itchy, fluid-filled blisters. It can affect people of any age but is more common before age 13. Hand eczema. This causes very itchy areas of skin on the palms and sides of the hands and fingers. This type of eczema is common in industrial  jobs where you may be exposed to different types of irritants. Lichen simplex chronicus. This type of eczema occurs when a person constantly scratches one area of the body. Repeated scratching of the area leads to thickened skin (lichenification). This condition can accompany other types of eczema. It is more common in adults but may also be seen in children. Nummular eczema. This is a common type of eczema that most often affects the lower legs and the backs of the hands. It typically causes an itchy, red, circular, crusty lesion (plaque). Scratching may become a habit and can cause bleeding. Nummular eczema occurs most often in middle-aged or older people. Seborrheic dermatitis. This is a common skin disease that mainly affects the scalp. It may also affect other oily areas of the body, such as the face, sides of the nose, eyebrows, ears, eyelids, and chest. It is marked by small scaling and redness of the skin (erythema). This can affect people of all ages. In infants, this condition is called cradle cap. Stasis dermatitis. This is a common skin disease that can cause itching, scaling, and hyperpigmentation, usually on the legs and feet. It occurs most often in people who have a condition that prevents blood from being pumped through the veins in the legs (chronic venous insufficiency). Stasis dermatitis is a chronic condition that needs long-term management. How is this diagnosed? This condition may be diagnosed based on: A physical exam of your skin. Your medical history. Skin patch tests. These tests involve using patches that contain possible allergens and placing them on your back. Your  health care provider will check in a few days to see if an allergic reaction occurred. How is this treated? Treatment for eczema is based on the type of eczema you have. You may be given hydrocortisone steroid medicine or antihistamines. These can relieve itching quickly and help reduce inflammation. These may be  prescribed or purchased over the counter, depending on the strength that is needed. Follow these instructions at home: Take or apply over-the-counter and prescription medicines only as told by your health care provider. Use creams or ointments to moisturize your skin. Do not use lotions. Learn what triggers or irritates your symptoms so you can avoid these things. Treat symptom flare-ups quickly. Do not scratch your skin. This can make your rash worse. Keep all follow-up visits. This is important. Where to find more information American Academy of Dermatology: MemberVerification.ca National Eczema Association: nationaleczema.org The Society for Pediatric Dermatology: pedsderm.net Contact a health care provider if: You have severe itching, even with treatment. You scratch your skin regularly until it bleeds. Your rash looks different than usual. Your skin is painful, swollen, or more red than usual. You have a fever. Summary Eczema refers to a group of skin conditions that cause skin to become rough and inflamed. Each type has different triggers. Eczema of any type causes itching that may range from mild to severe. Treatment varies based on the type of eczema you have. Hydrocortisone steroid medicine or antihistamines can help with itching and inflammation. Protecting your skin is the best way to prevent eczema. Use creams or ointments to moisturize your skin. Avoid triggers and irritants. Treat flare-ups quickly. This information is not intended to replace advice given to you by your health care provider. Make sure you discuss any questions you have with your health care provider. Document Revised: 10/08/2019 Document Reviewed: 10/08/2019 Elsevier Patient Education  [place].

## 2021-11-03 ENCOUNTER — Encounter: Payer: Self-pay | Admitting: Gastroenterology

## 2021-11-15 ENCOUNTER — Other Ambulatory Visit: Payer: Self-pay | Admitting: Nurse Practitioner

## 2021-11-15 DIAGNOSIS — I10 Essential (primary) hypertension: Secondary | ICD-10-CM

## 2021-11-16 NOTE — Telephone Encounter (Signed)
Sent my chart message to confirm she is a patient of [facility]. Cynthia Patton

## 2021-11-20 NOTE — Telephone Encounter (Signed)
Celexa was called and canceled due to needing to be approved by doctor. However b/p med was filled for 30 days. [place]

## 2022-01-16 ENCOUNTER — Other Ambulatory Visit: Payer: Self-pay | Admitting: Nurse Practitioner

## 2022-01-16 DIAGNOSIS — I10 Essential (primary) hypertension: Secondary | ICD-10-CM

## 2022-01-25 ENCOUNTER — Other Ambulatory Visit: Payer: Self-pay | Admitting: Nurse Practitioner

## 2022-01-25 DIAGNOSIS — I10 Essential (primary) hypertension: Secondary | ICD-10-CM

## 2022-01-25 MED ORDER — DILTIAZEM HCL ER 180 MG PO CP24: 180.0000 mg | ORAL_CAPSULE | Freq: Every day | ORAL | 0 refills | Status: DC

## 2022-03-24 ENCOUNTER — Encounter (HOSPITAL_BASED_OUTPATIENT_CLINIC_OR_DEPARTMENT_OTHER): Payer: Self-pay

## 2022-06-08 ENCOUNTER — Telehealth: Payer: Self-pay

## 2022-06-08 NOTE — Telephone Encounter (Signed)
Called pt and left a message. Gh 

## 2022-06-14 ENCOUNTER — Telehealth (HOSPITAL_BASED_OUTPATIENT_CLINIC_OR_DEPARTMENT_OTHER): Payer: Self-pay | Admitting: Family Medicine

## 2022-10-29 ENCOUNTER — Ambulatory Visit (HOSPITAL_COMMUNITY)
Admission: EM | Admit: 2022-10-29 | Discharge: 2022-10-29 | Disposition: A | Discharge status: Home / Self Care | Payer: 59

## 2022-10-29 DIAGNOSIS — F331 Major depressive disorder, recurrent, moderate: Secondary | ICD-10-CM

## 2022-10-29 NOTE — Progress Notes (Signed)
   10/29/22 0823  [facility] Triage Screening (Walk-ins at [facility] only)  How Did You Hear About [place]? Self  What Is the Reason for Your Visit/Call Today? Peggysue Federer presents to [facility] voluntarily unaccompanied due to worsening depression symptoms. Pt states her mother passed in June of 2019 and she was her caregiver and her birthday is this month and she states her depression symptoms are increased around this time of year.  Pt reports increased agitation, crying spells. Pt states she is need of a doctors note for work because she did not go to work yesterday or today.Pt is not established with outpatient therapy but is interested in starting therapy.  Pt denies SI/HI and AVH.  How Long Has This Been Causing You Problems? <Week  Have You Recently Had Any Thoughts About Hurting Yourself? No  Are You Planning to Commit Suicide/Harm Yourself At This time? No  Have you Recently Had Thoughts About Hurting Someone Karolee Ohs? No  Are You Planning To Harm Someone At This Time? No  Are you currently experiencing any auditory, visual or other hallucinations? No  Have You Used Any Alcohol or Drugs in the Past 24 Hours? No  Do you have any current medical co-morbidities that require immediate attention? No  Clinician description of patient physical appearance/behavior: tearful, cooperative, casually dressed  What Do You Feel Would Help You the Most Today? Treatment for Depression or other mood problem  If access to [facility] was not available, would you have sought care in the Emergency Department? No  Determination of Need Routine (7 days)  Options For Referral Outpatient Therapy;Medication Management

## 2022-10-29 NOTE — ED Provider Notes (Addendum)
Behavioral Health Urgent Care Medical Screening Exam  Patient Name: Cynthia Patton MRN: [id number] Date of Evaluation: 10/29/22 Chief Complaint: "I am depressed because my mom passed in 2019 and her birthday is in [birth year] Diagnosis:  Final diagnoses:  Moderate episode of recurrent major depressive disorder (HCC)    History of Present illness: Cynthia Patton is a 62 y.o female with a history of generalized anxiety disorder and major depressive disorder, who presents voluntarily and unaccompanied to the [facility] with a complaint of worsening depression. Patient states that her depressive symptoms began last month when church members accused her of stealing 500 dollars that she denies taking. She also states that her mom passed away in 2019-07-10and her birthday is on Halloween, so October is a tough month for her. She expresses feelings of sadness, agitation, insomnia, decreased appetite (1 meal/day), and decreased energy. She states that she works for a company called [employer] and has been there since May 2024. Patient states she has missed the last 2 days of work due to her depression, but goes on to say, "I am the model employee". She states that she became upset at work a couple days ago and hung up on a customer. She acknowledges her hobbies as cooking and going to [employer] although she says she hasn't found much pleasure in doing theses things over the past 2 weeks. She states she lives with her aunt and pays her 600 dollars a month in rent. Patient states she has no kids and no support system. She denies SI, HI, and AVH.    On evaluation, Cynthia Patton is sitting up right drinking a cup of coffee. She is alert and oriented x 4 and does not  appear to be in acute distress. Her thought process is linear and goal oriented. Her speech is clear and coherent. Her mood is depressed and affect is congruent. She maintains good eye contact. .She is casually and appropriately dressed  for the season. She is calm, cooperative, and pleasant throughout interview.      Flowsheet Row ED from 10/29/2022 in [facility] ED from 05/21/2021 in [facility] Emergency Department at [facility]  C-SSRS RISK CATEGORY No Risk No Risk       Psychiatric Specialty Exam  Presentation  General Appearance:Appropriate for Environment; Casual   Eye Contact:Good   Speech:Clear and Coherent; Normal Rate   Speech Volume:Normal   Handedness:Right    Mood and Affect  Mood: Depressed; Anxious   Affect: No data recorded   Thought Process  Thought Processes: Coherent   Descriptions of Associations:Intact   Orientation:Full (Time, Place and Person)   Thought Content:Logical  Ideas of Reference:None   Suicidal Thoughts:No   Homicidal Thoughts:No    Sensorium  Memory: Immediate Good   Judgment: Good   Insight: Good    Executive Functions  Concentration: Good   Attention Span: Good   Recall: Good   Fund of Knowledge: Good   Language: Good    Psychomotor Activity  Psychomotor Activity: Normal    Assets  Assets: Communication Skills; Desire for Improvement; Housing; Transportation    Sleep  Sleep: Fair   Physical Exam: Physical Exam Vitals reviewed.  HENT:     Head: Normocephalic.     Nose: Nose normal.  Eyes:     Pupils: Pupils are equal, round, and reactive to light.  Cardiovascular:     Rate and Rhythm: Normal rate and regular rhythm.  Pulmonary:  Effort: Pulmonary effort is normal.  Musculoskeletal:        General: Normal range of motion.     Cervical back: Normal range of motion.  Neurological:     General: No focal deficit present.     Mental Status: She is alert and oriented to person, place, and time.  Psychiatric:        Attention and Perception: Attention normal.        Mood and Affect: Mood and affect normal.        Speech: Speech normal.         Behavior: Behavior normal.        Thought Content: Thought content normal.        Cognition and Memory: Cognition and memory normal.        Judgment: Judgment normal.    Review of Systems  Constitutional: Negative.   HENT: Negative.    Eyes: Negative.   Respiratory: Negative.    Cardiovascular: Negative.   Gastrointestinal: Negative.   Genitourinary: Negative.   Musculoskeletal: Negative.   Skin: Negative.   Neurological: Negative.   Endo/Heme/Allergies: Negative.   Psychiatric/Behavioral:  Positive for depression. The patient has insomnia.    Blood pressure (!) 162/82, pulse 88, temperature 97.9 F (36.6 C), temperature source Oral, resp. rate 18, SpO2 99%. There is no height or weight on file to calculate BMI.  Musculoskeletal: Strength & Muscle Tone: within normal limits Gait & Station: normal Patient leans: Right   [facility] MSE Discharge Disposition for Follow up and Recommendations: Based on my evaluation the patient does not appear to have an emergency medical condition and can be discharged with resources and follow up care in outpatient services for Individual Therapy  Based on the information you have provided and the presenting issue, outpatient services and resources for have been recommended.  It is imperative that you follow through with treatment recommendations within 5-7 days from the of discharge to mitigate further risk to your safety and mental well-being. A list of referrals has been provided below to get you started.  You are not limited to the list provided.  In case of an urgent crisis, you may contact the Mobile Crisis Unit with [facility] at 1.3676540672.  Writer met with the patient and was able to link the patient with Frederic Jericho, LCSW to have an appointment with her on Monday, November 01, 2022 at [facility], MSW, [place], [place] [address] [place], [place] [address] Office [phone] Fax [phone]   I am  pleased that you have selected me as your therapist.  I have a Child psychotherapist of Social Work degree, obtained at the [employer] in 2000.  I am licensed with the [facility] Social Work Public librarian [phone]).   The services offered to you include individual, family, and group counseling.  My therapeutic approach  is derived from my training and experience in Cognitive-Behavioral Therapy, Solution Focused Therapy, Reality Therapy, as well as, interpersonal and developmental theories of counseling.  I have special interests in anxiety, depression, sexual and physical abuse, play therapy, child and adolescent development, and family systems.  I am specifically trained in Trauma Focused-Cognitive Behavioral Therapy (TF-CBT).  I will not discriminate because of age, race, gender, sexual orientation or religion.  If there is anything I should know concerning your culture or religion, I ask that you please inform me so that I can better understand you.  If for any reason, I determine that my knowledge and expertise are not sufficient for your particular needs, I will make every effort to refer you to another counselor who is prepared to work more effectively with you.  Are you or a loved one dealing with the aftermath of a traumatic experience? Do life transitions leave you grappling with feelings of depression or anxiety? Are you concerned about your child's negative behaviors, sleep disturbances, or difficulties during a divorce? It's important to understand that children primarily operate based on emotions, while adults tend to use rational thinking. This emotional focus can impact a child's ability to make effective decisions and express their feelings.   Services Individual Counseling  Individual counseling is a personal opportunity to receive support and experience growth during challenging times in life. Individual counseling can help one deal with many personal  topics in life such as anger, depression, anxiety, substance abuse, marriage and relationship challenges, parenting problems, school difficulties, career changes, etc.  Family Counseling  Families often are faced with issues and challenges that greatly impact all the members. The goal of family therapy is to renew and maintain the natural family structure and support group with an emphasis on returning the individual to a healthy family and community life. Family counseling can help improve communication, resolve conflict, and improve connections. [/one-half-first]    Parenting Coordinator Parent Coordinator is a neutral third party acting in the children's best interest attempting to reach a fair compromise of the issues at hand. Assisting parents manage their parenting plan, improve communication, and resolve disputes. A court appointment neutral party.     Treatment specialization includes: Depression  Anxiety  Attention Deficit/Hyperactive Disorder  Grief Counseling  Conflict Resolution  Emotional Regulation  Family Counseling  Divorce/Separation  Mediation  Conflict Resolution  Relationship Building  Parenting Coordinator  Parental Support    Layla Barter, NP 10/29/2022, 10:25 AM    Crissie Reese, RN 10/29/22 1022

## 2022-10-29 NOTE — Discharge Instructions (Addendum)
Based on the information you have provided and the presenting issue, outpatient services and resources for have been recommended.  It is imperative that you follow through with treatment recommendations within 5-7 days from the of discharge to mitigate further risk to your safety and mental well-being. A list of referrals has been provided below to get you started.  You are not limited to the list provided.  In case of an urgent crisis, you may contact the Mobile Crisis Unit with [facility] at [phone].  Writer met with the patient and was able to link the patient with Cynthia Jericho, LCSW to have an appointment with her on Monday, November 01, 2022 at [facility], MSW, [place], [place] [address] [place], [place] [address] Office [phone] Fax [phone]   I am pleased that you have selected me as your therapist.  I have a Child psychotherapist of Social Work degree, obtained at the [employer] [place] [employer] in 2000.  I am licensed with the [facility] Social Work Public librarian [phone]).   The services offered to you include individual, family, and group counseling.  My therapeutic approach  is derived from my training and experience in Cognitive-Behavioral Therapy, Solution Focused Therapy, Reality Therapy, as well as, interpersonal and developmental theories of counseling.  I have special interests in anxiety, depression, sexual and physical abuse, play therapy, child and adolescent development, and family systems.  I am specifically trained in Trauma Focused-Cognitive Behavioral Therapy (TF-CBT).  I will not discriminate because of age, race, gender, sexual orientation or religion.  If there is anything I should know concerning your culture or religion, I ask that you please inform me so that I can better understand you.  If for any reason, I determine that my knowledge and expertise are not sufficient for your particular needs, I  will make every effort to refer you to another counselor who is prepared to work more effectively with you.  Are you or a loved one dealing with the aftermath of a traumatic experience? Do life transitions leave you grappling with feelings of depression or anxiety? Are you concerned about your child's negative behaviors, sleep disturbances, or difficulties during a divorce? It's important to understand that children primarily operate based on emotions, while adults tend to use rational thinking. This emotional focus can impact a child's ability to make effective decisions and express their feelings.   Services Individual Counseling  Individual counseling is a personal opportunity to receive support and experience growth during challenging times in life. Individual counseling can help one deal with many personal topics in life such as anger, depression, anxiety, substance abuse, marriage and relationship challenges, parenting problems, school difficulties, career changes, etc.  Family Counseling  Families often are faced with issues and challenges that greatly impact all the members. The goal of family therapy is to renew and maintain the natural family structure and support group with an emphasis on returning the individual to a healthy family and community life. Family counseling can help improve communication, resolve conflict, and improve connections. [/one-half-first]    Parenting Coordinator Parent Coordinator is a neutral third party acting in the children's best interest attempting to reach a fair compromise of the issues at hand. Assisting parents manage their parenting plan, improve communication, and resolve disputes. A court appointment neutral party.     Treatment specialization includes: Depression  Anxiety  Attention Deficit/Hyperactive Disorder  Grief Counseling  Conflict Resolution  Emotional  Regulation  Family Counseling  Divorce/Separation  Mediation  Conflict  Resolution  Relationship Building  Parenting Coordinator  Parental Support

## 2022-11-13 ENCOUNTER — Inpatient Hospital Stay (HOSPITAL_COMMUNITY)
Admission: EM | Admit: 2022-11-13 | Discharge: 2022-11-14 | DRG: 322 | Disposition: A | Discharge status: Home / Self Care | Payer: Commercial Managed Care - HMO | Attending: Cardiovascular Disease | Admitting: Cardiovascular Disease

## 2022-11-13 ENCOUNTER — Emergency Department (HOSPITAL_COMMUNITY): Payer: Commercial Managed Care - HMO

## 2022-11-13 ENCOUNTER — Other Ambulatory Visit: Payer: Self-pay

## 2022-11-13 ENCOUNTER — Encounter (HOSPITAL_COMMUNITY)
Admission: EM | Disposition: A | Discharge status: Home / Self Care | Payer: Self-pay | Source: Home / Self Care | Attending: Cardiovascular Disease

## 2022-11-13 ENCOUNTER — Encounter (HOSPITAL_COMMUNITY): Payer: Self-pay | Admitting: Emergency Medicine

## 2022-11-13 DIAGNOSIS — Z6837 Body mass index (BMI) 37.0-37.9, adult: Secondary | ICD-10-CM | POA: Diagnosis not present

## 2022-11-13 DIAGNOSIS — F419 Anxiety disorder, unspecified: Secondary | ICD-10-CM | POA: Diagnosis present

## 2022-11-13 DIAGNOSIS — Z841 Family history of disorders of kidney and ureter: Secondary | ICD-10-CM

## 2022-11-13 DIAGNOSIS — E785 Hyperlipidemia, unspecified: Secondary | ICD-10-CM | POA: Diagnosis present

## 2022-11-13 DIAGNOSIS — Z8249 Family history of ischemic heart disease and other diseases of the circulatory system: Secondary | ICD-10-CM | POA: Diagnosis not present

## 2022-11-13 DIAGNOSIS — Z881 Allergy status to other antibiotic agents status: Secondary | ICD-10-CM | POA: Diagnosis not present

## 2022-11-13 DIAGNOSIS — Z885 Allergy status to narcotic agent status: Secondary | ICD-10-CM

## 2022-11-13 DIAGNOSIS — I213 ST elevation (STEMI) myocardial infarction of unspecified site: Secondary | ICD-10-CM | POA: Diagnosis present

## 2022-11-13 DIAGNOSIS — I2510 Atherosclerotic heart disease of native coronary artery without angina pectoris: Secondary | ICD-10-CM | POA: Diagnosis present

## 2022-11-13 DIAGNOSIS — Z79899 Other long term (current) drug therapy: Secondary | ICD-10-CM

## 2022-11-13 DIAGNOSIS — F17210 Nicotine dependence, cigarettes, uncomplicated: Secondary | ICD-10-CM | POA: Diagnosis present

## 2022-11-13 DIAGNOSIS — E669 Obesity, unspecified: Secondary | ICD-10-CM | POA: Diagnosis present

## 2022-11-13 DIAGNOSIS — I25110 Atherosclerotic heart disease of native coronary artery with unstable angina pectoris: Secondary | ICD-10-CM | POA: Diagnosis not present

## 2022-11-13 DIAGNOSIS — E890 Postprocedural hypothyroidism: Secondary | ICD-10-CM | POA: Diagnosis present

## 2022-11-13 DIAGNOSIS — Z720 Tobacco use: Secondary | ICD-10-CM | POA: Insufficient documentation

## 2022-11-13 DIAGNOSIS — E118 Type 2 diabetes mellitus with unspecified complications: Secondary | ICD-10-CM | POA: Diagnosis present

## 2022-11-13 DIAGNOSIS — Z79890 Hormone replacement therapy (postmenopausal): Secondary | ICD-10-CM

## 2022-11-13 DIAGNOSIS — F32A Depression, unspecified: Secondary | ICD-10-CM | POA: Diagnosis present

## 2022-11-13 DIAGNOSIS — Z7902 Long term (current) use of antithrombotics/antiplatelets: Secondary | ICD-10-CM | POA: Diagnosis not present

## 2022-11-13 DIAGNOSIS — Z85850 Personal history of malignant neoplasm of thyroid: Secondary | ICD-10-CM | POA: Diagnosis not present

## 2022-11-13 DIAGNOSIS — I10 Essential (primary) hypertension: Secondary | ICD-10-CM | POA: Diagnosis present

## 2022-11-13 DIAGNOSIS — Z7982 Long term (current) use of aspirin: Secondary | ICD-10-CM | POA: Diagnosis not present

## 2022-11-13 DIAGNOSIS — I2111 ST elevation (STEMI) myocardial infarction involving right coronary artery: Secondary | ICD-10-CM | POA: Diagnosis present

## 2022-11-13 HISTORY — PX: LEFT HEART CATH AND CORONARY ANGIOGRAPHY: CATH118249

## 2022-11-13 HISTORY — PX: CORONARY/GRAFT ACUTE MI REVASCULARIZATION: CATH118305

## 2022-11-13 LAB — CBC
HCT: 38.7 % (ref 36.0–46.0)
Hemoglobin: 12.4 g/dL (ref 12.0–15.0)
MCH: 29.8 pg (ref 26.0–34.0)
MCHC: 32 g/dL (ref 30.0–36.0)
MCV: 93 fL (ref 80.0–100.0)
Platelets: 301 10*3/uL (ref 150–400)
RBC: 4.16 MIL/uL (ref 3.87–5.11)
RDW: 12.8 % (ref 11.5–15.5)
WBC: 8.1 10*3/uL (ref 4.0–10.5)
nRBC: 0 % (ref 0.0–0.2)

## 2022-11-13 LAB — COMPREHENSIVE METABOLIC PANEL
ALT: 11 U/L (ref 0–44)
AST: 19 U/L (ref 15–41)
Albumin: 3.5 g/dL (ref 3.5–5.0)
Alkaline Phosphatase: 67 U/L (ref 38–126)
Anion gap: 9 (ref 5–15)
BUN: 16 mg/dL (ref 8–23)
CO2: 23 mmol/L (ref 22–32)
Calcium: 8.6 mg/dL — ABNORMAL LOW (ref 8.9–10.3)
Chloride: 106 mmol/L (ref 98–111)
Creatinine, Ser: 1.02 mg/dL — ABNORMAL HIGH (ref 0.44–1.00)
GFR, Estimated: >60 mL/min (ref >60)
Glucose, Bld: 171 mg/dL — ABNORMAL HIGH (ref 70–99)
Potassium: 3.6 mmol/L (ref 3.5–5.1)
Sodium: 138 mmol/L (ref 135–145)
Total Bilirubin: 0.7 mg/dL (ref 0.3–1.2)
Total Protein: 6.5 g/dL (ref 6.5–8.1)

## 2022-11-13 LAB — HEMOGLOBIN A1C
Hgb A1c MFr Bld: 6.8 % — ABNORMAL HIGH (ref 4.8–5.6)
Mean Plasma Glucose: 148.46 mg/dL

## 2022-11-13 LAB — I-STAT CG4 LACTIC ACID, ED: Lactic Acid, Venous: 2.2 mmol/L (ref 0.5–1.9)

## 2022-11-13 LAB — PROTIME-INR
INR: 1 (ref 0.8–1.2)
Prothrombin Time: 13.6 s (ref 11.4–15.2)

## 2022-11-13 LAB — LIPID PANEL
Cholesterol: 175 mg/dL (ref 0–200)
HDL: 41 mg/dL (ref >40)
LDL Cholesterol: 106 mg/dL — ABNORMAL HIGH (ref 0–99)
Total CHOL/HDL Ratio: 4.3 {ratio}
Triglycerides: 141 mg/dL (ref <150)
VLDL: 28 mg/dL (ref 0–40)

## 2022-11-13 LAB — APTT: aPTT: 35 s (ref 24–36)

## 2022-11-13 LAB — GLUCOSE, CAPILLARY: Glucose-Capillary: 126 mg/dL — ABNORMAL HIGH (ref 70–99)

## 2022-11-13 LAB — TROPONIN I (HIGH SENSITIVITY): Troponin I (High Sensitivity): 149 ng/L (ref <18)

## 2022-11-13 SURGERY — CORONARY/GRAFT ACUTE MI REVASCULARIZATION
Anesthesia: LOCAL

## 2022-11-13 MED ORDER — TIROFIBAN (AGGRASTAT) BOLUS VIA INFUSION
INTRAVENOUS | Status: DC | PRN
  Administered 2022-11-13: 2415 ug via INTRAVENOUS

## 2022-11-13 MED ORDER — HEPARIN SODIUM (PORCINE) 5000 UNIT/ML IJ SOLN
4000.0000 [IU] | Freq: Once | INTRAMUSCULAR | Status: AC
Start: 2022-11-13 — End: 2022-11-13
  Administered 2022-11-13: 4000 [IU] via INTRAVENOUS
  Filled 2022-11-13: qty 1

## 2022-11-13 MED ORDER — TICAGRELOR 90 MG PO TABS
ORAL_TABLET | ORAL | Status: AC
  Filled 2022-11-13: qty 2

## 2022-11-13 MED ORDER — MIDAZOLAM HCL 2 MG/2ML IJ SOLN
INTRAMUSCULAR | Status: DC | PRN
  Administered 2022-11-13: 2 mg via INTRAVENOUS
  Administered 2022-11-13: 1 mg via INTRAVENOUS

## 2022-11-13 MED ORDER — LABETALOL HCL 5 MG/ML IV SOLN
10.0000 mg | INTRAVENOUS | Status: AC | PRN
  Administered 2022-11-13: 10 mg via INTRAVENOUS
  Filled 2022-11-13 (×2): qty 4

## 2022-11-13 MED ORDER — NITROGLYCERIN 0.4 MG SL SUBL
0.4000 mg | SUBLINGUAL_TABLET | SUBLINGUAL | Status: DC | PRN
  Filled 2022-11-13: qty 1

## 2022-11-13 MED ORDER — CHLORHEXIDINE GLUCONATE CLOTH 2 % EX PADS
6.0000 | MEDICATED_PAD | Freq: Every day | CUTANEOUS | Status: DC
  Administered 2022-11-14: 6 via TOPICAL

## 2022-11-13 MED ORDER — VERAPAMIL HCL 2.5 MG/ML IV SOLN
INTRAVENOUS | Status: AC
  Filled 2022-11-13: qty 2

## 2022-11-13 MED ORDER — SODIUM CHLORIDE 0.9% FLUSH: 3.0000 mL | INTRAVENOUS | Status: DC | PRN

## 2022-11-13 MED ORDER — VERAPAMIL HCL 2.5 MG/ML IV SOLN
INTRAVENOUS | Status: DC | PRN
  Administered 2022-11-13: 10 mL via INTRA_ARTERIAL

## 2022-11-13 MED ORDER — TICAGRELOR 90 MG PO TABS
ORAL_TABLET | ORAL | Status: DC | PRN
  Administered 2022-11-13: 180 mg via ORAL

## 2022-11-13 MED ORDER — GABAPENTIN 100 MG PO CAPS
100.0000 mg | ORAL_CAPSULE | Freq: Two times a day (BID) | ORAL | Status: DC
  Administered 2022-11-14: 100 mg via ORAL
  Filled 2022-11-13: qty 1

## 2022-11-13 MED ORDER — TICAGRELOR 90 MG PO TABS
90.0000 mg | ORAL_TABLET | Freq: Two times a day (BID) | ORAL | Status: DC
  Administered 2022-11-14: 90 mg via ORAL
  Filled 2022-11-13: qty 1

## 2022-11-13 MED ORDER — FLUTICASONE PROPIONATE 50 MCG/ACT NA SUSP
2.0000 | Freq: Every day | NASAL | Status: DC
  Administered 2022-11-14: 2 via NASAL
  Filled 2022-11-13: qty 16

## 2022-11-13 MED ORDER — ORAL CARE MOUTH RINSE: 15.0000 mL | OROMUCOSAL | Status: DC | PRN

## 2022-11-13 MED ORDER — LIDOCAINE HCL (PF) 1 % IJ SOLN
INTRAMUSCULAR | Status: AC
  Filled 2022-11-13: qty 30

## 2022-11-13 MED ORDER — FENTANYL CITRATE (PF) 100 MCG/2ML IJ SOLN
INTRAMUSCULAR | Status: DC | PRN
  Administered 2022-11-13 (×2): 25 ug via INTRAVENOUS

## 2022-11-13 MED ORDER — MIDAZOLAM HCL 2 MG/2ML IJ SOLN
INTRAMUSCULAR | Status: AC
  Filled 2022-11-13: qty 2

## 2022-11-13 MED ORDER — ASPIRIN 81 MG PO CHEW
81.0000 mg | CHEWABLE_TABLET | Freq: Every day | ORAL | Status: DC
  Administered 2022-11-14: 81 mg via ORAL
  Filled 2022-11-13: qty 1

## 2022-11-13 MED ORDER — HEPARIN (PORCINE) IN NACL 1000-0.9 UT/500ML-% IV SOLN
INTRAVENOUS | Status: DC | PRN
  Administered 2022-11-13 (×2): 500 mL

## 2022-11-13 MED ORDER — HEPARIN SODIUM (PORCINE) 1000 UNIT/ML IJ SOLN
INTRAMUSCULAR | Status: DC | PRN
  Administered 2022-11-13: 5000 [IU] via INTRAVENOUS

## 2022-11-13 MED ORDER — SODIUM CHLORIDE 0.9% FLUSH
3.0000 mL | Freq: Two times a day (BID) | INTRAVENOUS | Status: DC
  Administered 2022-11-14 (×2): 3 mL via INTRAVENOUS

## 2022-11-13 MED ORDER — ATORVASTATIN CALCIUM 80 MG PO TABS
80.0000 mg | ORAL_TABLET | Freq: Every day | ORAL | Status: DC
  Administered 2022-11-14: 80 mg via ORAL
  Filled 2022-11-13: qty 1

## 2022-11-13 MED ORDER — HYDRALAZINE HCL 20 MG/ML IJ SOLN: 10.0000 mg | INTRAMUSCULAR | Status: AC | PRN

## 2022-11-13 MED ORDER — LOSARTAN POTASSIUM 50 MG PO TABS
50.0000 mg | ORAL_TABLET | Freq: Every day | ORAL | Status: DC
  Administered 2022-11-14: 50 mg via ORAL
  Filled 2022-11-13: qty 1

## 2022-11-13 MED ORDER — ONDANSETRON HCL 4 MG/2ML IJ SOLN: 4.0000 mg | Freq: Four times a day (QID) | INTRAMUSCULAR | Status: DC | PRN

## 2022-11-13 MED ORDER — SODIUM CHLORIDE 0.9 % IV SOLN: 250.0000 mL | INTRAVENOUS | Status: DC | PRN

## 2022-11-13 MED ORDER — SODIUM CHLORIDE 0.9 % IV SOLN: INTRAVENOUS | Status: DC

## 2022-11-13 MED ORDER — SODIUM CHLORIDE 0.9 % IV SOLN
INTRAVENOUS | Status: AC
Start: 2022-11-13 — End: 2022-11-14

## 2022-11-13 MED ORDER — TIROFIBAN HCL IN NACL 5-0.9 MG/100ML-% IV SOLN
INTRAVENOUS | Status: AC | PRN
  Administered 2022-11-13: .15 ug/kg/min via INTRAVENOUS

## 2022-11-13 MED ORDER — CITALOPRAM HYDROBROMIDE 20 MG PO TABS: 20.0000 mg | ORAL_TABLET | Freq: Every day | ORAL | Status: DC

## 2022-11-13 MED ORDER — NITROGLYCERIN 1 MG/10 ML FOR IR/CATH LAB
INTRA_ARTERIAL | Status: DC | PRN
  Administered 2022-11-13 (×2): 100 ug via INTRACORONARY

## 2022-11-13 MED ORDER — HEPARIN SODIUM (PORCINE) 1000 UNIT/ML IJ SOLN
INTRAMUSCULAR | Status: AC
  Filled 2022-11-13: qty 10

## 2022-11-13 MED ORDER — LIDOCAINE HCL (PF) 1 % IJ SOLN
INTRAMUSCULAR | Status: DC | PRN
  Administered 2022-11-13: 2 mL

## 2022-11-13 MED ORDER — IOHEXOL 350 MG/ML SOLN
INTRAVENOUS | Status: DC | PRN
  Administered 2022-11-13: 110 mL via INTRA_ARTERIAL

## 2022-11-13 MED ORDER — FENTANYL CITRATE (PF) 100 MCG/2ML IJ SOLN
INTRAMUSCULAR | Status: AC
  Filled 2022-11-13: qty 2

## 2022-11-13 MED ORDER — NITROGLYCERIN 1 MG/10 ML FOR IR/CATH LAB
INTRA_ARTERIAL | Status: AC
  Filled 2022-11-13: qty 10

## 2022-11-13 SURGICAL SUPPLY — 15 items
BALLN SAPPHIRE 2.5X12 (BALLOONS) ×1
BALLN ~~LOC~~ EMERGE MR 3.25X15 (BALLOONS) ×1
BALLOON SAPPHIRE 2.5X12 (BALLOONS) IMPLANT
BALLOON ~~LOC~~ EMERGE MR 3.25X15 (BALLOONS) IMPLANT
CATH 5FR JL3.5 JR4 ANG PIG MP (CATHETERS) IMPLANT
CATH VISTA GUIDE 6FR JR4 (CATHETERS) IMPLANT
DEVICE RAD COMP TR BAND LRG (VASCULAR PRODUCTS) IMPLANT
GLIDESHEATH SLEND SS 6F .021 (SHEATH) IMPLANT
GUIDEWIRE INQWIRE 1.5J.035X260 (WIRE) IMPLANT
INQWIRE 1.5J .035X260CM (WIRE) ×1
KIT ENCORE 26 ADVANTAGE (KITS) IMPLANT
PACK CARDIAC CATHETERIZATION (CUSTOM PROCEDURE TRAY) ×1 IMPLANT
SET ATX-X65L (MISCELLANEOUS) IMPLANT
STENT SYNERGY XD 3.0X32 (Permanent Stent) IMPLANT
WIRE ASAHI PROWATER 180CM (WIRE) IMPLANT

## 2022-11-13 NOTE — ED Triage Notes (Addendum)
Pt in from home via [facility] with sharp cp that began after shopping around 3:30pm. Pt states she became diaphoretic in the store, went to the car and felt a sharp, cramping pain to central chest. Pain has been constant since. EMS states elevation in III and aVF, was given 324mg  ASA and 2NTG given en route. Pain decreased with meds, pt also reports frequent coughing while lying flat the past 2 nights

## 2022-11-13 NOTE — ED Notes (Signed)
Cardiologist at bedside.  

## 2022-11-13 NOTE — Progress Notes (Signed)
Brief cardiology note:  Cynthia Patton is a 62 y.o. yo F with a pmhx of DM (diet controlled), HTN, HLD who presents with chest pain since 3:30PM. Pt reports crushing chest pain while she was watching TV. The pain acutely worsened and at 5PM she called EMS. En route she was given ASA 324 mg and NTG x3.  On arrival to ED, she was noted to be hypertensive to BP 200/80. Her EKG shows inferior ST elevations which are new compared to prior EKG on 05/2022. She reports her chest pain has decreased to 2/10 with NTG. Troponins are pending. She is not on blood thinners at home. She has no prior history of cardiac catheterization and no prior ECHO in the system.  She is pending evaluation by interventional cardiology attending.  Willette Alma MD MPH [facility]

## 2022-11-13 NOTE — ED Provider Notes (Signed)
[place] EMERGENCY DEPARTMENT AT [facility] Provider Note   CSN: [id number] Arrival date & time: 11/13/22  1909     History  Chief Complaint  Patient presents with   Chest Pain   STEMI    Cynthia Patton is a 62 y.o. female.  Patient is a 62 year old female with a past medical history of hypertension and diabetes presenting to the emergency department with chest pain.  Patient states around 5:30 PM she started to feel some cramping in her chest that felt like a muscle cramp.  She states that over the next hour and a half the pain increased and worsened.  Patient denies any nausea or vomiting.  EMS was called and was noted to have elevation in 3 and aVF and was given aspirin and nitro and route.  She states that her pain did improve with the nitro but is slowly worsening.  She denies any known cardiac history.  The history is provided by the patient.  Chest Pain      Home Medications Prior to Admission medications   Medication Sig Start Date End Date Taking? Authorizing Provider  albuterol (PROVENTIL) (2.5 MG/3ML) 0.083% nebulizer solution Take 3 mLs (2.5 mg total) by nebulization every 4 (four) hours as needed for wheezing or shortness of breath. 02/21/20 02/20/21  Barbette Merino, NP  blood glucose meter kit and supplies KIT Dispense based on patient and insurance preference. Use up to four times daily as directed. (FOR ICD-9 250.00, 250.01). 07/07/18   Mike Gip, FNP  citalopram (CELEXA) 20 MG tablet Take 1 tablet by mouth once daily 11/20/21   Ivonne Andrew, NP  desonide (DESOWEN) 0.05 % cream Apply topically 2 (two) times daily. 09/10/21   Ivonne Andrew, NP  diltiazem (DILT-XR) 180 MG 24 hr capsule Take 1 capsule (180 mg total) by mouth daily. 01/25/22   Paseda, Baird Kay, FNP  fluticasone (FLONASE) 50 MCG/ACT nasal spray Place 2 sprays into both nostrils daily. 04/09/21   Ivonne Andrew, NP  gabapentin (NEURONTIN) 100 MG capsule Take 1 capsule (100 mg  total) by mouth 2 (two) times daily. 03/25/20   Kallie Locks, FNP  ibuprofen (ADVIL) 800 MG tablet Take 1 tablet (800 mg total) by mouth every 8 (eight) hours as needed. 11/07/20   Barbette Merino, NP  levalbuterol [facility] HFA) 45 MCG/ACT inhaler Inhale 1 puff into the lungs every 4 (four) hours as needed for wheezing. 10/03/20 10/03/21  Barbette Merino, NP  levothyroxine (SYNTHROID) 175 MCG tablet Take by mouth. 01/14/20   [provider]  losartan (COZAAR) 50 MG tablet Take 50 mg by mouth daily. 07/09/19   [provider]  meclizine (ANTIVERT) 25 MG tablet Take 1 tablet (25 mg total) by mouth 2 (two) times daily as needed for dizziness. 05/21/21   Al Decant, PA-C  mometasone (NASONEX) 50 MCG/ACT nasal spray Place 2 sprays into the nose daily. 10/14/16   Massie Maroon, FNP  ONETOUCH VERIO test strip USE UP TO 4 TIMES DAILY AS DIRECTED 10/22/18   [provider]  Semaglutide, 1 MG/DOSE, 2 MG/1.5ML SOPN Inject into the skin. Patient not taking: Reported on 09/10/2021 01/14/20   [provider]  triamcinolone cream (KENALOG) 0.1 % Apply 1 Application topically 2 (two) times daily. Patient not taking: Reported on 09/10/2021 06/30/21   de [place], Buren Kos, MD      Allergies    Floxin [ocuflox], Ofloxacin, and Percocet [oxycodone-acetaminophen]    Review  of Systems   Review of Systems  Cardiovascular:  Positive for chest pain.    Physical Exam Updated Vital Signs BP (!) 200/80   Pulse 97   Temp 98 F (36.7 C) (Oral)   Resp 16   Ht 5\' 3"  (1.6 m)   Wt 96.6 kg   SpO2 100%   BMI 37.73 kg/m  Physical Exam Vitals and nursing note reviewed.  Constitutional:      General: She is not in acute distress.    Appearance: She is well-developed. She is obese.  HENT:     Head: Normocephalic and atraumatic.  Eyes:     Extraocular Movements: Extraocular movements intact.  Cardiovascular:     Rate and Rhythm: Normal rate and regular rhythm.     Heart  sounds: Normal heart sounds.  Pulmonary:     Effort: Pulmonary effort is normal.     Breath sounds: Normal breath sounds.  Abdominal:     Palpations: Abdomen is soft.     Tenderness: There is no abdominal tenderness.  Musculoskeletal:        General: Normal range of motion.     Cervical back: Normal range of motion and neck supple.     Right lower leg: No edema.     Left lower leg: No edema.  Skin:    General: Skin is warm and dry.  Neurological:     General: No focal deficit present.     Mental Status: She is alert and oriented to person, place, and time.  Psychiatric:        Mood and Affect: Mood normal.        Behavior: Behavior normal.     ED Results / Procedures / Treatments   Labs (all labs ordered are listed, but only abnormal results are displayed) Labs Reviewed  HEMOGLOBIN A1C - Abnormal; Notable for the following components:      Result Value   Hgb A1c MFr Bld 6.8 (*)    All other components within normal limits  COMPREHENSIVE METABOLIC PANEL - Abnormal; Notable for the following components:   Glucose, Bld 171 (*)    Creatinine, Ser 1.02 (*)    Calcium 8.6 (*)    All other components within normal limits  I-STAT CG4 LACTIC ACID, ED - Abnormal; Notable for the following components:   Lactic Acid, Venous 2.2 (*)    All other components within normal limits  CBC  PROTIME-INR  APTT  LIPID PANEL  TROPONIN I (HIGH SENSITIVITY)    EKG EKG Interpretation Date/Time:  Saturday November 13 2022 19:13:43 EDT Ventricular Rate:  100 PR Interval:  150 QRS Duration:  97 QT Interval:  345 QTC Calculation: 445 R Axis:   71  Text Interpretation: Sinus tachycardia Ventricular premature complex Aberrant conduction of SV complex(es) Probable left atrial enlargement Interpretation limited secondary to artifact No significant change since last tracing Confirmed by Elayne Snare ([id number]) on 11/13/2022 7:19:26 PM  Radiology No results found.  Procedures .Critical  Care  Performed by: Rexford Maus, DO Authorized by: Rexford Maus, DO   Critical care provider statement:    Critical care time (minutes):  40   Critical care time was exclusive of:  Separately billable procedures and treating other patients   Critical care was necessary to treat or prevent imminent or life-threatening deterioration of the following conditions:  Cardiac failure   Critical care was time spent personally by me on the following activities:  Development of treatment plan with patient or  surrogate, discussions with primary provider, evaluation of patient's response to treatment, examination of patient, obtaining history from patient or surrogate, ordering and performing treatments and interventions, ordering and review of laboratory studies, ordering and review of radiographic studies, pulse oximetry, re-evaluation of patient's condition and review of old charts   I assumed direction of critical care for this patient from another provider in my specialty: no     Care discussed with: admitting provider       Medications Ordered in ED Medications  0.9 %  sodium chloride infusion ( Intravenous New Bag/Given 11/13/22 1943)  nitroGLYCERIN (NITROSTAT) SL tablet 0.4 mg ( Sublingual MAR Hold 11/13/22 2010)  nitroGLYCERIN 100 mcg/mL intra-arterial injection (has no administration in time range)  heparin injection 4,000 Units (4,000 Units Intravenous Given 11/13/22 1944)    ED Course/ Medical Decision Making/ A&P                                 Medical Decision Making This patient presents to the ED with chief complaint(s) of chest pain with pertinent past medical history of hypertension, diabetes which further complicates the presenting complaint. The complaint involves an extensive differential diagnosis and also carries with it a high risk of complications and morbidity.    The differential diagnosis includes ACS, arrhythmia, anemia, pneumonia, pneumothorax and floor  edema, pleural effusion, gastritis, GERD  Additional history obtained: Additional history obtained from EMS  Records reviewed N/A  ED Course and Reassessment: On patient's arrival she was hypertensive and otherwise hemodynamically stable in no acute distress.  Initial EKG on arrival was limited interpretation secondary to artifact and was repeated when she was in the room.  Repeat EKG did show ST elevation in lead III and aVF with reciprocal depression in 1 and aVL.  STEMI alert was called.  The patient was placed on pads and IV access was obtained.  The patient was given a bolus of heparin.  She was given aspirin and route.  She was ordered additional nitro for her chest pain.  Cardiology fellow evaluated the patient at bedside and interventional attending Dr. Excell Seltzer recommended admission to Cath Lab  Independent labs interpretation:  The following labs were independently interpreted: Elevated lactic otherwise within normal range  Independent visualization of imaging: - Pending  Consultation: - Consulted or discussed management/test interpretation w/ external professional: cardiology  Consideration for admission or further workup: patient requires admission for STEMI Social Determinants of health: N/A    Amount and/or Complexity of Data Reviewed Labs: ordered. Radiology: ordered.  Risk Prescription drug management.          Final Clinical Impression(s) / ED Diagnoses Final diagnoses:  ST elevation myocardial infarction (STEMI), unspecified artery [facility])    Rx / DC Orders ED Discharge Orders     None         Rexford Maus, DO 11/13/22 2012

## 2022-11-13 NOTE — H&P (Addendum)
Cardiology Admission History and Physical   Patient ID: Cynthia Patton MRN: [id number]; DOB: October 25, 1960   Admission date: 11/13/2022  PCP:  Ivonne Andrew, NP   [facility] Providers Cardiologist:  None       Chief Complaint:  chest pain  Patient Profile:   Cynthia Patton is a 62 y.o. female with pmhx diet-controlled DM, HTN, HLD who is being seen 11/13/2022 for the evaluation of chest pain.  History of Present Illness:   Cynthia Patton is a 62 y.o. yo F with a pmhx of DM (diet controlled), HTN, HLD who presents with chest pain since 3:30PM. Pt reports crushing chest pain while she was watching TV this afternoon. The pain acutely worsened until she called EMS at [facility]. She was given ASA 324 mg en route and NTG x3. She denies radiation of chest pain, orthopnea, PND, LE edema, palpitations.  On arrival to ED, she was noted to be hypertensive to BP 200/80. Her EKG shows inferior ST elevations which are new compared to prior EKG on 05/2022. She reports her chest pain has decreased to 2/10 with NTG. Her troponins are pending. Admission labs shows Cr 1.02 with otherwise normal CMP. CBC shows Hgb 12.4 and plt 301. Lactate of 2.2.  Pt reports she is not on blood thinners at home. She denies history of MI, cardiac surgery or "heart problems". She has never had a LHC or prior ECHO. She has diabetes for which she was formerly on Ozempic until earlier this year when it was stopped because there was a Geneticist, molecular. Otherwise, she takes diltiazem and losartan for hypertension.  Given her crushing chest pain, inferior ST elevations on EKG and cardiac risk factors, she will go to the cath lab for treatment of inferior STEMI. At the request of the patient, I discussed the plan to go to the cath lab with her aunt Tora Duck over the phone.  Past Medical History:  Diagnosis Date   Acute low back pain 03/25/2020   Anxiety    Cancer (HCC) 2000?   thyroid cancer   Depression    Diabetes mellitus  without complication (HCC)    Hyperlipidemia    Hypertension    Thyroid disease     Past Surgical History:  Procedure Laterality Date   ABDOMINAL HYSTERECTOMY     BREAST CYST EXCISION Left    CHOLECYSTECTOMY     INCISE AND DRAIN ABCESS     on right leg    THYROIDECTOMY       Medications Prior to Admission: Prior to Admission medications   Medication Sig Start Date End Date Taking? Authorizing Provider  albuterol (PROVENTIL) (2.5 MG/3ML) 0.083% nebulizer solution Take 3 mLs (2.5 mg total) by nebulization every 4 (four) hours as needed for wheezing or shortness of breath. 02/21/20 02/20/21  Barbette Merino, NP  blood glucose meter kit and supplies KIT Dispense based on patient and insurance preference. Use up to four times daily as directed. (FOR ICD-9 250.00, 250.01). 07/07/18   Mike Gip, FNP  citalopram (CELEXA) 20 MG tablet Take 1 tablet by mouth once daily 11/20/21   Ivonne Andrew, NP  desonide (DESOWEN) 0.05 % cream Apply topically 2 (two) times daily. 09/10/21   Ivonne Andrew, NP  diltiazem (DILT-XR) 180 MG 24 hr capsule Take 1 capsule (180 mg total) by mouth daily. 01/25/22   Paseda, Baird Kay, FNP  fluticasone (FLONASE) 50 MCG/ACT nasal spray Place 2 sprays into both nostrils daily. 04/09/21  Ivonne Andrew, NP  gabapentin (NEURONTIN) 100 MG capsule Take 1 capsule (100 mg total) by mouth 2 (two) times daily. 03/25/20   Kallie Locks, FNP  ibuprofen (ADVIL) 800 MG tablet Take 1 tablet (800 mg total) by mouth every 8 (eight) hours as needed. 11/07/20   Barbette Merino, NP  levalbuterol [facility] HFA) 45 MCG/ACT inhaler Inhale 1 puff into the lungs every 4 (four) hours as needed for wheezing. 10/03/20 10/03/21  Barbette Merino, NP  levothyroxine (SYNTHROID) 175 MCG tablet Take by mouth. 01/14/20   [provider]  losartan (COZAAR) 50 MG tablet Take 50 mg by mouth daily. 07/09/19   [provider]  meclizine (ANTIVERT) 25 MG tablet Take 1 tablet (25 mg  total) by mouth 2 (two) times daily as needed for dizziness. 05/21/21   Al Decant, PA-C  mometasone (NASONEX) 50 MCG/ACT nasal spray Place 2 sprays into the nose daily. 10/14/16   Massie Maroon, FNP  ONETOUCH VERIO test strip USE UP TO 4 TIMES DAILY AS DIRECTED 10/22/18   [provider]  Semaglutide, 1 MG/DOSE, 2 MG/1.5ML SOPN Inject into the skin. Patient not taking: Reported on 09/10/2021 01/14/20   [provider]  triamcinolone cream (KENALOG) 0.1 % Apply 1 Application topically 2 (two) times daily. Patient not taking: Reported on 09/10/2021 06/30/21   de [place], Buren Kos, MD     Allergies:    Allergies  Allergen Reactions   Floxin [Ocuflox] Other (See Comments)    Disoriented   Ofloxacin Other (See Comments)    Disoriented   Percocet [Oxycodone-Acetaminophen]     Social History:   Social History   Socioeconomic History   Marital status: Single    Spouse name: Not on file   Number of children: Not on file   Years of education: Not on file   Highest education level: Not on file  Occupational History   Not on file  Tobacco Use   Smoking status: Every Day    Current packs/day: 0.50    Types: Cigarettes   Smokeless tobacco: Never  Vaping Use   Vaping status: Never Used  Substance and Sexual Activity   Alcohol use: No   Drug use: No   Sexual activity: Not Currently  Other Topics Concern   Not on file  Social History Narrative   Not on file   Social Determinants of Health   Financial Resource Strain: Not on file  Food Insecurity: Not on file  Transportation Needs: Not on file  Physical Activity: Not on file  Stress: Not on file  Social Connections: Not on file  Intimate Partner Violence: Not on file    Family History:   The patient's family history includes Heart disease in her brother; Hypertension in her mother and sister; Kidney disease in her mother. There is no history of Colon cancer, Colon polyps, Esophageal cancer, Rectal  cancer, or Stomach cancer.    ROS:  Please see the history of present illness.  All other ROS reviewed and negative.     Physical Exam/Data:   Vitals:   11/13/22 1930 11/13/22 1945 11/13/22 1955 11/13/22 2021  BP: (!) 169/70 (!) 200/80    Pulse: 87 97    Resp: 20 16    Temp:      TempSrc:      SpO2: 100% 100%  100%  Weight:   96.6 kg   Height:   5\' 3"  (1.6 m)    No intake or output  data in the 24 hours ending 11/13/22 2022    11/13/2022    7:55 PM 11/13/2022    7:20 PM 09/10/2021    8:20 AM  Last 3 Weights  Weight (lbs) 213 lb 223 lb 15.8 oz 224 lb  Weight (kg) 96.616 kg 101.6 kg 101.606 kg     Body mass index is 37.73 kg/m.  General:  Well nourished, well developed, in mild distress HEENT: normal Neck: no JVD Vascular: No carotid bruits; Distal pulses 2+ bilaterally   Cardiac:  normal S1, S2; RRR; no murmur  Lungs:  clear to auscultation bilaterally, no wheezing, rhonchi or rales  Abd: soft, nontender, no hepatomegaly  Ext: no edema Musculoskeletal:  No deformities, BUE and BLE strength normal and equal Skin: warm and dry  Neuro:  CNs 2-12 intact, no focal abnormalities noted Psych:  Normal affect   EKG:  The ECG that was done and was personally reviewed and demonstrates inferior ST elevations.  Relevant CV Studies: ECHO: pending  Laboratory Data:  High Sensitivity Troponin:  No results for input(s): "TROPONINIHS" in the last 720 hours.    Chemistry Recent Labs  Lab 11/13/22 1921  NA 138  K 3.6  CL 106  CO2 23  GLUCOSE 171*  BUN 16  CREATININE 1.02*  CALCIUM 8.6*  GFRNONAA >60  ANIONGAP 9    Recent Labs  Lab 11/13/22 1921  PROT 6.5  ALBUMIN 3.5  AST 19  ALT 11  ALKPHOS 67  BILITOT 0.7   Lipids No results for input(s): "CHOL", "TRIG", "HDL", "LABVLDL", "LDLCALC", "CHOLHDL" in the last 168 hours. Hematology Recent Labs  Lab 11/13/22 1921  WBC 8.1  RBC 4.16  HGB 12.4  HCT 38.7  MCV 93.0  MCH 29.8  MCHC 32.0  RDW 12.8  PLT 301    Radiology/Studies:  DG Chest Portable 1 View  Result Date: 11/13/2022 CLINICAL DATA:  Chest pain EXAM: PORTABLE CHEST 1 VIEW COMPARISON:  Chest x-ray 11/13/2022 FINDINGS: The heart and mediastinal contours are unchanged. Aortic calcification. No focal consolidation. Chronic coarsened interstitial markings with no overt pulmonary edema. No pleural effusion. No pneumothorax. No acute osseous abnormality. IMPRESSION: 1. Bronchitic changes. 2.  Aortic Atherosclerosis (ICD10-I70.0). Electronically Signed   By: Tish Frederickson M.D.   On: 11/13/2022 20:13    LHC 11/13/22   Mid RCA lesion is 100% stenosed.   Prox LAD to Mid LAD lesion is 30% stenosed.   Mid LM to Dist LM lesion is 25% stenosed.   Mid Cx lesion is 40% stenosed.   A drug-eluting stent was successfully placed using a STENT SYNERGY XD 3.0X32.   Post intervention, there is a 0% residual stenosis.   LV end diastolic pressure is moderately elevated.   Recommend uninterrupted dual antiplatelet therapy with Aspirin 81mg  daily and Ticagrelor 90mg  twice daily for a minimum of 12 months (ACS-Class I recommendation).   1.  Acute inferior MI secondary to mid RCA occlusion, treated successfully with primary PCI using a 3.0 x 32 mm Synergy DES.  Baseline 100% stenosis, TIMI 0 flow.  Post-PCI 0% residual stenosis and TIMI-3 flow. 2.  Mild nonobstructive plaquing of the left main, LAD, and left circumflex, without any high-grade stenoses in the left coronary distribution 3.  Moderately elevated LVEDP 24 mmHg   Assessment and Plan:  Mrs. Romito is a 62 y.o. yo F with a pmhx of DM (diet controlled), HTN, HLD who presents with chest pain since 3:30PM and inferior ST elevations c/f inferior STEMI. LHC with  100% mid RCA occlusion s/p DES.  #. Inferior STEMI s/p DES to mRCA Crushing chest pain since 3:30PM. Troponins on admission were 129. S/p ASA 324 and NTG x3.  No prior cardiac history, LHC or ECHO. Her cardiac risk factors include HTN, HLD and DMII.  [place] Class I. LHC showed acute inferior MI 2/2 mRCA s/p DES. - Start ticagrelor 90 mg BID, continue ASA 81 mg every day starting tomorrow. Uninterrupted DAPT for 12 months. - atorvastatin 80 mg qD - ACE/ARB: restart home losartan 50 mg in AM - ECHO ordered to assess LV function - zofran prn - SL NTG prn - Diet: modified carb - cardiac rehab ordered  #. DM, diet controlled Last A1c 6.8 on 11/13/22. Formerly on ozempic but stopped due to pharmacy shortage - Can continue ozempic at home - SSI while hospitalized  #. HTN On diltiazem 180 mg ER and losartan at home - Restart home losartan - Did not restart home diltiazem 180 mg ER in the setting of bradycardia - Likely will need addition of another anti-hypertensive agent, but will see how BP settles out after acute MI; - prn IV hydralazine q10 min prn  #. HLD - high intensity statin as above - f/u Lp(a)  #. Hypothyroidism - levothyroxine 175 mcg at home (verify dose in AM prior to starting) - f/u TSH  #. Anxiety - home celexa 20 mg qD  Risk Assessment/Risk Scores:    TIMI Risk Score for ST  Elevation MI:   The patient's TIMI risk score is 1, which indicates a 1.6% risk of all cause mortality at 30 days.    Code Status: Full Code  Severity of Illness: The appropriate patient status for this patient is INPATIENT. Inpatient status is judged to be reasonable and necessary in order to provide the required intensity of service to ensure the patient's safety. The patient's presenting symptoms, physical exam findings, and initial radiographic and laboratory data in the context of their chronic comorbidities is felt to place them at high risk for further clinical deterioration. Furthermore, it is not anticipated that the patient will be medically stable for discharge from the hospital within 2 midnights of admission.   * I certify that at the point of admission it is my clinical judgment that the patient will require inpatient  hospital care spanning beyond 2 midnights from the point of admission due to high intensity of service, high risk for further deterioration and high frequency of surveillance required.*   For questions or updates, please contact [facility] Please consult [email or web address] for contact info under     Signed, Willette Alma, MD MPH [facility] 11/13/2022 8:22 PM

## 2022-11-14 ENCOUNTER — Inpatient Hospital Stay (HOSPITAL_COMMUNITY): Payer: Commercial Managed Care - HMO

## 2022-11-14 ENCOUNTER — Telehealth: Payer: Self-pay | Admitting: Student

## 2022-11-14 DIAGNOSIS — I2111 ST elevation (STEMI) myocardial infarction involving right coronary artery: Secondary | ICD-10-CM | POA: Diagnosis not present

## 2022-11-14 DIAGNOSIS — Z720 Tobacco use: Secondary | ICD-10-CM | POA: Insufficient documentation

## 2022-11-14 DIAGNOSIS — I2510 Atherosclerotic heart disease of native coronary artery without angina pectoris: Secondary | ICD-10-CM | POA: Insufficient documentation

## 2022-11-14 DIAGNOSIS — I25110 Atherosclerotic heart disease of native coronary artery with unstable angina pectoris: Secondary | ICD-10-CM

## 2022-11-14 LAB — BASIC METABOLIC PANEL
Anion gap: 13 (ref 5–15)
BUN: 13 mg/dL (ref 8–23)
CO2: 20 mmol/L — ABNORMAL LOW (ref 22–32)
Calcium: 8.3 mg/dL — ABNORMAL LOW (ref 8.9–10.3)
Chloride: 105 mmol/L (ref 98–111)
Creatinine, Ser: 0.89 mg/dL (ref 0.44–1.00)
GFR, Estimated: >60 mL/min (ref >60)
Glucose, Bld: 191 mg/dL — ABNORMAL HIGH (ref 70–99)
Potassium: 3.8 mmol/L (ref 3.5–5.1)
Sodium: 138 mmol/L (ref 135–145)

## 2022-11-14 LAB — TSH: TSH: 1.305 u[IU]/mL (ref 0.350–4.500)

## 2022-11-14 LAB — MRSA NEXT GEN BY PCR, NASAL: MRSA by PCR Next Gen: NOT DETECTED

## 2022-11-14 LAB — ECHOCARDIOGRAM COMPLETE
Area-P 1/2: 3.72 cm2
Calc EF: 55.8 %
Height: 63 in
S' Lateral: 3.45 cm
Single Plane A2C EF: 61 %
Single Plane A4C EF: 49.8 %
Weight: 3439.18 [oz_av]

## 2022-11-14 LAB — CBC
HCT: 36.9 % (ref 36.0–46.0)
Hemoglobin: 12.1 g/dL (ref 12.0–15.0)
MCH: 30.4 pg (ref 26.0–34.0)
MCHC: 32.8 g/dL (ref 30.0–36.0)
MCV: 92.7 fL (ref 80.0–100.0)
Platelets: 309 10*3/uL (ref 150–400)
RBC: 3.98 MIL/uL (ref 3.87–5.11)
RDW: 13 % (ref 11.5–15.5)
WBC: 9.8 10*3/uL (ref 4.0–10.5)
nRBC: 0 % (ref 0.0–0.2)

## 2022-11-14 LAB — TROPONIN I (HIGH SENSITIVITY): Troponin I (High Sensitivity): 21466 ng/L (ref <18)

## 2022-11-14 MED ORDER — NICOTINE 14 MG/24HR TD PT24: 14.0000 mg | MEDICATED_PATCH | TRANSDERMAL | 0 refills | Status: DC

## 2022-11-14 MED ORDER — TICAGRELOR 90 MG PO TABS: 90.0000 mg | ORAL_TABLET | Freq: Two times a day (BID) | ORAL | 11 refills | Status: DC

## 2022-11-14 MED ORDER — CARVEDILOL 3.125 MG PO TABS: 3.1250 mg | ORAL_TABLET | Freq: Two times a day (BID) | ORAL | 2 refills | Status: DC

## 2022-11-14 MED ORDER — ASPIRIN 81 MG PO CHEW: 81.0000 mg | CHEWABLE_TABLET | Freq: Every day | ORAL | 3 refills | Status: DC

## 2022-11-14 MED ORDER — ATORVASTATIN CALCIUM 80 MG PO TABS: 80.0000 mg | ORAL_TABLET | Freq: Every day | ORAL | 2 refills | Status: DC

## 2022-11-14 MED ORDER — NITROGLYCERIN 0.4 MG SL SUBL: 0.4000 mg | SUBLINGUAL_TABLET | SUBLINGUAL | 2 refills | Status: AC | PRN

## 2022-11-14 NOTE — Plan of Care (Signed)

## 2022-11-14 NOTE — Telephone Encounter (Signed)
   Transition of Care Follow-up Phone Call Request    Patient Name: Cynthia Patton Date of Birth: 1960-04-03 Date of Encounter: 11/14/2022  Primary Care Provider:  Ivonne Andrew, NP Primary Cardiologist:  None  [name] has been scheduled for a transition of care follow up appointment with a [facility] provider:  Marjie Skiff, PA-C, on 11/23/2022 at 2:45pm  Please reach out to [name] within 48 hours of discharge to confirm appointment and review transition of care protocol questionnaire. Anticipated discharge date: 11/14/2022  Corrin Parker, PA-C  11/14/2022, 1:20 PM

## 2022-11-14 NOTE — Discharge Instructions (Addendum)
Medication Changes: - START Aspirin 81mg  daily and Brilinta 90mg  twice daily. These medications are very important in helping keep the new stent in your heart open. - START Carvedilol (Coreg) 3.125mg  twice daily. - START Atorvastatin (Lipitor) 80mg  daily. - STOP Diltiazem. - Continue all other medications as listed elsewhere on your discharge paperwork. - Recommended avoiding NSAIDs (Ibuprofen, Advil, Motrin, Aleve, etc) given your heart disease. Recommended using over-the-counter Tylenol as needed for pain instead. - The Citalopram, Gabapentin, and Semaglutide have been discontinued because you said your were no longer taking these.   **The pharmacy closes at 6pm so please go and pick your medications up as soon as you leave the hospital.**  Post STEMI: NO HEAVY LIFTING X 4 WEEKS. NO SEXUAL ACTIVITY X 4 WEEKS. NO DRIVING X 1 WEEK. NO SOAKING BATHS, HOT TUBS, POOLS, ETC., X 7 DAYS.  Radial Site Care: Refer to this sheet in the next few weeks. These instructions provide you with information on caring for yourself after your procedure. Your caregiver may also give you more specific instructions. Your treatment has been planned according to current medical practices, but problems sometimes occur. Call your caregiver if you have any problems or questions after your procedure. HOME CARE INSTRUCTIONS You may shower the day after the procedure. Remove the bandage (dressing) and gently wash the site with plain soap and water. Gently pat the site dry.  Do not apply powder or lotion to the site.  Do not submerge the affected site in water for 3 to 5 days.  Inspect the site at least twice daily.  Do not flex or bend the affected arm for 24 hours.  No lifting over 5 pounds (2.3 kg) for 5 days after your procedure.  Do not drive home if you are discharged the same day of the procedure. Have someone else drive you.  What to expect: Any bruising will usually fade within 1 to 2 weeks.  Blood that  collects in the tissue (hematoma) may be painful to the touch. It should usually decrease in size and tenderness within 1 to 2 weeks.  SEEK IMMEDIATE MEDICAL CARE IF: You have unusual pain at the radial site.  You have redness, warmth, swelling, or pain at the radial site.  You have drainage (other than a small amount of blood on the dressing).  You have chills.  You have a fever or persistent symptoms for more than 72 hours.  You have a fever and your symptoms suddenly get worse.  Your arm becomes pale, cool, tingly, or numb.  You have heavy bleeding from the site. Hold pressure on the site.

## 2022-11-14 NOTE — Discharge Summary (Addendum)
Discharge Summary    Patient ID: Cynthia Patton MRN: [id number]; DOB: 03-Sep-1960  Admit date: 11/13/2022 Discharge date: 11/14/2022  PCP:  Ivonne Andrew, NP   [facility] Providers Cardiologist:  None        Discharge Diagnoses    Principal Problem:   STEMI involving right coronary artery [facility]) Active Problems:   CAD (coronary artery disease)   Controlled type 2 diabetes mellitus with complication, without long-term current use of insulin (HCC)   Essential hypertension   Hyperlipidemia   Tobacco abuse    Diagnostic Studies/Procedures    Left Cardiac Catheterization 11/13/2022:   Mid RCA lesion is 100% stenosed.   Prox LAD to Mid LAD lesion is 30% stenosed.   Mid LM to Dist LM lesion is 25% stenosed.   Mid Cx lesion is 40% stenosed.   A drug-eluting stent was successfully placed using a STENT SYNERGY XD 3.0X32.   Post intervention, there is a 0% residual stenosis.   LV end diastolic pressure is moderately elevated.   Recommend uninterrupted dual antiplatelet therapy with Aspirin 81mg  daily and Ticagrelor 90mg  twice daily for a minimum of 12 months (ACS-Class I recommendation).   1.  Acute inferior MI secondary to mid RCA occlusion, treated successfully with primary PCI using a 3.0 x 32 mm Synergy DES.  Baseline 100% stenosis, TIMI 0 flow.  Post-PCI 0% residual stenosis and TIMI-3 flow. 2.  Mild nonobstructive plaquing of the left main, LAD, and left circumflex, without any high-grade stenoses in the left coronary distribution 3.  Moderately elevated LVEDP 24 mmHg   Recommendations:  2D echocardiogram for assessment of LV function Aggrastat discontinued at completion of procedure DAPT with aspirin and ticagrelor for 12 months without interruption Aggressive risk modification with high intensity statin drug, goal LDL less than 55 Consider fast-track discharge tomorrow if criteria met  Diagnostic Dominance: Right  Intervention         _____________  Echocardiogram 11/14/2022: Impressions: 1. Left ventricular ejection fraction, by estimation, is 60 to 65%. The  left ventricle has normal function. The left ventricle has no regional  wall motion abnormalities. There is mild concentric left ventricular  hypertrophy. Left ventricular diastolic  parameters are consistent with Grade I diastolic dysfunction (impaired  relaxation). Elevated left atrial pressure.   2. Right ventricular systolic function is normal. The right ventricular  size is normal. Tricuspid regurgitation signal is inadequate for assessing  PA pressure.   3. The mitral valve is normal in structure. No evidence of mitral valve  regurgitation. No evidence of mitral stenosis.   4. The aortic valve is tricuspid. Aortic valve regurgitation is not  visualized. No aortic stenosis is present.   5. The inferior vena cava is normal in size with greater than 50%  respiratory variability, suggesting right atrial pressure of 3 mmHg.   Comparison(s): No prior Echocardiogram.    History of Present Illness     Cynthia Patton is a 62 y.o. female with hypertension, hyperlipidemia,  diet-controlled diabetes, thyroid cancer s/p thyroidectomy now on Synthroid, and tobacco abuse who was admitted on 11/13/2022 for acute STEMI after presenting with sudden onset of crushing chest pain while watching TV. The pain acutely worsened until she called EMS around 5pm. She denied any radiation of chest pain, orthopnea, PND, lower extremity edema, or palpitations. She was given Aspirin 324mg  and 3 doses of sublingual Nitroglycerin in route to the ED.   Upon arrival to the ED, she was markedly hypertensive with BP  around 200/80. EKG showed inferior ST elevations. She was still having some chest pain although it had improved to a 2/10 on the pain scale. Code STEMI was called and she was taken to the cath lab for emergent cardiac catheterization.  Hospital Course     Consultants: None    Inferior STEMI CAD Patient presented with sudden onset of crushing chest pain and EKG showed inferior ST elevations concerning for STEMI. High-sensitivity troponin peaked at 21,466. Lactic acid was 2.2. Emergent cardiac catheterization showed 100% stenosis of RCA and otherwise only mild non-obstructive disease of left main, LAD, and LCX. LVEDP was elevated at 24 mmHg. She underwent successful PCI with DES to RCA lesion. Echo showed LVEF of 60-65% with mild LVH and grade 1 diastolic dysfunction. She tolerated the procedure well with no recurrent chest pain and was felt to be appropriate for a fast-track discharge. She was started on DAPT with Aspirin 81mg  daily and Brilinta 90mg  twice daily as well as Coreg 3.125mg  twice daily and Lipitor 80mg  daily.   Hypertension BP markedly elevated on arrival around 200/80. She has Diltiazem, Losartan, and HCTZ listed on her home medication list but states she is no longer taking the Diltiazem. BP improved after resvascularization but was still mildly elevated. Will start Coreg 3.125mg  twice daily (rather than Diltiazem) at discharge and home Losartan 50mg  daily and HCTZ 25mg  daily can be continued. Will repeat BMET at follow-up visit.  Hyperlipidemia Lipid panel this admission: Total Cholesterol 175, Triglycerides 141, HDL 41, LDL 106. LDL goal <70. She was started on Lipitor 80mg  daily. Will need repeat lipid panel and LFTs in 6-8 weeks.   Type 2 Diabetes Mellitus Hemoglobin A1c 6.8 this admission. She was previously on Semaglutide but states this was stopped because her hemoglobin A1c improved. Follow-up with PCP.  Hypothyroidism Patient has a history of thyroid cancer with prior thyroidectomy and is now on Synthroid. TSH normal. Continue home Synthroid dose.   Tobacco Abuse Patient reports smoking 1/2 pack per day. She was counseled on the importance of complete cessation. Will provide Nicotine patches at discharge.  Patient was seen and examined by Dr.  Ladona Ridgel and felt to be stable for a fast-track discharge (can be discharged at 3pm on 11/14/2022). Outpatient follow-up arranged. Medications as below.  Work note provided.   Did the patient have an acute coronary syndrome (MI, NSTEMI, STEMI, etc) this admission?:  Yes                               AHA/ACC ACS Clinical Performance & Quality Measures: Aspirin prescribed? - Yes ADP Receptor Inhibitor (Plavix/Clopidogrel, Brilinta/Ticagrelor or Effient/Prasugrel) prescribed (includes medically managed patients)? - Yes Beta Blocker prescribed? - Yes High Intensity Statin (Lipitor 40-80mg  or Crestor 20-40mg ) prescribed? - Yes EF assessed during THIS hospitalization? - Yes For EF <40%, was ACEI/ARB prescribed? - Not Applicable (EF >/= 40%) For EF <40%, Aldosterone Antagonist (Spironolactone or Eplerenone) prescribed? - Not Applicable (EF >/= 40%) Cardiac Rehab Phase II ordered (including medically managed patients)? - Yes       The patient will be scheduled for a TOC follow up appointment within 14 days.  A message has been sent to the [facility] and Scheduling Pool at the office where the patient should be seen for follow up.  _____________  Discharge Vitals Blood pressure (!) 154/72, pulse 66, temperature 98.6 F (37 C), temperature source Oral, resp. rate 20, height 5\' 3"  (1.6 m),  weight 97.5 kg, SpO2 96%.  Filed Weights   11/13/22 1920 11/13/22 1955 11/13/22 2130  Weight: 101.6 kg 96.6 kg 97.5 kg    Labs & Radiologic Studies    CBC Recent Labs    11/13/22 1921 11/14/22 0544  WBC 8.1 9.8  HGB 12.4 12.1  HCT 38.7 36.9  MCV 93.0 92.7  PLT 301 309   Basic Metabolic Panel Recent Labs    40/98/11 1921 11/14/22 0544  NA 138 138  K 3.6 3.8  CL 106 105  CO2 23 20*  GLUCOSE 171* 191*  BUN 16 13  CREATININE 1.02* 0.89  CALCIUM 8.6* 8.3*   Liver Function Tests Recent Labs    11/13/22 1921  AST 19  ALT 11  ALKPHOS 67  BILITOT 0.7  PROT 6.5  ALBUMIN 3.5   No results for  input(s): "LIPASE", "AMYLASE" in the last 72 hours. High Sensitivity Troponin:   Recent Labs  Lab 11/13/22 1921 11/13/22 2329  TROPONINIHS 149* 21,466*    BNP Invalid input(s): "POCBNP" D-Dimer No results for input(s): "DDIMER" in the last 72 hours. Hemoglobin A1C Recent Labs    11/13/22 1921  HGBA1C 6.8*   Fasting Lipid Panel Recent Labs    11/13/22 1934  CHOL 175  HDL 41  LDLCALC 106*  TRIG 141  CHOLHDL 4.3   Thyroid Function Tests Recent Labs    11/14/22 0544  TSH 1.305   _____________  ECHOCARDIOGRAM COMPLETE  Result Date: 11/14/2022    ECHOCARDIOGRAM REPORT   Patient Name:   Cynthia Patton Date of Exam: 11/14/2022 Medical Rec #:  [id number]      Height:       63.0 in Accession #:    [id number]     Weight:       214.9 lb Date of Birth:  1960-10-28       BSA:          1.994 m Patient Age:    62 years       BP:           157/68 mmHg Patient Gender: F              HR:           66 bpm. Exam Location:  Inpatient Procedure: 2D Echo, Cardiac Doppler and Color Doppler Indications:    I25.110 Atherosclerotic heart disease of native coronary artery                 with unstable angina pectoris  History:        Patient has no prior history of Echocardiogram examinations. CAD                 and Acute MI, Signs/Symptoms:Altered Mental Status; Risk                 Factors:Hypertension, Diabetes and Dyslipidemia.  Sonographer:    Sheralyn Boatman RDCS Referring Phys: [phone] MICHAEL COOPER  Sonographer Comments: Technically difficult study due to poor echo windows and patient is obese. Image acquisition challenging due to patient body habitus. Patient did not tolerate pressure of probe well. IMPRESSIONS  1. Left ventricular ejection fraction, by estimation, is 60 to 65%. The left ventricle has normal function. The left ventricle has no regional wall motion abnormalities. There is mild concentric left ventricular hypertrophy. Left ventricular diastolic parameters are consistent with Grade I diastolic  dysfunction (impaired relaxation). Elevated left atrial pressure.  2. Right ventricular systolic function is normal. The right ventricular  size is normal. Tricuspid regurgitation signal is inadequate for assessing PA pressure.  3. The mitral valve is normal in structure. No evidence of mitral valve regurgitation. No evidence of mitral stenosis.  4. The aortic valve is tricuspid. Aortic valve regurgitation is not visualized. No aortic stenosis is present.  5. The inferior vena cava is normal in size with greater than 50% respiratory variability, suggesting right atrial pressure of 3 mmHg. Comparison(s): No prior Echocardiogram. FINDINGS  Left Ventricle: Left ventricular ejection fraction, by estimation, is 60 to 65%. The left ventricle has normal function. The left ventricle has no regional wall motion abnormalities. The left ventricular internal cavity size was normal in size. There is  mild concentric left ventricular hypertrophy. Left ventricular diastolic parameters are consistent with Grade I diastolic dysfunction (impaired relaxation). Elevated left atrial pressure. Right Ventricle: The right ventricular size is normal. Right ventricular systolic function is normal. Tricuspid regurgitation signal is inadequate for assessing PA pressure. The tricuspid regurgitant velocity is 2.44 m/s, and with an assumed right atrial  pressure of 3 mmHg, the estimated right ventricular systolic pressure is 26.8 mmHg. Left Atrium: Left atrial size was normal in size. Right Atrium: Right atrial size was normal in size. Pericardium: There is no evidence of pericardial effusion. Mitral Valve: The mitral valve is normal in structure. No evidence of mitral valve regurgitation. No evidence of mitral valve stenosis. Tricuspid Valve: The tricuspid valve is normal in structure. Tricuspid valve regurgitation is trivial. No evidence of tricuspid stenosis. Aortic Valve: The aortic valve is tricuspid. Aortic valve regurgitation is not  visualized. No aortic stenosis is present. Pulmonic Valve: The pulmonic valve was normal in structure. Pulmonic valve regurgitation is not visualized. No evidence of pulmonic stenosis. Aorta: The aortic root is normal in size and structure. Venous: The inferior vena cava is normal in size with greater than 50% respiratory variability, suggesting right atrial pressure of 3 mmHg. IAS/Shunts: No atrial level shunt detected by color flow Doppler.  LEFT VENTRICLE PLAX 2D LVIDd:         5.00 cm     Diastology LVIDs:         3.45 cm     LV e' medial:    3.59 cm/s LV PW:         1.30 cm     LV E/e' medial:  22.7 LV IVS:        1.25 cm     LV e' lateral:   4.68 cm/s LVOT diam:     2.20 cm     LV E/e' lateral: 17.4 LV SV:         73 LV SV Index:   37 LVOT Area:     3.80 cm  LV Volumes (MOD) LV vol d, MOD A2C: 72.4 ml LV vol d, MOD A4C: 77.3 ml LV vol s, MOD A2C: 28.2 ml LV vol s, MOD A4C: 38.8 ml LV SV MOD A2C:     44.2 ml LV SV MOD A4C:     77.3 ml LV SV MOD BP:      42.7 ml RIGHT VENTRICLE             IVC RV S prime:     11.70 cm/s  IVC diam: 2.20 cm TAPSE (M-mode): 2.3 cm LEFT ATRIUM             Index        RIGHT ATRIUM           Index LA diam:  3.30 cm 1.66 cm/m   RA Area:     11.60 cm LA Vol (A2C):   49.3 ml 24.73 ml/m  RA Volume:   24.10 ml  12.09 ml/m LA Vol (A4C):   54.3 ml 27.23 ml/m LA Biplane Vol: 52.7 ml 26.43 ml/m  AORTIC VALVE LVOT Vmax:   89.10 cm/s LVOT Vmean:  59.200 cm/s LVOT VTI:    0.193 m  AORTA Ao Root diam: 2.70 cm Ao Asc diam:  3.10 cm MITRAL VALVE                TRICUSPID VALVE MV Area (PHT): 3.72 cm     TR Peak grad:   23.8 mmHg MV Decel Time: 204 msec     TR Vmax:        244.00 cm/s MV E velocity: 81.55 cm/s MV A velocity: 107.50 cm/s  SHUNTS MV E/A ratio:  0.76         Systemic VTI:  0.19 m                             Systemic Diam: 2.20 cm Olga Millers MD Electronically signed by Olga Millers MD Signature Date/Time: 11/14/2022/11:54:12 AM    Final    CARDIAC  CATHETERIZATION  Result Date: 11/13/2022   Mid RCA lesion is 100% stenosed.   Prox LAD to Mid LAD lesion is 30% stenosed.   Mid LM to Dist LM lesion is 25% stenosed.   Mid Cx lesion is 40% stenosed.   A drug-eluting stent was successfully placed using a STENT SYNERGY XD 3.0X32.   Post intervention, there is a 0% residual stenosis.   LV end diastolic pressure is moderately elevated.   Recommend uninterrupted dual antiplatelet therapy with Aspirin 81mg  daily and Ticagrelor 90mg  twice daily for a minimum of 12 months (ACS-Class I recommendation). 1.  Acute inferior MI secondary to mid RCA occlusion, treated successfully with primary PCI using a 3.0 x 32 mm Synergy DES.  Baseline 100% stenosis, TIMI 0 flow.  Post-PCI 0% residual stenosis and TIMI-3 flow. 2.  Mild nonobstructive plaquing of the left main, LAD, and left circumflex, without any high-grade stenoses in the left coronary distribution 3.  Moderately elevated LVEDP 24 mmHg Recommendations: 2D echocardiogram for assessment of LV function Aggrastat discontinued at completion of procedure DAPT with aspirin and ticagrelor for 12 months without interruption Aggressive risk modification with high intensity statin drug, goal LDL less than 55 Consider fast-track discharge tomorrow if criteria met   DG Chest Portable 1 View  Result Date: 11/13/2022 CLINICAL DATA:  Chest pain EXAM: PORTABLE CHEST 1 VIEW COMPARISON:  Chest x-ray 11/13/2022 FINDINGS: The heart and mediastinal contours are unchanged. Aortic calcification. No focal consolidation. Chronic coarsened interstitial markings with no overt pulmonary edema. No pleural effusion. No pneumothorax. No acute osseous abnormality. IMPRESSION: 1. Bronchitic changes. 2.  Aortic Atherosclerosis (ICD10-I70.0). Electronically Signed   By: Tish Frederickson M.D.   On: 11/13/2022 20:13   Disposition   Patient is being discharged home today in good condition.  Follow-up Plans & Appointments     Follow-up Information      Corrin Parker, PA-C Follow up.   Specialty: Cardiology Why: Hospital follow-up with Cardiology is scheduled for 11/23/2022 at 2:45pm. Please arrive 15 minutes early for check-in. If this date/time does not work for you, please call our office to reschedule. Contact information: [address] [place] [address] [place] [address] [phone]  Discharge Instructions     AMB Referral to Cardiac Rehabilitation - Phase II   Complete by: As directed    Diagnosis: STEMI   After initial evaluation and assessments completed: Virtual Based Care may be provided alone or in conjunction with Phase 2 Cardiac Rehab based on patient barriers.: Yes   Intensive Cardiac Rehabilitation (ICR) [facility] location only OR Traditional Cardiac Rehabilitation (TCR) *If criteria for ICR are not met will enroll in TCR [facility] only): Yes   Diet - low sodium heart healthy   Complete by: As directed    Increase activity slowly   Complete by: As directed         Discharge Medications   Allergies as of 11/14/2022       Reactions   Floxin [ocuflox] Other (See Comments)   Disoriented   Ofloxacin Other (See Comments)   Disoriented   Oxycodone-acetaminophen    Other Reaction(s): Other (See Comments) Hallucinations   Percocet [oxycodone-acetaminophen]         Medication List     STOP taking these medications    citalopram 20 MG tablet Commonly known as: CELEXA   diltiazem 180 MG 24 hr capsule Commonly known as: Dilt-XR   gabapentin 100 MG capsule Commonly known as: NEURONTIN   ibuprofen 800 MG tablet Commonly known as: ADVIL   Semaglutide (1 MG/DOSE) 2 MG/1.5ML Sopn       TAKE these medications    aspirin 81 MG chewable tablet Chew 1 tablet (81 mg total) by mouth daily. Start taking on: November 15, 2022   atorvastatin 80 MG tablet Commonly known as: LIPITOR Take 1 tablet (80 mg total) by mouth daily. Start taking on: November 15, 2022   blood glucose meter kit  and supplies Kit Dispense based on patient and insurance preference. Use up to four times daily as directed. (FOR ICD-9 250.00, 250.01).   carvedilol 3.125 MG tablet Commonly known as: Coreg Take 1 tablet (3.125 mg total) by mouth 2 (two) times daily.   fluticasone 50 MCG/ACT nasal spray Commonly known as: FLONASE Place 2 sprays into both nostrils daily.   hydrochlorothiazide 25 MG tablet Commonly known as: HYDRODIURIL Take 25 mg by mouth daily.   levothyroxine 175 MCG tablet Commonly known as: SYNTHROID Take 175 mcg by mouth daily before breakfast.   losartan 50 MG tablet Commonly known as: COZAAR Take 50 mg by mouth daily.   mometasone 50 MCG/ACT nasal spray Commonly known as: Nasonex Place 2 sprays into the nose daily.   nicotine 14 mg/24hr patch Commonly known as: NICODERM CQ - dosed in mg/24 hours Place 1 patch (14 mg total) onto the skin daily.   nitroGLYCERIN 0.4 MG SL tablet Commonly known as: NITROSTAT Place 1 tablet (0.4 mg total) under the tongue every 5 (five) minutes as needed for chest pain.   OneTouch Verio test strip Generic drug: glucose blood USE UP TO 4 TIMES DAILY AS DIRECTED   ticagrelor 90 MG Tabs tablet Commonly known as: BRILINTA Take 1 tablet (90 mg total) by mouth 2 (two) times daily.           Outstanding Labs/Studies   Repeat BMET at follow-up visit. Repeat lipid panel and LFTs in 6-8 weeks.  Duration of Discharge Encounter   Greater than 30 minutes including physician time.  Signed, Corrin Parker, PA-C 11/14/2022, 1:17 PM  Cardiology Attending  Patient seen and examined. See my rounding note for more details. She is stable after STEMI/PCI and will be discharged on medical  therapy as above. She is encouraged to stop smoking.   Sharlot Gowda Kinsey Karch,MD

## 2022-11-14 NOTE — Progress Notes (Signed)
Progress Note  Patient Name: Cynthia Patton Date of Encounter: 11/14/2022  Primary Cardiologist: None   Subjective   Feels well s/p PCI for inferior STEMI.  Inpatient Medications    Scheduled Meds:  aspirin  81 mg Oral Daily   atorvastatin  80 mg Oral Daily   Chlorhexidine Gluconate Cloth  6 each Topical Daily   fluticasone  2 spray Each Nare Daily   gabapentin  100 mg Oral BID   losartan  50 mg Oral Daily   sodium chloride flush  3 mL Intravenous Q12H   ticagrelor  90 mg Oral BID   Continuous Infusions:  sodium chloride     PRN Meds: sodium chloride, nitroGLYCERIN, ondansetron (ZOFRAN) IV, mouth rinse, sodium chloride flush   Vital Signs    Vitals:   11/14/22 0600 11/14/22 0700 11/14/22 0758 11/14/22 0800  BP: (!) 146/70 (!) 157/68    Pulse: 67 78 66   Resp: (!) 22 12 (!) 23   Temp:    98.6 F (37 C)  TempSrc:    Oral  SpO2: 98% 97% 94%   Weight:      Height:        Intake/Output Summary (Last 24 hours) at 11/14/2022 0930 Last data filed at 11/14/2022 0500 Gross per 24 hour  Intake 645.17 ml  Output 950 ml  Net -304.83 ml   Filed Weights   11/13/22 1920 11/13/22 1955 11/13/22 2130  Weight: 101.6 kg 96.6 kg 97.5 kg    Telemetry    nsr - Personally Reviewed  ECG    Nsr with no residual ST elevation - Personally Reviewed  Physical Exam   GEN: No acute distress.   Neck: No JVD Cardiac: RRR, no murmurs, rubs, or gallops.  Respiratory: Clear to auscultation bilaterally. GI: Soft, nontender, non-distended  MS: No edema; No deformity. Neuro:  Nonfocal  Psych: Normal affect   Labs    Chemistry Recent Labs  Lab 11/13/22 1921 11/14/22 0544  NA 138 138  K 3.6 3.8  CL 106 105  CO2 23 20*  GLUCOSE 171* 191*  BUN 16 13  CREATININE 1.02* 0.89  CALCIUM 8.6* 8.3*  PROT 6.5  --   ALBUMIN 3.5  --   AST 19  --   ALT 11  --   ALKPHOS 67  --   BILITOT 0.7  --   GFRNONAA >60 >60  ANIONGAP 9 13     Hematology Recent Labs  Lab  11/13/22 1921 11/14/22 0544  WBC 8.1 9.8  RBC 4.16 3.98  HGB 12.4 12.1  HCT 38.7 36.9  MCV 93.0 92.7  MCH 29.8 30.4  MCHC 32.0 32.8  RDW 12.8 13.0  PLT 301 309    Cardiac EnzymesNo results for input(s): "TROPONINI" in the last 168 hours. No results for input(s): "TROPIPOC" in the last 168 hours.   BNPNo results for input(s): "BNP", "PROBNP" in the last 168 hours.   DDimer No results for input(s): "DDIMER" in the last 168 hours.   Radiology    CARDIAC CATHETERIZATION  Result Date: 11/13/2022   Mid RCA lesion is 100% stenosed.   Prox LAD to Mid LAD lesion is 30% stenosed.   Mid LM to Dist LM lesion is 25% stenosed.   Mid Cx lesion is 40% stenosed.   A drug-eluting stent was successfully placed using a STENT SYNERGY XD 3.0X32.   Post intervention, there is a 0% residual stenosis.   LV end diastolic pressure is moderately elevated.  Recommend uninterrupted dual antiplatelet therapy with Aspirin 81mg  daily and Ticagrelor 90mg  twice daily for a minimum of 12 months (ACS-Class I recommendation). 1.  Acute inferior MI secondary to mid RCA occlusion, treated successfully with primary PCI using a 3.0 x 32 mm Synergy DES.  Baseline 100% stenosis, TIMI 0 flow.  Post-PCI 0% residual stenosis and TIMI-3 flow. 2.  Mild nonobstructive plaquing of the left main, LAD, and left circumflex, without any high-grade stenoses in the left coronary distribution 3.  Moderately elevated LVEDP 24 mmHg Recommendations: 2D echocardiogram for assessment of LV function Aggrastat discontinued at completion of procedure DAPT with aspirin and ticagrelor for 12 months without interruption Aggressive risk modification with high intensity statin drug, goal LDL less than 55 Consider fast-track discharge tomorrow if criteria met   DG Chest Portable 1 View  Result Date: 11/13/2022 CLINICAL DATA:  Chest pain EXAM: PORTABLE CHEST 1 VIEW COMPARISON:  Chest x-ray 11/13/2022 FINDINGS: The heart and mediastinal contours are unchanged.  Aortic calcification. No focal consolidation. Chronic coarsened interstitial markings with no overt pulmonary edema. No pleural effusion. No pneumothorax. No acute osseous abnormality. IMPRESSION: 1. Bronchitic changes. 2.  Aortic Atherosclerosis (ICD10-I70.0). Electronically Signed   By: Tish Frederickson M.D.   On: 11/13/2022 20:13    Cardiac Studies   See heart cath. Echo pending  Patient Profile     62 y.o. female admitted with inferior STEMI  Assessment & Plan    Inferior STEMI - she is doing well s/p PCI. No residual angina and ECG shows complete resolution of ST elevation. Note from Dr. Erlinda Hong that early DC an option and she has remained stable. Plan for DC home later today on DAPT and GDMT. Await 2D echo. Tobacco abuse - I strongly encouraged the patient to stop smoking. Dyslipidemia - She will be treated with high dose statin therapy.  For questions or updates, please contact [facility] Please consult [email or web address] for contact info under Cardiology/STEMI.      Signed, Lewayne Bunting, MD  11/14/2022, 9:30 AM

## 2022-11-14 NOTE — Progress Notes (Signed)
  Echocardiogram 2D Echocardiogram has been performed.  Cynthia Patton 11/14/2022, 11:34 AM

## 2022-11-15 ENCOUNTER — Encounter (HOSPITAL_COMMUNITY): Payer: Self-pay | Admitting: Cardiovascular Disease

## 2022-11-15 LAB — LIPOPROTEIN A (LPA): Lipoprotein (a): 179.6 nmol/L — ABNORMAL HIGH (ref <75.0)

## 2022-11-15 NOTE — Telephone Encounter (Signed)
CARDIAC REHAB PHASE I      Unable to reach via phone. Will mail MI/stent education materials to address on file. Referral for CRP2 sent to [facility].      Woodroe Chen, RN BSN 11/15/2022 1:26 PM

## 2022-11-15 NOTE — Telephone Encounter (Signed)
Left voicemail to return call to office.

## 2022-11-15 NOTE — Progress Notes (Unsigned)
Cardiology Office Note:    Date:  11/15/2022   ID:  Cynthia Patton, DOB 1960/08/15, MRN [id number]  PCP:  Ivonne Andrew, NP  Cardiologist:  None { Click to update primary MD,subspecialty MD or APP then REFRESH:1}    Referring MD: Ivonne Andrew, NP   Chief Complaint: hospital follow-up of STEMI  History of Present Illness:    Cynthia Patton is a 62 y.o. female with a history of CAD with recent inferior STEMI on 11/2/204 s/p DES to RCA, hypertension, hyperlipidemia,  diet-controlled diabetes, thyroid cancer s/p thyroidectomy now on Synthroid, and tobacco abuse who is followed by Dr. Excell Seltzer and presents today for hospital follow-up after STEMI.  Patient was first seen by Cardiology during recent hospitalization. She was admitted from 11/13/2022 to 11/15/2022 for an inferior STEMI after presenting with sudden onset of chest pain. High-sensitivity troponin peaked at 21,466. Lactic acid was 2.2. Emergent cardiac catheterization showed 100% stenosis of RCA and otherwise only mild non-obstructive disease of left main, LAD, and LCX. LVEDP was elevated at 24 mmHg. She underwent successful PCI with DES to RCA lesion. Echo showed LVEF of 60-65% with mild LVH and grade 1 diastolic dysfunction. She was started on DAPT with Aspirin and Brilinta as well as a beta-blocker and statin.   Patient presents today for follow-up. ***     EKGs/Labs/Other Studies Reviewed:    The following studies were reviewed:  Left Cardiac Catheterization 11/13/2022:   Mid RCA lesion is 100% stenosed.   Prox LAD to Mid LAD lesion is 30% stenosed.   Mid LM to Dist LM lesion is 25% stenosed.   Mid Cx lesion is 40% stenosed.   A drug-eluting stent was successfully placed using a STENT SYNERGY XD 3.0X32.   Post intervention, there is a 0% residual stenosis.   LV end diastolic pressure is moderately elevated.   Recommend uninterrupted dual antiplatelet therapy with Aspirin 81mg  daily and Ticagrelor 90mg  twice daily for a  minimum of 12 months (ACS-Class I recommendation).   1.  Acute inferior MI secondary to mid RCA occlusion, treated successfully with primary PCI using a 3.0 x 32 mm Synergy DES.  Baseline 100% stenosis, TIMI 0 flow.  Post-PCI 0% residual stenosis and TIMI-3 flow. 2.  Mild nonobstructive plaquing of the left main, LAD, and left circumflex, without any high-grade stenoses in the left coronary distribution 3.  Moderately elevated LVEDP 24 mmHg   Recommendations:  2D echocardiogram for assessment of LV function Aggrastat discontinued at completion of procedure DAPT with aspirin and ticagrelor for 12 months without interruption Aggressive risk modification with high intensity statin drug, goal LDL less than 55 Consider fast-track discharge tomorrow if criteria met   Diagnostic Dominance: Right  Intervention          _____________   Echocardiogram 11/14/2022: Impressions: 1. Left ventricular ejection fraction, by estimation, is 60 to 65%. The  left ventricle has normal function. The left ventricle has no regional  wall motion abnormalities. There is mild concentric left ventricular  hypertrophy. Left ventricular diastolic  parameters are consistent with Grade I diastolic dysfunction (impaired  relaxation). Elevated left atrial pressure.   2. Right ventricular systolic function is normal. The right ventricular  size is normal. Tricuspid regurgitation signal is inadequate for assessing  PA pressure.   3. The mitral valve is normal in structure. No evidence of mitral valve  regurgitation. No evidence of mitral stenosis.   4. The aortic valve is tricuspid. Aortic valve regurgitation is  not  visualized. No aortic stenosis is present.   5. The inferior vena cava is normal in size with greater than 50%  respiratory variability, suggesting right atrial pressure of 3 mmHg.   Comparison(s): No prior Echocardiogram.   EKG:  EKG ordered today. EKG personally reviewed and demonstrates  ***.  Recent Labs: 11/13/2022: ALT 11 11/14/2022: BUN 13; Creatinine, Ser 0.89; Hemoglobin 12.1; Platelets 309; Potassium 3.8; Sodium 138; TSH 1.305  Recent Lipid Panel    Component Value Date/Time   CHOL 175 11/13/2022 1934   CHOL 179 02/09/2021 1139   TRIG 141 11/13/2022 1934   HDL 41 11/13/2022 1934   HDL 47 02/09/2021 1139   CHOLHDL 4.3 11/13/2022 1934   VLDL 28 11/13/2022 1934   LDLCALC 106 (H) 11/13/2022 1934   LDLCALC 110 (H) 02/09/2021 1139    Physical Exam:    Vital Signs: There were no vitals taken for this visit.    Wt Readings from Last 3 Encounters:  11/13/22 214 lb 15.2 oz (97.5 kg)  09/10/21 224 lb (101.6 kg)  06/30/21 222 lb 4.8 oz (100.8 kg)     General: 62 y.o. obese African-American female in no acute distress. HEENT: Normocephalic and atraumatic. Sclera clear.  Neck: Supple. No carotid bruits. No JVD. Heart: RRR. Distinct S1 and S2. No murmurs, gallops, or rubs. Right radial cath site soft with no signs of hematoma. Lungs: No increased work of breathing. Clear to ausculation bilaterally. No wheezes, rhonchi, or rales.  Extremities: No lower extremity edema.   Skin: Warm and dry. Neuro: No focal deficits. Psych: Normal affect. Responds appropriately.   Assessment:    No diagnosis found.  Plan:    CAD with Recent STEMI Patient was recently admitted for inferior STEMI on 11/13/2022 s/p DES to RCA.  - No recurrent chest pain.  - Continue DAPT with Aspirin and Brilinta.  - Continue beta-blocker and statin.   Hypertension  BP mildly elevated at 144/74.  - Current medications: Coreg 3.125mg  twice daily, Losartan 50mg  daily, and HCTZ 25mg  daily. She does not like the HCTZ (her PCP recently switched her from Diltiazem to this because the Pharmacy was apparently out of Diltiazem.  - Will stop HCTZ. - Will increase Coreg to 6.25mg  twice daily and increase Losartan to 100mg  daily.  - Will repeat BMET today.  Hyperlipidemia Lipid panel on 11/13/2022  during recent admission: Total Cholesterol 175, Triglycerides 141, HDL 41, LDL 106. LDL goal <70.  - She was started on Lipitor 80mg  daily. Continue.  - Will need repeat lipid panel and LFTs in 6-8 weeks.   Type 2 Diabetes Mellitus Hemoglobin A1c 6.8 on 11/13/2022 during recent admission.  - She used to be on Semaglutide but states this was stopped because her hemoglobin A1c improved.  - Management per PCP.   Tobacco Abuse She continues to smoke 2 cigarrettes per day. She was prescribed a Nicotine patch at recent discharge but she states her Pharmacy never gave this to her.  - Discussed importance of complete cessation.  - Will resend Nicotine patches.   Disposition: Follow up in ***   Signed, Corrin Parker, PA-C  11/15/2022 7:02 PM    [facility]

## 2022-11-16 ENCOUNTER — Telehealth (HOSPITAL_COMMUNITY): Payer: Self-pay | Admitting: *Deleted

## 2022-11-16 LAB — POCT ACTIVATED CLOTTING TIME: Activated Clotting Time: 285 s

## 2022-11-17 ENCOUNTER — Telehealth (HOSPITAL_COMMUNITY): Payer: Self-pay

## 2022-11-17 NOTE — Telephone Encounter (Signed)
Called patient to see if she is interested in the Cardiac Rehab Program. Patient expressed interest. Explained scheduling process and went over insurance, patient verbalized understanding. Will contact patient for scheduling once f/u has been completed. 

## 2022-11-17 NOTE — Telephone Encounter (Signed)
Pt insurance is active and benefits verified through [employer] Co-pay 0, DED $5,500/$0 met, out of pocket $9,450/$96.19 met, co-insurance 50%. no pre-authorization required, Mej/[employer] 11/17/2022@245 , REF# [id number]   How many CR sessions are covered? (for TCR ONLY,) no limit Is this a lifetime maximum or an annual maximum? annual Has the member used any of these services to date? no Is there a time limit (weeks/months) on start of program and/or program completion? no     Will contact patient to see if she is interested in the Cardiac Rehab Program. If interested, patient will need to complete follow up appt. Once completed, patient will be contacted for scheduling upon review by the RN Navigator.

## 2022-11-23 ENCOUNTER — Encounter: Payer: Self-pay | Admitting: Student

## 2022-11-23 ENCOUNTER — Ambulatory Visit: Payer: Managed Care, Other (non HMO) | Attending: Student | Admitting: Student

## 2022-11-23 VITALS — BP 144/74 | HR 81 | Ht 63.0 in | Wt 217.0 lb

## 2022-11-23 DIAGNOSIS — E785 Hyperlipidemia, unspecified: Secondary | ICD-10-CM

## 2022-11-23 DIAGNOSIS — R0683 Snoring: Secondary | ICD-10-CM

## 2022-11-23 DIAGNOSIS — Z720 Tobacco use: Secondary | ICD-10-CM

## 2022-11-23 DIAGNOSIS — E118 Type 2 diabetes mellitus with unspecified complications: Secondary | ICD-10-CM

## 2022-11-23 DIAGNOSIS — I2510 Atherosclerotic heart disease of native coronary artery without angina pectoris: Secondary | ICD-10-CM | POA: Diagnosis not present

## 2022-11-23 DIAGNOSIS — I10 Essential (primary) hypertension: Secondary | ICD-10-CM

## 2022-11-23 DIAGNOSIS — I252 Old myocardial infarction: Secondary | ICD-10-CM | POA: Diagnosis not present

## 2022-11-23 MED ORDER — LOSARTAN POTASSIUM 100 MG PO TABS: 100.0000 mg | ORAL_TABLET | Freq: Every day | ORAL | 3 refills | Status: DC

## 2022-11-23 MED ORDER — CARVEDILOL 6.25 MG PO TABS: 6.2500 mg | ORAL_TABLET | Freq: Two times a day (BID) | ORAL | 3 refills | Status: DC

## 2022-11-23 MED ORDER — NICOTINE 14 MG/24HR TD PT24: 14.0000 mg | MEDICATED_PATCH | TRANSDERMAL | 0 refills | Status: AC

## 2022-11-23 NOTE — Patient Instructions (Signed)
Medication Instructions:  Stop hydrochlorothiazide. Increase Coreg to 6.25 mg ( Take 1 Tablet Twice Daily). Increase Losartan to 100 mg ( Take 1 Tablet Daily). *If you need a refill on your cardiac medications before your next appointment, please call your pharmacy*   Lab Work: BMET Today BMET 1 week Lipid Panel, Hepatic Panel 6-8 weeks If you have labs (blood work) drawn today and your tests are completely normal, you will receive your results only by: MyChart Message (if you have MyChart) OR A paper copy in the mail If you have any lab test that is abnormal or we need to change your treatment, we will call you to review the results.   Testing/Procedures: WatchPAT?  Is a FDA cleared portable home sleep study test that uses a watch and 3 points of contact to monitor 7 different channels, including your heart rate, oxygen saturations, body position, snoring, and chest motion.  The study is easy to use from the comfort of your own home and accurately detect sleep apnea.  Before bed, you attach the chest sensor, attached the sleep apnea bracelet to your nondominant hand, and attach the finger probe.  After the study, the raw data is downloaded from the watch and scored for apnea events.   For more information: [email or web address]  Patient Testing Instructions:  Do not put battery into the device until bedtime when you are ready to begin the test. Please call the support number if you need assistance after following the instructions below: 24 hour support line- [phone] or [facility] support at [phone] (option 2)  Download the IntelWatchPAT One" app through the google play store or App Store  Be sure to turn on or enable access to bluetooth in settlings on your smartphone/ device  Make sure no other bluetooth devices are on and within the vicinity of your smartphone/ device and WatchPAT watch during testing.  Make sure to leave your smart phone/ device plugged in  and charging all night.  When ready for bed:  Follow the instructions step by step in the WatchPAT One App to activate the testing device. For additional instructions, including video instruction, visit the WatchPAT One video on Youtube. You can search for WatchPat One within Youtube (video is 4 minutes and 18 seconds) or enter: https://youtube/watch?v=BCce_vbiwxE Please note: You will be prompted to enter a Pin to connect via bluetooth when starting the test. The PIN will be assigned to you when you receive the test.  The device is disposable, but it recommended that you retain the device until you receive a call letting you know the study has been received and the results have been interpreted.  We will let you know if the study did not transmit to [place] properly after the test is completed. You do not need to call us to confirm the receipt of the test.  Please complete the test within 48 hours of receiving PIN.   Frequently Asked Questions:  What is Watch Dennie Bible one?  A single use fully disposable home sleep apnea testing device and will not need to be returned after completion.  What are the requirements to use WatchPAT one?  The be able to have a successful watchpat one sleep study, you should have your Watch pat one device, your smart phone, watch pat one app, your PIN number and Internet access What type of phone do I need?  You should have a smart phone that uses Android 5.1 and above or any Iphone with IOS 10 and above How  can I download the WatchPAT one app?  Based on your device type search for WatchPAT one app either in google play for android devices or APP store for Iphone's Where will I get my PIN for the study?  Your PIN will be provided by your physician's office. It is used for authentication and if you lose/forget your PIN, please reach out to your providers office.  I do not have Internet at home. Can I do WatchPAT one study?  WatchPAT One needs Internet connection throughout the  night to be able to transmit the sleep data. You can use your home/local internet or your cellular's data package. However, it is always recommended to use home/local Internet. It is estimated that between 20MB-30MB will be used with each study.However, the application will be looking for space in the phone to start the study.  What happens if I lose internet or bluetooth connection?  During the internet disconnection, your phone will not be able to transmit the sleep data. All the data, will be stored in your phone. As soon as the internet connection is back on, the phone will being sending the sleep data. During the bluetooth disconnection, WatchPAT one will not be able to to send the sleep data to your phone. Data will be kept in the [facility] one until two devices have bluetooth connection back on. As soon as the connection is back on, WatchPAT one will send the sleep data to the phone.  How long do I need to wear the WatchPAT one?  After you start the study, you should wear the device at least 6 hours.  How far should I keep my phone from the device?  During the night, your phone should be within 15 feet.  What happens if I leave the room for restroom or other reasons?  Leaving the room for any reason will not cause any problem. As soon as your get back to the room, both devices will reconnect and will continue to send the sleep data. Can I use my phone during the sleep study?  Yes, you can use your phone as usual during the study. But it is recommended to put your watchpat one on when you are ready to go to bed.  How will I get my study results?  A soon as you completed your study, your sleep data will be sent to the provider. They will then share the results with you when they are ready.      Follow-Up: At [facility], you and your health needs are our priority.  As part of our continuing mission to provide you with exceptional heart care, we have created designated Provider  Care Teams.  These Care Teams include your primary Cardiologist (physician) and Advanced Practice Providers (APPs -  Physician Assistants and Nurse Practitioners) who all work together to provide you with the care you need, when you need it.  We recommend signing up for the patient portal called "MyChart".  Sign up information is provided on this After Visit Summary.  MyChart is used to connect with patients for Virtual Visits (Telemedicine).  Patients are able to view lab/test results, encounter notes, upcoming appointments, etc.  Non-urgent messages can be sent to your provider as well.   To learn more about what you can do with MyChart, go to [email or web address].    Your next appointment:   3 month(s)  Provider:   Marjie Skiff, PA-C

## 2022-11-24 ENCOUNTER — Encounter: Payer: Self-pay | Admitting: Student

## 2022-11-24 LAB — BASIC METABOLIC PANEL
BUN/Creatinine Ratio: 14 (ref 12–28)
BUN: 17 mg/dL (ref 8–27)
CO2: 27 mmol/L (ref 20–29)
Calcium: 10 mg/dL (ref 8.7–10.3)
Chloride: 101 mmol/L (ref 96–106)
Creatinine, Ser: 1.19 mg/dL — ABNORMAL HIGH (ref 0.57–1.00)
Glucose: 170 mg/dL — ABNORMAL HIGH (ref 70–99)
Potassium: 4 mmol/L (ref 3.5–5.2)
Sodium: 142 mmol/L (ref 134–144)
eGFR: 51 mL/min/{1.73_m2} — ABNORMAL LOW (ref >59)

## 2022-11-25 ENCOUNTER — Telehealth: Payer: Self-pay | Admitting: Student

## 2022-11-25 DIAGNOSIS — I2510 Atherosclerotic heart disease of native coronary artery without angina pectoris: Secondary | ICD-10-CM

## 2022-11-25 NOTE — Telephone Encounter (Signed)
Patient is returning call in regards to results. Requesting call back. Please call after 3 P.M.

## 2022-11-25 NOTE — Telephone Encounter (Signed)
 Attempted to call patient, no answer left message requesting a call back.

## 2022-11-26 NOTE — Telephone Encounter (Signed)
Corrin Parker, PA-C 11/24/2022  9:21 AM EST     Please notify patient of results: Creatinine (which helps assess kidney function) is slightly worse form discharge. However, we stopped her HCTZ which may help. Please remind her that she should come in for another repeat BMET in 1 week since we increased her Losartan (this has already been ordered). Her blood sugar was mildly elevated. She should continue to monitor this at home and follow-up with PCP if it remains elevated.   Thank you!   Patient identification verified by 2 forms. Marilynn Rail, RN   Called and spoke to patient  Relayed result message to patient  Advised patient to present to lab in 1 week to recheck BMET  Patient states she received call from Cardio Rehab on Monday, does not have a call back number  RN attempted to locate and provide number  Patient disconnected call

## 2022-12-01 ENCOUNTER — Other Ambulatory Visit: Payer: Self-pay

## 2022-12-01 MED ORDER — CARVEDILOL 6.25 MG PO TABS: 6.2500 mg | ORAL_TABLET | Freq: Two times a day (BID) | ORAL | 3 refills | Status: DC

## 2022-12-01 MED ORDER — ASPIRIN 81 MG PO CHEW: 81.0000 mg | CHEWABLE_TABLET | Freq: Every day | ORAL | 3 refills | Status: AC

## 2022-12-02 ENCOUNTER — Telehealth (HOSPITAL_COMMUNITY): Payer: Self-pay

## 2022-12-02 ENCOUNTER — Encounter (HOSPITAL_COMMUNITY): Payer: Self-pay

## 2022-12-02 NOTE — Telephone Encounter (Signed)
Attempted to call patient in regards to Cardiac Rehab - LM on VM Mailed letter 

## 2022-12-15 ENCOUNTER — Other Ambulatory Visit: Payer: Self-pay

## 2022-12-15 DIAGNOSIS — I252 Old myocardial infarction: Secondary | ICD-10-CM

## 2022-12-15 DIAGNOSIS — I10 Essential (primary) hypertension: Secondary | ICD-10-CM

## 2022-12-15 DIAGNOSIS — I2510 Atherosclerotic heart disease of native coronary artery without angina pectoris: Secondary | ICD-10-CM

## 2022-12-15 DIAGNOSIS — E785 Hyperlipidemia, unspecified: Secondary | ICD-10-CM

## 2022-12-21 ENCOUNTER — Telehealth (HOSPITAL_COMMUNITY): Payer: Self-pay

## 2022-12-21 NOTE — Telephone Encounter (Signed)
No response from in regards to Cardiac Rehab  Closed referral

## 2022-12-23 ENCOUNTER — Telehealth (HOSPITAL_COMMUNITY): Payer: Self-pay

## 2022-12-23 ENCOUNTER — Encounter (HOSPITAL_COMMUNITY): Payer: Self-pay

## 2022-12-23 NOTE — Telephone Encounter (Signed)
  Called pt to confirm appt for 12/16 at 1030. Gave pt instructions for appt, what to wear, office address, eating/taking meds before, and if sick to call and reschedule. Pt voiced understanding, all questions answered.   Health history completed? Yes   Jonna Coup, MS, ACSM-CEP 12/23/2022 3:32 PM

## 2022-12-27 ENCOUNTER — Encounter (HOSPITAL_COMMUNITY)
Admission: RE | Admit: 2022-12-27 | Discharge: 2022-12-27 | Disposition: A | Discharge status: Home / Self Care | Payer: Medicaid Other | Source: Ambulatory Visit | Attending: Cardiovascular Disease | Admitting: Cardiovascular Disease

## 2022-12-27 VITALS — BP 146/84 | HR 70 | Ht 63.0 in | Wt 221.6 lb

## 2022-12-27 DIAGNOSIS — Z955 Presence of coronary angioplasty implant and graft: Secondary | ICD-10-CM | POA: Diagnosis not present

## 2022-12-27 DIAGNOSIS — Z5189 Encounter for other specified aftercare: Secondary | ICD-10-CM | POA: Insufficient documentation

## 2022-12-27 DIAGNOSIS — I252 Old myocardial infarction: Secondary | ICD-10-CM | POA: Diagnosis not present

## 2022-12-27 NOTE — Progress Notes (Signed)
Cardiac Individual Treatment Plan  Patient Details  Name: Cynthia Patton MRN: [id number] Date of Birth: [birth year] Referring Provider:   Flowsheet Row CARDIAC REHAB PHASE II ORIENTATION from 12/27/2022 in [facility]  Referring Provider Tonny Bollman, MD       Initial Encounter Date:  Flowsheet Row CARDIAC REHAB PHASE II ORIENTATION from 12/27/2022 in [facility]  Date 12/27/22       Visit Diagnosis: 11/13/22 DES RCA  Patient's Home Medications on Admission:  Current Outpatient Medications:    aspirin 81 MG chewable tablet, Chew 1 tablet (81 mg total) by mouth daily., Disp: 90 tablet, Rfl: 3   atorvastatin (LIPITOR) 80 MG tablet, Take 1 tablet (80 mg total) by mouth daily., Disp: 30 tablet, Rfl: 2   blood glucose meter kit and supplies KIT, Dispense based on patient and insurance preference. Use up to four times daily as directed. (FOR ICD-9 250.00, 250.01)., Disp: 1 each, Rfl: 0   carvedilol (COREG) 6.25 MG tablet, Take 1 tablet (6.25 mg total) by mouth 2 (two) times daily., Disp: 180 tablet, Rfl: 3   fluticasone (FLONASE) 50 MCG/ACT nasal spray, Place 2 sprays into both nostrils daily., Disp: 16 g, Rfl: 6   levothyroxine (SYNTHROID) 175 MCG tablet, Take 175 mcg by mouth daily before breakfast., Disp: , Rfl:    losartan (COZAAR) 100 MG tablet, Take 1 tablet (100 mg total) by mouth daily., Disp: 90 tablet, Rfl: 3   mometasone (NASONEX) 50 MCG/ACT nasal spray, Place 2 sprays into the nose daily., Disp: 17 g, Rfl: 1   nitroGLYCERIN (NITROSTAT) 0.4 MG SL tablet, Place 1 tablet (0.4 mg total) under the tongue every 5 (five) minutes as needed for chest pain., Disp: 25 tablet, Rfl: 2   ONETOUCH VERIO test strip, USE UP TO 4 TIMES DAILY AS DIRECTED, Disp: , Rfl:    ticagrelor (BRILINTA) 90 MG TABS tablet, Take 1 tablet (90 mg total) by mouth 2 (two) times daily., Disp: 60  tablet, Rfl: 11  Past Medical History: Past Medical History:  Diagnosis Date   Acute low back pain 03/25/2020   Anxiety    Cancer (HCC) 2000?   thyroid cancer   Depression    Diabetes mellitus without complication (HCC)    Hyperlipidemia    Hypertension    Thyroid disease     Tobacco Use: Social History   Tobacco Use  Smoking Status Every Day   Current packs/day: 0.50   Types: Cigarettes  Smokeless Tobacco Never    Labs: Review Flowsheet  More data exists      Latest Ref Rng & Units 07/27/2019 11/01/2019 02/08/2020 02/09/2021 11/13/2022  Labs for ITP Cardiac and Pulmonary Rehab  Cholestrol 0 - 200 mg/dL 782  - 956  213  086   LDL (calc) 0 - 99 mg/dL 578  - 469  629  528   HDL-C >40 mg/dL 37  - 44  47  41   Trlycerides <150 mg/dL 413  - 97  244  010   Hemoglobin A1c 4.8 - 5.6 % - 6.3  6.2  6.2  6.2  6.2  6.8  6.8  6.8  6.8  6.8     Details       Multiple values from one day are sorted in reverse-chronological order         Capillary Blood Glucose: Lab Results  Component Value Date  GLUCAP 126 (H) 11/13/2022   GLUCAP 248 (H) 07/08/2017   GLUCAP 112 (H) 07/09/2010     Exercise Target Goals: Exercise Program Goal: Individual exercise prescription set using results from initial 6 min walk test and THRR while considering  patient's activity barriers and safety.   Exercise Prescription Goal: Initial exercise prescription builds to 30-45 minutes a day of aerobic activity, 2-3 days per week.  Home exercise guidelines will be given to patient during program as part of exercise prescription that the participant will acknowledge.  Activity Barriers & Risk Stratification:  Activity Barriers & Cardiac Risk Stratification - 12/27/22 1111       Activity Barriers & Cardiac Risk Stratification   Activity Barriers Shortness of Breath;Joint Problems    Cardiac Risk Stratification High   <5 METs on            6 Minute Walk:  6 Minute Walk     Row Name  12/27/22 1256         6 Minute Walk   Phase Initial     Distance 1390 feet     Walk Time 6 minutes     # of Rest Breaks 2  very brief standing rest     MPH 2.64     METS 3     RPE 9     Perceived Dyspnea  0     VO2 Peak 10.51     Symptoms Yes (comment)     Comments 1/10 L calf pain- resolved with rest     Resting HR 70 bpm     Resting BP 146/84     Resting Oxygen Saturation  98 %     Exercise Oxygen Saturation  during 6 min walk 98 %     Max Ex. HR 93 bpm     Max Ex. BP 176/84     2 Minute Post BP 136/82              Oxygen Initial Assessment:   Oxygen Re-Evaluation:   Oxygen Discharge (Final Oxygen Re-Evaluation):   Initial Exercise Prescription:  Initial Exercise Prescription - 12/27/22 1200       Date of Initial Exercise RX and Referring Provider   Date 12/27/22    Referring Provider Tonny Bollman, MD    Expected Discharge Date 03/23/23      NuStep   Level 1    SPM 65    Minutes 15    METs 2      Recumbant Elliptical   Level 1    RPM 55    Watts 70    Minutes 15    METs 2.5      Prescription Details   Frequency (times per week) 3    Duration Progress to 30 minutes of continuous aerobic without signs/symptoms of physical distress      Intensity   THRR 40-80% of Max Heartrate 63-126    Ratings of Perceived Exertion 11-13    Perceived Dyspnea 0-4      Progression   Progression Continue progressive overload as per policy without signs/symptoms or physical distress.      Resistance Training   Training Prescription Yes    Weight 2    Reps 10-15             Perform Capillary Blood Glucose checks as needed.  Exercise Prescription Changes:   Exercise Comments:   Exercise Goals and Review:   Exercise Goals     Row Name 12/27/22 1012  Exercise Goals   Increase Physical Activity Yes       Intervention Provide advice, education, support and counseling about physical activity/exercise needs.;Develop an  individualized exercise prescription for aerobic and resistive training based on initial evaluation findings, risk stratification, comorbidities and participant's personal goals.       Expected Outcomes Short Term: Attend rehab on a regular basis to increase amount of physical activity.;Long Term: Exercising regularly at least 3-5 days a week.;Long Term: Add in home exercise to make exercise part of routine and to increase amount of physical activity.       Increase Strength and Stamina Yes       Intervention Provide advice, education, support and counseling about physical activity/exercise needs.;Develop an individualized exercise prescription for aerobic and resistive training based on initial evaluation findings, risk stratification, comorbidities and participant's personal goals.       Expected Outcomes Short Term: Increase workloads from initial exercise prescription for resistance, speed, and METs.;Short Term: Perform resistance training exercises routinely during rehab and add in resistance training at home;Long Term: Improve cardiorespiratory fitness, muscular endurance and strength as measured by increased METs and functional capacity ( )       Able to understand and use rate of perceived exertion (RPE) scale Yes       Intervention Provide education and explanation on how to use RPE scale       Expected Outcomes Short Term: Able to use RPE daily in rehab to express subjective intensity level;Long Term:  Able to use RPE to guide intensity level when exercising independently       Knowledge and understanding of Target Heart Rate Range (THRR) Yes       Intervention Provide education and explanation of THRR including how the numbers were predicted and where they are located for reference       Expected Outcomes Short Term: Able to state/look up THRR;Short Term: Able to use daily as guideline for intensity in rehab;Long Term: Able to use THRR to govern intensity when exercising independently        Understanding of Exercise Prescription Yes       Intervention Provide education, explanation, and written materials on patient's individual exercise prescription       Expected Outcomes Short Term: Able to explain program exercise prescription;Long Term: Able to explain home exercise prescription to exercise independently                Exercise Goals Re-Evaluation :   Discharge Exercise Prescription (Final Exercise Prescription Changes):   Nutrition:  Target Goals: Understanding of nutrition guidelines, daily intake of sodium 1500mg , cholesterol 200mg , calories 30% from fat and 7% or less from saturated fats, daily to have 5 or more servings of fruits and vegetables.  Biometrics:  Pre Biometrics - 12/27/22 1052       Pre Biometrics   Waist Circumference 46 inches    Hip Circumference 49 inches    Waist to Hip Ratio 0.94 %    Triceps Skinfold 37 mm    % Body Fat 50.5 %    Grip Strength 29 kg    Flexibility 12.5 in    Single Leg Stand 30 seconds              Nutrition Therapy Plan and Nutrition Goals:   Nutrition Assessments:  MEDIFICTS Score Key: >=70 Need to make dietary changes  40-70 Heart Healthy Diet <= 40 Therapeutic Level Cholesterol Diet    Picture Your Plate Scores: <21 Unhealthy dietary pattern with  much room for improvement. 41-50 Dietary pattern unlikely to meet recommendations for good health and room for improvement. 51-60 More healthful dietary pattern, with some room for improvement.  >60 Healthy dietary pattern, although there may be some specific behaviors that could be improved.    Nutrition Goals Re-Evaluation:   Nutrition Goals Re-Evaluation:   Nutrition Goals Discharge (Final Nutrition Goals Re-Evaluation):   Psychosocial: Target Goals: Acknowledge presence or absence of significant depression and/or stress, maximize coping skills, provide positive support system. Participant is able to verbalize types and ability to use  techniques and skills needed for reducing stress and depression.  Initial Review & Psychosocial Screening:  Initial Psych Review & Screening - 12/27/22 1111       Initial Review   Current issues with Current Depression;Current Anxiety/Panic;Current Stress Concerns    Source of Stress Concerns Family;Occupation    Comments Cynthia Patton shared that she currently has high levels of depression/anxiety/stress.  Mostly related to work and familial strains and church. She used to take medication but recently stopped, wants to possibly resume. She is interested in seeing a counselor, will discuss with RN. Cynthia Patton does not have a PCP and has been looking for a new one, encouraged to discuss medications with PCP once established. Cynthia Patton stated she is not having any thoughts of harming herself or others, this has been an ongoing feeling since 2020.      Family Dynamics   Good Support System? Yes   Has her brother, but stated he is unreliable     Barriers   Psychosocial barriers to participate in program The patient should benefit from training in stress management and relaxation.      Screening Interventions   Interventions Provide feedback about the scores to participant;To provide support and resources with identified psychosocial needs;Encouraged to exercise    Expected Outcomes Long Term goal: The participant improves quality of Life and PHQ9 Scores as seen by post scores and/or verbalization of changes;Short Term goal: Identification and review with participant of any Quality of Life or Depression concerns found by scoring the questionnaire.;Long Term Goal: Stressors or current issues are controlled or eliminated.;Short Term goal: Utilizing psychosocial counselor, staff and physician to assist with identification of specific Stressors or current issues interfering with healing process. Setting desired goal for each stressor or current issue identified.             Quality of Life Scores:  Quality of Life -  12/27/22 1258       Quality of Life   Select Quality of Life      Quality of Life Scores   Health/Function Pre 17.54 %    Socioeconomic Pre 23.9 %    Psych/Spiritual Pre 20.36 %    Family Pre 15 %    GLOBAL Pre 19.2 %            Scores of 19 and below usually indicate a poorer quality of life in these areas.  A difference of  2-3 points is a clinically meaningful difference.  A difference of 2-3 points in the total score of the Quality of Life Index has been associated with significant improvement in overall quality of life, self-image, physical symptoms, and general health in studies assessing change in quality of life.  PHQ-9: Review Flowsheet  More data exists      12/27/2022 09/10/2021 06/30/2021 04/09/2021 02/09/2021  Depression screen PHQ 2/9  Decreased Interest 0 1 0 0 1  Down, Depressed, Hopeless 1 1 1  0 1  PHQ - 2 Score 1 2 1  0 2  Altered sleeping 0 1 1 0 0  Tired, decreased energy 2 1 1  0 0  Change in appetite 0 - 1 - 1  Feeling bad or failure about yourself  0 1 0 0 0  Trouble concentrating 0 0 0 0 0  Moving slowly or fidgety/restless 0 0 0 0 0  Suicidal thoughts 0 0 0 0 0  PHQ-9 Score 3 5 4  0 3  Difficult doing work/chores Somewhat difficult - Not difficult at all - -   Interpretation of Total Score  Total Score Depression Severity:  1-4 = Minimal depression, 5-9 = Mild depression, 10-14 = Moderate depression, 15-19 = Moderately severe depression, 20-27 = Severe depression   Psychosocial Evaluation and Intervention:   Psychosocial Re-Evaluation:   Psychosocial Discharge (Final Psychosocial Re-Evaluation):   Vocational Rehabilitation: Provide vocational rehab assistance to qualifying candidates.   Vocational Rehab Evaluation & Intervention:  Vocational Rehab - 12/27/22 1117       Initial Vocational Rehab Evaluation & Intervention   Assessment shows need for Vocational Rehabilitation No   Cynthia Patton is working            Education: Education Goals:  Education classes will be provided on a weekly basis, covering required topics. Participant will state understanding/return demonstration of topics presented.     Core Videos: Exercise    Move It!  Clinical staff conducted group or individual video education with verbal and written material and guidebook.  Patient learns the recommended Pritikin exercise program. Exercise with the goal of living a long, healthy life. Some of the health benefits of exercise include controlled diabetes, healthier blood pressure levels, improved cholesterol levels, improved heart and lung capacity, improved sleep, and better body composition. Everyone should speak with their doctor before starting or changing an exercise routine.  Biomechanical Limitations Clinical staff conducted group or individual video education with verbal and written material and guidebook.  Patient learns how biomechanical limitations can impact exercise and how we can mitigate and possibly overcome limitations to have an impactful and balanced exercise routine.  Body Composition Clinical staff conducted group or individual video education with verbal and written material and guidebook.  Patient learns that body composition (ratio of muscle mass to fat mass) is a key component to assessing overall fitness, rather than body weight alone. Increased fat mass, especially visceral belly fat, can put [place] at increased risk for metabolic syndrome, type 2 diabetes, heart disease, and even death. It is recommended to combine diet and exercise (cardiovascular and resistance training) to improve your body composition. Seek guidance from your physician and exercise physiologist before implementing an exercise routine.  Exercise Action Plan Clinical staff conducted group or individual video education with verbal and written material and guidebook.  Patient learns the recommended strategies to achieve and enjoy long-term exercise adherence, including  variety, self-motivation, self-efficacy, and positive decision making. Benefits of exercise include fitness, good health, weight management, more energy, better sleep, less stress, and overall well-being.  Medical   Heart Disease Risk Reduction Clinical staff conducted group or individual video education with verbal and written material and guidebook.  Patient learns our heart is our most vital organ as it circulates oxygen, nutrients, white blood cells, and hormones throughout the entire body, and carries waste away. Data supports a plant-based eating plan like the Pritikin Program for its effectiveness in slowing progression of and reversing heart disease. The video provides a number of recommendations to address heart  disease.   Metabolic Syndrome and Belly Fat  Clinical staff conducted group or individual video education with verbal and written material and guidebook.  Patient learns what metabolic syndrome is, how it leads to heart disease, and how one can reverse it and keep it from coming back. You have metabolic syndrome if you have 3 of the following 5 criteria: abdominal obesity, high blood pressure, high triglycerides, low HDL cholesterol, and high blood sugar.  Hypertension and Heart Disease Clinical staff conducted group or individual video education with verbal and written material and guidebook.  Patient learns that high blood pressure, or hypertension, is very common in the [place]. Hypertension is largely due to excessive salt intake, but other important risk factors include being overweight, physical inactivity, drinking too much alcohol, smoking, and not eating enough potassium from fruits and vegetables. High blood pressure is a leading risk factor for heart attack, stroke, congestive heart failure, dementia, kidney failure, and premature death. Long-term effects of excessive salt intake include stiffening of the arteries and thickening of heart muscle and organ damage.  Recommendations include ways to reduce hypertension and the risk of heart disease.  Diseases of Our Time - Focusing on Diabetes Clinical staff conducted group or individual video education with verbal and written material and guidebook.  Patient learns why the best way to stop diseases of our time is prevention, through food and other lifestyle changes. Medicine (such as prescription pills and surgeries) is often only a Band-Aid on the problem, not a long-term solution. Most common diseases of our time include obesity, type 2 diabetes, hypertension, heart disease, and cancer. The Pritikin Program is recommended and has been proven to help reduce, reverse, and/or prevent the damaging effects of metabolic syndrome.  Nutrition   Overview of the Pritikin Eating Plan  Clinical staff conducted group or individual video education with verbal and written material and guidebook.  Patient learns about the Pritikin Eating Plan for disease risk reduction. The Pritikin Eating Plan emphasizes a wide variety of unrefined, minimally-processed carbohydrates, like fruits, vegetables, whole grains, and legumes. Go, Caution, and Stop food choices are explained. Plant-based and lean animal proteins are emphasized. Rationale provided for low sodium intake for blood pressure control, low added sugars for blood sugar stabilization, and low added fats and oils for coronary artery disease risk reduction and weight management.  Calorie Density  Clinical staff conducted group or individual video education with verbal and written material and guidebook.  Patient learns about calorie density and how it impacts the Pritikin Eating Plan. Knowing the characteristics of the food you choose will help you decide whether those foods will lead to weight gain or weight loss, and whether you want to consume more or less of them. Weight loss is usually a side effect of the Pritikin Eating Plan because of its focus on low calorie-dense  foods.  Label Reading  Clinical staff conducted group or individual video education with verbal and written material and guidebook.  Patient learns about the Pritikin recommended label reading guidelines and corresponding recommendations regarding calorie density, added sugars, sodium content, and whole grains.  Dining Out - Part 1  Clinical staff conducted group or individual video education with verbal and written material and guidebook.  Patient learns that restaurant meals can be sabotaging because they can be so high in calories, fat, sodium, and/or sugar. Patient learns recommended strategies on how to positively address this and avoid unhealthy pitfalls.  Facts on Fats  Clinical staff conducted group or individual  video education with verbal and written material and guidebook.  Patient learns that lifestyle modifications can be just as effective, if not more so, as many medications for lowering your risk of heart disease. A Pritikin lifestyle can help to reduce your risk of inflammation and atherosclerosis (cholesterol build-up, or plaque, in the artery walls). Lifestyle interventions such as dietary choices and physical activity address the cause of atherosclerosis. A review of the types of fats and their impact on blood cholesterol levels, along with dietary recommendations to reduce fat intake is also included.  Nutrition Action Plan  Clinical staff conducted group or individual video education with verbal and written material and guidebook.  Patient learns how to incorporate Pritikin recommendations into their lifestyle. Recommendations include planning and keeping personal health goals in mind as an important part of their success.  Healthy Mind-Set    Healthy Minds, Bodies, Hearts  Clinical staff conducted group or individual video education with verbal and written material and guidebook.  Patient learns how to identify when they are stressed. Video will discuss the impact of that  stress, as well as the many benefits of stress management. Patient will also be introduced to stress management techniques. The way we think, act, and feel has an impact on our hearts.  How Our Thoughts Can Heal Our Hearts  Clinical staff conducted group or individual video education with verbal and written material and guidebook.  Patient learns that negative thoughts can cause depression and anxiety. This can result in negative lifestyle behavior and serious health problems. Cognitive behavioral therapy is an effective method to help control our thoughts in order to change and improve our emotional outlook.  Additional Videos:  Exercise    Improving Performance  Clinical staff conducted group or individual video education with verbal and written material and guidebook.  Patient learns to use a non-linear approach by alternating intensity levels and lengths of time spent exercising to help burn more calories and lose more body fat. Cardiovascular exercise helps improve heart health, metabolism, hormonal balance, blood sugar control, and recovery from fatigue. Resistance training improves strength, endurance, balance, coordination, reaction time, metabolism, and muscle mass. Flexibility exercise improves circulation, posture, and balance. Seek guidance from your physician and exercise physiologist before implementing an exercise routine and learn your capabilities and proper form for all exercise.  Introduction to Yoga  Clinical staff conducted group or individual video education with verbal and written material and guidebook.  Patient learns about yoga, a discipline of the coming together of mind, breath, and body. The benefits of yoga include improved flexibility, improved range of motion, better posture and core strength, increased lung function, weight loss, and positive self-image. Yoga's heart health benefits include lowered blood pressure, healthier heart rate, decreased cholesterol and  triglyceride levels, improved immune function, and reduced stress. Seek guidance from your physician and exercise physiologist before implementing an exercise routine and learn your capabilities and proper form for all exercise.  Medical   Aging: Enhancing Your Quality of Life  Clinical staff conducted group or individual video education with verbal and written material and guidebook.  Patient learns key strategies and recommendations to stay in good physical health and enhance quality of life, such as prevention strategies, having an advocate, securing a Health Care Proxy and Power of Attorney, and keeping a list of medications and system for tracking them. It also discusses how to avoid risk for bone loss.  Biology of Weight Control  Clinical staff conducted group or individual video education with  verbal and written material and guidebook.  Patient learns that weight gain occurs because we consume more calories than we burn (eating more, moving less). Even if your body weight is normal, you may have higher ratios of fat compared to muscle mass. Too much body fat puts you at increased risk for cardiovascular disease, heart attack, stroke, type 2 diabetes, and obesity-related cancers. In addition to exercise, following the Pritikin Eating Plan can help reduce your risk.  Decoding Lab Results  Clinical staff conducted group or individual video education with verbal and written material and guidebook.  Patient learns that lab test reflects one measurement whose values change over time and are influenced by many factors, including medication, stress, sleep, exercise, food, hydration, pre-existing medical conditions, and more. It is recommended to use the knowledge from this video to become more involved with your lab results and evaluate your numbers to speak with your doctor.   Diseases of Our Time - Overview  Clinical staff conducted group or individual video education with verbal and written  material and guidebook.  Patient learns that according to the CDC, 50% to 70% of chronic diseases (such as obesity, type 2 diabetes, elevated lipids, hypertension, and heart disease) are avoidable through lifestyle improvements including healthier food choices, listening to satiety cues, and increased physical activity.  Sleep Disorders Clinical staff conducted group or individual video education with verbal and written material and guidebook.  Patient learns how good quality and duration of sleep are important to overall health and well-being. Patient also learns about sleep disorders and how they impact health along with recommendations to address them, including discussing with a physician.  Nutrition  Dining Out - Part 2 Clinical staff conducted group or individual video education with verbal and written material and guidebook.  Patient learns how to plan ahead and communicate in order to maximize their dining experience in a healthy and nutritious manner. Included are recommended food choices based on the type of restaurant the patient is visiting.   Fueling a Banker conducted group or individual video education with verbal and written material and guidebook.  There is a strong connection between our food choices and our health. Diseases like obesity and type 2 diabetes are very prevalent and are in large-part due to lifestyle choices. The Pritikin Eating Plan provides plenty of food and hunger-curbing satisfaction. It is easy to follow, affordable, and helps reduce health risks.  Menu Workshop  Clinical staff conducted group or individual video education with verbal and written material and guidebook.  Patient learns that restaurant meals can sabotage health goals because they are often packed with calories, fat, sodium, and sugar. Recommendations include strategies to plan ahead and to communicate with the manager, chef, or server to help order a healthier  meal.  Planning Your Eating Strategy  Clinical staff conducted group or individual video education with verbal and written material and guidebook.  Patient learns about the Pritikin Eating Plan and its benefit of reducing the risk of disease. The Pritikin Eating Plan does not focus on calories. Instead, it emphasizes high-quality, nutrient-rich foods. By knowing the characteristics of the foods, we choose, we can determine their calorie density and make informed decisions.  Targeting Your Nutrition Priorities  Clinical staff conducted group or individual video education with verbal and written material and guidebook.  Patient learns that lifestyle habits have a tremendous impact on disease risk and progression. This video provides eating and physical activity recommendations based on your personal health  goals, such as reducing LDL cholesterol, losing weight, preventing or controlling type 2 diabetes, and reducing high blood pressure.  Vitamins and Minerals  Clinical staff conducted group or individual video education with verbal and written material and guidebook.  Patient learns different ways to obtain key vitamins and minerals, including through a recommended healthy diet. It is important to discuss all supplements you take with your doctor.   Healthy Mind-Set    Smoking Cessation  Clinical staff conducted group or individual video education with verbal and written material and guidebook.  Patient learns that cigarette smoking and tobacco addiction pose a serious health risk which affects millions of people. Stopping smoking will significantly reduce the risk of heart disease, lung disease, and many forms of cancer. Recommended strategies for quitting are covered, including working with your doctor to develop a successful plan.  Culinary   Becoming a Set designer conducted group or individual video education with verbal and written material and guidebook.  Patient learns  that cooking at home can be healthy, cost-effective, quick, and puts them in control. Keys to cooking healthy recipes will include looking at your recipe, assessing your equipment needs, planning ahead, making it simple, choosing cost-effective seasonal ingredients, and limiting the use of added fats, salts, and sugars.  Cooking - Breakfast and Snacks  Clinical staff conducted group or individual video education with verbal and written material and guidebook.  Patient learns how important breakfast is to satiety and nutrition through the entire day. Recommendations include key foods to eat during breakfast to help stabilize blood sugar levels and to prevent overeating at meals later in the day. Planning ahead is also a key component.  Cooking - Educational psychologist conducted group or individual video education with verbal and written material and guidebook.  Patient learns eating strategies to improve overall health, including an approach to cook more at home. Recommendations include thinking of animal protein as a side on your plate rather than center stage and focusing instead on lower calorie dense options like vegetables, fruits, whole grains, and plant-based proteins, such as beans. Making sauces in large quantities to freeze for later and leaving the skin on your vegetables are also recommended to maximize your experience.  Cooking - Healthy Salads and Dressing Clinical staff conducted group or individual video education with verbal and written material and guidebook.  Patient learns that vegetables, fruits, whole grains, and legumes are the foundations of the Pritikin Eating Plan. Recommendations include how to incorporate each of these in flavorful and healthy salads, and how to create homemade salad dressings. Proper handling of ingredients is also covered. Cooking - Soups and [employer] - Soups and Desserts Clinical staff conducted group or individual video education with  verbal and written material and guidebook.  Patient learns that Pritikin soups and desserts make for easy, nutritious, and delicious snacks and meal components that are low in sodium, fat, sugar, and calorie density, while high in vitamins, minerals, and filling fiber. Recommendations include simple and healthy ideas for soups and desserts.   Overview     The Pritikin Solution Program Overview Clinical staff conducted group or individual video education with verbal and written material and guidebook.  Patient learns that the results of the Pritikin Program have been documented in more than 100 articles published in peer-reviewed journals, and the benefits include reducing risk factors for (and, in some cases, even reversing) high cholesterol, high blood pressure, type 2 diabetes, obesity, and more!  An overview of the three key pillars of the Pritikin Program will be covered: eating well, doing regular exercise, and having a healthy mind-set.  WORKSHOPS  Exercise: Exercise Basics: Building Your Action Plan Clinical staff led group instruction and group discussion with PowerPoint presentation and patient guidebook. To enhance the learning environment the use of posters, models and videos may be added. At the conclusion of this workshop, patients will comprehend the difference between physical activity and exercise, as well as the benefits of incorporating both, into their routine. Patients will understand the FITT (Frequency, Intensity, Time, and Type) principle and how to use it to build an exercise action plan. In addition, safety concerns and other considerations for exercise and cardiac rehab will be addressed by the presenter. The purpose of this lesson is to promote a comprehensive and effective weekly exercise routine in order to improve patients' overall level of fitness.   Managing Heart Disease: Your Path to a Healthier Heart Clinical staff led group instruction and group discussion with  PowerPoint presentation and patient guidebook. To enhance the learning environment the use of posters, models and videos may be added.At the conclusion of this workshop, patients will understand the anatomy and physiology of the heart. Additionally, they will understand how Pritikin's three pillars impact the risk factors, the progression, and the management of heart disease.  The purpose of this lesson is to provide a high-level overview of the heart, heart disease, and how the Pritikin lifestyle positively impacts risk factors.  Exercise Biomechanics Clinical staff led group instruction and group discussion with PowerPoint presentation and patient guidebook. To enhance the learning environment the use of posters, models and videos may be added. Patients will learn how the structural parts of their bodies function and how these functions impact their daily activities, movement, and exercise. Patients will learn how to promote a neutral spine, learn how to manage pain, and identify ways to improve their physical movement in order to promote healthy living. The purpose of this lesson is to expose patients to common physical limitations that impact physical activity. Participants will learn practical ways to adapt and manage aches and pains, and to minimize their effect on regular exercise. Patients will learn how to maintain good posture while sitting, walking, and lifting.  Balance Training and Fall Prevention  Clinical staff led group instruction and group discussion with PowerPoint presentation and patient guidebook. To enhance the learning environment the use of posters, models and videos may be added. At the conclusion of this workshop, patients will understand the importance of their sensorimotor skills (vision, proprioception, and the vestibular system) in maintaining their ability to balance as they age. Patients will apply a variety of balancing exercises that are appropriate for their  current level of function. Patients will understand the common causes for poor balance, possible solutions to these problems, and ways to modify their physical environment in order to minimize their fall risk. The purpose of this lesson is to teach patients about the importance of maintaining balance as they age and ways to minimize their risk of falling.  WORKSHOPS   Nutrition:  Fueling a Ship broker led group instruction and group discussion with PowerPoint presentation and patient guidebook. To enhance the learning environment the use of posters, models and videos may be added. Patients will review the foundational principles of the Pritikin Eating Plan and understand what constitutes a serving size in each of the food groups. Patients will also learn Pritikin-friendly foods that are better choices when  away from home and review make-ahead meal and snack options. Calorie density will be reviewed and applied to three nutrition priorities: weight maintenance, weight loss, and weight gain. The purpose of this lesson is to reinforce (in a group setting) the key concepts around what patients are recommended to eat and how to apply these guidelines when away from home by planning and selecting Pritikin-friendly options. Patients will understand how calorie density may be adjusted for different weight management goals.  Mindful Eating  Clinical staff led group instruction and group discussion with PowerPoint presentation and patient guidebook. To enhance the learning environment the use of posters, models and videos may be added. Patients will briefly review the concepts of the Pritikin Eating Plan and the importance of low-calorie dense foods. The concept of mindful eating will be introduced as well as the importance of paying attention to internal hunger signals. Triggers for non-hunger eating and techniques for dealing with triggers will be explored. The purpose of this lesson is to provide  patients with the opportunity to review the basic principles of the Pritikin Eating Plan, discuss the value of eating mindfully and how to measure internal cues of hunger and fullness using the Hunger Scale. Patients will also discuss reasons for non-hunger eating and learn strategies to use for controlling emotional eating.  Targeting Your Nutrition Priorities Clinical staff led group instruction and group discussion with PowerPoint presentation and patient guidebook. To enhance the learning environment the use of posters, models and videos may be added. Patients will learn how to determine their genetic susceptibility to disease by reviewing their family history. Patients will gain insight into the importance of diet as part of an overall healthy lifestyle in mitigating the impact of genetics and other environmental insults. The purpose of this lesson is to provide patients with the opportunity to assess their personal nutrition priorities by looking at their family history, their own health history and current risk factors. Patients will also be able to discuss ways of prioritizing and modifying the Pritikin Eating Plan for their highest risk areas  Menu  Clinical staff led group instruction and group discussion with PowerPoint presentation and patient guidebook. To enhance the learning environment the use of posters, models and videos may be added. Using menus brought in from [employer], or printed from [employer], patients will apply the Pritikin dining out guidelines that were presented in the [employer] video. Patients will also be able to practice these guidelines in a variety of provided scenarios. The purpose of this lesson is to provide patients with the opportunity to practice hands-on learning of the Pritikin Dining Out guidelines with actual menus and practice scenarios.  Label Reading Clinical staff led group instruction and group discussion with PowerPoint  presentation and patient guidebook. To enhance the learning environment the use of posters, models and videos may be added. Patients will review and discuss the Pritikin label reading guidelines presented in Pritikin's Label Reading Educational series video. Using fool labels brought in from local grocery stores and markets, patients will apply the label reading guidelines and determine if the packaged food meet the Pritikin guidelines. The purpose of this lesson is to provide patients with the opportunity to review, discuss, and practice hands-on learning of the Pritikin Label Reading guidelines with actual packaged food labels. Cooking School  Pritikin's [employer] are designed to teach patients ways to prepare quick, simple, and affordable recipes at home. The importance of nutrition's role in chronic disease risk reduction is  reflected in its emphasis in the overall Pritikin program. By learning how to prepare essential core Pritikin Eating Plan recipes, patients will increase control over what they eat; be able to customize the flavor of foods without the use of added salt, sugar, or fat; and improve the quality of the food they consume. By learning a set of core recipes which are easily assembled, quickly prepared, and affordable, patients are more likely to prepare more healthy foods at home. These workshops focus on convenient breakfasts, simple entres, side dishes, and desserts which can be prepared with minimal effort and are consistent with nutrition recommendations for cardiovascular risk reduction. Cooking [employer] are taught by a Armed forces logistics/support/administrative officer (RD) who has been trained by the [employer]. The chef or RD has a clear understanding of the importance of minimizing - if not completely eliminating - added fat, sugar, and sodium in recipes. Throughout the series of Cooking School Workshop sessions, patients will learn about healthy ingredients and  efficient methods of cooking to build confidence in their capability to prepare    Cooking School weekly topics:  Adding Flavor- Sodium-Free  Fast and Healthy Breakfasts  Powerhouse Plant-Based Proteins  Satisfying Salads and Dressings  Simple Sides and Sauces  International Cuisine-Spotlight on the [employer] Zones  Delicious Desserts  Savory Soups  [employer] - Meals in a Astronomer Appetizers and Snacks  Comforting Weekend Breakfasts  One-Pot Wonders   Fast Evening Meals  Landscape architect Your Pritikin Plate  WORKSHOPS   Healthy Mindset (Psychosocial):  Focused Goals, Sustainable Changes Clinical staff led group instruction and group discussion with PowerPoint presentation and patient guidebook. To enhance the learning environment the use of posters, models and videos may be added. Patients will be able to apply effective goal setting strategies to establish at least one personal goal, and then take consistent, meaningful action toward that goal. They will learn to identify common barriers to achieving personal goals and develop strategies to overcome them. Patients will also gain an understanding of how our mind-set can impact our ability to achieve goals and the importance of cultivating a positive and growth-oriented mind-set. The purpose of this lesson is to provide patients with a deeper understanding of how to set and achieve personal goals, as well as the tools and strategies needed to overcome common obstacles which may arise along the way.  From Head to Heart: The Power of a Healthy Outlook  Clinical staff led group instruction and group discussion with PowerPoint presentation and patient guidebook. To enhance the learning environment the use of posters, models and videos may be added. Patients will be able to recognize and describe the impact of emotions and mood on physical health. They will discover the importance of self-care and explore self-care  practices which may work for them. Patients will also learn how to utilize the 4 C's to cultivate a healthier outlook and better manage stress and challenges. The purpose of this lesson is to demonstrate to patients how a healthy outlook is an essential part of maintaining good health, especially as they continue their cardiac rehab journey.  Healthy Sleep for a Healthy Heart Clinical staff led group instruction and group discussion with PowerPoint presentation and patient guidebook. To enhance the learning environment the use of posters, models and videos may be added. At the conclusion of this workshop, patients will be able to demonstrate knowledge of the importance of sleep to overall health, well-being, and quality of life. They  will understand the symptoms of, and treatments for, common sleep disorders. Patients will also be able to identify daytime and nighttime behaviors which impact sleep, and they will be able to apply these tools to help manage sleep-related challenges. The purpose of this lesson is to provide patients with a general overview of sleep and outline the importance of quality sleep. Patients will learn about a few of the most common sleep disorders. Patients will also be introduced to the concept of "sleep hygiene," and discover ways to self-manage certain sleeping problems through simple daily behavior changes. Finally, the workshop will motivate patients by clarifying the links between quality sleep and their goals of heart-healthy living.   Recognizing and Reducing Stress Clinical staff led group instruction and group discussion with PowerPoint presentation and patient guidebook. To enhance the learning environment the use of posters, models and videos may be added. At the conclusion of this workshop, patients will be able to understand the types of stress reactions, differentiate between acute and chronic stress, and recognize the impact that chronic stress has on their health. They  will also be able to apply different coping mechanisms, such as reframing negative self-talk. Patients will have the opportunity to practice a variety of stress management techniques, such as deep abdominal breathing, progressive muscle relaxation, and/or guided imagery.  The purpose of this lesson is to educate patients on the role of stress in their lives and to provide healthy techniques for coping with it.  Learning Barriers/Preferences:  Learning Barriers/Preferences - 12/27/22 1117       Learning Barriers/Preferences   Learning Barriers Sight   glasses   Learning Preferences Audio;Computer/Internet;Group Instruction;Individual Instruction;Pictoral;Skilled Demonstration;Video;Written Material;Verbal Instruction             Education Topics:  Knowledge Questionnaire Score:  Knowledge Questionnaire Score - 12/27/22 1117       Knowledge Questionnaire Score   Pre Score 26/28             Core Components/Risk Factors/Patient Goals at Admission:  Personal Goals and Risk Factors at Admission - 12/27/22 1012       Core Components/Risk Factors/Patient Goals on Admission    Weight Management Yes;Obesity;Weight Loss    Intervention Weight Management: Provide education and appropriate resources to help participant work on and attain dietary goals.;Weight Management: Develop a combined nutrition and exercise program designed to reach desired caloric intake, while maintaining appropriate intake of nutrient and fiber, sodium and fats, and appropriate energy expenditure required for the weight goal.;Weight Management/Obesity: Establish reasonable short term and long term weight goals.;Obesity: Provide education and appropriate resources to help participant work on and attain dietary goals.    Expected Outcomes Short Term: Continue to assess and modify interventions until short term weight is achieved;Long Term: Adherence to nutrition and physical activity/exercise program aimed toward  attainment of established weight goal;Weight Loss: Understanding of general recommendations for a balanced deficit meal plan, which promotes 1-2 lb weight loss per week and includes a negative energy balance of [phone] kcal/d;Understanding recommendations for meals to include 15-35% energy as protein, 25-35% energy from fat, 35-60% energy from carbohydrates, less than 200mg  of dietary cholesterol, 20-35 gm of total fiber daily;Understanding of distribution of calorie intake throughout the day with the consumption of 4-5 meals/snacks    Tobacco Cessation Yes    Number of packs per day 2 cigarettes    Intervention Assist the participant in steps to quit. Provide individualized education and counseling about committing to Tobacco Cessation, relapse prevention, and pharmacological support  that can be provided by physician.;Education officer, environmental, assist with locating and accessing local/national Quit Smoking programs, and support quit date choice.    Expected Outcomes Short Term: Will quit all tobacco product use, adhering to prevention of relapse plan.;Long Term: Complete abstinence from all tobacco products for at least 12 months from quit date.;Short Term: Will demonstrate readiness to quit, by selecting a quit date.    Diabetes Yes    Intervention Provide education about signs/symptoms and action to take for hypo/hyperglycemia.;Provide education about proper nutrition, including hydration, and aerobic/resistive exercise prescription along with prescribed medications to achieve blood glucose in normal ranges: Fasting glucose 65-99 mg/dL    Expected Outcomes Long Term: Attainment of HbA1C < 7%.;Short Term: Participant verbalizes understanding of the signs/symptoms and immediate care of hyper/hypoglycemia, proper foot care and importance of medication, aerobic/resistive exercise and nutrition plan for blood glucose control.    Hypertension Yes    Intervention Provide education on lifestyle modifcations  including regular physical activity/exercise, weight management, moderate sodium restriction and increased consumption of fresh fruit, vegetables, and low fat dairy, alcohol moderation, and smoking cessation.;Monitor prescription use compliance.    Expected Outcomes Short Term: Continued assessment and intervention until BP is < 140/49mm HG in hypertensive participants. < 130/16mm HG in hypertensive participants with diabetes, heart failure or chronic kidney disease.;Long Term: Maintenance of blood pressure at goal levels.    Lipids Yes    Intervention Provide education and support for participant on nutrition & aerobic/resistive exercise along with prescribed medications to achieve LDL 70mg , HDL >40mg .    Expected Outcomes Short Term: Participant states understanding of desired cholesterol values and is compliant with medications prescribed. Participant is following exercise prescription and nutrition guidelines.;Long Term: Cholesterol controlled with medications as prescribed, with individualized exercise RX and with personalized nutrition plan. Value goals: LDL < 70mg , HDL > 40 mg.    Stress Yes    Intervention Refer participants experiencing significant psychosocial distress to appropriate mental health specialists for further evaluation and treatment. When possible, include family members and significant others in education/counseling sessions.;Offer individual and/or small group education and counseling on adjustment to heart disease, stress management and health-related lifestyle change. Teach and support self-help strategies.    Expected Outcomes Long Term: Emotional wellbeing is indicated by absence of clinically significant psychosocial distress or social isolation.;Short Term: Participant demonstrates changes in health-related behavior, relaxation and other stress management skills, ability to obtain effective social support, and compliance with psychotropic medications if prescribed.              Core Components/Risk Factors/Patient Goals Review:    Core Components/Risk Factors/Patient Goals at Discharge (Final Review):    ITP Comments:  ITP Comments     Row Name 12/27/22 1011           ITP Comments Dr. Armanda Magic medical director. Introduction to [facility] education/intensive cardiac rehab. Initial orientation packet reviewed with patient.                Comments: Participant attended orientation for the cardiac rehabilitation program on  12/27/2022  to perform initial intake and exercise walk test. Patient introduced to the [facility] education and orientation packet was reviewed. Completed 6-minute walk test, measurements, initial ITP, and exercise prescription. Vital signs stable. Telemetry-normal sinus rhythm, asymptomatic. Pt c/o R thumb pain while in room, examined by RN Carlette see note for details. Pt must see urgent care before beginning exercise on 12/23.   Service time was from 1015 to 1200.  Jonna Coup, MS, ACSM-CEP 12/27/2022 1:06 PM

## 2022-12-27 NOTE — Progress Notes (Signed)
Cardiac Rehab Medication Review   Does the patient  feel that his/her medications are working for him/her?  YES  Has the patient been experiencing any side effects to the medications prescribed?  NO  Does the patient measure his/her own blood pressure or blood glucose at home?  NO   Does the patient have any problems obtaining medications due to transportation or finances?   NO  Understanding of regimen: good Understanding of indications: excellent Potential of compliance: excellent    Comments: Cynthia Patton understands her medications and regime well. She does not have a BP cuff at home, encouraged to get one.     Cynthia Coup, MS, ACSM-CEP 12/27/2022 11:19 AM
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
## 2023-01-03 ENCOUNTER — Ambulatory Visit (HOSPITAL_COMMUNITY): Payer: Managed Care, Other (non HMO)

## 2023-01-03 ENCOUNTER — Encounter (HOSPITAL_COMMUNITY): Payer: Commercial Managed Care - HMO

## 2023-01-03 ENCOUNTER — Telehealth (HOSPITAL_COMMUNITY): Payer: Self-pay | Admitting: *Deleted

## 2023-01-03 NOTE — Telephone Encounter (Signed)
Called to check in with pt regarding seeing provider about her thumb that she believes in broken per pt during the orientation appt last week.  Call went straight to voicemail, message left to please contact [facility] prior to coming in for her afternoon exercise class. Alanson Aly, BSN Cardiac and Emergency planning/management officer

## 2023-01-07 ENCOUNTER — Encounter (HOSPITAL_COMMUNITY)
Admission: RE | Admit: 2023-01-07 | Discharge: 2023-01-07 | Disposition: A | Discharge status: Home / Self Care | Payer: Medicaid Other | Source: Ambulatory Visit | Attending: Cardiovascular Disease | Admitting: Cardiovascular Disease

## 2023-01-07 ENCOUNTER — Ambulatory Visit (HOSPITAL_COMMUNITY): Payer: Managed Care, Other (non HMO)
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
## 2023-01-10 ENCOUNTER — Ambulatory Visit (HOSPITAL_COMMUNITY): Payer: Managed Care, Other (non HMO)

## 2023-01-10 ENCOUNTER — Encounter (HOSPITAL_COMMUNITY)
Admission: RE | Admit: 2023-01-10 | Discharge: 2023-01-10 | Disposition: A | Discharge status: Home / Self Care | Payer: Medicaid Other | Source: Ambulatory Visit | Attending: Cardiovascular Disease | Admitting: Cardiovascular Disease

## 2023-01-10 DIAGNOSIS — Z955 Presence of coronary angioplasty implant and graft: Secondary | ICD-10-CM

## 2023-01-10 DIAGNOSIS — Z5189 Encounter for other specified aftercare: Secondary | ICD-10-CM | POA: Diagnosis not present

## 2023-01-10 LAB — GLUCOSE, CAPILLARY
Glucose-Capillary: 90 mg/dL (ref 70–99)
Glucose-Capillary: 111 mg/dL — ABNORMAL HIGH (ref 70–99)

## 2023-01-10 NOTE — Progress Notes (Signed)
Daily Session Note  Patient Details  Name: Cynthia Patton MRN: [id number] Date of Birth: 1960-03-12 Referring Provider:   Flowsheet Row CARDIAC REHAB PHASE II ORIENTATION from 12/27/2022 in [facility]  Referring Provider Tonny Bollman, MD       Encounter Date: 01/10/2023  Check In:  Session Check In - 01/10/23 1423       Check-In   Supervising physician immediately available to respond to emergencies [facility] - Physician supervision    Physician(s) Jari Favre, PA    Staff Present Valinda Party, MS, Exercise Physiologist;Johnny Hale Bogus, MS, Exercise Physiologist;Jacquelyne Quarry, RN, Marton Redwood, MS, ACSM-CEP, CCRP, Exercise Physiologist    Virtual Visit No    Medication changes reported     No    Fall or balance concerns reported    No    Tobacco Cessation No Change    Warm-up and Cool-down Performed as group-led instruction    Resistance Training Performed Yes    VAD Patient? No    PAD/SET Patient? No      Pain Assessment   Currently in Pain? No/denies    Pain Score 0-No pain    Multiple Pain Sites No             Capillary Blood Glucose: Results for orders placed or performed during the hospital encounter of 01/10/23 (from the past 24 hours)  Glucose, capillary     Status: Abnormal   Collection Time: 01/10/23  3:49 PM  Result Value Ref Range   Glucose-Capillary 111 (H) 70 - 99 mg/dL     Exercise Prescription Changes - 01/10/23 1600       Response to Exercise   Blood Pressure (Admit) 122/80    Blood Pressure (Exercise) 152/84    Blood Pressure (Exit) 146/84    Heart Rate (Admit) 69 bpm    Heart Rate (Exercise) 111 bpm    Heart Rate (Exit) 81 bpm    Rating of Perceived Exertion (Exercise) 10    Perceived Dyspnea (Exercise) 0    Symptoms None    Comments Pt first day in CRP2    Duration Continue with 30 min of aerobic exercise without signs/symptoms of physical distress.    Intensity THRR  unchanged      Progression   Progression Continue to progress workloads to maintain intensity without signs/symptoms of physical distress.    Average METs 2.3      Resistance Training   Training Prescription Yes    Weight 2    Reps 10-15      Arm Ergometer   Level 1    Watts 16    Minutes 15    METs 1.6      Recumbant Elliptical   Level 1    RPM 54    Watts 78    Minutes 15    METs 3             Social History   Tobacco Use  Smoking Status Every Day   Current packs/day: 0.50   Types: Cigarettes  Smokeless Tobacco Never    Goals Met:  Exercise tolerated well No report of concerns or symptoms today Strength training completed today  Goals Unmet:  Not Applicable  Comments: Pt started cardiac rehab today.  Pt tolerated light exercise without difficulty. VSS, telemetry-Sinus Rhythm, asymptomatic.  Medication list reconciled. Pt denies barriers to medicaiton compliance.  PSYCHOSOCIAL ASSESSMENT:  PHQ-3. Pt exhibits positive coping skills, hopeful outlook with supportive family.  No psychosocial needs identified at this time, no psychosocial interventions necessary.    Pt enjoys cooking, church, [employer], Christmas and the movies.   Pt oriented to exercise equipment and routine.    Understanding verbalized.Thayer Headings RN BSN    Dr. Armanda Magic is Medical Director for Cardiac Rehab at [facility].

## 2023-01-14 ENCOUNTER — Encounter (HOSPITAL_COMMUNITY)
Admission: RE | Admit: 2023-01-14 | Discharge: 2023-01-14 | Disposition: A | Discharge status: Home / Self Care | Payer: Commercial Managed Care - HMO | Source: Ambulatory Visit | Attending: Cardiovascular Disease | Admitting: Cardiovascular Disease

## 2023-01-14 ENCOUNTER — Ambulatory Visit (HOSPITAL_COMMUNITY): Payer: Managed Care, Other (non HMO)

## 2023-01-14 DIAGNOSIS — Z955 Presence of coronary angioplasty implant and graft: Secondary | ICD-10-CM | POA: Insufficient documentation

## 2023-01-14 LAB — GLUCOSE, CAPILLARY: Glucose-Capillary: 122 mg/dL — ABNORMAL HIGH (ref 70–99)

## 2023-01-17 ENCOUNTER — Encounter (HOSPITAL_COMMUNITY): Payer: Commercial Managed Care - HMO

## 2023-01-17 ENCOUNTER — Ambulatory Visit (HOSPITAL_COMMUNITY): Payer: Managed Care, Other (non HMO)

## 2023-01-18 NOTE — Progress Notes (Signed)
 Cardiac Individual Treatment Plan  Patient Details  Name: Cynthia Patton MRN: [id number] Date of Birth: 02-10-1960 Referring Provider:   Flowsheet Row CARDIAC REHAB PHASE II ORIENTATION from 12/27/2022 in [facility]  Referring Provider Ozell Fell, MD       Initial Encounter Date:  Flowsheet Row CARDIAC REHAB PHASE II ORIENTATION from 12/27/2022 in [facility] for [facility], [facility]ascular, [facility]  Date 12/27/22       Visit Diagnosis: 11/13/22 DES RCA  Patient's Home Medications on Admission:  Current Outpatient Medications:    aspirin  81 MG chewable tablet, Chew 1 tablet (81 mg total) by mouth daily., Disp: 90 tablet, Rfl: 3   atorvastatin  (LIPITOR) 80 MG tablet, Take 1 tablet (80 mg total) by mouth daily., Disp: 30 tablet, Rfl: 2   blood glucose meter kit and supplies KIT, Dispense based on patient and insurance preference. Use up to four times daily as directed. (FOR ICD-9 250.00, 250.01)., Disp: 1 each, Rfl: 0   carvedilol  (COREG ) 6.25 MG tablet, Take 1 tablet (6.25 mg total) by mouth 2 (two) times daily., Disp: 180 tablet, Rfl: 3   fluticasone  (FLONASE ) 50 MCG/ACT nasal spray, Place 2 sprays into both nostrils daily., Disp: 16 g, Rfl: 6   levothyroxine  (SYNTHROID ) 175 MCG tablet, Take 175 mcg by mouth daily before breakfast., Disp: , Rfl:    losartan  (COZAAR ) 100 MG tablet, Take 1 tablet (100 mg total) by mouth daily., Disp: 90 tablet, Rfl: 3   mometasone  (NASONEX ) 50 MCG/ACT nasal spray, Place 2 sprays into the nose daily., Disp: 17 g, Rfl: 1   nitroGLYCERIN  (NITROSTAT ) 0.4 MG SL tablet, Place 1 tablet (0.4 mg total) under the tongue every 5 (five) minutes as needed for chest pain., Disp: 25 tablet, Rfl: 2   ONETOUCH VERIO test strip, USE UP TO 4 TIMES DAILY AS DIRECTED, Disp: , Rfl:    ticagrelor  (BRILINTA ) 90 MG TABS tablet, Take 1 tablet (90 mg total) by mouth 2 (two) times daily., Disp: 60  tablet, Rfl: 11  Past Medical History: Past Medical History:  Diagnosis Date   Acute low back pain 03/25/2020   Anxiety    Cancer (HCC) 2000?   thyroid  cancer   Depression    Diabetes mellitus without complication (HCC)    Hyperlipidemia    Hypertension    Thyroid  disease     Tobacco Use: Social History   Tobacco Use  Smoking Status Every Day   Current packs/day: 0.50   Types: Cigarettes  Smokeless Tobacco Never    Labs: Review Flowsheet  More data exists      Latest Ref Rng & Units 07/27/2019 11/01/2019 02/08/2020 02/09/2021 11/13/2022  Labs for ITP Cardiac and Pulmonary Rehab  Cholestrol 0 - 200 mg/dL 828  - 837  820  824   LDL (calc) 0 - 99 mg/dL 886  - 899  889  893   HDL-C >40 mg/dL 37  - 44  47  41   Trlycerides <150 mg/dL 886  - 97  878  858   Hemoglobin A1c 4.8 - 5.6 % - 6.3  6.2  6.2  6.2  6.2  6.8  6.8  6.8  6.8  6.8     Details       Multiple values from one day are sorted in reverse-chronological order         Capillary Blood Glucose: Lab Results  Component Value Date  GLUCAP 122 (H) 01/14/2023   GLUCAP 111 (H) 01/10/2023   GLUCAP 90 01/10/2023   GLUCAP 126 (H) 11/13/2022   GLUCAP 248 (H) 07/08/2017     Exercise Target Goals: Exercise Program Goal: Individual exercise prescription set using results from initial 6 min walk test and THRR while considering  patient's activity barriers and safety.   Exercise Prescription Goal: Initial exercise prescription builds to 30-45 minutes a day of aerobic activity, 2-3 days per week.  Home exercise guidelines will be given to patient during program as part of exercise prescription that the participant will acknowledge.  Activity Barriers & Risk Stratification:  Activity Barriers & Cardiac Risk Stratification - 12/27/22 1111       Activity Barriers & Cardiac Risk Stratification   Activity Barriers Shortness of Breath;Joint Problems    Cardiac Risk Stratification High   <5 METs on             6 Minute Walk:  6 Minute Walk     Row Name 12/27/22 1256         6 Minute Walk   Phase Initial     Distance 1390 feet     Walk Time 6 minutes     # of Rest Breaks 2  very brief standing rest     MPH 2.64     METS 3     RPE 9     Perceived Dyspnea  0     VO2 Peak 10.51     Symptoms Yes (comment)     Comments 1/10 L calf pain- resolved with rest     Resting HR 70 bpm     Resting BP 146/84     Resting Oxygen Saturation  98 %     Exercise Oxygen Saturation  during 6 min walk 98 %     Max Ex. HR 93 bpm     Max Ex. BP 176/84     2 Minute Post BP 136/82              Oxygen Initial Assessment:   Oxygen Re-Evaluation:   Oxygen Discharge (Final Oxygen Re-Evaluation):   Initial Exercise Prescription:  Initial Exercise Prescription - 12/27/22 1200       Date of Initial Exercise RX and Referring Provider   Date 12/27/22    Referring Provider Ozell Fell, MD    Expected Discharge Date 03/23/23      NuStep   Level 1    SPM 65    Minutes 15    METs 2      Recumbant Elliptical   Level 1    RPM 55    Watts 70    Minutes 15    METs 2.5      Prescription Details   Frequency (times per week) 3    Duration Progress to 30 minutes of continuous aerobic without signs/symptoms of physical distress      Intensity   THRR 40-80% of Max Heartrate 63-126    Ratings of Perceived Exertion 11-13    Perceived Dyspnea 0-4      Progression   Progression Continue progressive overload as per policy without signs/symptoms or physical distress.      Resistance Training   Training Prescription Yes    Weight 2    Reps 10-15             Perform Capillary Blood Glucose checks as needed.  Exercise Prescription Changes:   Exercise Prescription Changes     Row Name  01/10/23 1600             Response to Exercise   Blood Pressure (Admit) 122/80       Blood Pressure (Exercise) 152/84       Blood Pressure (Exit) 146/84       Heart Rate (Admit) 69 bpm        Heart Rate (Exercise) 111 bpm       Heart Rate (Exit) 81 bpm       Rating of Perceived Exertion (Exercise) 10       Perceived Dyspnea (Exercise) 0       Symptoms None       Comments Pt first day in CRP2       Duration Continue with 30 min of aerobic exercise without signs/symptoms of physical distress.       Intensity THRR unchanged         Progression   Progression Continue to progress workloads to maintain intensity without signs/symptoms of physical distress.       Average METs 2.3         Resistance Training   Training Prescription Yes       Weight 2       Reps 10-15         Arm Ergometer   Level 1       Watts 16       Minutes 15       METs 1.6         Recumbant Elliptical   Level 1       RPM 54       Watts 78       Minutes 15       METs 3                Exercise Comments:   Exercise Comments     Row Name 01/10/23 1701           Exercise Comments Pt first day in CRP2 program. Pt tolerated exercise well with an avg MET level of 2.3 with no s/sx. Pt did not like Nustep equipment, so switched to arm ergometer without issue. Pt is learning her THRR, RPE scale, and ExRx.                Exercise Goals and Review:   Exercise Goals     Row Name 12/27/22 1012             Exercise Goals   Increase Physical Activity Yes       Intervention Provide advice, education, support and counseling about physical activity/exercise needs.;Develop an individualized exercise prescription for aerobic and resistive training based on initial evaluation findings, risk stratification, comorbidities and participant's personal goals.       Expected Outcomes Short Term: Attend rehab on a regular basis to increase amount of physical activity.;Long Term: Exercising regularly at least 3-5 days a week.;Long Term: Add in home exercise to make exercise part of routine and to increase amount of physical activity.       Increase Strength and Stamina Yes       Intervention Provide  advice, education, support and counseling about physical activity/exercise needs.;Develop an individualized exercise prescription for aerobic and resistive training based on initial evaluation findings, risk stratification, comorbidities and participant's personal goals.       Expected Outcomes Short Term: Increase workloads from initial exercise prescription for resistance, speed, and METs.;Short Term: Perform resistance training exercises routinely during rehab and add in resistance training  at home;Long Term: Improve cardiorespiratory fitness, muscular endurance and strength as measured by increased METs and functional capacity ( )       Able to understand and use rate of perceived exertion (RPE) scale Yes       Intervention Provide education and explanation on how to use RPE scale       Expected Outcomes Short Term: Able to use RPE daily in rehab to express subjective intensity level;Long Term:  Able to use RPE to guide intensity level when exercising independently       Knowledge and understanding of Target Heart Rate Range (THRR) Yes       Intervention Provide education and explanation of THRR including how the numbers were predicted and where they are located for reference       Expected Outcomes Short Term: Able to state/look up THRR;Short Term: Able to use daily as guideline for intensity in rehab;Long Term: Able to use THRR to govern intensity when exercising independently       Understanding of Exercise Prescription Yes       Intervention Provide education, explanation, and written materials on patient's individual exercise prescription       Expected Outcomes Short Term: Able to explain program exercise prescription;Long Term: Able to explain home exercise prescription to exercise independently                Exercise Goals Re-Evaluation :  Exercise Goals Re-Evaluation     Row Name 01/10/23 1651             Exercise Goal Re-Evaluation   Exercise Goals Review Increase  Physical Activity;Understanding of Exercise Prescription;Increase Strength and Stamina;Knowledge and understanding of Target Heart Rate Range (THRR);Able to understand and use rate of perceived exertion (RPE) scale       Comments Pt first day in CRP2 program. Pt tolerated exercise well with an avg MET level of 2.3 with no s/sx. Pt did not like Nustep equipment, so switched to arm ergometer without issue. Pt is learning her THRR, RPE scale, and ExRx.       Expected Outcomes Will continue to progress workloads as tolerated without s/sx.                Discharge Exercise Prescription (Final Exercise Prescription Changes):  Exercise Prescription Changes - 01/10/23 1600       Response to Exercise   Blood Pressure (Admit) 122/80    Blood Pressure (Exercise) 152/84    Blood Pressure (Exit) 146/84    Heart Rate (Admit) 69 bpm    Heart Rate (Exercise) 111 bpm    Heart Rate (Exit) 81 bpm    Rating of Perceived Exertion (Exercise) 10    Perceived Dyspnea (Exercise) 0    Symptoms None    Comments Pt first day in CRP2    Duration Continue with 30 min of aerobic exercise without signs/symptoms of physical distress.    Intensity THRR unchanged      Progression   Progression Continue to progress workloads to maintain intensity without signs/symptoms of physical distress.    Average METs 2.3      Resistance Training   Training Prescription Yes    Weight 2    Reps 10-15      Arm Ergometer   Level 1    Watts 16    Minutes 15    METs 1.6      Recumbant Elliptical   Level 1    RPM 54    Watts 78  Minutes 15    METs 3             Nutrition:  Target Goals: Understanding of nutrition guidelines, daily intake of sodium 1500mg , cholesterol 200mg , calories 30% from fat and 7% or less from saturated fats, daily to have 5 or more servings of fruits and vegetables.  Biometrics:  Pre Biometrics - 12/27/22 1052       Pre Biometrics   Waist Circumference 46 inches    Hip  Circumference 49 inches    Waist to Hip Ratio 0.94 %    Triceps Skinfold 37 mm    % Body Fat 50.5 %    Grip Strength 29 kg    Flexibility 12.5 in    Single Leg Stand 30 seconds              Nutrition Therapy Plan and Nutrition Goals:   Nutrition Assessments:  MEDIFICTS Score Key: >=70 Need to make dietary changes  40-70 Heart Healthy Diet <= 40 Therapeutic Level Cholesterol Diet   Flowsheet Row CARDIAC REHAB PHASE II EXERCISE from 01/10/2023 in [facility], [facility], [facility]  Picture Your Plate Total Score on Admission 53      Picture Your Plate Scores: <59 Unhealthy dietary pattern with much room for improvement. 41-50 Dietary pattern unlikely to meet recommendations for good health and room for improvement. 51-60 More healthful dietary pattern, with some room for improvement.  >60 Healthy dietary pattern, although there may be some specific behaviors that could be improved.    Nutrition Goals Re-Evaluation:   Nutrition Goals Re-Evaluation:   Nutrition Goals Discharge (Final Nutrition Goals Re-Evaluation):   Psychosocial: Target Goals: Acknowledge presence or absence of significant depression and/or stress, maximize coping skills, provide positive support system. Participant is able to verbalize types and ability to use techniques and skills needed for reducing stress and depression.  Initial Review & Psychosocial Screening:  Initial Psych Review & Screening - 12/27/22 1111       Initial Review   Current issues with Current Depression;Current Anxiety/Panic;Current Stress Concerns    Source of Stress Concerns Family;Occupation    Comments Jaysha shared that she currently has high levels of depression/anxiety/stress.  Mostly related to work and familial strains and church. She used to take medication but recently stopped, wants to possibly resume. She is interested in seeing a counselor, will discuss with RN. Exilda does not  have a PCP and has been looking for a new one, encouraged to discuss medications with PCP once established. Seva stated she is not having any thoughts of harming herself or others, this has been an ongoing feeling since 2020.      Family Dynamics   Good Support System? Yes   Has her brother, but stated he is unreliable     Barriers   Psychosocial barriers to participate in program The patient should benefit from training in stress management and relaxation.      Screening Interventions   Interventions Provide feedback about the scores to participant;To provide support and resources with identified psychosocial needs;Encouraged to exercise    Expected Outcomes Long Term goal: The participant improves quality of Life and PHQ9 Scores as seen by post scores and/or verbalization of changes;Short Term goal: Identification and review with participant of any Quality of Life or Depression concerns found by scoring the questionnaire.;Long Term Goal: Stressors or current issues are controlled or eliminated.;Short Term goal: Utilizing psychosocial counselor, staff and physician to assist with identification of  specific Stressors or current issues interfering with healing process. Setting desired goal for each stressor or current issue identified.             Quality of Life Scores:  Quality of Life - 12/27/22 1258       Quality of Life   Select Quality of Life      Quality of Life Scores   Health/Function Pre 17.54 %    Socioeconomic Pre 23.9 %    Psych/Spiritual Pre 20.36 %    Family Pre 15 %    GLOBAL Pre 19.2 %            Scores of 19 and below usually indicate a poorer quality of life in these areas.  A difference of  2-3 points is a clinically meaningful difference.  A difference of 2-3 points in the total score of the Quality of Life Index has been associated with significant improvement in overall quality of life, self-image, physical symptoms, and general health in studies assessing  change in quality of life.  PHQ-9: Review Flowsheet  More data exists      12/27/2022 09/10/2021 06/30/2021 04/09/2021 02/09/2021  Depression screen PHQ 2/9  Decreased Interest 0 1 0 0 1  Down, Depressed, Hopeless 1 1 1  0 1  PHQ - 2 Score 1 2 1  0 2  Altered sleeping 0 1 1 0 0  Tired, decreased energy 2 1 1  0 0  Change in appetite 0 - 1 - 1  Feeling bad or failure about yourself  0 1 0 0 0  Trouble concentrating 0 0 0 0 0  Moving slowly or fidgety/restless 0 0 0 0 0  Suicidal thoughts 0 0 0 0 0  PHQ-9 Score 3 5 4  0 3  Difficult doing work/chores Somewhat difficult - Not difficult at all - -   Interpretation of Total Score  Total Score Depression Severity:  1-4 = Minimal depression, 5-9 = Mild depression, 10-14 = Moderate depression, 15-19 = Moderately severe depression, 20-27 = Severe depression   Psychosocial Evaluation and Intervention:   Psychosocial Re-Evaluation:  Psychosocial Re-Evaluation     Row Name 01/13/23 [phone]             Psychosocial Re-Evaluation   Current issues with Current Anxiety/Panic;Current Stress Concerns;Current Depression       Comments Wilna did not voice any increased concerns or stressors on her first day of exercise. Will review quality of life and PHQ2-9 in the upcoming week       Expected Outcomes Lachlan will have controlled or decreased depression, anxiety/ panic upon compleition of cardiac rehab       Interventions Stress management education;Encouraged to attend Cardiac Rehabilitation for the exercise;Relaxation education       Continue Psychosocial Services  Follow up required by staff         Initial Review   Source of Stress Concerns Chronic Illness;Family;Occupation       Comments Will continue to monitor and offer support as needed.                Psychosocial Discharge (Final Psychosocial Re-Evaluation):  Psychosocial Re-Evaluation - 01/13/23 0857       Psychosocial Re-Evaluation   Current issues with Current  Anxiety/Panic;Current Stress Concerns;Current Depression    Comments Hailea did not voice any increased concerns or stressors on her first day of exercise. Will review quality of life and PHQ2-9 in the upcoming week    Expected Outcomes Kiante will have controlled  or decreased depression, anxiety/ panic upon compleition of cardiac rehab    Interventions Stress management education;Encouraged to attend Cardiac Rehabilitation for the exercise;Relaxation education    Continue Psychosocial Services  Follow up required by staff      Initial Review   Source of Stress Concerns Chronic Illness;Family;Occupation    Comments Will continue to monitor and offer support as needed.             Vocational Rehabilitation: Provide vocational rehab assistance to qualifying candidates.   Vocational Rehab Evaluation & Intervention:  Vocational Rehab - 12/27/22 1117       Initial Vocational Rehab Evaluation & Intervention   Assessment shows need for Vocational Rehabilitation No   Diasia is working            Education: Education Goals: Education classes will be provided on a weekly basis, covering required topics. Participant will state understanding/return demonstration of topics presented.    Education     Row Name 01/07/23 1400     Education   Cardiac Education Topics Pritikin   Glass Blower/designer Nutrition   Nutrition Workshop Targeting Your Nutrition Priorities   Instruction Review Code 1- Verbalizes Understanding   Class Start Time 1357   Class Stop Time 1436   Class Time Calculation (min) 39 min    Row Name 01/10/23 1600     Education   Cardiac Education Topics Pritikin   Geographical Information Systems Officer Psychosocial   Psychosocial Workshop Focused Goals, Sustainable Changes   Instruction Review Code 1- Verbalizes Understanding   Class Start Time 1400   Class Stop Time 1440   Class Time  Calculation (min) 40 min    Row Name 01/14/23 1400     Education   Cardiac Education Topics Pritikin   Licensed Conveyancer Nutrition   Nutrition Dining Out - Part 1   Instruction Review Code 1- Verbalizes Understanding   Class Start Time 1358   Class Stop Time 1442   Class Time Calculation (min) 44 min            Core Videos: Exercise    Move It!  Clinical staff conducted group or individual video education with verbal and written material and guidebook.  Patient learns the recommended Pritikin exercise program. Exercise with the goal of living a long, healthy life. Some of the health benefits of exercise include controlled diabetes, healthier blood pressure levels, improved cholesterol levels, improved heart and lung capacity, improved sleep, and better body composition. Everyone should speak with their doctor before starting or changing an exercise routine.  Biomechanical Limitations Clinical staff conducted group or individual video education with verbal and written material and guidebook.  Patient learns how biomechanical limitations can impact exercise and how we can mitigate and possibly overcome limitations to have an impactful and balanced exercise routine.  Body Composition Clinical staff conducted group or individual video education with verbal and written material and guidebook.  Patient learns that body composition (ratio of muscle mass to fat mass) is a key component to assessing overall fitness, rather than body weight alone. Increased fat mass, especially visceral belly fat, can put us  at increased risk for metabolic syndrome, type 2 diabetes, heart disease, and even death. It is recommended to combine diet and exercise (cardiovascular and resistance training) to  improve your body composition. Seek guidance from your physician and exercise physiologist before implementing an exercise routine.  Exercise Action  Plan Clinical staff conducted group or individual video education with verbal and written material and guidebook.  Patient learns the recommended strategies to achieve and enjoy long-term exercise adherence, including variety, self-motivation, self-efficacy, and positive decision making. Benefits of exercise include fitness, good health, weight management, more energy, better sleep, less stress, and overall well-being.  Medical   Heart Disease Risk Reduction Clinical staff conducted group or individual video education with verbal and written material and guidebook.  Patient learns our heart is our most vital organ as it circulates oxygen, nutrients, white blood cells, and hormones throughout the entire body, and carries waste away. Data supports a plant-based eating plan like the Pritikin Program for its effectiveness in slowing progression of and reversing heart disease. The video provides a number of recommendations to address heart disease.   Metabolic Syndrome and Belly Fat  Clinical staff conducted group or individual video education with verbal and written material and guidebook.  Patient learns what metabolic syndrome is, how it leads to heart disease, and how one can reverse it and keep it from coming back. You have metabolic syndrome if you have 3 of the following 5 criteria: abdominal obesity, high blood pressure, high triglycerides, low HDL cholesterol, and high blood sugar.  Hypertension and Heart Disease Clinical staff conducted group or individual video education with verbal and written material and guidebook.  Patient learns that high blood pressure, or hypertension, is very common in the [place] . Hypertension is largely due to excessive salt intake, but other important risk factors include being overweight, physical inactivity, drinking too much alcohol, smoking, and not eating enough potassium from fruits and vegetables. High blood pressure is a leading risk factor for heart  attack, stroke, congestive heart failure, dementia, kidney failure, and premature death. Long-term effects of excessive salt intake include stiffening of the arteries and thickening of heart muscle and organ damage. Recommendations include ways to reduce hypertension and the risk of heart disease.  Diseases of Our Time - Focusing on Diabetes Clinical staff conducted group or individual video education with verbal and written material and guidebook.  Patient learns why the best way to stop diseases of our time is prevention, through food and other lifestyle changes. Medicine (such as prescription pills and surgeries) is often only a Band-Aid on the problem, not a long-term solution. Most common diseases of our time include obesity, type 2 diabetes, hypertension, heart disease, and cancer. The Pritikin Program is recommended and has been proven to help reduce, reverse, and/or prevent the damaging effects of metabolic syndrome.  Nutrition   Overview of the Pritikin Eating Plan  Clinical staff conducted group or individual video education with verbal and written material and guidebook.  Patient learns about the Pritikin Eating Plan for disease risk reduction. The Pritikin Eating Plan emphasizes a wide variety of unrefined, minimally-processed carbohydrates, like fruits, vegetables, whole grains, and legumes. Go, Caution, and Stop food choices are explained. Plant-based and lean animal proteins are emphasized. Rationale provided for low sodium intake for blood pressure control, low added sugars for blood sugar stabilization, and low added fats and oils for coronary artery disease risk reduction and weight management.  Calorie Density  Clinical staff conducted group or individual video education with verbal and written material and guidebook.  Patient learns about calorie density and how it impacts the Pritikin Eating Plan. Knowing the characteristics of the food you  choose will help you decide whether those  foods will lead to weight gain or weight loss, and whether you want to consume more or less of them. Weight loss is usually a side effect of the Pritikin Eating Plan because of its focus on low calorie-dense foods.  Label Reading  Clinical staff conducted group or individual video education with verbal and written material and guidebook.  Patient learns about the Pritikin recommended label reading guidelines and corresponding recommendations regarding calorie density, added sugars, sodium content, and whole grains.  Dining Out - Part 1  Clinical staff conducted group or individual video education with verbal and written material and guidebook.  Patient learns that restaurant meals can be sabotaging because they can be so high in calories, fat, sodium, and/or sugar. Patient learns recommended strategies on how to positively address this and avoid unhealthy pitfalls.  Facts on Fats  Clinical staff conducted group or individual video education with verbal and written material and guidebook.  Patient learns that lifestyle modifications can be just as effective, if not more so, as many medications for lowering your risk of heart disease. A Pritikin lifestyle can help to reduce your risk of inflammation and atherosclerosis (cholesterol build-up, or plaque, in the artery walls). Lifestyle interventions such as dietary choices and physical activity address the cause of atherosclerosis. A review of the types of fats and their impact on blood cholesterol levels, along with dietary recommendations to reduce fat intake is also included.  Nutrition Action Plan  Clinical staff conducted group or individual video education with verbal and written material and guidebook.  Patient learns how to incorporate Pritikin recommendations into their lifestyle. Recommendations include planning and keeping personal health goals in mind as an important part of their success.  Healthy Mind-Set    Healthy Minds, Bodies, Hearts   Clinical staff conducted group or individual video education with verbal and written material and guidebook.  Patient learns how to identify when they are stressed. Video will discuss the impact of that stress, as well as the many benefits of stress management. Patient will also be introduced to stress management techniques. The way we think, act, and feel has an impact on our hearts.  How Our Thoughts Can Heal Our Hearts  Clinical staff conducted group or individual video education with verbal and written material and guidebook.  Patient learns that negative thoughts can cause depression and anxiety. This can result in negative lifestyle behavior and serious health problems. Cognitive behavioral therapy is an effective method to help control our thoughts in order to change and improve our emotional outlook.  Additional Videos:  Exercise    Improving Performance  Clinical staff conducted group or individual video education with verbal and written material and guidebook.  Patient learns to use a non-linear approach by alternating intensity levels and lengths of time spent exercising to help burn more calories and lose more body fat. Cardiovascular exercise helps improve heart health, metabolism, hormonal balance, blood sugar control, and recovery from fatigue. Resistance training improves strength, endurance, balance, coordination, reaction time, metabolism, and muscle mass. Flexibility exercise improves circulation, posture, and balance. Seek guidance from your physician and exercise physiologist before implementing an exercise routine and learn your capabilities and proper form for all exercise.  Introduction to Yoga  Clinical staff conducted group or individual video education with verbal and written material and guidebook.  Patient learns about yoga, a discipline of the coming together of mind, breath, and body. The benefits of yoga include improved flexibility, improved  range of motion, better  posture and core strength, increased lung function, weight loss, and positive self-image. Yoga's heart health benefits include lowered blood pressure, healthier heart rate, decreased cholesterol and triglyceride levels, improved immune function, and reduced stress. Seek guidance from your physician and exercise physiologist before implementing an exercise routine and learn your capabilities and proper form for all exercise.  Medical   Aging: Enhancing Your Quality of Life  Clinical staff conducted group or individual video education with verbal and written material and guidebook.  Patient learns key strategies and recommendations to stay in good physical health and enhance quality of life, such as prevention strategies, having an advocate, securing a Health Care Proxy and Power of Attorney, and keeping a list of medications and system for tracking them. It also discusses how to avoid risk for bone loss.  Biology of Weight Control  Clinical staff conducted group or individual video education with verbal and written material and guidebook.  Patient learns that weight gain occurs because we consume more calories than we burn (eating more, moving less). Even if your body weight is normal, you may have higher ratios of fat compared to muscle mass. Too much body fat puts you at increased risk for cardiovascular disease, heart attack, stroke, type 2 diabetes, and obesity-related cancers. In addition to exercise, following the Pritikin Eating Plan can help reduce your risk.  Decoding Lab Results  Clinical staff conducted group or individual video education with verbal and written material and guidebook.  Patient learns that lab test reflects one measurement whose values change over time and are influenced by many factors, including medication, stress, sleep, exercise, food, hydration, pre-existing medical conditions, and more. It is recommended to use the knowledge from this video to become more involved with  your lab results and evaluate your numbers to speak with your doctor.   Diseases of Our Time - Overview  Clinical staff conducted group or individual video education with verbal and written material and guidebook.  Patient learns that according to the CDC, 50% to 70% of chronic diseases (such as obesity, type 2 diabetes, elevated lipids, hypertension, and heart disease) are avoidable through lifestyle improvements including healthier food choices, listening to satiety cues, and increased physical activity.  Sleep Disorders Clinical staff conducted group or individual video education with verbal and written material and guidebook.  Patient learns how good quality and duration of sleep are important to overall health and well-being. Patient also learns about sleep disorders and how they impact health along with recommendations to address them, including discussing with a physician.  Nutrition  Dining Out - Part 2 Clinical staff conducted group or individual video education with verbal and written material and guidebook.  Patient learns how to plan ahead and communicate in order to maximize their dining experience in a healthy and nutritious manner. Included are recommended food choices based on the type of restaurant the patient is visiting.   Fueling a Banker conducted group or individual video education with verbal and written material and guidebook.  There is a strong connection between our food choices and our health. Diseases like obesity and type 2 diabetes are very prevalent and are in large-part due to lifestyle choices. The Pritikin Eating Plan provides plenty of food and hunger-curbing satisfaction. It is easy to follow, affordable, and helps reduce health risks.  Menu Workshop  Clinical staff conducted group or individual video education with verbal and written material and guidebook.  Patient learns that restaurant meals  can sabotage health goals because they are  often packed with calories, fat, sodium, and sugar. Recommendations include strategies to plan ahead and to communicate with the manager, chef, or server to help order a healthier meal.  Planning Your Eating Strategy  Clinical staff conducted group or individual video education with verbal and written material and guidebook.  Patient learns about the Pritikin Eating Plan and its benefit of reducing the risk of disease. The Pritikin Eating Plan does not focus on calories. Instead, it emphasizes high-quality, nutrient-rich foods. By knowing the characteristics of the foods, we choose, we can determine their calorie density and make informed decisions.  Targeting Your Nutrition Priorities  Clinical staff conducted group or individual video education with verbal and written material and guidebook.  Patient learns that lifestyle habits have a tremendous impact on disease risk and progression. This video provides eating and physical activity recommendations based on your personal health goals, such as reducing LDL cholesterol, losing weight, preventing or controlling type 2 diabetes, and reducing high blood pressure.  Vitamins and Minerals  Clinical staff conducted group or individual video education with verbal and written material and guidebook.  Patient learns different ways to obtain key vitamins and minerals, including through a recommended healthy diet. It is important to discuss all supplements you take with your doctor.   Healthy Mind-Set    Smoking Cessation  Clinical staff conducted group or individual video education with verbal and written material and guidebook.  Patient learns that cigarette smoking and tobacco addiction pose a serious health risk which affects millions of people. Stopping smoking will significantly reduce the risk of heart disease, lung disease, and many forms of cancer. Recommended strategies for quitting are covered, including working with your doctor to develop a  successful plan.  Culinary   Becoming a Set Designer conducted group or individual video education with verbal and written material and guidebook.  Patient learns that cooking at home can be healthy, cost-effective, quick, and puts them in control. Keys to cooking healthy recipes will include looking at your recipe, assessing your equipment needs, planning ahead, making it simple, choosing cost-effective seasonal ingredients, and limiting the use of added fats, salts, and sugars.  Cooking - Breakfast and Snacks  Clinical staff conducted group or individual video education with verbal and written material and guidebook.  Patient learns how important breakfast is to satiety and nutrition through the entire day. Recommendations include key foods to eat during breakfast to help stabilize blood sugar levels and to prevent overeating at meals later in the day. Planning ahead is also a key component.  Cooking - Educational Psychologist conducted group or individual video education with verbal and written material and guidebook.  Patient learns eating strategies to improve overall health, including an approach to cook more at home. Recommendations include thinking of animal protein as a side on your plate rather than center stage and focusing instead on lower calorie dense options like vegetables, fruits, whole grains, and plant-based proteins, such as beans. Making sauces in large quantities to freeze for later and leaving the skin on your vegetables are also recommended to maximize your experience.  Cooking - Healthy Salads and Dressing Clinical staff conducted group or individual video education with verbal and written material and guidebook.  Patient learns that vegetables, fruits, whole grains, and legumes are the foundations of the Pritikin Eating Plan. Recommendations include how to incorporate each of these in flavorful and healthy salads, and how to  create homemade salad  dressings. Proper handling of ingredients is also covered. Cooking - Soups and [employer] - Soups and Desserts Clinical staff conducted group or individual video education with verbal and written material and guidebook.  Patient learns that Pritikin soups and desserts make for easy, nutritious, and delicious snacks and meal components that are low in sodium, fat, sugar, and calorie density, while high in vitamins, minerals, and filling fiber. Recommendations include simple and healthy ideas for soups and desserts.   Overview     The Pritikin Solution Program Overview Clinical staff conducted group or individual video education with verbal and written material and guidebook.  Patient learns that the results of the Pritikin Program have been documented in more than 100 articles published in peer-reviewed journals, and the benefits include reducing risk factors for (and, in some cases, even reversing) high cholesterol, high blood pressure, type 2 diabetes, obesity, and more! An overview of the three key pillars of the Pritikin Program will be covered: eating well, doing regular exercise, and having a healthy mind-set.  WORKSHOPS  Exercise: Exercise Basics: Building Your Action Plan Clinical staff led group instruction and group discussion with PowerPoint presentation and patient guidebook. To enhance the learning environment the use of posters, models and videos may be added. At the conclusion of this workshop, patients will comprehend the difference between physical activity and exercise, as well as the benefits of incorporating both, into their routine. Patients will understand the FITT (Frequency, Intensity, Time, and Type) principle and how to use it to build an exercise action plan. In addition, safety concerns and other considerations for exercise and cardiac rehab will be addressed by the presenter. The purpose of this lesson is to promote a comprehensive and effective weekly exercise  routine in order to improve patients' overall level of fitness.   Managing Heart Disease: Your Path to a Healthier Heart Clinical staff led group instruction and group discussion with PowerPoint presentation and patient guidebook. To enhance the learning environment the use of posters, models and videos may be added.At the conclusion of this workshop, patients will understand the anatomy and physiology of the heart. Additionally, they will understand how Pritikin's three pillars impact the risk factors, the progression, and the management of heart disease.  The purpose of this lesson is to provide a high-level overview of the heart, heart disease, and how the Pritikin lifestyle positively impacts risk factors.  Exercise Biomechanics Clinical staff led group instruction and group discussion with PowerPoint presentation and patient guidebook. To enhance the learning environment the use of posters, models and videos may be added. Patients will learn how the structural parts of their bodies function and how these functions impact their daily activities, movement, and exercise. Patients will learn how to promote a neutral spine, learn how to manage pain, and identify ways to improve their physical movement in order to promote healthy living. The purpose of this lesson is to expose patients to common physical limitations that impact physical activity. Participants will learn practical ways to adapt and manage aches and pains, and to minimize their effect on regular exercise. Patients will learn how to maintain good posture while sitting, walking, and lifting.  Balance Training and Fall Prevention  Clinical staff led group instruction and group discussion with PowerPoint presentation and patient guidebook. To enhance the learning environment the use of posters, models and videos may be added. At the conclusion of this workshop, patients will understand the importance of their sensorimotor skills  (vision, proprioception,  and the vestibular system) in maintaining their ability to balance as they age. Patients will apply a variety of balancing exercises that are appropriate for their current level of function. Patients will understand the common causes for poor balance, possible solutions to these problems, and ways to modify their physical environment in order to minimize their fall risk. The purpose of this lesson is to teach patients about the importance of maintaining balance as they age and ways to minimize their risk of falling.  WORKSHOPS   Nutrition:  Fueling a Ship Broker led group instruction and group discussion with PowerPoint presentation and patient guidebook. To enhance the learning environment the use of posters, models and videos may be added. Patients will review the foundational principles of the Pritikin Eating Plan and understand what constitutes a serving size in each of the food groups. Patients will also learn Pritikin-friendly foods that are better choices when away from home and review make-ahead meal and snack options. Calorie density will be reviewed and applied to three nutrition priorities: weight maintenance, weight loss, and weight gain. The purpose of this lesson is to reinforce (in a group setting) the key concepts around what patients are recommended to eat and how to apply these guidelines when away from home by planning and selecting Pritikin-friendly options. Patients will understand how calorie density may be adjusted for different weight management goals.  Mindful Eating  Clinical staff led group instruction and group discussion with PowerPoint presentation and patient guidebook. To enhance the learning environment the use of posters, models and videos may be added. Patients will briefly review the concepts of the Pritikin Eating Plan and the importance of low-calorie dense foods. The concept of mindful eating will be introduced as well as the  importance of paying attention to internal hunger signals. Triggers for non-hunger eating and techniques for dealing with triggers will be explored. The purpose of this lesson is to provide patients with the opportunity to review the basic principles of the Pritikin Eating Plan, discuss the value of eating mindfully and how to measure internal cues of hunger and fullness using the Hunger Scale. Patients will also discuss reasons for non-hunger eating and learn strategies to use for controlling emotional eating.  Targeting Your Nutrition Priorities Clinical staff led group instruction and group discussion with PowerPoint presentation and patient guidebook. To enhance the learning environment the use of posters, models and videos may be added. Patients will learn how to determine their genetic susceptibility to disease by reviewing their family history. Patients will gain insight into the importance of diet as part of an overall healthy lifestyle in mitigating the impact of genetics and other environmental insults. The purpose of this lesson is to provide patients with the opportunity to assess their personal nutrition priorities by looking at their family history, their own health history and current risk factors. Patients will also be able to discuss ways of prioritizing and modifying the Pritikin Eating Plan for their highest risk areas  Menu  Clinical staff led group instruction and group discussion with PowerPoint presentation and patient guidebook. To enhance the learning environment the use of posters, models and videos may be added. Using menus brought in from [employer], or printed from [employer], patients will apply the Pritikin dining out guidelines that were presented in the [employer] video. Patients will also be able to practice these guidelines in a variety of provided scenarios. The purpose of this lesson is to provide patients with the opportunity  to practice  hands-on learning of the [employer] guidelines with actual menus and practice scenarios.  Label Reading Clinical staff led group instruction and group discussion with PowerPoint presentation and patient guidebook. To enhance the learning environment the use of posters, models and videos may be added. Patients will review and discuss the Pritikin label reading guidelines presented in Pritikin's Label Reading Educational series video. Using fool labels brought in from local grocery stores and markets, patients will apply the label reading guidelines and determine if the packaged food meet the Pritikin guidelines. The purpose of this lesson is to provide patients with the opportunity to review, discuss, and practice hands-on learning of the Pritikin Label Reading guidelines with actual packaged food labels. Cooking School  Pritikin's [employer] are designed to teach patients ways to prepare quick, simple, and affordable recipes at home. The importance of nutrition's role in chronic disease risk reduction is reflected in its emphasis in the overall Pritikin program. By learning how to prepare essential core Pritikin Eating Plan recipes, patients will increase control over what they eat; be able to customize the flavor of foods without the use of added salt, sugar, or fat; and improve the quality of the food they consume. By learning a set of core recipes which are easily assembled, quickly prepared, and affordable, patients are more likely to prepare more healthy foods at home. These workshops focus on convenient breakfasts, simple entres, side dishes, and desserts which can be prepared with minimal effort and are consistent with nutrition recommendations for cardiovascular risk reduction. Cooking [employer] are taught by a armed forces logistics/support/administrative officer (RD) who has been trained by the [employer]. The chef or RD has a clear understanding of the importance of minimizing -  if not completely eliminating - added fat, sugar, and sodium in recipes. Throughout the series of Cooking School Workshop sessions, patients will learn about healthy ingredients and efficient methods of cooking to build confidence in their capability to prepare    Cooking School weekly topics:  Adding Flavor- Sodium-Free  Fast and Healthy Breakfasts  Powerhouse Plant-Based Proteins  Satisfying Salads and Dressings  Simple Sides and Sauces  International Cuisine-Spotlight on the [employer] Zones  Delicious Desserts  Savory Soups  [employer] - Meals in a Astronomer Appetizers and Snacks  Comforting Weekend Breakfasts  One-Pot Wonders   Fast Evening Meals  Landscape Architect Your Pritikin Plate  WORKSHOPS   Healthy Mindset (Psychosocial):  Focused Goals, Sustainable Changes Clinical staff led group instruction and group discussion with PowerPoint presentation and patient guidebook. To enhance the learning environment the use of posters, models and videos may be added. Patients will be able to apply effective goal setting strategies to establish at least one personal goal, and then take consistent, meaningful action toward that goal. They will learn to identify common barriers to achieving personal goals and develop strategies to overcome them. Patients will also gain an understanding of how our mind-set can impact our ability to achieve goals and the importance of cultivating a positive and growth-oriented mind-set. The purpose of this lesson is to provide patients with a deeper understanding of how to set and achieve personal goals, as well as the tools and strategies needed to overcome common obstacles which may arise along the way.  From Head to Heart: The Power of a Healthy Outlook  Clinical staff led group instruction and group discussion with PowerPoint presentation and patient guidebook. To enhance the learning environment  the use of posters, models and videos may be  added. Patients will be able to recognize and describe the impact of emotions and mood on physical health. They will discover the importance of self-care and explore self-care practices which may work for them. Patients will also learn how to utilize the 4 C's to cultivate a healthier outlook and better manage stress and challenges. The purpose of this lesson is to demonstrate to patients how a healthy outlook is an essential part of maintaining good health, especially as they continue their cardiac rehab journey.  Healthy Sleep for a Healthy Heart Clinical staff led group instruction and group discussion with PowerPoint presentation and patient guidebook. To enhance the learning environment the use of posters, models and videos may be added. At the conclusion of this workshop, patients will be able to demonstrate knowledge of the importance of sleep to overall health, well-being, and quality of life. They will understand the symptoms of, and treatments for, common sleep disorders. Patients will also be able to identify daytime and nighttime behaviors which impact sleep, and they will be able to apply these tools to help manage sleep-related challenges. The purpose of this lesson is to provide patients with a general overview of sleep and outline the importance of quality sleep. Patients will learn about a few of the most common sleep disorders. Patients will also be introduced to the concept of "sleep hygiene," and discover ways to self-manage certain sleeping problems through simple daily behavior changes. Finally, the workshop will motivate patients by clarifying the links between quality sleep and their goals of heart-healthy living.   Recognizing and Reducing Stress Clinical staff led group instruction and group discussion with PowerPoint presentation and patient guidebook. To enhance the learning environment the use of posters, models and videos may be added. At the conclusion of this workshop, patients  will be able to understand the types of stress reactions, differentiate between acute and chronic stress, and recognize the impact that chronic stress has on their health. They will also be able to apply different coping mechanisms, such as reframing negative self-talk. Patients will have the opportunity to practice a variety of stress management techniques, such as deep abdominal breathing, progressive muscle relaxation, and/or guided imagery.  The purpose of this lesson is to educate patients on the role of stress in their lives and to provide healthy techniques for coping with it.  Learning Barriers/Preferences:  Learning Barriers/Preferences - 12/27/22 1117       Learning Barriers/Preferences   Learning Barriers Sight   glasses   Learning Preferences Audio;Computer/Internet;Group Instruction;Individual Instruction;Pictoral;Skilled Demonstration;Video;Written Material;Verbal Instruction             Education Topics:  Knowledge Questionnaire Score:  Knowledge Questionnaire Score - 12/27/22 1117       Knowledge Questionnaire Score   Pre Score 26/28             Core Components/Risk Factors/Patient Goals at Admission:  Personal Goals and Risk Factors at Admission - 12/27/22 1012       Core Components/Risk Factors/Patient Goals on Admission    Weight Management Yes;Obesity;Weight Loss    Intervention Weight Management: Provide education and appropriate resources to help participant work on and attain dietary goals.;Weight Management: Develop a combined nutrition and exercise program designed to reach desired caloric intake, while maintaining appropriate intake of nutrient and fiber, sodium and fats, and appropriate energy expenditure required for the weight goal.;Weight Management/Obesity: Establish reasonable short term and long term weight goals.;Obesity: Provide education and appropriate  resources to help participant work on and attain dietary goals.    Expected Outcomes Short  Term: Continue to assess and modify interventions until short term weight is achieved;Long Term: Adherence to nutrition and physical activity/exercise program aimed toward attainment of established weight goal;Weight Loss: Understanding of general recommendations for a balanced deficit meal plan, which promotes 1-2 lb weight loss per week and includes a negative energy balance of [phone] kcal/d;Understanding recommendations for meals to include 15-35% energy as protein, 25-35% energy from fat, 35-60% energy from carbohydrates, less than 200mg  of dietary cholesterol, 20-35 gm of total fiber daily;Understanding of distribution of calorie intake throughout the day with the consumption of 4-5 meals/snacks    Tobacco Cessation Yes    Number of packs per day 2 cigarettes    Intervention Assist the participant in steps to quit. Provide individualized education and counseling about committing to Tobacco Cessation, relapse prevention, and pharmacological support that can be provided by physician.;Education officer, environmental, assist with locating and accessing local/national Quit Smoking programs, and support quit date choice.    Expected Outcomes Short Term: Will quit all tobacco product use, adhering to prevention of relapse plan.;Long Term: Complete abstinence from all tobacco products for at least 12 months from quit date.;Short Term: Will demonstrate readiness to quit, by selecting a quit date.    Diabetes Yes    Intervention Provide education about signs/symptoms and action to take for hypo/hyperglycemia.;Provide education about proper nutrition, including hydration, and aerobic/resistive exercise prescription along with prescribed medications to achieve blood glucose in normal ranges: Fasting glucose 65-99 mg/dL    Expected Outcomes Long Term: Attainment of HbA1C < 7%.;Short Term: Participant verbalizes understanding of the signs/symptoms and immediate care of hyper/hypoglycemia, proper foot care and  importance of medication, aerobic/resistive exercise and nutrition plan for blood glucose control.    Hypertension Yes    Intervention Provide education on lifestyle modifcations including regular physical activity/exercise, weight management, moderate sodium restriction and increased consumption of fresh fruit, vegetables, and low fat dairy, alcohol moderation, and smoking cessation.;Monitor prescription use compliance.    Expected Outcomes Short Term: Continued assessment and intervention until BP is < 140/11mm HG in hypertensive participants. < 130/9mm HG in hypertensive participants with diabetes, heart failure or chronic kidney disease.;Long Term: Maintenance of blood pressure at goal levels.    Lipids Yes    Intervention Provide education and support for participant on nutrition & aerobic/resistive exercise along with prescribed medications to achieve LDL 70mg , HDL >40mg .    Expected Outcomes Short Term: Participant states understanding of desired cholesterol values and is compliant with medications prescribed. Participant is following exercise prescription and nutrition guidelines.;Long Term: Cholesterol controlled with medications as prescribed, with individualized exercise RX and with personalized nutrition plan. Value goals: LDL < 70mg , HDL > 40 mg.    Stress Yes    Intervention Refer participants experiencing significant psychosocial distress to appropriate mental health specialists for further evaluation and treatment. When possible, include family members and significant others in education/counseling sessions.;Offer individual and/or small group education and counseling on adjustment to heart disease, stress management and health-related lifestyle change. Teach and support self-help strategies.    Expected Outcomes Long Term: Emotional wellbeing is indicated by absence of clinically significant psychosocial distress or social isolation.;Short Term: Participant demonstrates changes in  health-related behavior, relaxation and other stress management skills, ability to obtain effective social support, and compliance with psychotropic medications if prescribed.             Core Components/Risk Factors/Patient Goals  Review:   Goals and Risk Factor Review     Row Name 01/13/23 0906 01/18/23 1345           Core Components/Risk Factors/Patient Goals Review   Personal Goals Review Weight Management/Obesity;Lipids;Hypertension;Stress;Tobacco Cessation;Diabetes Weight Management/Obesity;Lipids;Hypertension;Stress;Tobacco Cessation;Diabetes      Review Emanuela started cardiac rehab on 01/10/23. Anahlia did well with exercise. Vital signs were stable. Sport checked CBG's WNL. Keylee is not on diabetic medication at this time. Will monitor BP's as exertional systolic BP's in the 90's. Lynnie is off to a good start to exercise. Vital signs have been stable. Encouraged tobacco cessation. Gave phone number and hand out to [place] quit line.      Expected Outcomes Willisha will continue to participate in cardia c rehab for exercise, nutrtion and lifestyle modifications. Will discuss and encourage smoking cessation Harleyquinn will continue to participate in cardia c rehab for exercise, nutrtion and lifestyle modifications. Will discuss and encourage smoking cessation               Core Components/Risk Factors/Patient Goals at Discharge (Final Review):   Goals and Risk Factor Review - 01/18/23 1345       Core Components/Risk Factors/Patient Goals Review   Personal Goals Review Weight Management/Obesity;Lipids;Hypertension;Stress;Tobacco Cessation;Diabetes    Review Lyliana is off to a good start to exercise. Vital signs have been stable. Encouraged tobacco cessation. Gave phone number and hand out to [place] quit line.    Expected Outcomes Kenley will continue to participate in cardia c rehab for exercise, nutrtion and lifestyle modifications. Will discuss and encourage smoking cessation             ITP  Comments:  ITP Comments     Row Name 12/27/22 1011 01/13/23 0852 01/18/23 1343       ITP Comments Dr. Wilbert Bihari medical director. Introduction to [facility]ikin education/intensive cardiac rehab. Initial orientation packet reviewed with patient. 30 Day ITP Review. Kailie started caridac rehab. Milca did well with exercise. 30 Day ITP Review. Taneil is off to a good start to exercise at  cardiac rehab.              Comments: See ITP comments.Hadassah Elpidio Quan RN BSN

## 2023-01-19 ENCOUNTER — Encounter (HOSPITAL_COMMUNITY): Payer: Commercial Managed Care - HMO

## 2023-01-19 ENCOUNTER — Ambulatory Visit (HOSPITAL_COMMUNITY): Payer: Managed Care, Other (non HMO)

## 2023-01-21 ENCOUNTER — Telehealth (HOSPITAL_COMMUNITY): Payer: Self-pay

## 2023-01-21 ENCOUNTER — Encounter (HOSPITAL_COMMUNITY): Payer: Commercial Managed Care - HMO

## 2023-01-21 ENCOUNTER — Ambulatory Visit (HOSPITAL_COMMUNITY): Payer: Managed Care, Other (non HMO)

## 2023-01-21 NOTE — Telephone Encounter (Signed)
 Left VM for patient, no classes offered today after 12:30 due to impending weather

## 2023-01-24 ENCOUNTER — Ambulatory Visit (HOSPITAL_COMMUNITY): Payer: Managed Care, Other (non HMO)

## 2023-01-24 ENCOUNTER — Encounter (HOSPITAL_COMMUNITY): Payer: Commercial Managed Care - HMO

## 2023-01-26 ENCOUNTER — Ambulatory Visit (HOSPITAL_COMMUNITY): Payer: Managed Care, Other (non HMO)

## 2023-01-26 ENCOUNTER — Encounter (HOSPITAL_COMMUNITY)
Admission: RE | Admit: 2023-01-26 | Discharge: 2023-01-26 | Disposition: A | Discharge status: Home / Self Care | Payer: Commercial Managed Care - HMO | Source: Ambulatory Visit | Attending: Cardiovascular Disease | Admitting: Cardiovascular Disease

## 2023-01-26 DIAGNOSIS — Z955 Presence of coronary angioplasty implant and graft: Secondary | ICD-10-CM

## 2023-01-26 NOTE — Progress Notes (Signed)
 Cynthia Patton presented to cardiac rehab today.  Alerted staff that she felt "bad".  Felt this way since fasting from food and medication on Monday for lab work that turned out she did not need to fast.  Complains of feeling sluggish although able to go to work - states she paces herself.  CBG running well within normal limits.  A1C 6.2.  Asked to have her bp checked Right arm 166/82 changed to larger cuff 160/80.  Much higher than her usual despite taking her bp medications.  Denies any headache. Pt states when I miss my medication it takes a while to get back in my system. Advised to hold off on exercise for today. Continue to rest when able and take her medications.  To return on Friday. Lettie Ray, BSN Cardiac and Emergency planning/management officer

## 2023-01-27 ENCOUNTER — Telehealth: Payer: Self-pay | Admitting: Cardiovascular Disease

## 2023-01-27 MED ORDER — CLOPIDOGREL BISULFATE 75 MG PO TABS: ORAL_TABLET | ORAL | 3 refills | Status: DC

## 2023-01-27 NOTE — Telephone Encounter (Signed)
OK to switch to clopidogrel. Would give 300 mg day 1 then 75 mg daily. thx

## 2023-01-27 NOTE — Telephone Encounter (Signed)
Discussed with Callie as she saw patient most recently, reasonable to swap to Plavix (s/p DES 11/2022). Will route to Dr. Excell Seltzer to inquire if Plavix load recommended or go right to 75mg  dose.   Alver Sorrow, NP

## 2023-01-27 NOTE — Telephone Encounter (Signed)
Spoke with pt, with the savings card for commercially insured patients, the patients responsibility is still going to be $423.09 until she meets her deductible. The patient reports not being able to afford this. Aware will need to send to Dr Duke Salvia to see if effient would be appropriate. The patient has been out of medication for 3 days.

## 2023-01-27 NOTE — Telephone Encounter (Signed)
Pt c/o medication issue:  1. Name of Medication: ticagrelor (BRILINTA) 90 MG TABS tablet   2. How are you currently taking this medication (dosage and times per day)? Take 1 tablet (90 mg total) by mouth 2 (two) times daily.   3. Are you having a reaction (difficulty breathing--STAT)? No 4. What is your medication issue? She went to get her medication and it will now cost her $1,200 to get it. Patient would like to know if she can get it at a cheaper cost or have another alternative. Please advise.  Pt c/o BP issue: STAT if pt c/o blurred vision, one-sided weakness or slurred speech  1. What are your last 5 BP readings? 169/64  2. Are you having any other symptoms (ex. Dizziness, headache, blurred vision, passed out)? Slight Headache and pain in her left arm starting today  3. What is your BP issue? Patient is calling because her BP is running high due to not having her medication for 3 days. Please advise,

## 2023-01-27 NOTE — Telephone Encounter (Signed)
Left detailed message for patient, she will take 4 of the 75 mg tablets for the first dose and then 75 mg once daily. New script sent to the pharmacy

## 2023-01-28 ENCOUNTER — Ambulatory Visit (HOSPITAL_COMMUNITY): Payer: Managed Care, Other (non HMO)

## 2023-01-28 ENCOUNTER — Encounter (HOSPITAL_COMMUNITY): Payer: Commercial Managed Care - HMO

## 2023-01-31 ENCOUNTER — Encounter (HOSPITAL_COMMUNITY): Payer: Commercial Managed Care - HMO

## 2023-01-31 ENCOUNTER — Ambulatory Visit (HOSPITAL_COMMUNITY): Payer: Managed Care, Other (non HMO)

## 2023-02-02 ENCOUNTER — Ambulatory Visit (HOSPITAL_COMMUNITY): Payer: Managed Care, Other (non HMO)

## 2023-02-02 ENCOUNTER — Encounter (HOSPITAL_COMMUNITY): Payer: Commercial Managed Care - HMO

## 2023-02-04 ENCOUNTER — Ambulatory Visit (HOSPITAL_COMMUNITY): Payer: Managed Care, Other (non HMO)

## 2023-02-04 ENCOUNTER — Encounter (HOSPITAL_COMMUNITY): Payer: Commercial Managed Care - HMO

## 2023-02-07 ENCOUNTER — Ambulatory Visit (HOSPITAL_COMMUNITY): Payer: Managed Care, Other (non HMO)

## 2023-02-07 ENCOUNTER — Encounter (HOSPITAL_COMMUNITY): Admission: RE | Admit: 2023-02-07 | Payer: Medicaid Other | Source: Ambulatory Visit

## 2023-02-08 ENCOUNTER — Telehealth (HOSPITAL_COMMUNITY): Payer: Self-pay | Admitting: *Deleted

## 2023-02-08 NOTE — Telephone Encounter (Signed)
Left message to call cardiac rehab.Thayer Headings RN BSN

## 2023-02-09 ENCOUNTER — Encounter (HOSPITAL_COMMUNITY): Admission: RE | Admit: 2023-02-09 | Payer: Medicaid Other | Source: Ambulatory Visit

## 2023-02-09 ENCOUNTER — Ambulatory Visit (HOSPITAL_COMMUNITY): Payer: Managed Care, Other (non HMO)

## 2023-02-11 ENCOUNTER — Telehealth (HOSPITAL_COMMUNITY): Payer: Self-pay | Admitting: *Deleted

## 2023-02-11 ENCOUNTER — Encounter (HOSPITAL_COMMUNITY): Admission: RE | Admit: 2023-02-11 | Payer: Commercial Managed Care - HMO | Source: Ambulatory Visit

## 2023-02-11 ENCOUNTER — Ambulatory Visit (HOSPITAL_COMMUNITY): Payer: Managed Care, Other (non HMO)

## 2023-02-11 NOTE — Telephone Encounter (Signed)
Cynthia Patton left voice mail message on 02/10/23 at 4:24pm letting us know she will be back on Monday to resume her cardiac rehab.

## 2023-02-11 NOTE — Progress Notes (Signed)
Cardiac Individual Treatment Plan  Patient Details  Name: BRILEY BUMGARNER MRN: [id number] Date of Birth: 26-Dec-1960 Referring Provider:   Flowsheet Row CARDIAC REHAB PHASE II ORIENTATION from 12/27/2022 in [facility], Vascular, & Lung Health  Referring Provider Tonny Bollman, MD       Initial Encounter Date:  Flowsheet Row CARDIAC REHAB PHASE II ORIENTATION from 12/27/2022 in [facility] for Heart, Vascular, & Lung Health  Date 12/27/22       Visit Diagnosis: 11/13/22 DES RCA  Patient's Home Medications on Admission:  Current Outpatient Medications:    aspirin 81 MG chewable tablet, Chew 1 tablet (81 mg total) by mouth daily., Disp: 90 tablet, Rfl: 3   atorvastatin (LIPITOR) 80 MG tablet, Take 1 tablet (80 mg total) by mouth daily., Disp: 30 tablet, Rfl: 2   blood glucose meter kit and supplies KIT, Dispense based on patient and insurance preference. Use up to four times daily as directed. (FOR ICD-9 250.00, 250.01)., Disp: 1 each, Rfl: 0   carvedilol (COREG) 6.25 MG tablet, Take 1 tablet (6.25 mg total) by mouth 2 (two) times daily., Disp: 180 tablet, Rfl: 3   clopidogrel (PLAVIX) 75 MG tablet, Take 4 tablets for the first dose and then 1 tablet once daily, Disp: 94 tablet, Rfl: 3   fluticasone (FLONASE) 50 MCG/ACT nasal spray, Place 2 sprays into both nostrils daily., Disp: 16 g, Rfl: 6   levothyroxine (SYNTHROID) 175 MCG tablet, Take 175 mcg by mouth daily before breakfast., Disp: , Rfl:    losartan (COZAAR) 100 MG tablet, Take 1 tablet (100 mg total) by mouth daily., Disp: 90 tablet, Rfl: 3   mometasone (NASONEX) 50 MCG/ACT nasal spray, Place 2 sprays into the nose daily., Disp: 17 g, Rfl: 1   nitroGLYCERIN (NITROSTAT) 0.4 MG SL tablet, Place 1 tablet (0.4 mg total) under the tongue every 5 (five) minutes as needed for chest pain., Disp: 25 tablet, Rfl: 2   ONETOUCH VERIO test strip, USE UP TO 4 TIMES DAILY AS DIRECTED,  Disp: , Rfl:   Past Medical History: Past Medical History:  Diagnosis Date   Acute low back pain 03/25/2020   Anxiety    Cancer (HCC) 2000?   thyroid cancer   Depression    Diabetes mellitus without complication (HCC)    Hyperlipidemia    Hypertension    Thyroid disease     Tobacco Use: Social History   Tobacco Use  Smoking Status Every Day   Current packs/day: 0.50   Types: Cigarettes  Smokeless Tobacco Never    Labs: Review Flowsheet  More data exists      Latest Ref Rng & Units 07/27/2019 11/01/2019 02/08/2020 02/09/2021 11/13/2022  Labs for ITP Cardiac and Pulmonary Rehab  Cholestrol 0 - 200 mg/dL 409  - 811  914  782   LDL (calc) 0 - 99 mg/dL 956  - 213  086  578   HDL-C >40 mg/dL 37  - 44  47  41   Trlycerides <150 mg/dL 469  - 97  629  528   Hemoglobin A1c 4.8 - 5.6 % - 6.3  6.2  6.2  6.2  6.2  6.8  6.8  6.8  6.8  6.8     Details       Multiple values from one day are sorted in reverse-chronological order         Capillary Blood Glucose: Lab Results  Component Value Date  GLUCAP 122 (H) 01/14/2023   GLUCAP 111 (H) 01/10/2023   GLUCAP 90 01/10/2023   GLUCAP 126 (H) 11/13/2022   GLUCAP 248 (H) 07/08/2017     Exercise Target Goals: Exercise Program Goal: Individual exercise prescription set using results from initial 6 min walk test and THRR while considering  patient's activity barriers and safety.   Exercise Prescription Goal: Initial exercise prescription builds to 30-45 minutes a day of aerobic activity, 2-3 days per week.  Home exercise guidelines will be given to patient during program as part of exercise prescription that the participant will acknowledge.  Activity Barriers & Risk Stratification:  Activity Barriers & Cardiac Risk Stratification - 12/27/22 1111       Activity Barriers & Cardiac Risk Stratification   Activity Barriers Shortness of Breath;Joint Problems    Cardiac Risk Stratification High   <5 METs on             6 Minute Walk:  6 Minute Walk     Row Name 12/27/22 1256         6 Minute Walk   Phase Initial     Distance 1390 feet     Walk Time 6 minutes     # of Rest Breaks 2  very brief standing rest     MPH 2.64     METS 3     RPE 9     Perceived Dyspnea  0     VO2 Peak 10.51     Symptoms Yes (comment)     Comments 1/10 L calf pain- resolved with rest     Resting HR 70 bpm     Resting BP 146/84     Resting Oxygen Saturation  98 %     Exercise Oxygen Saturation  during 6 min walk 98 %     Max Ex. HR 93 bpm     Max Ex. BP 176/84     2 Minute Post BP 136/82              Oxygen Initial Assessment:   Oxygen Re-Evaluation:   Oxygen Discharge (Final Oxygen Re-Evaluation):   Initial Exercise Prescription:  Initial Exercise Prescription - 12/27/22 1200       Date of Initial Exercise RX and Referring Provider   Date 12/27/22    Referring Provider Tonny Bollman, MD    Expected Discharge Date 03/23/23      NuStep   Level 1    SPM 65    Minutes 15    METs 2      Recumbant Elliptical   Level 1    RPM 55    Watts 70    Minutes 15    METs 2.5      Prescription Details   Frequency (times per week) 3    Duration Progress to 30 minutes of continuous aerobic without signs/symptoms of physical distress      Intensity   THRR 40-80% of Max Heartrate 63-126    Ratings of Perceived Exertion 11-13    Perceived Dyspnea 0-4      Progression   Progression Continue progressive overload as per policy without signs/symptoms or physical distress.      Resistance Training   Training Prescription Yes    Weight 2    Reps 10-15             Perform Capillary Blood Glucose checks as needed.  Exercise Prescription Changes:   Exercise Prescription Changes     Row Name  01/10/23 1600             Response to Exercise   Blood Pressure (Admit) 122/80       Blood Pressure (Exercise) 152/84       Blood Pressure (Exit) 146/84       Heart Rate (Admit) 69 bpm        Heart Rate (Exercise) 111 bpm       Heart Rate (Exit) 81 bpm       Rating of Perceived Exertion (Exercise) 10       Perceived Dyspnea (Exercise) 0       Symptoms None       Comments Pt first day in CRP2       Duration Continue with 30 min of aerobic exercise without signs/symptoms of physical distress.       Intensity THRR unchanged         Progression   Progression Continue to progress workloads to maintain intensity without signs/symptoms of physical distress.       Average METs 2.3         Resistance Training   Training Prescription Yes       Weight 2       Reps 10-15         Arm Ergometer   Level 1       Watts 16       Minutes 15       METs 1.6         Recumbant Elliptical   Level 1       RPM 54       Watts 78       Minutes 15       METs 3                Exercise Comments:   Exercise Comments     Row Name 01/10/23 1701 01/24/23 1401         Exercise Comments Pt first day in CRP2 program. Pt tolerated exercise well with an avg MET level of 2.3 with no s/sx. Pt did not like Nustep equipment, so switched to arm ergometer without issue. Pt is learning her THRR, RPE scale, and ExRx. Pt absent today. Will review education when she is able to attend on a more regular basis. Pt absent since 01/14/23               Exercise Goals and Review:   Exercise Goals     Row Name 12/27/22 1012             Exercise Goals   Increase Physical Activity Yes       Intervention Provide advice, education, support and counseling about physical activity/exercise needs.;Develop an individualized exercise prescription for aerobic and resistive training based on initial evaluation findings, risk stratification, comorbidities and participant's personal goals.       Expected Outcomes Short Term: Attend rehab on a regular basis to increase amount of physical activity.;Long Term: Exercising regularly at least 3-5 days a week.;Long Term: Add in home exercise to make exercise part of  routine and to increase amount of physical activity.       Increase Strength and Stamina Yes       Intervention Provide advice, education, support and counseling about physical activity/exercise needs.;Develop an individualized exercise prescription for aerobic and resistive training based on initial evaluation findings, risk stratification, comorbidities and participant's personal goals.       Expected Outcomes Short Term: Increase workloads from initial  exercise prescription for resistance, speed, and METs.;Short Term: Perform resistance training exercises routinely during rehab and add in resistance training at home;Long Term: Improve cardiorespiratory fitness, muscular endurance and strength as measured by increased METs and functional capacity ( )       Able to understand and use rate of perceived exertion (RPE) scale Yes       Intervention Provide education and explanation on how to use RPE scale       Expected Outcomes Short Term: Able to use RPE daily in rehab to express subjective intensity level;Long Term:  Able to use RPE to guide intensity level when exercising independently       Knowledge and understanding of Target Heart Rate Range (THRR) Yes       Intervention Provide education and explanation of THRR including how the numbers were predicted and where they are located for reference       Expected Outcomes Short Term: Able to state/look up THRR;Short Term: Able to use daily as guideline for intensity in rehab;Long Term: Able to use THRR to govern intensity when exercising independently       Understanding of Exercise Prescription Yes       Intervention Provide education, explanation, and written materials on patient's individual exercise prescription       Expected Outcomes Short Term: Able to explain program exercise prescription;Long Term: Able to explain home exercise prescription to exercise independently                Exercise Goals Re-Evaluation :  Exercise Goals  Re-Evaluation     Row Name 01/10/23 1651             Exercise Goal Re-Evaluation   Exercise Goals Review Increase Physical Activity;Understanding of Exercise Prescription;Increase Strength and Stamina;Knowledge and understanding of Target Heart Rate Range (THRR);Able to understand and use rate of perceived exertion (RPE) scale       Comments Pt first day in CRP2 program. Pt tolerated exercise well with an avg MET level of 2.3 with no s/sx. Pt did not like Nustep equipment, so switched to arm ergometer without issue. Pt is learning her THRR, RPE scale, and ExRx.       Expected Outcomes Will continue to progress workloads as tolerated without s/sx.                Discharge Exercise Prescription (Final Exercise Prescription Changes):  Exercise Prescription Changes - 01/10/23 1600       Response to Exercise   Blood Pressure (Admit) 122/80    Blood Pressure (Exercise) 152/84    Blood Pressure (Exit) 146/84    Heart Rate (Admit) 69 bpm    Heart Rate (Exercise) 111 bpm    Heart Rate (Exit) 81 bpm    Rating of Perceived Exertion (Exercise) 10    Perceived Dyspnea (Exercise) 0    Symptoms None    Comments Pt first day in CRP2    Duration Continue with 30 min of aerobic exercise without signs/symptoms of physical distress.    Intensity THRR unchanged      Progression   Progression Continue to progress workloads to maintain intensity without signs/symptoms of physical distress.    Average METs 2.3      Resistance Training   Training Prescription Yes    Weight 2    Reps 10-15      Arm Ergometer   Level 1    Watts 16    Minutes 15    METs 1.6  Recumbant Elliptical   Level 1    RPM 54    Watts 78    Minutes 15    METs 3             Nutrition:  Target Goals: Understanding of nutrition guidelines, daily intake of sodium 1500mg , cholesterol 200mg , calories 30% from fat and 7% or less from saturated fats, daily to have 5 or more servings of fruits and  vegetables.  Biometrics:  Pre Biometrics - 12/27/22 1052       Pre Biometrics   Waist Circumference 46 inches    Hip Circumference 49 inches    Waist to Hip Ratio 0.94 %    Triceps Skinfold 37 mm    % Body Fat 50.5 %    Grip Strength 29 kg    Flexibility 12.5 in    Single Leg Stand 30 seconds              Nutrition Therapy Plan and Nutrition Goals:   Nutrition Assessments:  MEDIFICTS Score Key: >=70 Need to make dietary changes  40-70 Heart Healthy Diet <= 40 Therapeutic Level Cholesterol Diet   Flowsheet Row CARDIAC REHAB PHASE II EXERCISE from 01/10/2023 in [facility] [address] for Heart, Vascular, & Lung Health  Picture Your Plate Total Score on Admission 53      Picture Your Plate Scores: <81 Unhealthy dietary pattern with much room for improvement. 41-50 Dietary pattern unlikely to meet recommendations for good health and room for improvement. 51-60 More healthful dietary pattern, with some room for improvement.  >60 Healthy dietary pattern, although there may be some specific behaviors that could be improved.    Nutrition Goals Re-Evaluation:   Nutrition Goals Re-Evaluation:   Nutrition Goals Discharge (Final Nutrition Goals Re-Evaluation):   Psychosocial: Target Goals: Acknowledge presence or absence of significant depression and/or stress, maximize coping skills, provide positive support system. Participant is able to verbalize types and ability to use techniques and skills needed for reducing stress and depression.  Initial Review & Psychosocial Screening:  Initial Psych Review & Screening - 12/27/22 1111       Initial Review   Current issues with Current Depression;Current Anxiety/Panic;Current Stress Concerns    Source of Stress Concerns Family;Occupation    Comments Angelisa shared that she currently has high levels of depression/anxiety/stress.  Mostly related to work and familial strains and church. She used to take medication  but recently stopped, wants to possibly resume. She is interested in seeing a counselor, will discuss with RN. Shaquasha does not have a PCP and has been looking for a new one, encouraged to discuss medications with PCP once established. Nevena stated she is not having any thoughts of harming herself or others, this has been an ongoing feeling since 2020.      Family Dynamics   Good Support System? Yes   Has her brother, but stated he is unreliable     Barriers   Psychosocial barriers to participate in program The patient should benefit from training in stress management and relaxation.      Screening Interventions   Interventions Provide feedback about the scores to participant;To provide support and resources with identified psychosocial needs;Encouraged to exercise    Expected Outcomes Long Term goal: The participant improves quality of Life and PHQ9 Scores as seen by post scores and/or verbalization of changes;Short Term goal: Identification and review with participant of any Quality of Life or Depression concerns found by scoring the questionnaire.;Long Term Goal: Stressors or  current issues are controlled or eliminated.;Short Term goal: Utilizing psychosocial counselor, staff and physician to assist with identification of specific Stressors or current issues interfering with healing process. Setting desired goal for each stressor or current issue identified.             Quality of Life Scores:  Quality of Life - 12/27/22 1258       Quality of Life   Select Quality of Life      Quality of Life Scores   Health/Function Pre 17.54 %    Socioeconomic Pre 23.9 %    Psych/Spiritual Pre 20.36 %    Family Pre 15 %    GLOBAL Pre 19.2 %            Scores of 19 and below usually indicate a poorer quality of life in these areas.  A difference of  2-3 points is a clinically meaningful difference.  A difference of 2-3 points in the total score of the Quality of Life Index has been associated  with significant improvement in overall quality of life, self-image, physical symptoms, and general health in studies assessing change in quality of life.  PHQ-9: Review Flowsheet  More data exists      12/27/2022 09/10/2021 06/30/2021 04/09/2021 02/09/2021  Depression screen PHQ 2/9  Decreased Interest 0 1 0 0 1  Down, Depressed, Hopeless 1 1 1  0 1  PHQ - 2 Score 1 2 1  0 2  Altered sleeping 0 1 1 0 0  Tired, decreased energy 2 1 1  0 0  Change in appetite 0 - 1 - 1  Feeling bad or failure about yourself  0 1 0 0 0  Trouble concentrating 0 0 0 0 0  Moving slowly or fidgety/restless 0 0 0 0 0  Suicidal thoughts 0 0 0 0 0  PHQ-9 Score 3 5 4  0 3  Difficult doing work/chores Somewhat difficult - Not difficult at all - -   Interpretation of Total Score  Total Score Depression Severity:  1-4 = Minimal depression, 5-9 = Mild depression, 10-14 = Moderate depression, 15-19 = Moderately severe depression, 20-27 = Severe depression   Psychosocial Evaluation and Intervention:   Psychosocial Re-Evaluation:  Psychosocial Re-Evaluation     Row Name 01/13/23 0857 02/11/23 1143           Psychosocial Re-Evaluation   Current issues with Current Anxiety/Panic;Current Stress Concerns;Current Depression Current Anxiety/Panic;Current Stress Concerns;Current Depression      Comments Marilu did not voice any increased concerns or stressors on her first day of exercise. Will review quality of life and PHQ2-9 in the upcoming week Will review quality of life and PHQ2-9 when [name] returns to exercise at cardiac rehab.      Expected Outcomes Lanyiah will have controlled or decreased depression, anxiety/ panic upon compleition of cardiac rehab Dreamer will have controlled or decreased depression, anxiety/ panic upon compleition of cardiac rehab      Interventions Stress management education;Encouraged to attend Cardiac Rehabilitation for the exercise;Relaxation education Stress management education;Encouraged to attend  Cardiac Rehabilitation for the exercise;Relaxation education      Continue Psychosocial Services  Follow up required by staff Follow up required by staff        Initial Review   Source of Stress Concerns Chronic Illness;Family;Occupation Chronic Illness;Family;Occupation      Comments Will continue to monitor and offer support as needed. Will continue to monitor and offer support as needed.  Psychosocial Discharge (Final Psychosocial Re-Evaluation):  Psychosocial Re-Evaluation - 02/11/23 1143       Psychosocial Re-Evaluation   Current issues with Current Anxiety/Panic;Current Stress Concerns;Current Depression    Comments Will review quality of life and PHQ2-9 when Ronetta returns to exercise at cardiac rehab.    Expected Outcomes Dedee will have controlled or decreased depression, anxiety/ panic upon compleition of cardiac rehab    Interventions Stress management education;Encouraged to attend Cardiac Rehabilitation for the exercise;Relaxation education    Continue Psychosocial Services  Follow up required by staff      Initial Review   Source of Stress Concerns Chronic Illness;Family;Occupation    Comments Will continue to monitor and offer support as needed.             Vocational Rehabilitation: Provide vocational rehab assistance to qualifying candidates.   Vocational Rehab Evaluation & Intervention:  Vocational Rehab - 12/27/22 1117       Initial Vocational Rehab Evaluation & Intervention   Assessment shows need for Vocational Rehabilitation No   Jnaya is working            Education: Education Goals: Education classes will be provided on a weekly basis, covering required topics. Participant will state understanding/return demonstration of topics presented.    Education     Row Name 01/07/23 1400     Education   Cardiac Education Topics Pritikin   Glass blower/designer Nutrition   Nutrition Workshop  Targeting Your Nutrition Priorities   Instruction Review Code 1- Verbalizes Understanding   Class Start Time 1357   Class Stop Time 1436   Class Time Calculation (min) 39 min    Row Name 01/10/23 1600     Education   Cardiac Education Topics Pritikin   Geographical information systems officer Psychosocial   Psychosocial Workshop Focused Goals, Sustainable Changes   Instruction Review Code 1- Verbalizes Understanding   Class Start Time 1400   Class Stop Time 1440   Class Time Calculation (min) 40 min    Row Name 01/14/23 1400     Education   Cardiac Education Topics Pritikin   Licensed conveyancer Nutrition   Nutrition Dining Out - Part 1   Instruction Review Code 1- Verbalizes Understanding   Class Start Time 1358   Class Stop Time 1442   Class Time Calculation (min) 44 min            Core Videos: Exercise    Move It!  Clinical staff conducted group or individual video education with verbal and written material and guidebook.  Patient learns the recommended Pritikin exercise program. Exercise with the goal of living a long, healthy life. Some of the health benefits of exercise include controlled diabetes, healthier blood pressure levels, improved cholesterol levels, improved heart and lung capacity, improved sleep, and better body composition. Everyone should speak with their doctor before starting or changing an exercise routine.  Biomechanical Limitations Clinical staff conducted group or individual video education with verbal and written material and guidebook.  Patient learns how biomechanical limitations can impact exercise and how we can mitigate and possibly overcome limitations to have an impactful and balanced exercise routine.  Body Composition Clinical staff conducted group or individual video education with verbal and written material and guidebook.  Patient learns that body  composition (ratio of muscle mass to fat mass) is a key component to assessing overall fitness, rather than body weight alone. Increased fat mass, especially visceral belly fat, can put [place] at increased risk for metabolic syndrome, type 2 diabetes, heart disease, and even death. It is recommended to combine diet and exercise (cardiovascular and resistance training) to improve your body composition. Seek guidance from your physician and exercise physiologist before implementing an exercise routine.  Exercise Action Plan Clinical staff conducted group or individual video education with verbal and written material and guidebook.  Patient learns the recommended strategies to achieve and enjoy long-term exercise adherence, including variety, self-motivation, self-efficacy, and positive decision making. Benefits of exercise include fitness, good health, weight management, more energy, better sleep, less stress, and overall well-being.  Medical   Heart Disease Risk Reduction Clinical staff conducted group or individual video education with verbal and written material and guidebook.  Patient learns our heart is our most vital organ as it circulates oxygen, nutrients, white blood cells, and hormones throughout the entire body, and carries waste away. Data supports a plant-based eating plan like the Pritikin Program for its effectiveness in slowing progression of and reversing heart disease. The video provides a number of recommendations to address heart disease.   Metabolic Syndrome and Belly Fat  Clinical staff conducted group or individual video education with verbal and written material and guidebook.  Patient learns what metabolic syndrome is, how it leads to heart disease, and how one can reverse it and keep it from coming back. You have metabolic syndrome if you have 3 of the following 5 criteria: abdominal obesity, high blood pressure, high triglycerides, low HDL cholesterol, and high blood  sugar.  Hypertension and Heart Disease Clinical staff conducted group or individual video education with verbal and written material and guidebook.  Patient learns that high blood pressure, or hypertension, is very common in the [place]. Hypertension is largely due to excessive salt intake, but other important risk factors include being overweight, physical inactivity, drinking too much alcohol, smoking, and not eating enough potassium from fruits and vegetables. High blood pressure is a leading risk factor for heart attack, stroke, congestive heart failure, dementia, kidney failure, and premature death. Long-term effects of excessive salt intake include stiffening of the arteries and thickening of heart muscle and organ damage. Recommendations include ways to reduce hypertension and the risk of heart disease.  Diseases of Our Time - Focusing on Diabetes Clinical staff conducted group or individual video education with verbal and written material and guidebook.  Patient learns why the best way to stop diseases of our time is prevention, through food and other lifestyle changes. Medicine (such as prescription pills and surgeries) is often only a Band-Aid on the problem, not a long-term solution. Most common diseases of our time include obesity, type 2 diabetes, hypertension, heart disease, and cancer. The Pritikin Program is recommended and has been proven to help reduce, reverse, and/or prevent the damaging effects of metabolic syndrome.  Nutrition   Overview of the Pritikin Eating Plan  Clinical staff conducted group or individual video education with verbal and written material and guidebook.  Patient learns about the Pritikin Eating Plan for disease risk reduction. The Pritikin Eating Plan emphasizes a wide variety of unrefined, minimally-processed carbohydrates, like fruits, vegetables, whole grains, and legumes. Go, Caution, and Stop food choices are explained. Plant-based and lean animal  proteins are emphasized. Rationale provided for low sodium intake for blood pressure control, low added sugars for  blood sugar stabilization, and low added fats and oils for coronary artery disease risk reduction and weight management.  Calorie Density  Clinical staff conducted group or individual video education with verbal and written material and guidebook.  Patient learns about calorie density and how it impacts the Pritikin Eating Plan. Knowing the characteristics of the food you choose will help you decide whether those foods will lead to weight gain or weight loss, and whether you want to consume more or less of them. Weight loss is usually a side effect of the Pritikin Eating Plan because of its focus on low calorie-dense foods.  Label Reading  Clinical staff conducted group or individual video education with verbal and written material and guidebook.  Patient learns about the Pritikin recommended label reading guidelines and corresponding recommendations regarding calorie density, added sugars, sodium content, and whole grains.  Dining Out - Part 1  Clinical staff conducted group or individual video education with verbal and written material and guidebook.  Patient learns that restaurant meals can be sabotaging because they can be so high in calories, fat, sodium, and/or sugar. Patient learns recommended strategies on how to positively address this and avoid unhealthy pitfalls.  Facts on Fats  Clinical staff conducted group or individual video education with verbal and written material and guidebook.  Patient learns that lifestyle modifications can be just as effective, if not more so, as many medications for lowering your risk of heart disease. A Pritikin lifestyle can help to reduce your risk of inflammation and atherosclerosis (cholesterol build-up, or plaque, in the artery walls). Lifestyle interventions such as dietary choices and physical activity address the cause of atherosclerosis.  A review of the types of fats and their impact on blood cholesterol levels, along with dietary recommendations to reduce fat intake is also included.  Nutrition Action Plan  Clinical staff conducted group or individual video education with verbal and written material and guidebook.  Patient learns how to incorporate Pritikin recommendations into their lifestyle. Recommendations include planning and keeping personal health goals in mind as an important part of their success.  Healthy Mind-Set    Healthy Minds, Bodies, Hearts  Clinical staff conducted group or individual video education with verbal and written material and guidebook.  Patient learns how to identify when they are stressed. Video will discuss the impact of that stress, as well as the many benefits of stress management. Patient will also be introduced to stress management techniques. The way we think, act, and feel has an impact on our hearts.  How Our Thoughts Can Heal Our Hearts  Clinical staff conducted group or individual video education with verbal and written material and guidebook.  Patient learns that negative thoughts can cause depression and anxiety. This can result in negative lifestyle behavior and serious health problems. Cognitive behavioral therapy is an effective method to help control our thoughts in order to change and improve our emotional outlook.  Additional Videos:  Exercise    Improving Performance  Clinical staff conducted group or individual video education with verbal and written material and guidebook.  Patient learns to use a non-linear approach by alternating intensity levels and lengths of time spent exercising to help burn more calories and lose more body fat. Cardiovascular exercise helps improve heart health, metabolism, hormonal balance, blood sugar control, and recovery from fatigue. Resistance training improves strength, endurance, balance, coordination, reaction time, metabolism, and muscle mass.  Flexibility exercise improves circulation, posture, and balance. Seek guidance from your physician and exercise physiologist before  implementing an exercise routine and learn your capabilities and proper form for all exercise.  Introduction to Yoga  Clinical staff conducted group or individual video education with verbal and written material and guidebook.  Patient learns about yoga, a discipline of the coming together of mind, breath, and body. The benefits of yoga include improved flexibility, improved range of motion, better posture and core strength, increased lung function, weight loss, and positive self-image. Yoga's heart health benefits include lowered blood pressure, healthier heart rate, decreased cholesterol and triglyceride levels, improved immune function, and reduced stress. Seek guidance from your physician and exercise physiologist before implementing an exercise routine and learn your capabilities and proper form for all exercise.  Medical   Aging: Enhancing Your Quality of Life  Clinical staff conducted group or individual video education with verbal and written material and guidebook.  Patient learns key strategies and recommendations to stay in good physical health and enhance quality of life, such as prevention strategies, having an advocate, securing a Health Care Proxy and Power of Attorney, and keeping a list of medications and system for tracking them. It also discusses how to avoid risk for bone loss.  Biology of Weight Control  Clinical staff conducted group or individual video education with verbal and written material and guidebook.  Patient learns that weight gain occurs because we consume more calories than we burn (eating more, moving less). Even if your body weight is normal, you may have higher ratios of fat compared to muscle mass. Too much body fat puts you at increased risk for cardiovascular disease, heart attack, stroke, type 2 diabetes, and obesity-related  cancers. In addition to exercise, following the Pritikin Eating Plan can help reduce your risk.  Decoding Lab Results  Clinical staff conducted group or individual video education with verbal and written material and guidebook.  Patient learns that lab test reflects one measurement whose values change over time and are influenced by many factors, including medication, stress, sleep, exercise, food, hydration, pre-existing medical conditions, and more. It is recommended to use the knowledge from this video to become more involved with your lab results and evaluate your numbers to speak with your doctor.   Diseases of Our Time - Overview  Clinical staff conducted group or individual video education with verbal and written material and guidebook.  Patient learns that according to the CDC, 50% to 70% of chronic diseases (such as obesity, type 2 diabetes, elevated lipids, hypertension, and heart disease) are avoidable through lifestyle improvements including healthier food choices, listening to satiety cues, and increased physical activity.  Sleep Disorders Clinical staff conducted group or individual video education with verbal and written material and guidebook.  Patient learns how good quality and duration of sleep are important to overall health and well-being. Patient also learns about sleep disorders and how they impact health along with recommendations to address them, including discussing with a physician.  Nutrition  Dining Out - Part 2 Clinical staff conducted group or individual video education with verbal and written material and guidebook.  Patient learns how to plan ahead and communicate in order to maximize their dining experience in a healthy and nutritious manner. Included are recommended food choices based on the type of restaurant the patient is visiting.   Fueling a Banker conducted group or individual video education with verbal and written material and  guidebook.  There is a strong connection between our food choices and our health. Diseases like obesity and type 2 diabetes  are very prevalent and are in large-part due to lifestyle choices. The Pritikin Eating Plan provides plenty of food and hunger-curbing satisfaction. It is easy to follow, affordable, and helps reduce health risks.  Menu Workshop  Clinical staff conducted group or individual video education with verbal and written material and guidebook.  Patient learns that restaurant meals can sabotage health goals because they are often packed with calories, fat, sodium, and sugar. Recommendations include strategies to plan ahead and to communicate with the manager, chef, or server to help order a healthier meal.  Planning Your Eating Strategy  Clinical staff conducted group or individual video education with verbal and written material and guidebook.  Patient learns about the Pritikin Eating Plan and its benefit of reducing the risk of disease. The Pritikin Eating Plan does not focus on calories. Instead, it emphasizes high-quality, nutrient-rich foods. By knowing the characteristics of the foods, we choose, we can determine their calorie density and make informed decisions.  Targeting Your Nutrition Priorities  Clinical staff conducted group or individual video education with verbal and written material and guidebook.  Patient learns that lifestyle habits have a tremendous impact on disease risk and progression. This video provides eating and physical activity recommendations based on your personal health goals, such as reducing LDL cholesterol, losing weight, preventing or controlling type 2 diabetes, and reducing high blood pressure.  Vitamins and Minerals  Clinical staff conducted group or individual video education with verbal and written material and guidebook.  Patient learns different ways to obtain key vitamins and minerals, including through a recommended healthy diet. It is  important to discuss all supplements you take with your doctor.   Healthy Mind-Set    Smoking Cessation  Clinical staff conducted group or individual video education with verbal and written material and guidebook.  Patient learns that cigarette smoking and tobacco addiction pose a serious health risk which affects millions of people. Stopping smoking will significantly reduce the risk of heart disease, lung disease, and many forms of cancer. Recommended strategies for quitting are covered, including working with your doctor to develop a successful plan.  Culinary   Becoming a Set designer conducted group or individual video education with verbal and written material and guidebook.  Patient learns that cooking at home can be healthy, cost-effective, quick, and puts them in control. Keys to cooking healthy recipes will include looking at your recipe, assessing your equipment needs, planning ahead, making it simple, choosing cost-effective seasonal ingredients, and limiting the use of added fats, salts, and sugars.  Cooking - Breakfast and Snacks  Clinical staff conducted group or individual video education with verbal and written material and guidebook.  Patient learns how important breakfast is to satiety and nutrition through the entire day. Recommendations include key foods to eat during breakfast to help stabilize blood sugar levels and to prevent overeating at meals later in the day. Planning ahead is also a key component.  Cooking - Educational psychologist conducted group or individual video education with verbal and written material and guidebook.  Patient learns eating strategies to improve overall health, including an approach to cook more at home. Recommendations include thinking of animal protein as a side on your plate rather than center stage and focusing instead on lower calorie dense options like vegetables, fruits, whole grains, and plant-based proteins,  such as beans. Making sauces in large quantities to freeze for later and leaving the skin on your vegetables are also recommended to maximize  your experience.  Cooking - Healthy Salads and Dressing Clinical staff conducted group or individual video education with verbal and written material and guidebook.  Patient learns that vegetables, fruits, whole grains, and legumes are the foundations of the Pritikin Eating Plan. Recommendations include how to incorporate each of these in flavorful and healthy salads, and how to create homemade salad dressings. Proper handling of ingredients is also covered. Cooking - Soups and [employer] - Soups and Desserts Clinical staff conducted group or individual video education with verbal and written material and guidebook.  Patient learns that Pritikin soups and desserts make for easy, nutritious, and delicious snacks and meal components that are low in sodium, fat, sugar, and calorie density, while high in vitamins, minerals, and filling fiber. Recommendations include simple and healthy ideas for soups and desserts.   Overview     The Pritikin Solution Program Overview Clinical staff conducted group or individual video education with verbal and written material and guidebook.  Patient learns that the results of the Pritikin Program have been documented in more than 100 articles published in peer-reviewed journals, and the benefits include reducing risk factors for (and, in some cases, even reversing) high cholesterol, high blood pressure, type 2 diabetes, obesity, and more! An overview of the three key pillars of the Pritikin Program will be covered: eating well, doing regular exercise, and having a healthy mind-set.  WORKSHOPS  Exercise: Exercise Basics: Building Your Action Plan Clinical staff led group instruction and group discussion with PowerPoint presentation and patient guidebook. To enhance the learning environment the use of posters, models and  videos may be added. At the conclusion of this workshop, patients will comprehend the difference between physical activity and exercise, as well as the benefits of incorporating both, into their routine. Patients will understand the FITT (Frequency, Intensity, Time, and Type) principle and how to use it to build an exercise action plan. In addition, safety concerns and other considerations for exercise and cardiac rehab will be addressed by the presenter. The purpose of this lesson is to promote a comprehensive and effective weekly exercise routine in order to improve patients' overall level of fitness.   Managing Heart Disease: Your Path to a Healthier Heart Clinical staff led group instruction and group discussion with PowerPoint presentation and patient guidebook. To enhance the learning environment the use of posters, models and videos may be added.At the conclusion of this workshop, patients will understand the anatomy and physiology of the heart. Additionally, they will understand how Pritikin's three pillars impact the risk factors, the progression, and the management of heart disease.  The purpose of this lesson is to provide a high-level overview of the heart, heart disease, and how the Pritikin lifestyle positively impacts risk factors.  Exercise Biomechanics Clinical staff led group instruction and group discussion with PowerPoint presentation and patient guidebook. To enhance the learning environment the use of posters, models and videos may be added. Patients will learn how the structural parts of their bodies function and how these functions impact their daily activities, movement, and exercise. Patients will learn how to promote a neutral spine, learn how to manage pain, and identify ways to improve their physical movement in order to promote healthy living. The purpose of this lesson is to expose patients to common physical limitations that impact physical activity. Participants  will learn practical ways to adapt and manage aches and pains, and to minimize their effect on regular exercise. Patients will learn how to maintain good posture while  sitting, walking, and lifting.  Balance Training and Fall Prevention  Clinical staff led group instruction and group discussion with PowerPoint presentation and patient guidebook. To enhance the learning environment the use of posters, models and videos may be added. At the conclusion of this workshop, patients will understand the importance of their sensorimotor skills (vision, proprioception, and the vestibular system) in maintaining their ability to balance as they age. Patients will apply a variety of balancing exercises that are appropriate for their current level of function. Patients will understand the common causes for poor balance, possible solutions to these problems, and ways to modify their physical environment in order to minimize their fall risk. The purpose of this lesson is to teach patients about the importance of maintaining balance as they age and ways to minimize their risk of falling.  WORKSHOPS   Nutrition:  Fueling a Ship broker led group instruction and group discussion with PowerPoint presentation and patient guidebook. To enhance the learning environment the use of posters, models and videos may be added. Patients will review the foundational principles of the Pritikin Eating Plan and understand what constitutes a serving size in each of the food groups. Patients will also learn Pritikin-friendly foods that are better choices when away from home and review make-ahead meal and snack options. Calorie density will be reviewed and applied to three nutrition priorities: weight maintenance, weight loss, and weight gain. The purpose of this lesson is to reinforce (in a group setting) the key concepts around what patients are recommended to eat and how to apply these guidelines when away from home by  planning and selecting Pritikin-friendly options. Patients will understand how calorie density may be adjusted for different weight management goals.  Mindful Eating  Clinical staff led group instruction and group discussion with PowerPoint presentation and patient guidebook. To enhance the learning environment the use of posters, models and videos may be added. Patients will briefly review the concepts of the Pritikin Eating Plan and the importance of low-calorie dense foods. The concept of mindful eating will be introduced as well as the importance of paying attention to internal hunger signals. Triggers for non-hunger eating and techniques for dealing with triggers will be explored. The purpose of this lesson is to provide patients with the opportunity to review the basic principles of the Pritikin Eating Plan, discuss the value of eating mindfully and how to measure internal cues of hunger and fullness using the Hunger Scale. Patients will also discuss reasons for non-hunger eating and learn strategies to use for controlling emotional eating.  Targeting Your Nutrition Priorities Clinical staff led group instruction and group discussion with PowerPoint presentation and patient guidebook. To enhance the learning environment the use of posters, models and videos may be added. Patients will learn how to determine their genetic susceptibility to disease by reviewing their family history. Patients will gain insight into the importance of diet as part of an overall healthy lifestyle in mitigating the impact of genetics and other environmental insults. The purpose of this lesson is to provide patients with the opportunity to assess their personal nutrition priorities by looking at their family history, their own health history and current risk factors. Patients will also be able to discuss ways of prioritizing and modifying the Pritikin Eating Plan for their highest risk areas  Menu  Clinical staff led group  instruction and group discussion with PowerPoint presentation and patient guidebook. To enhance the learning environment the use of posters, models and videos may be  added. Using menus brought in from [employer], or printed from [employer], patients will apply the Pritikin dining out guidelines that were presented in the [employer] video. Patients will also be able to practice these guidelines in a variety of provided scenarios. The purpose of this lesson is to provide patients with the opportunity to practice hands-on learning of the Pritikin Dining Out guidelines with actual menus and practice scenarios.  Label Reading Clinical staff led group instruction and group discussion with PowerPoint presentation and patient guidebook. To enhance the learning environment the use of posters, models and videos may be added. Patients will review and discuss the Pritikin label reading guidelines presented in Pritikin's Label Reading Educational series video. Using fool labels brought in from local grocery stores and markets, patients will apply the label reading guidelines and determine if the packaged food meet the Pritikin guidelines. The purpose of this lesson is to provide patients with the opportunity to review, discuss, and practice hands-on learning of the Pritikin Label Reading guidelines with actual packaged food labels. Cooking School  Pritikin's [employer] are designed to teach patients ways to prepare quick, simple, and affordable recipes at home. The importance of nutrition's role in chronic disease risk reduction is reflected in its emphasis in the overall Pritikin program. By learning how to prepare essential core Pritikin Eating Plan recipes, patients will increase control over what they eat; be able to customize the flavor of foods without the use of added salt, sugar, or fat; and improve the quality of the food they consume. By learning a set of core recipes  which are easily assembled, quickly prepared, and affordable, patients are more likely to prepare more healthy foods at home. These workshops focus on convenient breakfasts, simple entres, side dishes, and desserts which can be prepared with minimal effort and are consistent with nutrition recommendations for cardiovascular risk reduction. Cooking [employer] are taught by a Armed forces logistics/support/administrative officer (RD) who has been trained by the [employer]. The chef or RD has a clear understanding of the importance of minimizing - if not completely eliminating - added fat, sugar, and sodium in recipes. Throughout the series of Cooking School Workshop sessions, patients will learn about healthy ingredients and efficient methods of cooking to build confidence in their capability to prepare    Cooking School weekly topics:  Adding Flavor- Sodium-Free  Fast and Healthy Breakfasts  Powerhouse Plant-Based Proteins  Satisfying Salads and Dressings  Simple Sides and Sauces  International Cuisine-Spotlight on the [employer] Zones  Delicious Desserts  Savory Soups  [employer] - Meals in a Astronomer Appetizers and Snacks  Comforting Weekend Breakfasts  One-Pot Wonders   Fast Evening Meals  Landscape architect Your Pritikin Plate  WORKSHOPS   Healthy Mindset (Psychosocial):  Focused Goals, Sustainable Changes Clinical staff led group instruction and group discussion with PowerPoint presentation and patient guidebook. To enhance the learning environment the use of posters, models and videos may be added. Patients will be able to apply effective goal setting strategies to establish at least one personal goal, and then take consistent, meaningful action toward that goal. They will learn to identify common barriers to achieving personal goals and develop strategies to overcome them. Patients will also gain an understanding of how our mind-set can impact our ability to achieve  goals and the importance of cultivating a positive and growth-oriented mind-set. The purpose of this lesson is to provide patients with a deeper  understanding of how to set and achieve personal goals, as well as the tools and strategies needed to overcome common obstacles which may arise along the way.  From Head to Heart: The Power of a Healthy Outlook  Clinical staff led group instruction and group discussion with PowerPoint presentation and patient guidebook. To enhance the learning environment the use of posters, models and videos may be added. Patients will be able to recognize and describe the impact of emotions and mood on physical health. They will discover the importance of self-care and explore self-care practices which may work for them. Patients will also learn how to utilize the 4 C's to cultivate a healthier outlook and better manage stress and challenges. The purpose of this lesson is to demonstrate to patients how a healthy outlook is an essential part of maintaining good health, especially as they continue their cardiac rehab journey.  Healthy Sleep for a Healthy Heart Clinical staff led group instruction and group discussion with PowerPoint presentation and patient guidebook. To enhance the learning environment the use of posters, models and videos may be added. At the conclusion of this workshop, patients will be able to demonstrate knowledge of the importance of sleep to overall health, well-being, and quality of life. They will understand the symptoms of, and treatments for, common sleep disorders. Patients will also be able to identify daytime and nighttime behaviors which impact sleep, and they will be able to apply these tools to help manage sleep-related challenges. The purpose of this lesson is to provide patients with a general overview of sleep and outline the importance of quality sleep. Patients will learn about a few of the most common sleep disorders. Patients will also be  introduced to the concept of "sleep hygiene," and discover ways to self-manage certain sleeping problems through simple daily behavior changes. Finally, the workshop will motivate patients by clarifying the links between quality sleep and their goals of heart-healthy living.   Recognizing and Reducing Stress Clinical staff led group instruction and group discussion with PowerPoint presentation and patient guidebook. To enhance the learning environment the use of posters, models and videos may be added. At the conclusion of this workshop, patients will be able to understand the types of stress reactions, differentiate between acute and chronic stress, and recognize the impact that chronic stress has on their health. They will also be able to apply different coping mechanisms, such as reframing negative self-talk. Patients will have the opportunity to practice a variety of stress management techniques, such as deep abdominal breathing, progressive muscle relaxation, and/or guided imagery.  The purpose of this lesson is to educate patients on the role of stress in their lives and to provide healthy techniques for coping with it.  Learning Barriers/Preferences:  Learning Barriers/Preferences - 12/27/22 1117       Learning Barriers/Preferences   Learning Barriers Sight   glasses   Learning Preferences Audio;Computer/Internet;Group Instruction;Individual Instruction;Pictoral;Skilled Demonstration;Video;Written Material;Verbal Instruction             Education Topics:  Knowledge Questionnaire Score:  Knowledge Questionnaire Score - 12/27/22 1117       Knowledge Questionnaire Score   Pre Score 26/28             Core Components/Risk Factors/Patient Goals at Admission:  Personal Goals and Risk Factors at Admission - 12/27/22 1012       Core Components/Risk Factors/Patient Goals on Admission    Weight Management Yes;Obesity;Weight Loss    Intervention Weight Management: Provide education  and appropriate  resources to help participant work on and attain dietary goals.;Weight Management: Develop a combined nutrition and exercise program designed to reach desired caloric intake, while maintaining appropriate intake of nutrient and fiber, sodium and fats, and appropriate energy expenditure required for the weight goal.;Weight Management/Obesity: Establish reasonable short term and long term weight goals.;Obesity: Provide education and appropriate resources to help participant work on and attain dietary goals.    Expected Outcomes Short Term: Continue to assess and modify interventions until short term weight is achieved;Long Term: Adherence to nutrition and physical activity/exercise program aimed toward attainment of established weight goal;Weight Loss: Understanding of general recommendations for a balanced deficit meal plan, which promotes 1-2 lb weight loss per week and includes a negative energy balance of [phone] kcal/d;Understanding recommendations for meals to include 15-35% energy as protein, 25-35% energy from fat, 35-60% energy from carbohydrates, less than 200mg  of dietary cholesterol, 20-35 gm of total fiber daily;Understanding of distribution of calorie intake throughout the day with the consumption of 4-5 meals/snacks    Tobacco Cessation Yes    Number of packs per day 2 cigarettes    Intervention Assist the participant in steps to quit. Provide individualized education and counseling about committing to Tobacco Cessation, relapse prevention, and pharmacological support that can be provided by physician.;Education officer, environmental, assist with locating and accessing local/national Quit Smoking programs, and support quit date choice.    Expected Outcomes Short Term: Will quit all tobacco product use, adhering to prevention of relapse plan.;Long Term: Complete abstinence from all tobacco products for at least 12 months from quit date.;Short Term: Will demonstrate readiness to quit,  by selecting a quit date.    Diabetes Yes    Intervention Provide education about signs/symptoms and action to take for hypo/hyperglycemia.;Provide education about proper nutrition, including hydration, and aerobic/resistive exercise prescription along with prescribed medications to achieve blood glucose in normal ranges: Fasting glucose 65-99 mg/dL    Expected Outcomes Long Term: Attainment of HbA1C < 7%.;Short Term: Participant verbalizes understanding of the signs/symptoms and immediate care of hyper/hypoglycemia, proper foot care and importance of medication, aerobic/resistive exercise and nutrition plan for blood glucose control.    Hypertension Yes    Intervention Provide education on lifestyle modifcations including regular physical activity/exercise, weight management, moderate sodium restriction and increased consumption of fresh fruit, vegetables, and low fat dairy, alcohol moderation, and smoking cessation.;Monitor prescription use compliance.    Expected Outcomes Short Term: Continued assessment and intervention until BP is < 140/65mm HG in hypertensive participants. < 130/26mm HG in hypertensive participants with diabetes, heart failure or chronic kidney disease.;Long Term: Maintenance of blood pressure at goal levels.    Lipids Yes    Intervention Provide education and support for participant on nutrition & aerobic/resistive exercise along with prescribed medications to achieve LDL 70mg , HDL >40mg .    Expected Outcomes Short Term: Participant states understanding of desired cholesterol values and is compliant with medications prescribed. Participant is following exercise prescription and nutrition guidelines.;Long Term: Cholesterol controlled with medications as prescribed, with individualized exercise RX and with personalized nutrition plan. Value goals: LDL < 70mg , HDL > 40 mg.    Stress Yes    Intervention Refer participants experiencing significant psychosocial distress to appropriate  mental health specialists for further evaluation and treatment. When possible, include family members and significant others in education/counseling sessions.;Offer individual and/or small group education and counseling on adjustment to heart disease, stress management and health-related lifestyle change. Teach and support self-help strategies.    Expected Outcomes Long  Term: Emotional wellbeing is indicated by absence of clinically significant psychosocial distress or social isolation.;Short Term: Participant demonstrates changes in health-related behavior, relaxation and other stress management skills, ability to obtain effective social support, and compliance with psychotropic medications if prescribed.             Core Components/Risk Factors/Patient Goals Review:   Goals and Risk Factor Review     Row Name 01/13/23 0906 01/18/23 1345 02/11/23 1145         Core Components/Risk Factors/Patient Goals Review   Personal Goals Review Weight Management/Obesity;Lipids;Hypertension;Stress;Tobacco Cessation;Diabetes Weight Management/Obesity;Lipids;Hypertension;Stress;Tobacco Cessation;Diabetes Weight Management/Obesity;Lipids;Hypertension;Stress;Tobacco Cessation;Diabetes     Review Lavone started cardiac rehab on 01/10/23. Robertine did well with exercise. Vital signs were stable. Sport checked CBG's WNL. Tayler is not on diabetic medication at this time. Will monitor BP's as exertional systolic BP's in the 90's. Cordia is off to a good start to exercise. Vital signs have been stable. Encouraged tobacco cessation. Gave phone number and hand out to [place] quit line. Chase plans to return to exercise on 02/13/33. Sarha's last day of exercise was on 01/14/23.     Expected Outcomes Bryanah will continue to participate in cardia c rehab for exercise, nutrtion and lifestyle modifications. Will discuss and encourage smoking cessation Getsemani will continue to participate in cardia c rehab for exercise, nutrtion and lifestyle  modifications. Will discuss and encourage smoking cessation Payden will continue to participate in cardia c rehab for exercise, nutrtion and lifestyle modifications. Will discuss and encourage smoking cessation              Core Components/Risk Factors/Patient Goals at Discharge (Final Review):   Goals and Risk Factor Review - 02/11/23 1145       Core Components/Risk Factors/Patient Goals Review   Personal Goals Review Weight Management/Obesity;Lipids;Hypertension;Stress;Tobacco Cessation;Diabetes    Review Purvi plans to return to exercise on 02/13/33. Hawa's last day of exercise was on 01/14/23.    Expected Outcomes Akeila will continue to participate in cardia c rehab for exercise, nutrtion and lifestyle modifications. Will discuss and encourage smoking cessation             ITP Comments:  ITP Comments     Row Name 12/27/22 1011 01/13/23 0852 01/18/23 1343 02/11/23 1141     ITP Comments Dr. Armanda Magic medical director. Introduction to pritikin education/intensive cardiac rehab. Initial orientation packet reviewed with patient. 30 Day ITP Review. Lakyn started caridac rehab. Yolande did well with exercise. 30 Day ITP Review. Cire is off to a good start to exercise at  cardiac rehab. 30 Day ITP Review. Janeisha has not exercised since 01/14/23. Samanvitha plans to return to exercise on 02/14/23.             Comments: See ITP comments.Thayer Headings RN BSN

## 2023-02-11 NOTE — Progress Notes (Signed)
 Cardiology Office Note:    Date:  02/21/2023   ID:  Cynthia Patton, DOB 07-13-1960, MRN [id number]  PCP:  Jerrlyn Morel, NP  Cardiologist:  Arnoldo Lapping, MD     Referring MD: Jerrlyn Morel, NP   Chief Complaint: follow-up of CAD  History of Present Illness:    Cynthia Patton is a 63 y.o. female with a history of CAD with inferior STEMI in 11/2022 s/p DES to RCA, hypertension, hyperlipidemia,  diet-controlled diabetes, thyroid  cancer s/p thyroidectomy now on Synthroid , and tobacco abuse who is followed by Dr. Arlester Ladd and presents today for follow-up of CAD.  Patient was first seen by Cardiology during admission in 11/2022 for an inferior STEMI.  Emergent cardiac catheterization showed 100% stenosis of RCA and otherwise only mild non-obstructive disease of left main, LAD, and LCX. LVEDP was elevated at 24 mmHg. She underwent successful PCI with DES to RCA lesion. Echo showed LVEF of 60-65% with mild LVH and grade 1 diastolic dysfunction. She was started on DAPT with Aspirin  and Brilinta  as well as a beta-blocker and statin.   She was last seen by me for follow-up on 11/23/2022 at which time she was doing well. She reported some mild shortness of breath when walking fast but otherwise not dyspnea and no recurrent chest pain. Her main complaint at that visit was fatigue but this was not new. She was suspected to have sleep apnea and a home Itmar sleep study was ordered. BP medications were also adjusted.   Patient presents today for follow-up. Patient denies any chest pain. She does report a brief episode of tingling in her left arm last Wednesday when she was very stressed. This went away when she was able to calm down. No shortness of breath. She has had some trouble breathing over the last week due to URI symptoms; however, no issues prior to this. No orthopnea or PND. She reports some lower extremity edema last week but it is better now. She denies any palpitations (outside of periods of  stress), lightheadedness, dizziness, or syncope. She has had some constipation this last week and attributes this to the Plavix . She is wants to come off of this.  Patient has had a lot of issues with her BP over the last month. She states this started when she ran out of her Brilinta  and was switched to Plavix  which she does not feel like she tolerates well.   Of note, patient is very frustrated with our office due to poor communication. Please my phone note from 02/17/2023 for more information.   When I spoke with the patient on 02/17/2023, she also describe some changes in her mood. She stated her mood could change very quickly and noted increased episodes of anger and irritability.  She requested a referral to Psychology.   EKGs/Labs/Other Studies Reviewed:    The following studies were reviewed:  Left Cardiac Catheterization 11/13/2022:   Mid RCA lesion is 100% stenosed.   Prox LAD to Mid LAD lesion is 30% stenosed.   Mid LM to Dist LM lesion is 25% stenosed.   Mid Cx lesion is 40% stenosed.   A drug-eluting stent was successfully placed using a STENT SYNERGY XD 3.0X32.   Post intervention, there is a 0% residual stenosis.   LV end diastolic pressure is moderately elevated.   Recommend uninterrupted dual antiplatelet therapy with Aspirin  81mg  daily and Ticagrelor  90mg  twice daily for a minimum of 12 months (ACS-Class I recommendation).   1.  Acute inferior MI secondary to mid RCA occlusion, treated successfully with primary PCI using a 3.0 x 32 mm Synergy DES.  Baseline 100% stenosis, TIMI 0 flow.  Post-PCI 0% residual stenosis and TIMI-3 flow. 2.  Mild nonobstructive plaquing of the left main, LAD, and left circumflex, without any high-grade stenoses in the left coronary distribution 3.  Moderately elevated LVEDP 24 mmHg   Recommendations:  2D echocardiogram for assessment of LV function Aggrastat  discontinued at completion of procedure DAPT with aspirin  and ticagrelor  for 12 months  without interruption Aggressive risk modification with high intensity statin drug, goal LDL less than 55 Consider fast-track discharge tomorrow if criteria met   Diagnostic Dominance: Right  Intervention          _____________   Echocardiogram 11/14/2022: Impressions: 1. Left ventricular ejection fraction, by estimation, is 60 to 65%. The  left ventricle has normal function. The left ventricle has no regional  wall motion abnormalities. There is mild concentric left ventricular  hypertrophy. Left ventricular diastolic  parameters are consistent with Grade I diastolic dysfunction (impaired  relaxation). Elevated left atrial pressure.   2. Right ventricular systolic function is normal. The right ventricular  size is normal. Tricuspid regurgitation signal is inadequate for assessing  PA pressure.   3. The mitral valve is normal in structure. No evidence of mitral valve  regurgitation. No evidence of mitral stenosis.   4. The aortic valve is tricuspid. Aortic valve regurgitation is not  visualized. No aortic stenosis is present.   5. The inferior vena cava is normal in size with greater than 50%  respiratory variability, suggesting right atrial pressure of 3 mmHg.   Comparison(s): No prior Echocardiogram.   EKG:  EKG not ordered today.   Recent Labs: 11/13/2022: ALT 11 11/14/2022: Hemoglobin 12.1; Platelets 309; TSH 1.305 11/23/2022: BUN 17; Creatinine, Ser 1.19; Potassium 4.0; Sodium 142  Recent Lipid Panel    Component Value Date/Time   CHOL 175 11/13/2022 1934   CHOL 179 02/09/2021 1139   TRIG 141 11/13/2022 1934   HDL 41 11/13/2022 1934   HDL 47 02/09/2021 1139   CHOLHDL 4.3 11/13/2022 1934   VLDL 28 11/13/2022 1934   LDLCALC 106 (H) 11/13/2022 1934   LDLCALC 110 (H) 02/09/2021 1139    Physical Exam:    Vital Signs: BP (!) 192/82   Pulse 77   Ht 5\' 3"  (1.6 m)   Wt 223 lb 6.4 oz (101.3 kg)   SpO2 98%   BMI 39.57 kg/m     Wt Readings from Last 3 Encounters:   02/21/23 223 lb 6.4 oz (101.3 kg)  02/17/23 221 lb 9 oz (100.5 kg)  12/27/22 221 lb 9 oz (100.5 kg)     General: 63 y.o. African-American female in no acute distress. HEENT: Normocephalic and atraumatic. Sclera clear.  Neck: Supple. No carotid bruits. No JVD. Heart: RRR. Distinct S1 and S2. No murmurs, gallops, or rubs.  Lungs: No increased work of breathing. Clear to ausculation bilaterally. No wheezes, rhonchi, or rales.  Extremities: No lower extremity edema.  Skin: Warm and dry. Neuro: No focal deficits. Psych: Normal affect. Responds appropriately.  Assessment:    1. Coronary artery disease involving native coronary artery of native heart without angina pectoris   2. History of ST elevation myocardial infarction (STEMI)   3. Primary hypertension   4. Hyperlipidemia, unspecified hyperlipidemia type   5. Type 2 diabetes mellitus with complication, without long-term current use of insulin (HCC)   6. Snoring  7. Anxiety   8. Mood changes     Plan:    CAD  Patient was admitted  in 11/2022 for inferior STEMI s/p DES to RCA.  - No recurrent chest pain.  - Currently on DAPT with Aspirin  and Plavix . However, she thinks she is having constipation with the Plavix . She also attributes her elevated BP due to this. She wants to come off Plavix . She has previously had cost issues with Brilinta . Will switch to Prasugrel  (Effient ) which is now generic. Discussed with Dr. Copper - will load patient with 30mg  on day 1 and then she can start maintenance dose of 10mg  daily on day 2. - Continue beta-blocker and statin.    Hypertension  BP elevated. Initially 162/84 and then 192/82 on my personal recheck at the end of visit. - Current medications: Coreg  6.125mg  twice daily, Losartan  100mg  daily, and Amlodipine  5mg  daily. Amlodipine  was sent in last week but she has not received call from the Pharmacy that it is available for pick up so has not started this yet.  - Discussed different options  including starting Amlodipine  has discussed vs switching to a more potent ARB. She would like to try switching to a more potent ARB first. Therefore, will stop Losartan  and switch to Olmesartan  40mg  daily. Will also increase Coreg  to 12.5mg  twice daily. Advised patient not to pick up and start Amlodipine  yet. - Will check CMET today and then repeat a BMET in 1 week after switching to Olmesartan . - Advised patient to keep a BP/HR log for 2 weeks.  - Will have patient follow-up with a APP in 1 month.   Hyperlipidemia Lipid panel in 11/2022 during admission for MI: Total Cholesterol 175, Triglycerides 141, HDL 41, LDL 106. LDL goal <70.  - She was started on Lipitor 80mg  daily during that admission. Continue.  - Will repeat lipid panel and CMET today.   Type 2 Diabetes Mellitus Hemoglobin A1c 6.8 in 11/2022.  - She used to be on Semaglutide but states this was stopped because her hemoglobin A1c improved.  - Management per PCP.     Snoring Suspect Sleep Apnea Patient reported loud snoring and described apneic episodes as well as daytime somnolence and fatigue at last visit in 11/2022. STOP BANG score is 6 suggesting she is at high risk for moderate to severe OSA. Itmar home sleep study was ordered at last visit  but she states she was never called about this.  - Will reorder Itmar today.  Anxiety Mood Changes Patient reports some anxiety and mood changes since her MI. She told me on the phone last week that her mood can change very quickly and notes increased episodes of anger and irritability. She requested a referral to Psychology. My initial plan was to refer her to Dr. Juline Ohara with [facility]. Unfortunately, he is no longer there. We did reach out to his old practice though and they stated they do not require a referral. Provided their name office name/ number to patient but patient ultimately declined.  Of note, following the patient's appointment I was notified by one of our  sleep study coordinators that patient was upset with our staff (including myself) over a political conversation that she reportedly overheard in the hall while she was waiting on paper work in the room. I am unaware of any conversation of this nature, nor did I participate in any conversation with staff of a political nature. She initially left without her paperwork or lab work but  them came back. However, she did not want LPN to review instructions with her.  Disposition: Follow up in 1 month.    Signed, Casimer Clear, PA-C  02/21/2023 1:11 PM    [facility]

## 2023-02-14 ENCOUNTER — Telehealth: Payer: Self-pay | Admitting: Cardiovascular Disease

## 2023-02-14 ENCOUNTER — Ambulatory Visit (HOSPITAL_COMMUNITY): Payer: Managed Care, Other (non HMO)

## 2023-02-14 ENCOUNTER — Encounter (HOSPITAL_COMMUNITY)
Admission: RE | Admit: 2023-02-14 | Discharge: 2023-02-14 | Disposition: A | Discharge status: Home / Self Care | Payer: Medicaid Other | Source: Ambulatory Visit | Attending: Cardiovascular Disease | Admitting: Cardiovascular Disease

## 2023-02-14 DIAGNOSIS — Z955 Presence of coronary angioplasty implant and graft: Secondary | ICD-10-CM | POA: Insufficient documentation

## 2023-02-14 DIAGNOSIS — I10 Essential (primary) hypertension: Secondary | ICD-10-CM

## 2023-02-14 NOTE — Progress Notes (Signed)
Daily Session Note  Patient Details  Name: Cynthia Patton MRN: [id number] Date of Birth: [birth year] Referring Provider:   Flowsheet Row CARDIAC REHAB PHASE II ORIENTATION from 12/27/2022 in [facility]  Referring Provider Cynthia Bollman, MD       Encounter Date: 02/14/2023  Check In:  Session Check In - 02/14/23 1602       Check-In   Supervising physician immediately available to respond to emergencies [facility] MD immediately available    Physician(s) Cynthia Fabian, NP    Location [facility]    Staff Present Cynthia Lighter, RN, Cynthia Redwood, MS, ACSM-CEP, CCRP, Exercise Physiologist;Cynthia Peggye Pitt, MS, ACSM-CEP, Exercise Physiologist;Cynthia Patton BS, ACSM-CEP, Exercise Physiologist;Cynthia Hale Bogus, MS, Exercise Physiologist    Virtual Visit No    Medication changes reported     No    Fall or balance concerns reported    No    Tobacco Cessation No Change    Warm-up and Cool-down Performed as group-led instruction   Pt discharged after BP check on track   Resistance Training Performed No    VAD Patient? No    PAD/SET Patient? No      Pain Assessment   Currently in Pain? No/denies    Pain Score 0-No pain    Multiple Pain Sites No             Capillary Blood Glucose: No results found for this or any previous visit (from the past 24 hours).    Social History   Tobacco Use  Smoking Status Every Day   Current packs/day: 0.50   Types: Cigarettes  Smokeless Tobacco Never    Goals Met:  BP elevated today. Cynthia Patton walked on the track today exercise stopped due to elevated BP.  Goals Unmet:  BP  Comments: Cynthia Patton returned to exercise at cardiac rehab today. 164/70 heart rate 69. Repeat blood pressure 142/70 after rest. Medication's reviewed. Cynthia Patton says she is taking her medications as prescribed. Cynthia Patton proceeded to walk on the track. BP noted at 182/60 heart rate 111. Recheck BP 164/60 then 148/76  heart rate 78. Cynthia Patton Cynthia Patton office called and notified about today's BP elevations. Will hold exercise until Cynthia Patton sees [name] PA on 02/21/23. Patient states understanding. Will forward today's BP reading  to Cynthia Patton Cynthia Patton for review. Cynthia Patton says she has been taking her BP's at home and have been getting elevated readings.Will continue to monitor the patient throughout  the program.    Cynthia Patton. Armanda Patton is Medical Director for Cardiac Rehab at [facility].

## 2023-02-14 NOTE — Telephone Encounter (Signed)
Pt c/o BP issue: STAT if pt c/o blurred vision, one-sided weakness or slurred speech  1. What are your last 5 BP readings? 162/70,142/70, 182/60 111, 164/70, 148/76  2. Are you having any other symptoms (ex. Dizziness, headache, blurred vision, passed out)? no  3. What is your BP issue? Cynthia Patton from [facility] said these where her BP today at Rehab. She has told her not to exercise until seeing [place] on 01/21/23 Please call pt.

## 2023-02-15 ENCOUNTER — Telehealth: Payer: Self-pay | Admitting: Cardiovascular Disease

## 2023-02-15 LAB — GLUCOSE, CAPILLARY: Glucose-Capillary: 91 mg/dL (ref 70–99)

## 2023-02-15 NOTE — Progress Notes (Signed)
Cardiac Individual Treatment Plan  Patient Details  Name: Cynthia Patton MRN: [id number] Date of Birth: 07/01/1960 Referring Provider:   Flowsheet Row CARDIAC REHAB PHASE II ORIENTATION from 12/27/2022 in [facility] for Heart, Vascular, & Lung Health  Referring Provider Tonny Bollman, MD       Initial Encounter Date:  Flowsheet Row CARDIAC REHAB PHASE II ORIENTATION from 12/27/2022 in [facility] for Heart, Vascular, & Lung Health  Date 12/27/22       Visit Diagnosis: 11/13/22 DES RCA  Patient's Home Medications on Admission:  Current Outpatient Medications:    aspirin 81 MG chewable tablet, Chew 1 tablet (81 mg total) by mouth daily., Disp: 90 tablet, Rfl: 3   atorvastatin (LIPITOR) 80 MG tablet, Take 1 tablet (80 mg total) by mouth daily., Disp: 30 tablet, Rfl: 2   blood glucose meter kit and supplies KIT, Dispense based on patient and insurance preference. Use up to four times daily as directed. (FOR ICD-9 250.00, 250.01)., Disp: 1 each, Rfl: 0   carvedilol (COREG) 6.25 MG tablet, Take 1 tablet (6.25 mg total) by mouth 2 (two) times daily., Disp: 180 tablet, Rfl: 3   clopidogrel (PLAVIX) 75 MG tablet, Take 4 tablets for the first dose and then 1 tablet once daily, Disp: 94 tablet, Rfl: 3   fluticasone (FLONASE) 50 MCG/ACT nasal spray, Place 2 sprays into both nostrils daily., Disp: 16 g, Rfl: 6   levothyroxine (SYNTHROID) 175 MCG tablet, Take 175 mcg by mouth daily before breakfast., Disp: , Rfl:    losartan (COZAAR) 100 MG tablet, Take 1 tablet (100 mg total) by mouth daily., Disp: 90 tablet, Rfl: 3   mometasone (NASONEX) 50 MCG/ACT nasal spray, Place 2 sprays into the nose daily., Disp: 17 g, Rfl: 1   nitroGLYCERIN (NITROSTAT) 0.4 MG SL tablet, Place 1 tablet (0.4 mg total) under the tongue every 5 (five) minutes as needed for chest pain., Disp: 25 tablet, Rfl: 2   ONETOUCH VERIO test strip, USE UP TO 4 TIMES DAILY AS DIRECTED,  Disp: , Rfl:   Past Medical History: Past Medical History:  Diagnosis Date   Acute low back pain 03/25/2020   Anxiety    Cancer (HCC) 2000?   thyroid cancer   Depression    Diabetes mellitus without complication (HCC)    Hyperlipidemia    Hypertension    Thyroid disease     Tobacco Use: Social History   Tobacco Use  Smoking Status Every Day   Current packs/day: 0.50   Types: Cigarettes  Smokeless Tobacco Never    Labs: Review Flowsheet  More data exists      Latest Ref Rng & Units 07/27/2019 11/01/2019 02/08/2020 02/09/2021 11/13/2022  Labs for ITP Cardiac and Pulmonary Rehab  Cholestrol 0 - 200 mg/dL 409  - 811  914  782   LDL (calc) 0 - 99 mg/dL 956  - 213  086  578   HDL-C >40 mg/dL 37  - 44  47  41   Trlycerides <150 mg/dL 469  - 97  629  528   Hemoglobin A1c 4.8 - 5.6 % - 6.3  6.2  6.2  6.2  6.2  6.8  6.8  6.8  6.8  6.8     Details       Multiple values from one day are sorted in reverse-chronological order         Capillary Blood Glucose: Lab Results  Component Value Date  GLUCAP 91 02/14/2023   GLUCAP 122 (H) 01/14/2023   GLUCAP 111 (H) 01/10/2023   GLUCAP 90 01/10/2023   GLUCAP 126 (H) 11/13/2022     Exercise Target Goals: Exercise Program Goal: Individual exercise prescription set using results from initial 6 min walk test and THRR while considering  patient's activity barriers and safety.   Exercise Prescription Goal: Initial exercise prescription builds to 30-45 minutes a day of aerobic activity, 2-3 days per week.  Home exercise guidelines will be given to patient during program as part of exercise prescription that the participant will acknowledge.  Activity Barriers & Risk Stratification:  Activity Barriers & Cardiac Risk Stratification - 12/27/22 1111       Activity Barriers & Cardiac Risk Stratification   Activity Barriers Shortness of Breath;Joint Problems    Cardiac Risk Stratification High   <5 METs on            6  Minute Walk:  6 Minute Walk     Row Name 12/27/22 1256         6 Minute Walk   Phase Initial     Distance 1390 feet     Walk Time 6 minutes     # of Rest Breaks 2  very brief standing rest     MPH 2.64     METS 3     RPE 9     Perceived Dyspnea  0     VO2 Peak 10.51     Symptoms Yes (comment)     Comments 1/10 L calf pain- resolved with rest     Resting HR 70 bpm     Resting BP 146/84     Resting Oxygen Saturation  98 %     Exercise Oxygen Saturation  during 6 min walk 98 %     Max Ex. HR 93 bpm     Max Ex. BP 176/84     2 Minute Post BP 136/82              Oxygen Initial Assessment:   Oxygen Re-Evaluation:   Oxygen Discharge (Final Oxygen Re-Evaluation):   Initial Exercise Prescription:  Initial Exercise Prescription - 12/27/22 1200       Date of Initial Exercise RX and Referring Provider   Date 12/27/22    Referring Provider Tonny Bollman, MD    Expected Discharge Date 03/23/23      NuStep   Level 1    SPM 65    Minutes 15    METs 2      Recumbant Elliptical   Level 1    RPM 55    Watts 70    Minutes 15    METs 2.5      Prescription Details   Frequency (times per week) 3    Duration Progress to 30 minutes of continuous aerobic without signs/symptoms of physical distress      Intensity   THRR 40-80% of Max Heartrate 63-126    Ratings of Perceived Exertion 11-13    Perceived Dyspnea 0-4      Progression   Progression Continue progressive overload as per policy without signs/symptoms or physical distress.      Resistance Training   Training Prescription Yes    Weight 2    Reps 10-15             Perform Capillary Blood Glucose checks as needed.  Exercise Prescription Changes:   Exercise Prescription Changes     Row Name 01/10/23  1600             Response to Exercise   Blood Pressure (Admit) 122/80       Blood Pressure (Exercise) 152/84       Blood Pressure (Exit) 146/84       Heart Rate (Admit) 69 bpm       Heart  Rate (Exercise) 111 bpm       Heart Rate (Exit) 81 bpm       Rating of Perceived Exertion (Exercise) 10       Perceived Dyspnea (Exercise) 0       Symptoms None       Comments Pt first day in CRP2       Duration Continue with 30 min of aerobic exercise without signs/symptoms of physical distress.       Intensity THRR unchanged         Progression   Progression Continue to progress workloads to maintain intensity without signs/symptoms of physical distress.       Average METs 2.3         Resistance Training   Training Prescription Yes       Weight 2       Reps 10-15         Arm Ergometer   Level 1       Watts 16       Minutes 15       METs 1.6         Recumbant Elliptical   Level 1       RPM 54       Watts 78       Minutes 15       METs 3                Exercise Comments:   Exercise Comments     Row Name 01/10/23 1701 01/24/23 1401         Exercise Comments Pt first day in CRP2 program. Pt tolerated exercise well with an avg MET level of 2.3 with no s/sx. Pt did not like Nustep equipment, so switched to arm ergometer without issue. Pt is learning her THRR, RPE scale, and ExRx. Pt absent today. Will review education when she is able to attend on a more regular basis. Pt absent since 01/14/23               Exercise Goals and Review:   Exercise Goals     Row Name 12/27/22 1012             Exercise Goals   Increase Physical Activity Yes       Intervention Provide advice, education, support and counseling about physical activity/exercise needs.;Develop an individualized exercise prescription for aerobic and resistive training based on initial evaluation findings, risk stratification, comorbidities and participant's personal goals.       Expected Outcomes Short Term: Attend rehab on a regular basis to increase amount of physical activity.;Long Term: Exercising regularly at least 3-5 days a week.;Long Term: Add in home exercise to make exercise part of routine  and to increase amount of physical activity.       Increase Strength and Stamina Yes       Intervention Provide advice, education, support and counseling about physical activity/exercise needs.;Develop an individualized exercise prescription for aerobic and resistive training based on initial evaluation findings, risk stratification, comorbidities and participant's personal goals.       Expected Outcomes Short Term: Increase workloads from initial exercise  prescription for resistance, speed, and METs.;Short Term: Perform resistance training exercises routinely during rehab and add in resistance training at home;Long Term: Improve cardiorespiratory fitness, muscular endurance and strength as measured by increased METs and functional capacity ( )       Able to understand and use rate of perceived exertion (RPE) scale Yes       Intervention Provide education and explanation on how to use RPE scale       Expected Outcomes Short Term: Able to use RPE daily in rehab to express subjective intensity level;Long Term:  Able to use RPE to guide intensity level when exercising independently       Knowledge and understanding of Target Heart Rate Range (THRR) Yes       Intervention Provide education and explanation of THRR including how the numbers were predicted and where they are located for reference       Expected Outcomes Short Term: Able to state/look up THRR;Short Term: Able to use daily as guideline for intensity in rehab;Long Term: Able to use THRR to govern intensity when exercising independently       Understanding of Exercise Prescription Yes       Intervention Provide education, explanation, and written materials on patient's individual exercise prescription       Expected Outcomes Short Term: Able to explain program exercise prescription;Long Term: Able to explain home exercise prescription to exercise independently                Exercise Goals Re-Evaluation :  Exercise Goals Re-Evaluation      Row Name 01/10/23 1651             Exercise Goal Re-Evaluation   Exercise Goals Review Increase Physical Activity;Understanding of Exercise Prescription;Increase Strength and Stamina;Knowledge and understanding of Target Heart Rate Range (THRR);Able to understand and use rate of perceived exertion (RPE) scale       Comments Pt first day in CRP2 program. Pt tolerated exercise well with an avg MET level of 2.3 with no s/sx. Pt did not like Nustep equipment, so switched to arm ergometer without issue. Pt is learning her THRR, RPE scale, and ExRx.       Expected Outcomes Will continue to progress workloads as tolerated without s/sx.                Discharge Exercise Prescription (Final Exercise Prescription Changes):  Exercise Prescription Changes - 01/10/23 1600       Response to Exercise   Blood Pressure (Admit) 122/80    Blood Pressure (Exercise) 152/84    Blood Pressure (Exit) 146/84    Heart Rate (Admit) 69 bpm    Heart Rate (Exercise) 111 bpm    Heart Rate (Exit) 81 bpm    Rating of Perceived Exertion (Exercise) 10    Perceived Dyspnea (Exercise) 0    Symptoms None    Comments Pt first day in CRP2    Duration Continue with 30 min of aerobic exercise without signs/symptoms of physical distress.    Intensity THRR unchanged      Progression   Progression Continue to progress workloads to maintain intensity without signs/symptoms of physical distress.    Average METs 2.3      Resistance Training   Training Prescription Yes    Weight 2    Reps 10-15      Arm Ergometer   Level 1    Watts 16    Minutes 15    METs 1.6  Recumbant Elliptical   Level 1    RPM 54    Watts 78    Minutes 15    METs 3             Nutrition:  Target Goals: Understanding of nutrition guidelines, daily intake of sodium 1500mg , cholesterol 200mg , calories 30% from fat and 7% or less from saturated fats, daily to have 5 or more servings of fruits and  vegetables.  Biometrics:  Pre Biometrics - 12/27/22 1052       Pre Biometrics   Waist Circumference 46 inches    Hip Circumference 49 inches    Waist to Hip Ratio 0.94 %    Triceps Skinfold 37 mm    % Body Fat 50.5 %    Grip Strength 29 kg    Flexibility 12.5 in    Single Leg Stand 30 seconds              Nutrition Therapy Plan and Nutrition Goals:   Nutrition Assessments:  MEDIFICTS Score Key: >=70 Need to make dietary changes  40-70 Heart Healthy Diet <= 40 Therapeutic Level Cholesterol Diet   Flowsheet Row CARDIAC REHAB PHASE II EXERCISE from 01/10/2023 in [facility] for Heart, Vascular, & Lung Health  Picture Your Plate Total Score on Admission 53      Picture Your Plate Scores: <09 Unhealthy dietary pattern with much room for improvement. 41-50 Dietary pattern unlikely to meet recommendations for good health and room for improvement. 51-60 More healthful dietary pattern, with some room for improvement.  >60 Healthy dietary pattern, although there may be some specific behaviors that could be improved.    Nutrition Goals Re-Evaluation:   Nutrition Goals Re-Evaluation:   Nutrition Goals Discharge (Final Nutrition Goals Re-Evaluation):   Psychosocial: Target Goals: Acknowledge presence or absence of significant depression and/or stress, maximize coping skills, provide positive support system. Participant is able to verbalize types and ability to use techniques and skills needed for reducing stress and depression.  Initial Review & Psychosocial Screening:  Initial Psych Review & Screening - 12/27/22 1111       Initial Review   Current issues with Current Depression;Current Anxiety/Panic;Current Stress Concerns    Source of Stress Concerns Family;Occupation    Comments Clodagh shared that she currently has high levels of depression/anxiety/stress.  Mostly related to work and familial strains and church. She used to take medication  but recently stopped, wants to possibly resume. She is interested in seeing a counselor, will discuss with RN. Tonda does not have a PCP and has been looking for a new one, encouraged to discuss medications with PCP once established. Neenah stated she is not having any thoughts of harming herself or others, this has been an ongoing feeling since 2020.      Family Dynamics   Good Support System? Yes   Has her brother, but stated he is unreliable     Barriers   Psychosocial barriers to participate in program The patient should benefit from training in stress management and relaxation.      Screening Interventions   Interventions Provide feedback about the scores to participant;To provide support and resources with identified psychosocial needs;Encouraged to exercise    Expected Outcomes Long Term goal: The participant improves quality of Life and PHQ9 Scores as seen by post scores and/or verbalization of changes;Short Term goal: Identification and review with participant of any Quality of Life or Depression concerns found by scoring the questionnaire.;Long Term Goal: Stressors or  current issues are controlled or eliminated.;Short Term goal: Utilizing psychosocial counselor, staff and physician to assist with identification of specific Stressors or current issues interfering with healing process. Setting desired goal for each stressor or current issue identified.             Quality of Life Scores:  Quality of Life - 12/27/22 1258       Quality of Life   Select Quality of Life      Quality of Life Scores   Health/Function Pre 17.54 %    Socioeconomic Pre 23.9 %    Psych/Spiritual Pre 20.36 %    Family Pre 15 %    GLOBAL Pre 19.2 %            Scores of 19 and below usually indicate a poorer quality of life in these areas.  A difference of  2-3 points is a clinically meaningful difference.  A difference of 2-3 points in the total score of the Quality of Life Index has been associated  with significant improvement in overall quality of life, self-image, physical symptoms, and general health in studies assessing change in quality of life.  PHQ-9: Review Flowsheet  More data exists      12/27/2022 09/10/2021 06/30/2021 04/09/2021 02/09/2021  Depression screen PHQ 2/9  Decreased Interest 0 1 0 0 1  Down, Depressed, Hopeless 1 1 1  0 1  PHQ - 2 Score 1 2 1  0 2  Altered sleeping 0 1 1 0 0  Tired, decreased energy 2 1 1  0 0  Change in appetite 0 - 1 - 1  Feeling bad or failure about yourself  0 1 0 0 0  Trouble concentrating 0 0 0 0 0  Moving slowly or fidgety/restless 0 0 0 0 0  Suicidal thoughts 0 0 0 0 0  PHQ-9 Score 3 5 4  0 3  Difficult doing work/chores Somewhat difficult - Not difficult at all - -   Interpretation of Total Score  Total Score Depression Severity:  1-4 = Minimal depression, 5-9 = Mild depression, 10-14 = Moderate depression, 15-19 = Moderately severe depression, 20-27 = Severe depression   Psychosocial Evaluation and Intervention:   Psychosocial Re-Evaluation:  Psychosocial Re-Evaluation     Row Name 01/13/23 0857 02/11/23 1143           Psychosocial Re-Evaluation   Current issues with Current Anxiety/Panic;Current Stress Concerns;Current Depression Current Anxiety/Panic;Current Stress Concerns;Current Depression      Comments Yoland did not voice any increased concerns or stressors on her first day of exercise. Will review quality of life and PHQ2-9 in the upcoming week Will review quality of life and PHQ2-9 when [name] returns to exercise at cardiac rehab.      Expected Outcomes Ji will have controlled or decreased depression, anxiety/ panic upon compleition of cardiac rehab [place] will have controlled or decreased depression, anxiety/ panic upon compleition of cardiac rehab      Interventions Stress management education;Encouraged to attend Cardiac Rehabilitation for the exercise;Relaxation education Stress management education;Encouraged to attend  Cardiac Rehabilitation for the exercise;Relaxation education      Continue Psychosocial Services  Follow up required by staff Follow up required by staff        Initial Review   Source of Stress Concerns Chronic Illness;Family;Occupation Chronic Illness;Family;Occupation      Comments Will continue to monitor and offer support as needed. Will continue to monitor and offer support as needed.  Psychosocial Discharge (Final Psychosocial Re-Evaluation):  Psychosocial Re-Evaluation - 02/11/23 1143       Psychosocial Re-Evaluation   Current issues with Current Anxiety/Panic;Current Stress Concerns;Current Depression    Comments Will review quality of life and PHQ2-9 when Layal returns to exercise at cardiac rehab.    Expected Outcomes Jeiry will have controlled or decreased depression, anxiety/ panic upon compleition of cardiac rehab    Interventions Stress management education;Encouraged to attend Cardiac Rehabilitation for the exercise;Relaxation education    Continue Psychosocial Services  Follow up required by staff      Initial Review   Source of Stress Concerns Chronic Illness;Family;Occupation    Comments Will continue to monitor and offer support as needed.             Vocational Rehabilitation: Provide vocational rehab assistance to qualifying candidates.   Vocational Rehab Evaluation & Intervention:  Vocational Rehab - 12/27/22 1117       Initial Vocational Rehab Evaluation & Intervention   Assessment shows need for Vocational Rehabilitation No   Brenly is working            Education: Education Goals: Education classes will be provided on a weekly basis, covering required topics. Participant will state understanding/return demonstration of topics presented.    Education     Row Name 01/07/23 1400     Education   Cardiac Education Topics Pritikin   Glass blower/designer Nutrition   Nutrition Workshop  Targeting Your Nutrition Priorities   Instruction Review Code 1- Verbalizes Understanding   Class Start Time 1357   Class Stop Time 1436   Class Time Calculation (min) 39 min    Row Name 01/10/23 1600     Education   Cardiac Education Topics Pritikin   Geographical information systems officer Psychosocial   Psychosocial Workshop Focused Goals, Sustainable Changes   Instruction Review Code 1- Verbalizes Understanding   Class Start Time 1400   Class Stop Time 1440   Class Time Calculation (min) 40 min    Row Name 01/14/23 1400     Education   Cardiac Education Topics Pritikin   Licensed conveyancer Nutrition   Nutrition Dining Out - Part 1   Instruction Review Code 1- Verbalizes Understanding   Class Start Time 1358   Class Stop Time 1442   Class Time Calculation (min) 44 min    Row Name 02/14/23 1600     Education   Cardiac Education Topics Pritikin   Geographical information systems officer Psychosocial   Psychosocial Workshop From Head to Heart: The Power of a Healthy Outlook   Instruction Review Code 1- Verbalizes Understanding   Class Start Time 1400   Class Stop Time 1446   Class Time Calculation (min) 46 min            Core Videos: Exercise    Move It!  Clinical staff conducted group or individual video education with verbal and written material and guidebook.  Patient learns the recommended Pritikin exercise program. Exercise with the goal of living a long, healthy life. Some of the health benefits of exercise include controlled diabetes, healthier blood pressure levels, improved cholesterol levels, improved heart and lung capacity, improved sleep, and better body composition. Everyone should speak  with their doctor before starting or changing an exercise routine.  Biomechanical Limitations Clinical staff conducted group or individual video  education with verbal and written material and guidebook.  Patient learns how biomechanical limitations can impact exercise and how we can mitigate and possibly overcome limitations to have an impactful and balanced exercise routine.  Body Composition Clinical staff conducted group or individual video education with verbal and written material and guidebook.  Patient learns that body composition (ratio of muscle mass to fat mass) is a key component to assessing overall fitness, rather than body weight alone. Increased fat mass, especially visceral belly fat, can put [place] at increased risk for metabolic syndrome, type 2 diabetes, heart disease, and even death. It is recommended to combine diet and exercise (cardiovascular and resistance training) to improve your body composition. Seek guidance from your physician and exercise physiologist before implementing an exercise routine.  Exercise Action Plan Clinical staff conducted group or individual video education with verbal and written material and guidebook.  Patient learns the recommended strategies to achieve and enjoy long-term exercise adherence, including variety, self-motivation, self-efficacy, and positive decision making. Benefits of exercise include fitness, good health, weight management, more energy, better sleep, less stress, and overall well-being.  Medical   Heart Disease Risk Reduction Clinical staff conducted group or individual video education with verbal and written material and guidebook.  Patient learns our heart is our most vital organ as it circulates oxygen, nutrients, white blood cells, and hormones throughout the entire body, and carries waste away. Data supports a plant-based eating plan like the Pritikin Program for its effectiveness in slowing progression of and reversing heart disease. The video provides a number of recommendations to address heart disease.   Metabolic Syndrome and Belly Fat  Clinical staff conducted group  or individual video education with verbal and written material and guidebook.  Patient learns what metabolic syndrome is, how it leads to heart disease, and how one can reverse it and keep it from coming back. You have metabolic syndrome if you have 3 of the following 5 criteria: abdominal obesity, high blood pressure, high triglycerides, low HDL cholesterol, and high blood sugar.  Hypertension and Heart Disease Clinical staff conducted group or individual video education with verbal and written material and guidebook.  Patient learns that high blood pressure, or hypertension, is very common in the [place]. Hypertension is largely due to excessive salt intake, but other important risk factors include being overweight, physical inactivity, drinking too much alcohol, smoking, and not eating enough potassium from fruits and vegetables. High blood pressure is a leading risk factor for heart attack, stroke, congestive heart failure, dementia, kidney failure, and premature death. Long-term effects of excessive salt intake include stiffening of the arteries and thickening of heart muscle and organ damage. Recommendations include ways to reduce hypertension and the risk of heart disease.  Diseases of Our Time - Focusing on Diabetes Clinical staff conducted group or individual video education with verbal and written material and guidebook.  Patient learns why the best way to stop diseases of our time is prevention, through food and other lifestyle changes. Medicine (such as prescription pills and surgeries) is often only a Band-Aid on the problem, not a long-term solution. Most common diseases of our time include obesity, type 2 diabetes, hypertension, heart disease, and cancer. The Pritikin Program is recommended and has been proven to help reduce, reverse, and/or prevent the damaging effects of metabolic syndrome.  Nutrition   Overview of the Pritikin Eating  Plan  Clinical staff conducted group or  individual video education with verbal and written material and guidebook.  Patient learns about the Pritikin Eating Plan for disease risk reduction. The Pritikin Eating Plan emphasizes a wide variety of unrefined, minimally-processed carbohydrates, like fruits, vegetables, whole grains, and legumes. Go, Caution, and Stop food choices are explained. Plant-based and lean animal proteins are emphasized. Rationale provided for low sodium intake for blood pressure control, low added sugars for blood sugar stabilization, and low added fats and oils for coronary artery disease risk reduction and weight management.  Calorie Density  Clinical staff conducted group or individual video education with verbal and written material and guidebook.  Patient learns about calorie density and how it impacts the Pritikin Eating Plan. Knowing the characteristics of the food you choose will help you decide whether those foods will lead to weight gain or weight loss, and whether you want to consume more or less of them. Weight loss is usually a side effect of the Pritikin Eating Plan because of its focus on low calorie-dense foods.  Label Reading  Clinical staff conducted group or individual video education with verbal and written material and guidebook.  Patient learns about the Pritikin recommended label reading guidelines and corresponding recommendations regarding calorie density, added sugars, sodium content, and whole grains.  Dining Out - Part 1  Clinical staff conducted group or individual video education with verbal and written material and guidebook.  Patient learns that restaurant meals can be sabotaging because they can be so high in calories, fat, sodium, and/or sugar. Patient learns recommended strategies on how to positively address this and avoid unhealthy pitfalls.  Facts on Fats  Clinical staff conducted group or individual video education with verbal and written material and guidebook.  Patient learns  that lifestyle modifications can be just as effective, if not more so, as many medications for lowering your risk of heart disease. A Pritikin lifestyle can help to reduce your risk of inflammation and atherosclerosis (cholesterol build-up, or plaque, in the artery walls). Lifestyle interventions such as dietary choices and physical activity address the cause of atherosclerosis. A review of the types of fats and their impact on blood cholesterol levels, along with dietary recommendations to reduce fat intake is also included.  Nutrition Action Plan  Clinical staff conducted group or individual video education with verbal and written material and guidebook.  Patient learns how to incorporate Pritikin recommendations into their lifestyle. Recommendations include planning and keeping personal health goals in mind as an important part of their success.  Healthy Mind-Set    Healthy Minds, Bodies, Hearts  Clinical staff conducted group or individual video education with verbal and written material and guidebook.  Patient learns how to identify when they are stressed. Video will discuss the impact of that stress, as well as the many benefits of stress management. Patient will also be introduced to stress management techniques. The way we think, act, and feel has an impact on our hearts.  How Our Thoughts Can Heal Our Hearts  Clinical staff conducted group or individual video education with verbal and written material and guidebook.  Patient learns that negative thoughts can cause depression and anxiety. This can result in negative lifestyle behavior and serious health problems. Cognitive behavioral therapy is an effective method to help control our thoughts in order to change and improve our emotional outlook.  Additional Videos:  Exercise    Improving Performance  Clinical staff conducted group or individual video education with verbal and  written material and guidebook.  Patient learns to use a  non-linear approach by alternating intensity levels and lengths of time spent exercising to help burn more calories and lose more body fat. Cardiovascular exercise helps improve heart health, metabolism, hormonal balance, blood sugar control, and recovery from fatigue. Resistance training improves strength, endurance, balance, coordination, reaction time, metabolism, and muscle mass. Flexibility exercise improves circulation, posture, and balance. Seek guidance from your physician and exercise physiologist before implementing an exercise routine and learn your capabilities and proper form for all exercise.  Introduction to Yoga  Clinical staff conducted group or individual video education with verbal and written material and guidebook.  Patient learns about yoga, a discipline of the coming together of mind, breath, and body. The benefits of yoga include improved flexibility, improved range of motion, better posture and core strength, increased lung function, weight loss, and positive self-image. Yoga's heart health benefits include lowered blood pressure, healthier heart rate, decreased cholesterol and triglyceride levels, improved immune function, and reduced stress. Seek guidance from your physician and exercise physiologist before implementing an exercise routine and learn your capabilities and proper form for all exercise.  Medical   Aging: Enhancing Your Quality of Life  Clinical staff conducted group or individual video education with verbal and written material and guidebook.  Patient learns key strategies and recommendations to stay in good physical health and enhance quality of life, such as prevention strategies, having an advocate, securing a Health Care Proxy and Power of Attorney, and keeping a list of medications and system for tracking them. It also discusses how to avoid risk for bone loss.  Biology of Weight Control  Clinical staff conducted group or individual video education with  verbal and written material and guidebook.  Patient learns that weight gain occurs because we consume more calories than we burn (eating more, moving less). Even if your body weight is normal, you may have higher ratios of fat compared to muscle mass. Too much body fat puts you at increased risk for cardiovascular disease, heart attack, stroke, type 2 diabetes, and obesity-related cancers. In addition to exercise, following the Pritikin Eating Plan can help reduce your risk.  Decoding Lab Results  Clinical staff conducted group or individual video education with verbal and written material and guidebook.  Patient learns that lab test reflects one measurement whose values change over time and are influenced by many factors, including medication, stress, sleep, exercise, food, hydration, pre-existing medical conditions, and more. It is recommended to use the knowledge from this video to become more involved with your lab results and evaluate your numbers to speak with your doctor.   Diseases of Our Time - Overview  Clinical staff conducted group or individual video education with verbal and written material and guidebook.  Patient learns that according to the CDC, 50% to 70% of chronic diseases (such as obesity, type 2 diabetes, elevated lipids, hypertension, and heart disease) are avoidable through lifestyle improvements including healthier food choices, listening to satiety cues, and increased physical activity.  Sleep Disorders Clinical staff conducted group or individual video education with verbal and written material and guidebook.  Patient learns how good quality and duration of sleep are important to overall health and well-being. Patient also learns about sleep disorders and how they impact health along with recommendations to address them, including discussing with a physician.  Nutrition  Dining Out - Part 2 Clinical staff conducted group or individual video education with verbal and  written material and guidebook.  Patient learns how to plan ahead and communicate in order to maximize their dining experience in a healthy and nutritious manner. Included are recommended food choices based on the type of restaurant the patient is visiting.   Fueling a Banker conducted group or individual video education with verbal and written material and guidebook.  There is a strong connection between our food choices and our health. Diseases like obesity and type 2 diabetes are very prevalent and are in large-part due to lifestyle choices. The Pritikin Eating Plan provides plenty of food and hunger-curbing satisfaction. It is easy to follow, affordable, and helps reduce health risks.  Menu Workshop  Clinical staff conducted group or individual video education with verbal and written material and guidebook.  Patient learns that restaurant meals can sabotage health goals because they are often packed with calories, fat, sodium, and sugar. Recommendations include strategies to plan ahead and to communicate with the manager, chef, or server to help order a healthier meal.  Planning Your Eating Strategy  Clinical staff conducted group or individual video education with verbal and written material and guidebook.  Patient learns about the Pritikin Eating Plan and its benefit of reducing the risk of disease. The Pritikin Eating Plan does not focus on calories. Instead, it emphasizes high-quality, nutrient-rich foods. By knowing the characteristics of the foods, we choose, we can determine their calorie density and make informed decisions.  Targeting Your Nutrition Priorities  Clinical staff conducted group or individual video education with verbal and written material and guidebook.  Patient learns that lifestyle habits have a tremendous impact on disease risk and progression. This video provides eating and physical activity recommendations based on your personal health goals,  such as reducing LDL cholesterol, losing weight, preventing or controlling type 2 diabetes, and reducing high blood pressure.  Vitamins and Minerals  Clinical staff conducted group or individual video education with verbal and written material and guidebook.  Patient learns different ways to obtain key vitamins and minerals, including through a recommended healthy diet. It is important to discuss all supplements you take with your doctor.   Healthy Mind-Set    Smoking Cessation  Clinical staff conducted group or individual video education with verbal and written material and guidebook.  Patient learns that cigarette smoking and tobacco addiction pose a serious health risk which affects millions of people. Stopping smoking will significantly reduce the risk of heart disease, lung disease, and many forms of cancer. Recommended strategies for quitting are covered, including working with your doctor to develop a successful plan.  Culinary   Becoming a Set designer conducted group or individual video education with verbal and written material and guidebook.  Patient learns that cooking at home can be healthy, cost-effective, quick, and puts them in control. Keys to cooking healthy recipes will include looking at your recipe, assessing your equipment needs, planning ahead, making it simple, choosing cost-effective seasonal ingredients, and limiting the use of added fats, salts, and sugars.  Cooking - Breakfast and Snacks  Clinical staff conducted group or individual video education with verbal and written material and guidebook.  Patient learns how important breakfast is to satiety and nutrition through the entire day. Recommendations include key foods to eat during breakfast to help stabilize blood sugar levels and to prevent overeating at meals later in the day. Planning ahead is also a key component.  Cooking - Educational psychologist conducted group or individual video  education with verbal  and written material and guidebook.  Patient learns eating strategies to improve overall health, including an approach to cook more at home. Recommendations include thinking of animal protein as a side on your plate rather than center stage and focusing instead on lower calorie dense options like vegetables, fruits, whole grains, and plant-based proteins, such as beans. Making sauces in large quantities to freeze for later and leaving the skin on your vegetables are also recommended to maximize your experience.  Cooking - Healthy Salads and Dressing Clinical staff conducted group or individual video education with verbal and written material and guidebook.  Patient learns that vegetables, fruits, whole grains, and legumes are the foundations of the Pritikin Eating Plan. Recommendations include how to incorporate each of these in flavorful and healthy salads, and how to create homemade salad dressings. Proper handling of ingredients is also covered. Cooking - Soups and [employer] - Soups and Desserts Clinical staff conducted group or individual video education with verbal and written material and guidebook.  Patient learns that Pritikin soups and desserts make for easy, nutritious, and delicious snacks and meal components that are low in sodium, fat, sugar, and calorie density, while high in vitamins, minerals, and filling fiber. Recommendations include simple and healthy ideas for soups and desserts.   Overview     The Pritikin Solution Program Overview Clinical staff conducted group or individual video education with verbal and written material and guidebook.  Patient learns that the results of the Pritikin Program have been documented in more than 100 articles published in peer-reviewed journals, and the benefits include reducing risk factors for (and, in some cases, even reversing) high cholesterol, high blood pressure, type 2 diabetes, obesity, and more! An overview of  the three key pillars of the Pritikin Program will be covered: eating well, doing regular exercise, and having a healthy mind-set.  WORKSHOPS  Exercise: Exercise Basics: Building Your Action Plan Clinical staff led group instruction and group discussion with PowerPoint presentation and patient guidebook. To enhance the learning environment the use of posters, models and videos may be added. At the conclusion of this workshop, patients will comprehend the difference between physical activity and exercise, as well as the benefits of incorporating both, into their routine. Patients will understand the FITT (Frequency, Intensity, Time, and Type) principle and how to use it to build an exercise action plan. In addition, safety concerns and other considerations for exercise and cardiac rehab will be addressed by the presenter. The purpose of this lesson is to promote a comprehensive and effective weekly exercise routine in order to improve patients' overall level of fitness.   Managing Heart Disease: Your Path to a Healthier Heart Clinical staff led group instruction and group discussion with PowerPoint presentation and patient guidebook. To enhance the learning environment the use of posters, models and videos may be added.At the conclusion of this workshop, patients will understand the anatomy and physiology of the heart. Additionally, they will understand how Pritikin's three pillars impact the risk factors, the progression, and the management of heart disease.  The purpose of this lesson is to provide a high-level overview of the heart, heart disease, and how the Pritikin lifestyle positively impacts risk factors.  Exercise Biomechanics Clinical staff led group instruction and group discussion with PowerPoint presentation and patient guidebook. To enhance the learning environment the use of posters, models and videos may be added. Patients will learn how the structural parts of their bodies  function and how these functions impact their daily  activities, movement, and exercise. Patients will learn how to promote a neutral spine, learn how to manage pain, and identify ways to improve their physical movement in order to promote healthy living. The purpose of this lesson is to expose patients to common physical limitations that impact physical activity. Participants will learn practical ways to adapt and manage aches and pains, and to minimize their effect on regular exercise. Patients will learn how to maintain good posture while sitting, walking, and lifting.  Balance Training and Fall Prevention  Clinical staff led group instruction and group discussion with PowerPoint presentation and patient guidebook. To enhance the learning environment the use of posters, models and videos may be added. At the conclusion of this workshop, patients will understand the importance of their sensorimotor skills (vision, proprioception, and the vestibular system) in maintaining their ability to balance as they age. Patients will apply a variety of balancing exercises that are appropriate for their current level of function. Patients will understand the common causes for poor balance, possible solutions to these problems, and ways to modify their physical environment in order to minimize their fall risk. The purpose of this lesson is to teach patients about the importance of maintaining balance as they age and ways to minimize their risk of falling.  WORKSHOPS   Nutrition:  Fueling a Ship broker led group instruction and group discussion with PowerPoint presentation and patient guidebook. To enhance the learning environment the use of posters, models and videos may be added. Patients will review the foundational principles of the Pritikin Eating Plan and understand what constitutes a serving size in each of the food groups. Patients will also learn Pritikin-friendly foods that are better  choices when away from home and review make-ahead meal and snack options. Calorie density will be reviewed and applied to three nutrition priorities: weight maintenance, weight loss, and weight gain. The purpose of this lesson is to reinforce (in a group setting) the key concepts around what patients are recommended to eat and how to apply these guidelines when away from home by planning and selecting Pritikin-friendly options. Patients will understand how calorie density may be adjusted for different weight management goals.  Mindful Eating  Clinical staff led group instruction and group discussion with PowerPoint presentation and patient guidebook. To enhance the learning environment the use of posters, models and videos may be added. Patients will briefly review the concepts of the Pritikin Eating Plan and the importance of low-calorie dense foods. The concept of mindful eating will be introduced as well as the importance of paying attention to internal hunger signals. Triggers for non-hunger eating and techniques for dealing with triggers will be explored. The purpose of this lesson is to provide patients with the opportunity to review the basic principles of the Pritikin Eating Plan, discuss the value of eating mindfully and how to measure internal cues of hunger and fullness using the Hunger Scale. Patients will also discuss reasons for non-hunger eating and learn strategies to use for controlling emotional eating.  Targeting Your Nutrition Priorities Clinical staff led group instruction and group discussion with PowerPoint presentation and patient guidebook. To enhance the learning environment the use of posters, models and videos may be added. Patients will learn how to determine their genetic susceptibility to disease by reviewing their family history. Patients will gain insight into the importance of diet as part of an overall healthy lifestyle in mitigating the impact of genetics and other  environmental insults. The purpose of this lesson is  to provide patients with the opportunity to assess their personal nutrition priorities by looking at their family history, their own health history and current risk factors. Patients will also be able to discuss ways of prioritizing and modifying the Pritikin Eating Plan for their highest risk areas  Menu  Clinical staff led group instruction and group discussion with PowerPoint presentation and patient guidebook. To enhance the learning environment the use of posters, models and videos may be added. Using menus brought in from [employer], or printed from [employer], patients will apply the Pritikin dining out guidelines that were presented in the [employer] video. Patients will also be able to practice these guidelines in a variety of provided scenarios. The purpose of this lesson is to provide patients with the opportunity to practice hands-on learning of the Pritikin Dining Out guidelines with actual menus and practice scenarios.  Label Reading Clinical staff led group instruction and group discussion with PowerPoint presentation and patient guidebook. To enhance the learning environment the use of posters, models and videos may be added. Patients will review and discuss the Pritikin label reading guidelines presented in Pritikin's Label Reading Educational series video. Using fool labels brought in from local grocery stores and markets, patients will apply the label reading guidelines and determine if the packaged food meet the Pritikin guidelines. The purpose of this lesson is to provide patients with the opportunity to review, discuss, and practice hands-on learning of the Pritikin Label Reading guidelines with actual packaged food labels. Cooking School  Pritikin's [employer] are designed to teach patients ways to prepare quick, simple, and affordable recipes at home. The importance of nutrition's role in  chronic disease risk reduction is reflected in its emphasis in the overall Pritikin program. By learning how to prepare essential core Pritikin Eating Plan recipes, patients will increase control over what they eat; be able to customize the flavor of foods without the use of added salt, sugar, or fat; and improve the quality of the food they consume. By learning a set of core recipes which are easily assembled, quickly prepared, and affordable, patients are more likely to prepare more healthy foods at home. These workshops focus on convenient breakfasts, simple entres, side dishes, and desserts which can be prepared with minimal effort and are consistent with nutrition recommendations for cardiovascular risk reduction. Cooking [employer] are taught by a Armed forces logistics/support/administrative officer (RD) who has been trained by the [employer]. The chef or RD has a clear understanding of the importance of minimizing - if not completely eliminating - added fat, sugar, and sodium in recipes. Throughout the series of Cooking School Workshop sessions, patients will learn about healthy ingredients and efficient methods of cooking to build confidence in their capability to prepare    Cooking School weekly topics:  Adding Flavor- Sodium-Free  Fast and Healthy Breakfasts  Powerhouse Plant-Based Proteins  Satisfying Salads and Dressings  Simple Sides and Sauces  International Cuisine-Spotlight on the [employer] Zones  Delicious Desserts  Savory Soups  [employer] - Meals in a Astronomer Appetizers and Snacks  Comforting Weekend Breakfasts  One-Pot Wonders   Fast Evening Meals  Landscape architect Your Pritikin Plate  WORKSHOPS   Healthy Mindset (Psychosocial):  Focused Goals, Sustainable Changes Clinical staff led group instruction and group discussion with PowerPoint presentation and patient guidebook. To enhance the learning environment the use of posters, models and videos may  be added. Patients will be able  to apply effective goal setting strategies to establish at least one personal goal, and then take consistent, meaningful action toward that goal. They will learn to identify common barriers to achieving personal goals and develop strategies to overcome them. Patients will also gain an understanding of how our mind-set can impact our ability to achieve goals and the importance of cultivating a positive and growth-oriented mind-set. The purpose of this lesson is to provide patients with a deeper understanding of how to set and achieve personal goals, as well as the tools and strategies needed to overcome common obstacles which may arise along the way.  From Head to Heart: The Power of a Healthy Outlook  Clinical staff led group instruction and group discussion with PowerPoint presentation and patient guidebook. To enhance the learning environment the use of posters, models and videos may be added. Patients will be able to recognize and describe the impact of emotions and mood on physical health. They will discover the importance of self-care and explore self-care practices which may work for them. Patients will also learn how to utilize the 4 C's to cultivate a healthier outlook and better manage stress and challenges. The purpose of this lesson is to demonstrate to patients how a healthy outlook is an essential part of maintaining good health, especially as they continue their cardiac rehab journey.  Healthy Sleep for a Healthy Heart Clinical staff led group instruction and group discussion with PowerPoint presentation and patient guidebook. To enhance the learning environment the use of posters, models and videos may be added. At the conclusion of this workshop, patients will be able to demonstrate knowledge of the importance of sleep to overall health, well-being, and quality of life. They will understand the symptoms of, and treatments for, common sleep disorders. Patients  will also be able to identify daytime and nighttime behaviors which impact sleep, and they will be able to apply these tools to help manage sleep-related challenges. The purpose of this lesson is to provide patients with a general overview of sleep and outline the importance of quality sleep. Patients will learn about a few of the most common sleep disorders. Patients will also be introduced to the concept of "sleep hygiene," and discover ways to self-manage certain sleeping problems through simple daily behavior changes. Finally, the workshop will motivate patients by clarifying the links between quality sleep and their goals of heart-healthy living.   Recognizing and Reducing Stress Clinical staff led group instruction and group discussion with PowerPoint presentation and patient guidebook. To enhance the learning environment the use of posters, models and videos may be added. At the conclusion of this workshop, patients will be able to understand the types of stress reactions, differentiate between acute and chronic stress, and recognize the impact that chronic stress has on their health. They will also be able to apply different coping mechanisms, such as reframing negative self-talk. Patients will have the opportunity to practice a variety of stress management techniques, such as deep abdominal breathing, progressive muscle relaxation, and/or guided imagery.  The purpose of this lesson is to educate patients on the role of stress in their lives and to provide healthy techniques for coping with it.  Learning Barriers/Preferences:  Learning Barriers/Preferences - 12/27/22 1117       Learning Barriers/Preferences   Learning Barriers Sight   glasses   Learning Preferences Audio;Computer/Internet;Group Instruction;Individual Instruction;Pictoral;Skilled Demonstration;Video;Written Material;Verbal Instruction             Education Topics:  Knowledge Questionnaire Score:  Knowledge  Questionnaire  Score - 12/27/22 1117       Knowledge Questionnaire Score   Pre Score 26/28             Core Components/Risk Factors/Patient Goals at Admission:  Personal Goals and Risk Factors at Admission - 12/27/22 1012       Core Components/Risk Factors/Patient Goals on Admission    Weight Management Yes;Obesity;Weight Loss    Intervention Weight Management: Provide education and appropriate resources to help participant work on and attain dietary goals.;Weight Management: Develop a combined nutrition and exercise program designed to reach desired caloric intake, while maintaining appropriate intake of nutrient and fiber, sodium and fats, and appropriate energy expenditure required for the weight goal.;Weight Management/Obesity: Establish reasonable short term and long term weight goals.;Obesity: Provide education and appropriate resources to help participant work on and attain dietary goals.    Expected Outcomes Short Term: Continue to assess and modify interventions until short term weight is achieved;Long Term: Adherence to nutrition and physical activity/exercise program aimed toward attainment of established weight goal;Weight Loss: Understanding of general recommendations for a balanced deficit meal plan, which promotes 1-2 lb weight loss per week and includes a negative energy balance of [phone] kcal/d;Understanding recommendations for meals to include 15-35% energy as protein, 25-35% energy from fat, 35-60% energy from carbohydrates, less than 200mg  of dietary cholesterol, 20-35 gm of total fiber daily;Understanding of distribution of calorie intake throughout the day with the consumption of 4-5 meals/snacks    Tobacco Cessation Yes    Number of packs per day 2 cigarettes    Intervention Assist the participant in steps to quit. Provide individualized education and counseling about committing to Tobacco Cessation, relapse prevention, and pharmacological support that can be provided by  physician.;Education officer, environmental, assist with locating and accessing local/national Quit Smoking programs, and support quit date choice.    Expected Outcomes Short Term: Will quit all tobacco product use, adhering to prevention of relapse plan.;Long Term: Complete abstinence from all tobacco products for at least 12 months from quit date.;Short Term: Will demonstrate readiness to quit, by selecting a quit date.    Diabetes Yes    Intervention Provide education about signs/symptoms and action to take for hypo/hyperglycemia.;Provide education about proper nutrition, including hydration, and aerobic/resistive exercise prescription along with prescribed medications to achieve blood glucose in normal ranges: Fasting glucose 65-99 mg/dL    Expected Outcomes Long Term: Attainment of HbA1C < 7%.;Short Term: Participant verbalizes understanding of the signs/symptoms and immediate care of hyper/hypoglycemia, proper foot care and importance of medication, aerobic/resistive exercise and nutrition plan for blood glucose control.    Hypertension Yes    Intervention Provide education on lifestyle modifcations including regular physical activity/exercise, weight management, moderate sodium restriction and increased consumption of fresh fruit, vegetables, and low fat dairy, alcohol moderation, and smoking cessation.;Monitor prescription use compliance.    Expected Outcomes Short Term: Continued assessment and intervention until BP is < 140/6mm HG in hypertensive participants. < 130/46mm HG in hypertensive participants with diabetes, heart failure or chronic kidney disease.;Long Term: Maintenance of blood pressure at goal levels.    Lipids Yes    Intervention Provide education and support for participant on nutrition & aerobic/resistive exercise along with prescribed medications to achieve LDL 70mg , HDL >40mg .    Expected Outcomes Short Term: Participant states understanding of desired cholesterol values and is  compliant with medications prescribed. Participant is following exercise prescription and nutrition guidelines.;Long Term: Cholesterol controlled with medications as prescribed, with individualized exercise RX and  with personalized nutrition plan. Value goals: LDL < 70mg , HDL > 40 mg.    Stress Yes    Intervention Refer participants experiencing significant psychosocial distress to appropriate mental health specialists for further evaluation and treatment. When possible, include family members and significant others in education/counseling sessions.;Offer individual and/or small group education and counseling on adjustment to heart disease, stress management and health-related lifestyle change. Teach and support self-help strategies.    Expected Outcomes Long Term: Emotional wellbeing is indicated by absence of clinically significant psychosocial distress or social isolation.;Short Term: Participant demonstrates changes in health-related behavior, relaxation and other stress management skills, ability to obtain effective social support, and compliance with psychotropic medications if prescribed.             Core Components/Risk Factors/Patient Goals Review:   Goals and Risk Factor Review     Row Name 01/13/23 1478 01/18/23 1345 02/11/23 1145 02/15/23 1446       Core Components/Risk Factors/Patient Goals Review   Personal Goals Review Weight Management/Obesity;Lipids;Hypertension;Stress;Tobacco Cessation;Diabetes Weight Management/Obesity;Lipids;Hypertension;Stress;Tobacco Cessation;Diabetes Weight Management/Obesity;Lipids;Hypertension;Stress;Tobacco Cessation;Diabetes Weight Management/Obesity;Lipids;Hypertension;Stress;Tobacco Cessation;Diabetes    Review Wanna started cardiac rehab on 01/10/23. Zinnia did well with exercise. Vital signs were stable. Sport checked CBG's WNL. Makynlee is not on diabetic medication at this time. Will monitor BP's as exertional systolic BP's in the 90's. Breyona is off to a  good start to exercise. Vital signs have been stable. Encouraged tobacco cessation. Gave phone number and hand out to [facility] quit line. Stephana plans to return to exercise on 02/13/33. Faizah's last day of exercise was on 01/14/23. Antoine returned to exercise at cardiac rehab on 02/14/23. Exercise stopped due to elevated BP exercise is now on hold due to elevated BP.    Expected Outcomes Hooria will continue to participate in cardia c rehab for exercise, nutrtion and lifestyle modifications. Will discuss and encourage smoking cessation Yexalen will continue to participate in cardia c rehab for exercise, nutrtion and lifestyle modifications. Will discuss and encourage smoking cessation Keylin will continue to participate in cardia c rehab for exercise, nutrtion and lifestyle modifications. Will discuss and encourage smoking cessation Dazani will continue to participate in cardia c rehab for exercise, nutrtion and lifestyle modifications. Will discuss and encourage smoking cessation             Core Components/Risk Factors/Patient Goals at Discharge (Final Review):   Goals and Risk Factor Review - 02/15/23 1446       Core Components/Risk Factors/Patient Goals Review   Personal Goals Review Weight Management/Obesity;Lipids;Hypertension;Stress;Tobacco Cessation;Diabetes    Review Sujey returned to exercise at cardiac rehab on 02/14/23. Exercise stopped due to elevated BP exercise is now on hold due to elevated BP.    Expected Outcomes Lachele will continue to participate in cardia c rehab for exercise, nutrtion and lifestyle modifications. Will discuss and encourage smoking cessation             ITP Comments:  ITP Comments     Row Name 12/27/22 1011 01/13/23 0852 01/18/23 1343 02/11/23 1141 02/15/23 1449   ITP Comments Dr. Armanda Magic medical director. Introduction to pritikin education/intensive cardiac rehab. Initial orientation packet reviewed with patient. 30 Day ITP Review. Pina started caridac rehab. Anaiah did  well with exercise. 30 Day ITP Review. Shalinda is off to a good start to exercise at  cardiac rehab. 30 Day ITP Review. Kalise has not exercised since 01/14/23. Donnetta plans to return to exercise on 02/14/23. 30 Day ITP Review.  Courtnie  returned  to  exercise on 02/14/23. Exercise is now on hold due to elevated BP            Comments: See ITP comments.Thayer Headings RN BSN

## 2023-02-15 NOTE — Telephone Encounter (Signed)
error 

## 2023-02-15 NOTE — Telephone Encounter (Signed)
Pt calling back due to not receiving a cb. Very concerned about her bp readings. She was not allowed to do cardiac rehab to due readings. Requesting cb asap

## 2023-02-16 ENCOUNTER — Ambulatory Visit (HOSPITAL_COMMUNITY): Payer: Managed Care, Other (non HMO)

## 2023-02-16 ENCOUNTER — Encounter (HOSPITAL_COMMUNITY): Payer: Commercial Managed Care - HMO

## 2023-02-16 MED ORDER — AMLODIPINE BESYLATE 5 MG PO TABS: 5.0000 mg | ORAL_TABLET | Freq: Every day | ORAL | 0 refills | Status: DC

## 2023-02-16 NOTE — Telephone Encounter (Signed)
 Reviewed note from [facility] in Cardiac Rehab. Let's add Amlodipine  5mg  daily (I would just give her a 30 day prescription for now in case we end up changing this).. Continue current doses of Coreg  and Losartan  for now. Recommend patient keep a log of her BP/HR and bring this to visit with me next week. We can adjust things further at that time if needed.  Thank you!

## 2023-02-16 NOTE — Telephone Encounter (Signed)
 Spoke with patient and she stated her BP has been elevated for over a month. Being that her BP has been elevated she has not been able to do cardiac rehab in over two weeks. She is asymptomatic but feels her medication is not working. She does not have any readings to give but Hadassah gave us  readings that were taken in cardiac rehab.   She has follow up with Callie on Monday but she is very upset that she has not received call back from Monday.

## 2023-02-16 NOTE — Telephone Encounter (Addendum)
 Spoke with patient and she is aware of provider recommendations. She verbalized understanding.  Amlodipine  sen to pharmacy

## 2023-02-16 NOTE — Telephone Encounter (Signed)
 I have an upset patient who is calling to speak with manager because she says that she was supposed to receive a phone call that was never received yesterday which was very important. Said that nurse told her someone would call her back asap but never did

## 2023-02-16 NOTE — Addendum Note (Signed)
Addended by: Judene Companion on: 02/16/2023 11:43 AM   Modules accepted: Orders

## 2023-02-17 ENCOUNTER — Telehealth: Payer: Self-pay

## 2023-02-17 ENCOUNTER — Ambulatory Visit
Admission: EM | Admit: 2023-02-17 | Discharge: 2023-02-17 | Disposition: A | Discharge status: Home / Self Care | Payer: Commercial Managed Care - HMO | Attending: Physician Assistant | Admitting: Physician Assistant

## 2023-02-17 DIAGNOSIS — J069 Acute upper respiratory infection, unspecified: Secondary | ICD-10-CM | POA: Insufficient documentation

## 2023-02-17 DIAGNOSIS — M21862 Other specified acquired deformities of left lower leg: Secondary | ICD-10-CM | POA: Insufficient documentation

## 2023-02-17 LAB — POCT INFLUENZA A/B
Influenza A, POC: NEGATIVE
Influenza B, POC: NEGATIVE

## 2023-02-17 NOTE — Telephone Encounter (Signed)
   Called and spoke with patient. She expressed that she is very frustrated by our office's lack of communication and not getting back in touch with her in a timely manner. She said she has been trying to get in touch with me for 2 weeks about her BP. I apologized for any miscommunication from our office multiple times.  I did receive a message from the cardiac rehab RN on Monday 02/16/2023 asking me to review her BP and determined whether any changes needed to be made but there was no indication in that message that patient was worried about her BP and requesting a call. I did confirm that she had a follow-up with me in the office next week on 02/21/2023. I was not notified that she was trying to reach me about concerns for her BP until yesterday 02/17/2023 at which time I responded (within 1 hour of receiving the phone note) with medication changes. She again expressed frustration about why the message had not been sent to me before. I tried to explain our triage process and again apologized. She has a visit with me on Monday 02/21/2023 and said we would talk about this more. She also expressed frustration that a production designer, theatre/television/film never called her back yesterday. I told her that I would review the chart more and try to see where the breakdown was.   On a side note, she states she has not been called by [facility] that the Amlodipine  prescription we ordered yesterday is ready to be picked. Recommended calling and checking with [facility]. She voiced understanding.  Duration of phone call and review of her chart was greater than 20 minutes.  Angelle Isais E Deante Blough, PA-C 02/17/2023 4:14 PM

## 2023-02-17 NOTE — Telephone Encounter (Signed)
 Pt called in the morning and stated she was at the urgent care do to her Bp, she stated she was upset because no one had called her since Monday.  I spoke with Demetris, she had spoke to her yesterday and called in amlodipine  for the pt to pick up.  She stated that she did not go pick it up because she never rec'd a call from the pharmacy.  Pt stated that she thought that she was told losartan  was called in and so she took extra losartan .  She stated it did not help her BP go down so she went to the urgent care today. Pt stated something should be handled regarding the way this was handled and stated she as the urgent care right now and then hung up.

## 2023-02-17 NOTE — Telephone Encounter (Signed)
 Tried to call pt, LM2CB. What is going on. Cynthia Patton is at the hosp today.

## 2023-02-17 NOTE — ED Triage Notes (Signed)
 This started with heart attack in November, I wasn't able to do Cardiac Rehab Monday due to HTN (184/80 over something) episode and this cold I am currently having. I have followed up with the Cardiologist and spoke with their nurse, increased my medication. This morning I have had a Fever and cough with body aches (Day 4 of this).

## 2023-02-17 NOTE — ED Provider Notes (Signed)
 [facility]    CSN: [id number] Arrival date & time: 02/17/23  0801      History   Chief Complaint Chief Complaint  Patient presents with   Cough   Generalized Body Aches   Fever    HPI Cynthia Patton is a 63 y.o. female.   Here today for evaluation of congestion and cough that she has had for the last 4 days.  She reports she has had a fever as well with bodyaches.  She does not report any vomiting.  She has not taken over-the-counter medication for symptoms.  The history is provided by the patient.  Cough Associated symptoms: fever   Associated symptoms: no ear pain, no eye discharge, no shortness of breath, no sore throat and no wheezing   Fever Associated symptoms: congestion and cough   Associated symptoms: no diarrhea, no ear pain, no nausea, no sore throat and no vomiting     Past Medical History:  Diagnosis Date   Acute low back pain 03/25/2020   Anxiety    Cancer (HCC) 2000?   thyroid  cancer   Depression    Diabetes mellitus without complication (HCC)    Hyperlipidemia    Hypertension    Thyroid  disease     Patient Active Problem List   Diagnosis Date Noted   Other specified acquired deformities of left lower leg 02/17/2023   Severe obesity (BMI 35.0-39.9) with comorbidity (HCC) 01/24/2023   CAD (coronary artery disease) 11/14/2022   Tobacco abuse 11/14/2022   STEMI involving right coronary artery (HCC) 11/13/2022   Eczema 09/10/2021   Rash 06/30/2021   Renal cyst 04/30/2021   Tinnitus of both ears 04/09/2021   Moderate episode of recurrent major depressive disorder (HCC) 02/09/2021   History of COVID-19 10/09/2020   Cough 10/09/2020   Postviral fatigue syndrome 10/09/2020   Nonintractable headache 10/09/2020   Memory loss 10/09/2020   Postoperative hypothyroidism 07/28/2020   Thyroid  cancer (HCC) 04/23/2019   Hemoglobin A1C between 7% and 9% indicating borderline diabetic control (HCC) 10/18/2018   Positive ANA (antinuclear  antibody) 04/06/2016   Hyperlipidemia 03/19/2016   Controlled type 2 diabetes mellitus with complication, without long-term current use of insulin (HCC) 02/09/2016   Essential hypertension 02/09/2016   Health care maintenance 02/24/2015    Past Surgical History:  Procedure Laterality Date   ABDOMINAL HYSTERECTOMY     BREAST CYST EXCISION Left    CHOLECYSTECTOMY     CORONARY/GRAFT ACUTE MI REVASCULARIZATION N/A 11/13/2022   Procedure: Coronary/Graft Acute MI Revascularization;  Surgeon: Wonda Sharper, MD;  Location: [facility] INVASIVE CV LAB;  Service: Cardiovascular;  Laterality: N/A;   INCISE AND DRAIN ABCESS     on right leg    LEFT HEART CATH AND CORONARY ANGIOGRAPHY N/A 11/13/2022   Procedure: LEFT HEART CATH AND CORONARY ANGIOGRAPHY;  Surgeon: Wonda Sharper, MD;  Location: [facility] INVASIVE CV LAB;  Service: Cardiovascular;  Laterality: N/A;   THYROIDECTOMY      OB History     Gravida  0   Para  0   Term  0   Preterm  0   AB  0   Living  0      SAB  0   IAB  0   Ectopic  0   Multiple  0   Live Births  0            Home Medications    Prior to Admission medications   Medication Sig Start Date End Date Taking?  Authorizing Provider  aspirin  81 MG chewable tablet Chew 1 tablet (81 mg total) by mouth daily. 12/01/22  Yes Goodrich, Callie E, PA-C  atorvastatin  (LIPITOR) 80 MG tablet Take 1 tablet (80 mg total) by mouth daily. 11/15/22  Yes Goodrich, Callie E, PA-C  carvedilol  (COREG ) 6.25 MG tablet Take 1 tablet (6.25 mg total) by mouth 2 (two) times daily. 12/01/22  Yes Goodrich, Callie E, PA-C  clopidogrel  (PLAVIX ) 75 MG tablet Take 4 tablets for the first dose and then 1 tablet once daily 01/27/23  Yes Wonda Sharper, MD  fluticasone  (FLONASE ) 50 MCG/ACT nasal spray Place 2 sprays into both nostrils daily. 04/09/21  Yes Nichols, Tonya S, NP  levothyroxine  (SYNTHROID ) 175 MCG tablet Take 175 mcg by mouth daily before breakfast. 01/14/20  Yes [provider]   losartan  (COZAAR ) 100 MG tablet Take 1 tablet (100 mg total) by mouth daily. 11/23/22 02/21/23 Yes Goodrich, Callie E, PA-C  mometasone  (NASONEX ) 50 MCG/ACT nasal spray Place 2 sprays into the nose daily. 10/14/16  Yes Tilford Bertram HERO, FNP  nicotine  (NICODERM CQ  - DOSED IN MG/24 HOURS) 14 mg/24hr patch Place 14 mg onto the skin daily. 11/29/22  Yes [provider]  amLODipine  (NORVASC ) 5 MG tablet Take 1 tablet (5 mg total) by mouth daily. 02/16/23 03/19/23  Goodrich, Callie E, PA-C  blood glucose meter kit and supplies KIT Dispense based on patient and insurance preference. Use up to four times daily as directed. (FOR ICD-9 250.00, 250.01). 07/07/18   Vicenta Maduro, FNP  nitroGLYCERIN  (NITROSTAT ) 0.4 MG SL tablet Place 1 tablet (0.4 mg total) under the tongue every 5 (five) minutes as needed for chest pain. 11/14/22   Goodrich, Callie E, PA-C  ONETOUCH VERIO test strip USE UP TO 4 TIMES DAILY AS DIRECTED 10/22/18   [provider]    Family History Family History  Problem Relation Age of Onset   Kidney disease Mother    Hypertension Mother    Hypertension Sister    Heart disease Brother    Colon cancer Neg Hx    Colon polyps Neg Hx    Esophageal cancer Neg Hx    Rectal cancer Neg Hx    Stomach cancer Neg Hx     Social History Social History   Tobacco Use   Smoking status: Every Day    Current packs/day: 0.50    Types: Cigarettes   Smokeless tobacco: Never  Vaping Use   Vaping status: Never Used  Substance Use Topics   Alcohol use: No   Drug use: No     Allergies   Floxin [ocuflox], Ofloxacin, Oxycodone -acetaminophen , and Percocet [oxycodone -acetaminophen ]   Review of Systems Review of Systems  Constitutional:  Positive for fever.  HENT:  Positive for congestion. Negative for ear pain and sore throat.   Eyes:  Negative for discharge and redness.  Respiratory:  Positive for cough. Negative for shortness of breath and wheezing.   Gastrointestinal:   Negative for abdominal pain, diarrhea, nausea and vomiting.     Physical Exam Triage Vital Signs ED Triage Vitals  Encounter Vitals Group     BP 02/17/23 0820 (!) 177/85     Systolic BP Percentile --      Diastolic BP Percentile --      Pulse Rate 02/17/23 0820 75     Resp 02/17/23 0820 18     Temp 02/17/23 0820 98 F (36.7 C)     Temp Source 02/17/23 0820 Oral     SpO2 02/17/23  0820 98 %     Weight 02/17/23 0814 221 lb 9 oz (100.5 kg)     Height 02/17/23 0814 5' 3 (1.6 m)     Head Circumference --      Peak Flow --      Pain Score 02/17/23 0813 2     Pain Loc --      Pain Education --      Exclude from Growth Chart --    No data found.  Updated Vital Signs BP (!) 177/85 (BP Location: Left Arm)   Pulse 75   Temp 98 F (36.7 C) (Oral)   Resp 18   Ht 5' 3 (1.6 m)   Wt 221 lb 9 oz (100.5 kg)   SpO2 98%   BMI 39.25 kg/m   Visual Acuity Right Eye Distance:   Left Eye Distance:   Bilateral Distance:    Right Eye Near:   Left Eye Near:    Bilateral Near:     Physical Exam Vitals and nursing note reviewed.  Constitutional:      General: She is not in acute distress.    Appearance: Normal appearance. She is not ill-appearing.  HENT:     Head: Normocephalic and atraumatic.     Nose: Congestion present.     Mouth/Throat:     Mouth: Mucous membranes are moist.     Pharynx: No oropharyngeal exudate or posterior oropharyngeal erythema.  Eyes:     Conjunctiva/sclera: Conjunctivae normal.  Cardiovascular:     Rate and Rhythm: Normal rate and regular rhythm.     Heart sounds: Normal heart sounds. No murmur heard. Pulmonary:     Effort: Pulmonary effort is normal. No respiratory distress.     Breath sounds: Normal breath sounds. No wheezing, rhonchi or rales.  Skin:    General: Skin is warm and dry.  Neurological:     Mental Status: She is alert.  Psychiatric:        Mood and Affect: Mood normal.        Thought Content: Thought content normal.      UC  Treatments / Results  Labs (all labs ordered are listed, but only abnormal results are displayed) Labs Reviewed  POCT INFLUENZA A/B - Normal  SARS CORONAVIRUS 2 (TAT 6-24 HRS)    EKG   Radiology No results found.  Procedures Procedures (including critical care time)  Medications Ordered in UC Medications - No data to display  Initial Impression / Assessment and Plan / UC Course  I have reviewed the triage vital signs and the nursing notes.  Pertinent labs & imaging results that were available during my care of the patient were reviewed by me and considered in my medical decision making (see chart for details).    Flu test negative in office.  Suspect likely viral etiology of symptoms. Will screen for covid. Advised coricidin HBP if needed for symptoms relief and encouraged follow up if the symptoms do not improve or worsen. She does have upcoming appointment with cardiology.   Final Clinical Impressions(s) / UC Diagnoses   Final diagnoses:  Acute upper respiratory infection   Discharge Instructions   None    ED Prescriptions   None    PDMP not reviewed this encounter.   Billy Asberry FALCON, PA-C 02/17/23 [phone]

## 2023-02-17 NOTE — Telephone Encounter (Signed)
-----   Message from Callie E Goodrich sent at 02/17/2023  8:46 AM EST ----- Regarding: RE: Medications Rosaline,  Can you follow-up with this patient? I am scheduled to see her next Monday. I am in the hospital today - I will do my best to try to call her at some point but don't know when I will be able to do that based on how busy the hospital is. Just want to make sure there is not something urgent going on.  Thank you! Callie ----- Message ----- From: Lum Cordella NOVAK, CMA Sent: 02/17/2023   8:36 AM EST To: Callie E Goodrich, PA-C; # Subject: Medications                                    Good Morning Callie & Demetris,  My name is Redell with [facility]). I am messaging on behalf of the patient who is requesting call back by the end of the day today when possible to review the conversation's on 02-04 through 02-16-2024. If possible, she would like to speak with the provider.  She is currently in Urgent Care but should be available later today.  Redell BATTLE CMA [facility]

## 2023-02-18 ENCOUNTER — Ambulatory Visit (HOSPITAL_COMMUNITY): Payer: Managed Care, Other (non HMO)

## 2023-02-18 ENCOUNTER — Encounter (HOSPITAL_COMMUNITY): Payer: Commercial Managed Care - HMO

## 2023-02-18 LAB — SARS CORONAVIRUS 2 (TAT 6-24 HRS): SARS Coronavirus 2: NEGATIVE

## 2023-02-21 ENCOUNTER — Telehealth: Payer: Self-pay

## 2023-02-21 ENCOUNTER — Encounter (HOSPITAL_COMMUNITY): Payer: Medicaid Other

## 2023-02-21 ENCOUNTER — Ambulatory Visit: Payer: Commercial Managed Care - HMO | Attending: Student | Admitting: Student

## 2023-02-21 ENCOUNTER — Encounter: Payer: Self-pay | Admitting: Student

## 2023-02-21 ENCOUNTER — Ambulatory Visit (HOSPITAL_COMMUNITY): Payer: Managed Care, Other (non HMO)

## 2023-02-21 VITALS — BP 192/82 | HR 77 | Ht 63.0 in | Wt 223.4 lb

## 2023-02-21 DIAGNOSIS — I10 Essential (primary) hypertension: Secondary | ICD-10-CM | POA: Diagnosis not present

## 2023-02-21 DIAGNOSIS — F419 Anxiety disorder, unspecified: Secondary | ICD-10-CM

## 2023-02-21 DIAGNOSIS — E785 Hyperlipidemia, unspecified: Secondary | ICD-10-CM

## 2023-02-21 DIAGNOSIS — I252 Old myocardial infarction: Secondary | ICD-10-CM

## 2023-02-21 DIAGNOSIS — R0683 Snoring: Secondary | ICD-10-CM

## 2023-02-21 DIAGNOSIS — R4586 Emotional lability: Secondary | ICD-10-CM

## 2023-02-21 DIAGNOSIS — I2510 Atherosclerotic heart disease of native coronary artery without angina pectoris: Secondary | ICD-10-CM | POA: Diagnosis not present

## 2023-02-21 DIAGNOSIS — E118 Type 2 diabetes mellitus with unspecified complications: Secondary | ICD-10-CM

## 2023-02-21 MED ORDER — PRASUGREL HCL 10 MG PO TABS: 10.0000 mg | ORAL_TABLET | Freq: Every day | ORAL | 3 refills | Status: AC

## 2023-02-21 MED ORDER — CARVEDILOL 12.5 MG PO TABS: 12.5000 mg | ORAL_TABLET | Freq: Two times a day (BID) | ORAL | 3 refills | Status: DC

## 2023-02-21 MED ORDER — OLMESARTAN MEDOXOMIL 40 MG PO TABS: 40.0000 mg | ORAL_TABLET | Freq: Every day | ORAL | 6 refills | Status: DC

## 2023-02-21 NOTE — Telephone Encounter (Signed)
 Pt left appointment without paperwork. Left detailed message to come back to office for AVS paperwork and sleep monitor directions.

## 2023-02-21 NOTE — Patient Instructions (Addendum)
 Medication Instructions:  - Please go home and take you BP medications and Plavix  as soon as possible. - We are going to STOP your Plavix  and SWITCH you to Effient  (Prasugrel ). However, please DO NOT stop your Plavix  until you have the Effient . You should start Effient  24 hours after your last dose of Plavix . On day 1 of taking Effient , you will take 3 of the 10mg  tablets (total of 30mg ). Then on day 2, you can start the maintenance dose of 1 tablet (10mg ) once daily.  - INCREASE Carvedilol  (Coreg ) to 12.5mg  twice daily. - STOP Losartan  and START Olmesartan  40mg  daily instead. - We will STOP Amlodipine  so you do not need to pick this up from your Pharmacy.  *If you need a refill on your cardiac medications before your next appointment, please call your pharmacy*  Lab Work: LIPID AND CMET TODAY BMET IN 1 WEEK If you have labs (blood work) drawn today and your tests are completely normal, you will receive your results only by:  MyChart Message (if you have MyChart) OR A paper copy in the mail If you have any lab test that is abnormal or we need to change your treatment, we will call you to review the results.  Testing/Procedures: REFER TO [facility] CALL TO SCHEDULE AN APPOINTMENT [phone] NO REFERRAL IS NEEDED  Follow-Up: At [facility], you and your health needs are our priority.  As part of our continuing mission to provide you with exceptional heart care, we have created designated Provider Care Teams.  These Care Teams include your primary Cardiologist (physician) and Advanced Practice Providers (APPs -  Physician Assistants and Nurse Practitioners) who all work together to provide you with the care you need, when you need it.  Your next appointment:   1 month(s)  Provider:   ANY APP    Then, Callie Goodrich, PA-C will plan to see you in 3 month(s).  Other Instructions

## 2023-02-21 NOTE — Telephone Encounter (Signed)
 PT CAME BACK FOR AVS DETAILS

## 2023-02-23 ENCOUNTER — Ambulatory Visit (HOSPITAL_COMMUNITY): Payer: Managed Care, Other (non HMO)

## 2023-02-23 ENCOUNTER — Encounter (HOSPITAL_COMMUNITY): Payer: Commercial Managed Care - HMO

## 2023-02-23 ENCOUNTER — Telehealth (HOSPITAL_COMMUNITY): Payer: Self-pay | Admitting: *Deleted

## 2023-02-23 NOTE — Telephone Encounter (Signed)
I spoke with Vester advised not to return to exercise until her blood pressures are better controlled. Kynleigh said that she will continue to keep a BP log and call [facility] cardiology with readings. Will cancel appointments for Monday and Wed of next week.Thayer Headings RN BSN

## 2023-02-25 ENCOUNTER — Encounter (HOSPITAL_COMMUNITY): Payer: Commercial Managed Care - HMO

## 2023-02-25 ENCOUNTER — Ambulatory Visit (HOSPITAL_COMMUNITY): Payer: Managed Care, Other (non HMO)

## 2023-02-28 ENCOUNTER — Ambulatory Visit (HOSPITAL_COMMUNITY): Payer: Managed Care, Other (non HMO)

## 2023-02-28 ENCOUNTER — Encounter (HOSPITAL_COMMUNITY): Payer: Commercial Managed Care - HMO

## 2023-03-02 ENCOUNTER — Ambulatory Visit (HOSPITAL_COMMUNITY): Payer: Managed Care, Other (non HMO)

## 2023-03-02 ENCOUNTER — Encounter (HOSPITAL_COMMUNITY): Payer: Commercial Managed Care - HMO

## 2023-03-04 ENCOUNTER — Telehealth (HOSPITAL_COMMUNITY): Payer: Self-pay | Admitting: *Deleted

## 2023-03-04 ENCOUNTER — Ambulatory Visit
Admission: EM | Admit: 2023-03-04 | Discharge: 2023-03-04 | Disposition: A | Discharge status: Home / Self Care | Payer: Commercial Managed Care - HMO

## 2023-03-04 ENCOUNTER — Encounter (HOSPITAL_COMMUNITY): Admission: RE | Admit: 2023-03-04 | Payer: Commercial Managed Care - HMO | Source: Ambulatory Visit

## 2023-03-04 ENCOUNTER — Ambulatory Visit (HOSPITAL_COMMUNITY): Payer: Managed Care, Other (non HMO)

## 2023-03-04 DIAGNOSIS — L089 Local infection of the skin and subcutaneous tissue, unspecified: Secondary | ICD-10-CM

## 2023-03-04 DIAGNOSIS — L720 Epidermal cyst: Secondary | ICD-10-CM | POA: Diagnosis not present

## 2023-03-04 MED ORDER — DOXYCYCLINE HYCLATE 100 MG PO CAPS: 100.0000 mg | ORAL_CAPSULE | Freq: Two times a day (BID) | ORAL | 0 refills | Status: AC

## 2023-03-04 NOTE — ED Triage Notes (Signed)
Pt presents with a cyst located below the belly button but above the vagina. Pt states it started the size of a pen head and is now the size of a tennis ball. She first noticed it Tuesday. It is tender and painful to touch.

## 2023-03-04 NOTE — Telephone Encounter (Signed)
Cynthia Patton called to report that her blood pressures are still running high. Cynthia Patton said she had a cyst removed. Cynthia Patton says that her blood pressures are still running high at home. I asked Cynthia Patton to call Dr Earmon Phoenix office to let them know that her blood pressures are still running high. Will cancel Cynthia Patton's appointment's until she sees Cynthia Person NP on 03/22/23. Patient states understanding.Thayer Headings RN BSN

## 2023-03-04 NOTE — Discharge Instructions (Addendum)
Infected epidermal inclusion cyst that required incision and drainage today.  Instructions as follows: Leave the packing in place until tomorrow morning, then get in the shower and remove the packing.  You do not have to repack the area but you do need to keep a clean dry dressing over the area and change this twice daily.  Make sure to wash the area twice daily with soap and water. Doxycycline 100 mg twice daily for 7 days. Take this with food.  This is an antibiotic to treat infection. As this is a cyst, it will likely come back as a small knot in the area.  If this occurs then recommend following up with general surgery as it will need formal excision Return to urgent care or PCP if symptoms worsen or fail to resolve.

## 2023-03-04 NOTE — ED Provider Notes (Addendum)
[facility]    CSN: [id number] Arrival date & time: 03/04/23  0808      History   Chief Complaint Chief Complaint  Patient presents with   Cyst    HPI Cynthia Patton is a 63 y.o. female.   63 year old female who presents to urgent care with complaints of an infection in the lower abdominal wall.  She reports that this started on Tuesday with a small area that has gotten much larger and much more painful.  She denies fevers or chills.  She has never had this in the past.  She does have diabetes but feels that it has been relatively controlled.  She reports that the area is very painful now.     Past Medical History:  Diagnosis Date   Acute low back pain 03/25/2020   Anxiety    Cancer (HCC) 2000?   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/09/2021   Moderate episode of recurrent major depressive disorder (HCC) 02/09/2021   History of COVID-19 10/09/2020   Cough 10/09/2020   Postviral fatigue syndrome 10/09/2020   Nonintractable headache 10/09/2020   Memory loss 10/09/2020   Postoperative hypothyroidism 07/28/2020   Thyroid cancer (HCC) 04/23/2019   Hemoglobin A1C between 7% and 9% indicating borderline diabetic control (HCC) 10/18/2018   Positive ANA (antinuclear antibody) 04/06/2016   Hyperlipidemia 03/19/2016   Controlled type 2 diabetes mellitus with complication, without long-term current use of insulin (HCC) 02/09/2016   Essential hypertension 02/09/2016    Health care maintenance 02/24/2015    Past Surgical History:  Procedure Laterality Date   ABDOMINAL HYSTERECTOMY     BREAST CYST EXCISION Left    CHOLECYSTECTOMY     CORONARY/GRAFT ACUTE MI REVASCULARIZATION N/A 11/13/2022   Procedure: Coronary/Graft Acute MI Revascularization;  Surgeon: Tonny Bollman, MD;  Location: [facility] INVASIVE CV LAB;  Service: Cardiovascular;  Laterality: N/A;   INCISE AND DRAIN ABCESS     on right leg    LEFT HEART CATH AND CORONARY ANGIOGRAPHY N/A 11/13/2022   Procedure: LEFT HEART CATH AND CORONARY ANGIOGRAPHY;  Surgeon: Tonny Bollman, MD;  Location: [facility] INVASIVE CV LAB;  Service: Cardiovascular;  Laterality: N/A;   THYROIDECTOMY      OB History     Gravida  0   Para  0   Term  0   Preterm  0   AB  0   Living  0      SAB  0   IAB  0   Ectopic  0   Multiple  0   Live Births  0            Home Medications    Prior to Admission medications   Medication Sig Start Date End Date Taking? Authorizing Provider  amLODipine (NORVASC) 5 MG tablet Take 5 mg by mouth daily. 02/24/23  Yes [provider]  losartan (COZAAR) 100 MG tablet Take 100 mg by mouth daily. 02/23/23  Yes  [provider]  aspirin 81 MG chewable tablet Chew 1 tablet (81 mg total) by mouth daily. 12/01/22   Marjie Skiff E, PA-C  atorvastatin (LIPITOR) 80 MG tablet Take 1 tablet (80 mg total) by mouth daily. 11/15/22   Marjie Skiff E, PA-C  blood glucose meter kit and supplies KIT Dispense based on patient and insurance preference. Use up to four times daily as directed. (FOR ICD-9 250.00, 250.01). 07/07/18   Mike Gip, FNP  carvedilol (COREG) 12.5 MG tablet Take 1 tablet (12.5 mg total) by mouth 2 (two) times daily. 02/21/23   Corrin Parker, PA-C  clopidogrel (PLAVIX) 75 MG tablet Take 4 tablets for the first dose and then 1 tablet once daily 01/27/23   Tonny Bollman, MD  fluticasone [facility]) 50 MCG/ACT nasal spray Place 2 sprays into both  nostrils daily. 04/09/21   Ivonne Andrew, NP  levothyroxine (SYNTHROID) 175 MCG tablet Take 175 mcg by mouth daily before breakfast. 01/14/20   [provider]  mometasone (NASONEX) 50 MCG/ACT nasal spray Place 2 sprays into the nose daily. 10/14/16   Massie Maroon, FNP  nicotine (NICODERM CQ - DOSED IN MG/24 HOURS) 14 mg/24hr patch Place 14 mg onto the skin daily. 11/29/22   [provider]  nitroGLYCERIN (NITROSTAT) 0.4 MG SL tablet Place 1 tablet (0.4 mg total) under the tongue every 5 (five) minutes as needed for chest pain. 11/14/22   Marjie Skiff E, PA-C  olmesartan (BENICAR) 40 MG tablet Take 1 tablet (40 mg total) by mouth daily. 02/21/23   Marjie Skiff E, PA-C  ONETOUCH VERIO test strip USE UP TO 4 TIMES DAILY AS DIRECTED 10/22/18   [provider]  prasugrel (EFFIENT) 10 MG TABS tablet Take 1 tablet (10 mg total) by mouth daily. TAKE 30MG  1ST DOSE THEN 10MG  THEREAFTER 02/21/23 05/22/23  Corrin Parker, PA-C    Family History Family History  Problem Relation Age of Onset   Kidney disease Mother    Hypertension Mother    Hypertension Sister    Heart disease Brother    Colon cancer Neg Hx    Colon polyps Neg Hx    Esophageal cancer Neg Hx    Rectal cancer Neg Hx    Stomach cancer Neg Hx     Social History Social History   Tobacco Use   Smoking status: Every Day    Current packs/day: 0.50    Types: Cigarettes   Smokeless tobacco: Never  Vaping Use   Vaping status: Never Used  Substance Use Topics   Alcohol use: No   Drug use: No     Allergies   Floxin [ocuflox], Ofloxacin, Oxycodone-acetaminophen, and Percocet [oxycodone-acetaminophen]   Review of Systems Review of Systems  Constitutional:  Negative for chills and fever.  HENT:  Negative for ear pain and sore throat.   Eyes:  Negative for pain and visual disturbance.  Respiratory:  Negative for cough and shortness of breath.   Cardiovascular:  Negative for chest pain and  palpitations.  Gastrointestinal:  Negative for abdominal pain and vomiting.  Genitourinary:  Negative for dysuria and hematuria.  Musculoskeletal:  Negative for arthralgias and back pain.  Skin:  Positive for wound. Negative for color change and rash.  Neurological:  Negative for seizures and syncope.  All other systems reviewed and are negative.    Physical Exam Triage Vital Signs ED Triage Vitals  Encounter Vitals Group     BP 03/04/23 0944 (!) 175/81  Systolic BP Percentile --      Diastolic BP Percentile --      Pulse Rate 03/04/23 0944 67     Resp 03/04/23 0944 18     Temp 03/04/23 0944 98.7 F (37.1 C)     Temp Source 03/04/23 0944 Oral     SpO2 03/04/23 0944 96 %     Weight --      Height --      Head Circumference --      Peak Flow --      Pain Score 03/04/23 0941 6     Pain Loc --      Pain Education --      Exclude from Growth Chart --    No data found.  Updated Vital Signs BP (!) 175/81 (BP Location: Right Arm)   Pulse 67   Temp 98.7 F (37.1 C) (Oral)   Resp 18   SpO2 96%   Visual Acuity Right Eye Distance:   Left Eye Distance:   Bilateral Distance:    Right Eye Near:   Left Eye Near:    Bilateral Near:     Physical Exam Vitals and nursing note reviewed.  Constitutional:      General: She is not in acute distress.    Appearance: She is well-developed.  HENT:     Head: Normocephalic and atraumatic.  Eyes:     Conjunctiva/sclera: Conjunctivae normal.  Cardiovascular:     Rate and Rhythm: Normal rate and regular rhythm.     Heart sounds: No murmur heard. Pulmonary:     Effort: Pulmonary effort is normal. No respiratory distress.     Breath sounds: Normal breath sounds.  Abdominal:     Palpations: Abdomen is soft.     Tenderness: There is no abdominal tenderness.       Comments: Large area of induration with 2 to 3 cm area of fluctuance in the center with open pore draining cystic material.  Musculoskeletal:        General: No  swelling.     Cervical back: Neck supple.  Skin:    General: Skin is warm and dry.     Capillary Refill: Capillary refill takes less than 2 seconds.  Neurological:     Mental Status: She is alert.  Psychiatric:        Mood and Affect: Mood normal.      UC Treatments / Results  Labs (all labs ordered are listed, but only abnormal results are displayed) Labs Reviewed - No data to display  EKG   Radiology No results found.  Procedures Incision and Drainage  Date/Time: 03/04/2023 10:37 AM  Performed by: Landis Martins, PA-C Authorized by: Landis Martins, PA-C   Consent:    Consent obtained:  Verbal   Consent given by:  Patient   Risks discussed:  Bleeding, incomplete drainage, pain and damage to other organs   Alternatives discussed:  No treatment Universal protocol:    Procedure explained and questions answered to patient or proxy's satisfaction: yes     Relevant documents present and verified: yes     Test results available : yes     Imaging studies available: yes     Required blood products, implants, devices, and special equipment available: yes     Site/side marked: yes     Immediately prior to procedure, a time out was called: yes     Patient identity confirmed:  Verbally with patient Location:    Type:  Abscess   Location:  Trunk   Trunk location:  Abdomen (lower mid abdomen) Pre-procedure details:    Skin preparation:  Betadine Anesthesia:    Anesthesia method:  Local infiltration   Local anesthetic:  Lidocaine 1% WITH epi Procedure type:    Complexity:  Complex Procedure details:    Incision types:  Elliptical   Incision depth:  Subcutaneous   Wound management:  Probed and deloculated, irrigated with saline and extensive cleaning   Drainage:  Purulent   Drainage amount:  Copious   Packing materials:  1/4 in iodoform gauze Post-procedure details:    Procedure completion:  Tolerated well, no immediate complications  (including critical care  time)  Medications Ordered in UC Medications - No data to display  Initial Impression / Assessment and Plan / UC Course  I have reviewed the triage vital signs and the nursing notes.  Pertinent labs & imaging results that were available during my care of the patient were reviewed by me and considered in my medical decision making (see chart for details).     Infected epithelial inclusion cyst   Infected epidermal inclusion cyst that required incision and drainage today.  Instructions as follows: Leave the packing in place until tomorrow morning, then get in the shower and remove the packing.  You do not have to repack the area but you do need to keep a clean dry dressing over the area and change this twice daily.  Make sure to wash the area twice daily with soap and water. Doxycycline 100 mg twice daily for 7 days. Take this with food.  This is an antibiotic to treat infection. As this is a cyst, it will likely come back as a small knot in the area.  If this occurs then recommend following up with general surgery as it will need formal excision Make appointment with primary care physician for follow-up of other chronic medical conditions. Return to urgent care or PCP if symptoms worsen or fail to resolve.    Final Clinical Impressions(s) / UC Diagnoses   Final diagnoses:  Infected epithelial inclusion cyst     Discharge Instructions      Infected epidermal inclusion cyst that required incision and drainage today.  Instructions as follows: Leave the packing in place until tomorrow morning, then get in the shower and remove the packing.  You do not have to repack the area but you do need to keep a clean dry dressing over the area and change this twice daily.  Make sure to wash the area twice daily with soap and water. Doxycycline 100 mg twice daily for 7 days. Take this with food.  This is an antibiotic to treat infection. As this is a cyst, it will likely come back as a small knot in  the area.  If this occurs then recommend following up with general surgery as it will need formal excision Return to urgent care or PCP if symptoms worsen or fail to resolve.     ED Prescriptions   None    PDMP not reviewed this encounter.   Landis Martins, PA-C 03/04/23 1037    Landis Martins, [place] 03/04/23 1038

## 2023-03-07 ENCOUNTER — Ambulatory Visit (HOSPITAL_COMMUNITY): Payer: Managed Care, Other (non HMO)

## 2023-03-07 ENCOUNTER — Encounter (HOSPITAL_COMMUNITY): Payer: Commercial Managed Care - HMO

## 2023-03-07 ENCOUNTER — Ambulatory Visit
Admission: EM | Admit: 2023-03-07 | Discharge: 2023-03-07 | Disposition: A | Discharge status: Home / Self Care | Payer: Commercial Managed Care - HMO

## 2023-03-07 DIAGNOSIS — L089 Local infection of the skin and subcutaneous tissue, unspecified: Secondary | ICD-10-CM

## 2023-03-07 DIAGNOSIS — L720 Epidermal cyst: Secondary | ICD-10-CM | POA: Diagnosis not present

## 2023-03-07 NOTE — ED Triage Notes (Addendum)
 Patient presents to [facility] for wound check. States fever, stinging sensation since yesterday and increased drainage. States uncomfortable with sitting. Has been taking her antibiotics. States she had packing in wound on Friday and she changed dressing yesterday.

## 2023-03-07 NOTE — ED Provider Notes (Signed)
 [facility]    CSN: [id number] Arrival date & time: 03/07/23  1128      History   Chief Complaint No chief complaint on file.   HPI Cynthia Patton is a 62 y.o. female.   Patient here today for wound check.  She had incision and drainage of suspected infected epidermoid cyst to her lower abdomen a few days ago.  She reports that she did remove packing as instructed and has had drainage from same.  She notes that swelling and pain is improved from prior.  She does not report fever.  She is concerned because she cannot visualize area very well due to location.     Past Medical History:  Diagnosis Date   Acute low back pain 03/25/2020   Anxiety    Cancer (HCC) 2000?   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/09/2021   Moderate episode of recurrent major depressive disorder (HCC) 02/09/2021   History of COVID-19 10/09/2020   Cough 10/09/2020   Postviral fatigue syndrome 10/09/2020   Nonintractable headache 10/09/2020   Memory loss 10/09/2020   Postoperative hypothyroidism 07/28/2020   Thyroid cancer (HCC) 04/23/2019   Hemoglobin A1C between 7% and 9% indicating borderline diabetic control (HCC) 10/18/2018   Positive ANA (antinuclear antibody) 04/06/2016   Hyperlipidemia 03/19/2016   Controlled type 2 diabetes mellitus with complication, without long-term current use of insulin (HCC) 02/09/2016   Essential hypertension 02/09/2016   Health care maintenance 02/24/2015     Past Surgical History:  Procedure Laterality Date   ABDOMINAL HYSTERECTOMY     BREAST CYST EXCISION Left    CHOLECYSTECTOMY     CORONARY/GRAFT ACUTE MI REVASCULARIZATION N/A 11/13/2022   Procedure: Coronary/Graft Acute MI Revascularization;  Surgeon: Tonny Bollman, MD;  Location: [facility] INVASIVE CV LAB;  Service: Cardiovascular;  Laterality: N/A;   INCISE AND DRAIN ABCESS     on right leg    LEFT HEART CATH AND CORONARY ANGIOGRAPHY N/A 11/13/2022   Procedure: LEFT HEART CATH AND CORONARY ANGIOGRAPHY;  Surgeon: Tonny Bollman, MD;  Location: [facility] INVASIVE CV LAB;  Service: Cardiovascular;  Laterality: N/A;   THYROIDECTOMY      OB History     Gravida  0   Para  0   Term  0   Preterm  0   AB  0   Living  0      SAB  0   IAB  0   Ectopic  0   Multiple  0   Live Births  0            Home Medications    Prior to Admission medications   Medication Sig Start Date End Date Taking? Authorizing Provider  amLODipine (NORVASC) 5 MG tablet Take 5 mg by mouth daily. 02/24/23   [provider]  aspirin 81 MG chewable tablet Chew 1 tablet (81 mg total) by mouth daily. 12/01/22   Marjie Skiff E, PA-C  atorvastatin (  LIPITOR) 80 MG tablet Take 1 tablet (80 mg total) by mouth daily. 11/15/22   Marjie Skiff E, PA-C  blood glucose meter kit and supplies KIT Dispense based on patient and insurance preference. Use up to four times daily as directed. (FOR ICD-9 250.00, 250.01). 07/07/18   Mike Gip, FNP  carvedilol (COREG) 12.5 MG tablet Take 1 tablet (12.5 mg total) by mouth 2 (two) times daily. 02/21/23   Corrin Parker, PA-C  clopidogrel (PLAVIX) 75 MG tablet Take 4 tablets for the first dose and then 1 tablet once daily 01/27/23   Tonny Bollman, MD  doxycycline (VIBRAMYCIN) 100 MG capsule Take 1 capsule (100 mg total) by mouth 2 (two) times daily for 7 days. 03/04/23 03/11/23  Doristine Mango A, PA-C  fluticasone (FLONASE) 50 MCG/ACT nasal spray Place 2  sprays into both nostrils daily. 04/09/21   Ivonne Andrew, NP  levothyroxine (SYNTHROID) 175 MCG tablet Take 175 mcg by mouth daily before breakfast. 01/14/20   [provider]  losartan (COZAAR) 100 MG tablet Take 100 mg by mouth daily. 02/23/23   [provider]  mometasone (NASONEX) 50 MCG/ACT nasal spray Place 2 sprays into the nose daily. 10/14/16   Massie Maroon, FNP  nicotine (NICODERM CQ - DOSED IN MG/24 HOURS) 14 mg/24hr patch Place 14 mg onto the skin daily. 11/29/22   [provider]  nitroGLYCERIN (NITROSTAT) 0.4 MG SL tablet Place 1 tablet (0.4 mg total) under the tongue every 5 (five) minutes as needed for chest pain. 11/14/22   Marjie Skiff E, PA-C  olmesartan (BENICAR) 40 MG tablet Take 1 tablet (40 mg total) by mouth daily. 02/21/23   Marjie Skiff E, PA-C  ONETOUCH VERIO test strip USE UP TO 4 TIMES DAILY AS DIRECTED 10/22/18   [provider]  prasugrel (EFFIENT) 10 MG TABS tablet Take 1 tablet (10 mg total) by mouth daily. TAKE 30MG  1ST DOSE THEN 10MG  THEREAFTER 02/21/23 05/22/23  Corrin Parker, PA-C    Family History Family History  Problem Relation Age of Onset   Kidney disease Mother    Hypertension Mother    Hypertension Sister    Heart disease Brother    Colon cancer Neg Hx    Colon polyps Neg Hx    Esophageal cancer Neg Hx    Rectal cancer Neg Hx    Stomach cancer Neg Hx     Social History Social History   Tobacco Use   Smoking status: Every Day    Current packs/day: 0.50    Types: Cigarettes   Smokeless tobacco: Never  Vaping Use   Vaping status: Never Used  Substance Use Topics   Alcohol use: No   Drug use: No     Allergies   Floxin [ocuflox], Ofloxacin, Oxycodone-acetaminophen, and Percocet [oxycodone-acetaminophen]   Review of Systems Review of Systems  Constitutional:  Negative for chills and fever.  Eyes:  Negative for discharge and redness.  Gastrointestinal:  Negative for abdominal pain,  nausea and vomiting.  Skin:  Positive for wound. Negative for color change.     Physical Exam Triage Vital Signs ED Triage Vitals  Encounter Vitals Group     BP 03/07/23 1210 (!) 182/94     Systolic BP Percentile --      Diastolic BP Percentile --      Pulse Rate 03/07/23 1210 71     Resp 03/07/23 1210 18     Temp 03/07/23 1210 97.6 F (36.4 C)  Temp Source 03/07/23 1210 Oral     SpO2 03/07/23 1210 98 %     Weight --      Height --      Head Circumference --      Peak Flow --      Pain Score 03/07/23 1207 1     Pain Loc --      Pain Education --      Exclude from Growth Chart --    No data found.  Updated Vital Signs BP (!) 164/83 (BP Location: Right Arm)   Pulse 71   Temp 97.6 F (36.4 C) (Oral)   Resp 18   SpO2 98%   Visual Acuity Right Eye Distance:   Left Eye Distance:   Bilateral Distance:    Right Eye Near:   Left Eye Near:    Bilateral Near:     Physical Exam Vitals and nursing note reviewed.  Constitutional:      General: She is not in acute distress.    Appearance: Normal appearance. She is not ill-appearing.  HENT:     Head: Normocephalic and atraumatic.  Eyes:     Conjunctiva/sclera: Conjunctivae normal.  Cardiovascular:     Rate and Rhythm: Normal rate.  Pulmonary:     Effort: Pulmonary effort is normal. No respiratory distress.  Skin:    Comments: Approx 1 cm wound to lower abdomen with minimal purulent drainage, mild surrounding induration, mild TTP.   Neurological:     Mental Status: She is alert.  Psychiatric:        Mood and Affect: Mood normal.        Behavior: Behavior normal.        Thought Content: Thought content normal.      UC Treatments / Results  Labs (all labs ordered are listed, but only abnormal results are displayed) Labs Reviewed - No data to display  EKG   Radiology No results found.  Procedures Procedures (including critical care time)  Medications Ordered in UC Medications - No data to  display  Initial Impression / Assessment and Plan / UC Course  I have reviewed the triage vital signs and the nursing notes.  Pertinent labs & imaging results that were available during my care of the patient were reviewed by me and considered in my medical decision making (see chart for details).    Recommended she continue antibitoic and continue to monitor for improving symptoms. Advised follow up immediately with any worsening. Wound redressed in office. Advised we are happy to assist with wound dressing, monitoring if patient desires.   Final Clinical Impressions(s) / UC Diagnoses   Final diagnoses:  Infected epithelial inclusion cyst   Discharge Instructions   None    ED Prescriptions   None    PDMP not reviewed this encounter.   Tomi Bamberger, PA-C 03/07/23 1240

## 2023-03-09 ENCOUNTER — Ambulatory Visit (HOSPITAL_COMMUNITY): Payer: Managed Care, Other (non HMO)

## 2023-03-09 ENCOUNTER — Encounter (HOSPITAL_COMMUNITY): Payer: Medicaid Other

## 2023-03-11 ENCOUNTER — Ambulatory Visit (HOSPITAL_COMMUNITY): Payer: Managed Care, Other (non HMO)

## 2023-03-11 ENCOUNTER — Encounter (HOSPITAL_COMMUNITY): Payer: Commercial Managed Care - HMO

## 2023-03-14 ENCOUNTER — Ambulatory Visit (HOSPITAL_COMMUNITY): Payer: Managed Care, Other (non HMO)

## 2023-03-14 ENCOUNTER — Encounter (HOSPITAL_COMMUNITY): Payer: Commercial Managed Care - HMO

## 2023-03-15 ENCOUNTER — Encounter (HOSPITAL_COMMUNITY): Payer: Self-pay | Admitting: *Deleted

## 2023-03-15 DIAGNOSIS — Z955 Presence of coronary angioplasty implant and graft: Secondary | ICD-10-CM

## 2023-03-15 NOTE — Progress Notes (Signed)
 Cardiac Individual Treatment Plan  Patient Details  Name: Cynthia Patton MRN: [id number] Date of Birth: [birth year] Referring Provider:   Flowsheet Row CARDIAC REHAB PHASE II ORIENTATION from 12/27/2022 in [facility]  Referring Provider Tonny Bollman, MD       Initial Encounter Date:  Flowsheet Row CARDIAC REHAB PHASE II ORIENTATION from 12/27/2022 in [facility]  Date 12/27/22       Visit Diagnosis: 11/13/22 DES RCA  Patient's Home Medications on Admission:  Current Outpatient Medications:    amLODipine (NORVASC) 5 MG tablet, Take 5 mg by mouth daily., Disp: , Rfl:    aspirin 81 MG chewable tablet, Chew 1 tablet (81 mg total) by mouth daily., Disp: 90 tablet, Rfl: 3   atorvastatin (LIPITOR) 80 MG tablet, Take 1 tablet (80 mg total) by mouth daily., Disp: 30 tablet, Rfl: 2   blood glucose meter kit and supplies KIT, Dispense based on patient and insurance preference. Use up to four times daily as directed. (FOR ICD-9 250.00, 250.01)., Disp: 1 each, Rfl: 0   carvedilol (COREG) 12.5 MG tablet, Take 1 tablet (12.5 mg total) by mouth 2 (two) times daily., Disp: 60 tablet, Rfl: 3   clopidogrel (PLAVIX) 75 MG tablet, Take 4 tablets for the first dose and then 1 tablet once daily, Disp: 94 tablet, Rfl: 3   fluticasone (FLONASE) 50 MCG/ACT nasal spray, Place 2 sprays into both nostrils daily., Disp: 16 g, Rfl: 6   levothyroxine (SYNTHROID) 175 MCG tablet, Take 175 mcg by mouth daily before breakfast., Disp: , Rfl:    losartan (COZAAR) 100 MG tablet, Take 100 mg by mouth daily., Disp: , Rfl:    mometasone (NASONEX) 50 MCG/ACT nasal spray, Place 2 sprays into the nose daily., Disp: 17 g, Rfl: 1   nicotine (NICODERM CQ - DOSED IN MG/24 HOURS) 14 mg/24hr patch, Place 14 mg onto the skin daily., Disp: , Rfl:    nitroGLYCERIN (NITROSTAT) 0.4 MG SL tablet, Place 1 tablet (0.4 mg  total) under the tongue every 5 (five) minutes as needed for chest pain., Disp: 25 tablet, Rfl: 2   olmesartan (BENICAR) 40 MG tablet, Take 1 tablet (40 mg total) by mouth daily., Disp: 30 tablet, Rfl: 6   ONETOUCH VERIO test strip, USE UP TO 4 TIMES DAILY AS DIRECTED, Disp: , Rfl:    prasugrel (EFFIENT) 10 MG TABS tablet, Take 1 tablet (10 mg total) by mouth daily. TAKE 30MG  1ST DOSE THEN 10MG  THEREAFTER, Disp: 33 tablet, Rfl: 3  Past Medical History: Past Medical History:  Diagnosis Date   Acute low back pain 03/25/2020   Anxiety    Cancer (HCC) 2000?   thyroid cancer   Depression    Diabetes mellitus without complication (HCC)    Hyperlipidemia    Hypertension    Thyroid disease     Tobacco Use: Social History   Tobacco Use  Smoking Status Every Day   Current packs/day: 0.50   Types: Cigarettes  Smokeless Tobacco Never    Labs: Review Flowsheet  More data exists      Latest Ref Rng & Units 07/27/2019 11/01/2019 02/08/2020 02/09/2021 11/13/2022  Labs for ITP Cardiac and Pulmonary Rehab  Cholestrol 0 - 200 mg/dL 409  - 811  914  782   LDL (calc) 0 - 99 mg/dL 956  - 213  086  578   HDL-C >40 mg/dL 37  -  44  47  41   Trlycerides <150 mg/dL 161  - 97  096  045   Hemoglobin A1c 4.8 - 5.6 % - 6.3  6.2  6.2  6.2  6.2  6.8  6.8  6.8  6.8  6.8     Details       Multiple values from one day are sorted in reverse-chronological order         Capillary Blood Glucose: Lab Results  Component Value Date   GLUCAP 91 02/14/2023   GLUCAP 122 (H) 01/14/2023   GLUCAP 111 (H) 01/10/2023   GLUCAP 90 01/10/2023   GLUCAP 126 (H) 11/13/2022     Exercise Target Goals: Exercise Program Goal: Individual exercise prescription set using results from initial 6 min walk test and THRR while considering  patient's activity barriers and safety.   Exercise Prescription Goal: Initial exercise prescription builds to 30-45 minutes a day of aerobic activity, 2-3 days per week.  Home exercise  guidelines will be given to patient during program as part of exercise prescription that the participant will acknowledge.  Activity Barriers & Risk Stratification:   6 Minute Walk:   Oxygen Initial Assessment:   Oxygen Re-Evaluation:   Oxygen Discharge (Final Oxygen Re-Evaluation):   Initial Exercise Prescription:   Perform Capillary Blood Glucose checks as needed.  Exercise Prescription Changes:   Exercise Comments:   Exercise Comments     Row Name 01/24/23 1401 03/09/23 1642         Exercise Comments Pt absent today. Will review education when she is able to attend on a more regular basis. Pt absent since 01/14/23 [place] had cyst removed 2/21. Will review education when patient cleared to return               Exercise Goals and Review:   Exercise Goals Re-Evaluation :   Discharge Exercise Prescription (Final Exercise Prescription Changes):   Nutrition:  Target Goals: Understanding of nutrition guidelines, daily intake of sodium 1500mg , cholesterol 200mg , calories 30% from fat and 7% or less from saturated fats, daily to have 5 or more servings of fruits and vegetables.  Biometrics:    Nutrition Therapy Plan and Nutrition Goals:   Nutrition Assessments:  MEDIFICTS Score Key: >=70 Need to make dietary changes  40-70 Heart Healthy Diet <= 40 Therapeutic Level Cholesterol Diet   Flowsheet Row CARDIAC REHAB PHASE II EXERCISE from 01/10/2023 in [facility]  Picture Your Plate Total Score on Admission 53      Picture Your Plate Scores: <40 Unhealthy dietary pattern with much room for improvement. 41-50 Dietary pattern unlikely to meet recommendations for good health and room for improvement. 51-60 More healthful dietary pattern, with some room for improvement.  >60 Healthy dietary pattern, although there may be some specific behaviors that could be improved.    Nutrition Goals  Re-Evaluation:   Nutrition Goals Re-Evaluation:   Nutrition Goals Discharge (Final Nutrition Goals Re-Evaluation):   Psychosocial: Target Goals: Acknowledge presence or absence of significant depression and/or stress, maximize coping skills, provide positive support system. Participant is able to verbalize types and ability to use techniques and skills needed for reducing stress and depression.  Initial Review & Psychosocial Screening:   Quality of Life Scores:  Scores of 19 and below usually indicate a poorer quality of life in these areas.  A difference of  2-3 points is a clinically meaningful difference.  A difference of 2-3 points in  the total score of the Quality of Life Index has been associated with significant improvement in overall quality of life, self-image, physical symptoms, and general health in studies assessing change in quality of life.  PHQ-9: Review Flowsheet  More data exists      12/27/2022 09/10/2021 06/30/2021 04/09/2021 02/09/2021  Depression screen PHQ 2/9  Decreased Interest 0 1 0 0 1  Down, Depressed, Hopeless 1 1 1  0 1  PHQ - 2 Score 1 2 1  0 2  Altered sleeping 0 1 1 0 0  Tired, decreased energy 2 1 1  0 0  Change in appetite 0 - 1 - 1  Feeling bad or failure about yourself  0 1 0 0 0  Trouble concentrating 0 0 0 0 0  Moving slowly or fidgety/restless 0 0 0 0 0  Suicidal thoughts 0 0 0 0 0  PHQ-9 Score 3 5 4  0 3  Difficult doing work/chores Somewhat difficult - Not difficult at all - -   Interpretation of Total Score  Total Score Depression Severity:  1-4 = Minimal depression, 5-9 = Mild depression, 10-14 = Moderate depression, 15-19 = Moderately severe depression, 20-27 = Severe depression   Psychosocial Evaluation and Intervention:   Psychosocial Re-Evaluation:  Psychosocial Re-Evaluation     Row Name 02/11/23 1143 03/15/23 1421           Psychosocial Re-Evaluation   Current issues with Current Anxiety/Panic;Current Stress  Concerns;Current Depression Current Anxiety/Panic;Current Stress Concerns;Current Depression      Comments Will review quality of life and PHQ2-9 when [name] returns to exercise at cardiac rehab. Will review quality of life and PHQ2-9 when Ellsie returns to exercise at cardiac rehab. Jaylissa's exercise remains on hold.      Expected Outcomes Kesi will have controlled or decreased depression, anxiety/ panic upon compleition of cardiac rehab Disa will have controlled or decreased depression, anxiety/ panic upon compleition of cardiac rehab      Interventions Stress management education;Encouraged to attend Cardiac Rehabilitation for the exercise;Relaxation education Stress management education;Encouraged to attend Cardiac Rehabilitation for the exercise;Relaxation education      Continue Psychosocial Services  Follow up required by staff Follow up required by staff        Initial Review   Source of Stress Concerns Chronic Illness;Family;Occupation Chronic Illness;Family;Occupation      Comments Will continue to monitor and offer support as needed. Will continue to monitor and offer support as needed.               Psychosocial Discharge (Final Psychosocial Re-Evaluation):  Psychosocial Re-Evaluation - 03/15/23 1421       Psychosocial Re-Evaluation   Current issues with Current Anxiety/Panic;Current Stress Concerns;Current Depression    Comments Will review quality of life and PHQ2-9 when Enna returns to exercise at cardiac rehab. Kenneth's exercise remains on hold.    Expected Outcomes Jylian will have controlled or decreased depression, anxiety/ panic upon compleition of cardiac rehab    Interventions Stress management education;Encouraged to attend Cardiac Rehabilitation for the exercise;Relaxation education    Continue Psychosocial Services  Follow up required by staff      Initial Review   Source of Stress Concerns Chronic Illness;Family;Occupation    Comments Will continue to monitor and offer  support as needed.             Vocational Rehabilitation: Provide vocational rehab assistance to qualifying candidates.   Vocational Rehab Evaluation & Intervention:   Education: Education Goals: Education classes will be provided  on a weekly basis, covering required topics. Participant will state understanding/return demonstration of topics presented.    Education     Row Name 01/14/23 1400     Education   Cardiac Education Topics Pritikin   Licensed conveyancer Nutrition   Nutrition Dining Out - Part 1   Instruction Review Code 1- Verbalizes Understanding   Class Start Time 1358   Class Stop Time 1442   Class Time Calculation (min) 44 min    Row Name 02/14/23 1600     Education   Cardiac Education Topics Pritikin   Geographical information systems officer Psychosocial   Psychosocial Workshop From Head to Heart: The Power of a Healthy Outlook   Instruction Review Code 1- Verbalizes Understanding   Class Start Time 1400   Class Stop Time 1446   Class Time Calculation (min) 46 min            Core Videos: Exercise    Move It!  Clinical staff conducted group or individual video education with verbal and written material and guidebook.  Patient learns the recommended Pritikin exercise program. Exercise with the goal of living a long, healthy life. Some of the health benefits of exercise include controlled diabetes, healthier blood pressure levels, improved cholesterol levels, improved heart and lung capacity, improved sleep, and better body composition. Everyone should speak with their doctor before starting or changing an exercise routine.  Biomechanical Limitations Clinical staff conducted group or individual video education with verbal and written material and guidebook.  Patient learns how biomechanical limitations can impact exercise and how we can mitigate and possibly overcome  limitations to have an impactful and balanced exercise routine.  Body Composition Clinical staff conducted group or individual video education with verbal and written material and guidebook.  Patient learns that body composition (ratio of muscle mass to fat mass) is a key component to assessing overall fitness, rather than body weight alone. Increased fat mass, especially visceral belly fat, can put [place] at increased risk for metabolic syndrome, type 2 diabetes, heart disease, and even death. It is recommended to combine diet and exercise (cardiovascular and resistance training) to improve your body composition. Seek guidance from your physician and exercise physiologist before implementing an exercise routine.  Exercise Action Plan Clinical staff conducted group or individual video education with verbal and written material and guidebook.  Patient learns the recommended strategies to achieve and enjoy long-term exercise adherence, including variety, self-motivation, self-efficacy, and positive decision making. Benefits of exercise include fitness, good health, weight management, more energy, better sleep, less stress, and overall well-being.  Medical   Heart Disease Risk Reduction Clinical staff conducted group or individual video education with verbal and written material and guidebook.  Patient learns our heart is our most vital organ as it circulates oxygen, nutrients, white blood cells, and hormones throughout the entire body, and carries waste away. Data supports a plant-based eating plan like the Pritikin Program for its effectiveness in slowing progression of and reversing heart disease. The video provides a number of recommendations to address heart disease.   Metabolic Syndrome and Belly Fat  Clinical staff conducted group or individual video education with verbal and written material and guidebook.  Patient learns what metabolic syndrome is, how it leads to heart disease, and how one can  reverse it and keep it from coming back. You have  metabolic syndrome if you have 3 of the following 5 criteria: abdominal obesity, high blood pressure, high triglycerides, low HDL cholesterol, and high blood sugar.  Hypertension and Heart Disease Clinical staff conducted group or individual video education with verbal and written material and guidebook.  Patient learns that high blood pressure, or hypertension, is very common in the [place]. Hypertension is largely due to excessive salt intake, but other important risk factors include being overweight, physical inactivity, drinking too much alcohol, smoking, and not eating enough potassium from fruits and vegetables. High blood pressure is a leading risk factor for heart attack, stroke, congestive heart failure, dementia, kidney failure, and premature death. Long-term effects of excessive salt intake include stiffening of the arteries and thickening of heart muscle and organ damage. Recommendations include ways to reduce hypertension and the risk of heart disease.  Diseases of Our Time - Focusing on Diabetes Clinical staff conducted group or individual video education with verbal and written material and guidebook.  Patient learns why the best way to stop diseases of our time is prevention, through food and other lifestyle changes. Medicine (such as prescription pills and surgeries) is often only a Band-Aid on the problem, not a long-term solution. Most common diseases of our time include obesity, type 2 diabetes, hypertension, heart disease, and cancer. The Pritikin Program is recommended and has been proven to help reduce, reverse, and/or prevent the damaging effects of metabolic syndrome.  Nutrition   Overview of the Pritikin Eating Plan  Clinical staff conducted group or individual video education with verbal and written material and guidebook.  Patient learns about the Pritikin Eating Plan for disease risk reduction. The Pritikin Eating Plan  emphasizes a wide variety of unrefined, minimally-processed carbohydrates, like fruits, vegetables, whole grains, and legumes. Go, Caution, and Stop food choices are explained. Plant-based and lean animal proteins are emphasized. Rationale provided for low sodium intake for blood pressure control, low added sugars for blood sugar stabilization, and low added fats and oils for coronary artery disease risk reduction and weight management.  Calorie Density  Clinical staff conducted group or individual video education with verbal and written material and guidebook.  Patient learns about calorie density and how it impacts the Pritikin Eating Plan. Knowing the characteristics of the food you choose will help you decide whether those foods will lead to weight gain or weight loss, and whether you want to consume more or less of them. Weight loss is usually a side effect of the Pritikin Eating Plan because of its focus on low calorie-dense foods.  Label Reading  Clinical staff conducted group or individual video education with verbal and written material and guidebook.  Patient learns about the Pritikin recommended label reading guidelines and corresponding recommendations regarding calorie density, added sugars, sodium content, and whole grains.  Dining Out - Part 1  Clinical staff conducted group or individual video education with verbal and written material and guidebook.  Patient learns that restaurant meals can be sabotaging because they can be so high in calories, fat, sodium, and/or sugar. Patient learns recommended strategies on how to positively address this and avoid unhealthy pitfalls.  Facts on Fats  Clinical staff conducted group or individual video education with verbal and written material and guidebook.  Patient learns that lifestyle modifications can be just as effective, if not more so, as many medications for lowering your risk of heart disease. A Pritikin lifestyle can help to reduce your  risk of inflammation and atherosclerosis (cholesterol build-up, or plaque,  in the artery walls). Lifestyle interventions such as dietary choices and physical activity address the cause of atherosclerosis. A review of the types of fats and their impact on blood cholesterol levels, along with dietary recommendations to reduce fat intake is also included.  Nutrition Action Plan  Clinical staff conducted group or individual video education with verbal and written material and guidebook.  Patient learns how to incorporate Pritikin recommendations into their lifestyle. Recommendations include planning and keeping personal health goals in mind as an important part of their success.  Healthy Mind-Set    Healthy Minds, Bodies, Hearts  Clinical staff conducted group or individual video education with verbal and written material and guidebook.  Patient learns how to identify when they are stressed. Video will discuss the impact of that stress, as well as the many benefits of stress management. Patient will also be introduced to stress management techniques. The way we think, act, and feel has an impact on our hearts.  How Our Thoughts Can Heal Our Hearts  Clinical staff conducted group or individual video education with verbal and written material and guidebook.  Patient learns that negative thoughts can cause depression and anxiety. This can result in negative lifestyle behavior and serious health problems. Cognitive behavioral therapy is an effective method to help control our thoughts in order to change and improve our emotional outlook.  Additional Videos:  Exercise    Improving Performance  Clinical staff conducted group or individual video education with verbal and written material and guidebook.  Patient learns to use a non-linear approach by alternating intensity levels and lengths of time spent exercising to help burn more calories and lose more body fat. Cardiovascular exercise helps improve heart  health, metabolism, hormonal balance, blood sugar control, and recovery from fatigue. Resistance training improves strength, endurance, balance, coordination, reaction time, metabolism, and muscle mass. Flexibility exercise improves circulation, posture, and balance. Seek guidance from your physician and exercise physiologist before implementing an exercise routine and learn your capabilities and proper form for all exercise.  Introduction to Yoga  Clinical staff conducted group or individual video education with verbal and written material and guidebook.  Patient learns about yoga, a discipline of the coming together of mind, breath, and body. The benefits of yoga include improved flexibility, improved range of motion, better posture and core strength, increased lung function, weight loss, and positive self-image. Yoga's heart health benefits include lowered blood pressure, healthier heart rate, decreased cholesterol and triglyceride levels, improved immune function, and reduced stress. Seek guidance from your physician and exercise physiologist before implementing an exercise routine and learn your capabilities and proper form for all exercise.  Medical   Aging: Enhancing Your Quality of Life  Clinical staff conducted group or individual video education with verbal and written material and guidebook.  Patient learns key strategies and recommendations to stay in good physical health and enhance quality of life, such as prevention strategies, having an advocate, securing a Health Care Proxy and Power of Attorney, and keeping a list of medications and system for tracking them. It also discusses how to avoid risk for bone loss.  Biology of Weight Control  Clinical staff conducted group or individual video education with verbal and written material and guidebook.  Patient learns that weight gain occurs because we consume more calories than we burn (eating more, moving less). Even if your body weight is  normal, you may have higher ratios of fat compared to muscle mass. Too much body fat puts you at increased  risk for cardiovascular disease, heart attack, stroke, type 2 diabetes, and obesity-related cancers. In addition to exercise, following the Pritikin Eating Plan can help reduce your risk.  Decoding Lab Results  Clinical staff conducted group or individual video education with verbal and written material and guidebook.  Patient learns that lab test reflects one measurement whose values change over time and are influenced by many factors, including medication, stress, sleep, exercise, food, hydration, pre-existing medical conditions, and more. It is recommended to use the knowledge from this video to become more involved with your lab results and evaluate your numbers to speak with your doctor.   Diseases of Our Time - Overview  Clinical staff conducted group or individual video education with verbal and written material and guidebook.  Patient learns that according to the CDC, 50% to 70% of chronic diseases (such as obesity, type 2 diabetes, elevated lipids, hypertension, and heart disease) are avoidable through lifestyle improvements including healthier food choices, listening to satiety cues, and increased physical activity.  Sleep Disorders Clinical staff conducted group or individual video education with verbal and written material and guidebook.  Patient learns how good quality and duration of sleep are important to overall health and well-being. Patient also learns about sleep disorders and how they impact health along with recommendations to address them, including discussing with a physician.  Nutrition  Dining Out - Part 2 Clinical staff conducted group or individual video education with verbal and written material and guidebook.  Patient learns how to plan ahead and communicate in order to maximize their dining experience in a healthy and nutritious manner. Included are recommended  food choices based on the type of restaurant the patient is visiting.   Fueling a Banker conducted group or individual video education with verbal and written material and guidebook.  There is a strong connection between our food choices and our health. Diseases like obesity and type 2 diabetes are very prevalent and are in large-part due to lifestyle choices. The Pritikin Eating Plan provides plenty of food and hunger-curbing satisfaction. It is easy to follow, affordable, and helps reduce health risks.  Menu Workshop  Clinical staff conducted group or individual video education with verbal and written material and guidebook.  Patient learns that restaurant meals can sabotage health goals because they are often packed with calories, fat, sodium, and sugar. Recommendations include strategies to plan ahead and to communicate with the manager, chef, or server to help order a healthier meal.  Planning Your Eating Strategy  Clinical staff conducted group or individual video education with verbal and written material and guidebook.  Patient learns about the Pritikin Eating Plan and its benefit of reducing the risk of disease. The Pritikin Eating Plan does not focus on calories. Instead, it emphasizes high-quality, nutrient-rich foods. By knowing the characteristics of the foods, we choose, we can determine their calorie density and make informed decisions.  Targeting Your Nutrition Priorities  Clinical staff conducted group or individual video education with verbal and written material and guidebook.  Patient learns that lifestyle habits have a tremendous impact on disease risk and progression. This video provides eating and physical activity recommendations based on your personal health goals, such as reducing LDL cholesterol, losing weight, preventing or controlling type 2 diabetes, and reducing high blood pressure.  Vitamins and Minerals  Clinical staff conducted group or  individual video education with verbal and written material and guidebook.  Patient learns different ways to obtain key vitamins and minerals, including  through a recommended healthy diet. It is important to discuss all supplements you take with your doctor.   Healthy Mind-Set    Smoking Cessation  Clinical staff conducted group or individual video education with verbal and written material and guidebook.  Patient learns that cigarette smoking and tobacco addiction pose a serious health risk which affects millions of people. Stopping smoking will significantly reduce the risk of heart disease, lung disease, and many forms of cancer. Recommended strategies for quitting are covered, including working with your doctor to develop a successful plan.  Culinary   Becoming a Set designer conducted group or individual video education with verbal and written material and guidebook.  Patient learns that cooking at home can be healthy, cost-effective, quick, and puts them in control. Keys to cooking healthy recipes will include looking at your recipe, assessing your equipment needs, planning ahead, making it simple, choosing cost-effective seasonal ingredients, and limiting the use of added fats, salts, and sugars.  Cooking - Breakfast and Snacks  Clinical staff conducted group or individual video education with verbal and written material and guidebook.  Patient learns how important breakfast is to satiety and nutrition through the entire day. Recommendations include key foods to eat during breakfast to help stabilize blood sugar levels and to prevent overeating at meals later in the day. Planning ahead is also a key component.  Cooking - Educational psychologist conducted group or individual video education with verbal and written material and guidebook.  Patient learns eating strategies to improve overall health, including an approach to cook more at home. Recommendations include  thinking of animal protein as a side on your plate rather than center stage and focusing instead on lower calorie dense options like vegetables, fruits, whole grains, and plant-based proteins, such as beans. Making sauces in large quantities to freeze for later and leaving the skin on your vegetables are also recommended to maximize your experience.  Cooking - Healthy Salads and Dressing Clinical staff conducted group or individual video education with verbal and written material and guidebook.  Patient learns that vegetables, fruits, whole grains, and legumes are the foundations of the Pritikin Eating Plan. Recommendations include how to incorporate each of these in flavorful and healthy salads, and how to create homemade salad dressings. Proper handling of ingredients is also covered. Cooking - Soups and [employer] - Soups and Desserts Clinical staff conducted group or individual video education with verbal and written material and guidebook.  Patient learns that Pritikin soups and desserts make for easy, nutritious, and delicious snacks and meal components that are low in sodium, fat, sugar, and calorie density, while high in vitamins, minerals, and filling fiber. Recommendations include simple and healthy ideas for soups and desserts.   Overview     The Pritikin Solution Program Overview Clinical staff conducted group or individual video education with verbal and written material and guidebook.  Patient learns that the results of the Pritikin Program have been documented in more than 100 articles published in peer-reviewed journals, and the benefits include reducing risk factors for (and, in some cases, even reversing) high cholesterol, high blood pressure, type 2 diabetes, obesity, and more! An overview of the three key pillars of the Pritikin Program will be covered: eating well, doing regular exercise, and having a healthy mind-set.  WORKSHOPS  Exercise: Exercise Basics: Building Your  Action Plan Clinical staff led group instruction and group discussion with PowerPoint presentation and patient guidebook. To enhance the  learning environment the use of posters, models and videos may be added. At the conclusion of this workshop, patients will comprehend the difference between physical activity and exercise, as well as the benefits of incorporating both, into their routine. Patients will understand the FITT (Frequency, Intensity, Time, and Type) principle and how to use it to build an exercise action plan. In addition, safety concerns and other considerations for exercise and cardiac rehab will be addressed by the presenter. The purpose of this lesson is to promote a comprehensive and effective weekly exercise routine in order to improve patients' overall level of fitness.   Managing Heart Disease: Your Path to a Healthier Heart Clinical staff led group instruction and group discussion with PowerPoint presentation and patient guidebook. To enhance the learning environment the use of posters, models and videos may be added.At the conclusion of this workshop, patients will understand the anatomy and physiology of the heart. Additionally, they will understand how Pritikin's three pillars impact the risk factors, the progression, and the management of heart disease.  The purpose of this lesson is to provide a high-level overview of the heart, heart disease, and how the Pritikin lifestyle positively impacts risk factors.  Exercise Biomechanics Clinical staff led group instruction and group discussion with PowerPoint presentation and patient guidebook. To enhance the learning environment the use of posters, models and videos may be added. Patients will learn how the structural parts of their bodies function and how these functions impact their daily activities, movement, and exercise. Patients will learn how to promote a neutral spine, learn how to manage pain, and identify ways to  improve their physical movement in order to promote healthy living. The purpose of this lesson is to expose patients to common physical limitations that impact physical activity. Participants will learn practical ways to adapt and manage aches and pains, and to minimize their effect on regular exercise. Patients will learn how to maintain good posture while sitting, walking, and lifting.  Balance Training and Fall Prevention  Clinical staff led group instruction and group discussion with PowerPoint presentation and patient guidebook. To enhance the learning environment the use of posters, models and videos may be added. At the conclusion of this workshop, patients will understand the importance of their sensorimotor skills (vision, proprioception, and the vestibular system) in maintaining their ability to balance as they age. Patients will apply a variety of balancing exercises that are appropriate for their current level of function. Patients will understand the common causes for poor balance, possible solutions to these problems, and ways to modify their physical environment in order to minimize their fall risk. The purpose of this lesson is to teach patients about the importance of maintaining balance as they age and ways to minimize their risk of falling.  WORKSHOPS   Nutrition:  Fueling a Ship broker led group instruction and group discussion with PowerPoint presentation and patient guidebook. To enhance the learning environment the use of posters, models and videos may be added. Patients will review the foundational principles of the Pritikin Eating Plan and understand what constitutes a serving size in each of the food groups. Patients will also learn Pritikin-friendly foods that are better choices when away from home and review make-ahead meal and snack options. Calorie density will be reviewed and applied to three nutrition priorities: weight maintenance, weight loss, and  weight gain. The purpose of this lesson is to reinforce (in a group setting) the key concepts around what patients are recommended to eat and  how to apply these guidelines when away from home by planning and selecting Pritikin-friendly options. Patients will understand how calorie density may be adjusted for different weight management goals.  Mindful Eating  Clinical staff led group instruction and group discussion with PowerPoint presentation and patient guidebook. To enhance the learning environment the use of posters, models and videos may be added. Patients will briefly review the concepts of the Pritikin Eating Plan and the importance of low-calorie dense foods. The concept of mindful eating will be introduced as well as the importance of paying attention to internal hunger signals. Triggers for non-hunger eating and techniques for dealing with triggers will be explored. The purpose of this lesson is to provide patients with the opportunity to review the basic principles of the Pritikin Eating Plan, discuss the value of eating mindfully and how to measure internal cues of hunger and fullness using the Hunger Scale. Patients will also discuss reasons for non-hunger eating and learn strategies to use for controlling emotional eating.  Targeting Your Nutrition Priorities Clinical staff led group instruction and group discussion with PowerPoint presentation and patient guidebook. To enhance the learning environment the use of posters, models and videos may be added. Patients will learn how to determine their genetic susceptibility to disease by reviewing their family history. Patients will gain insight into the importance of diet as part of an overall healthy lifestyle in mitigating the impact of genetics and other environmental insults. The purpose of this lesson is to provide patients with the opportunity to assess their personal nutrition priorities by looking at their family history, their own health  history and current risk factors. Patients will also be able to discuss ways of prioritizing and modifying the Pritikin Eating Plan for their highest risk areas  Menu  Clinical staff led group instruction and group discussion with PowerPoint presentation and patient guidebook. To enhance the learning environment the use of posters, models and videos may be added. Using menus brought in from [employer], or printed from [employer], patients will apply the Pritikin dining out guidelines that were presented in the [employer] video. Patients will also be able to practice these guidelines in a variety of provided scenarios. The purpose of this lesson is to provide patients with the opportunity to practice hands-on learning of the Pritikin Dining Out guidelines with actual menus and practice scenarios.  Label Reading Clinical staff led group instruction and group discussion with PowerPoint presentation and patient guidebook. To enhance the learning environment the use of posters, models and videos may be added. Patients will review and discuss the Pritikin label reading guidelines presented in Pritikin's Label Reading Educational series video. Using fool labels brought in from local grocery stores and markets, patients will apply the label reading guidelines and determine if the packaged food meet the Pritikin guidelines. The purpose of this lesson is to provide patients with the opportunity to review, discuss, and practice hands-on learning of the Pritikin Label Reading guidelines with actual packaged food labels. Cooking School  Pritikin's [employer] are designed to teach patients ways to prepare quick, simple, and affordable recipes at home. The importance of nutrition's role in chronic disease risk reduction is reflected in its emphasis in the overall Pritikin program. By learning how to prepare essential core Pritikin Eating Plan recipes, patients will increase  control over what they eat; be able to customize the flavor of foods without the use of added salt, sugar, or fat; and improve the quality of the  food they consume. By learning a set of core recipes which are easily assembled, quickly prepared, and affordable, patients are more likely to prepare more healthy foods at home. These workshops focus on convenient breakfasts, simple entres, side dishes, and desserts which can be prepared with minimal effort and are consistent with nutrition recommendations for cardiovascular risk reduction. Cooking [employer] are taught by a Armed forces logistics/support/administrative officer (RD) who has been trained by the [employer]. The chef or RD has a clear understanding of the importance of minimizing - if not completely eliminating - added fat, sugar, and sodium in recipes. Throughout the series of Cooking School Workshop sessions, patients will learn about healthy ingredients and efficient methods of cooking to build confidence in their capability to prepare    Cooking School weekly topics:  Adding Flavor- Sodium-Free  Fast and Healthy Breakfasts  Powerhouse Plant-Based Proteins  Satisfying Salads and Dressings  Simple Sides and Sauces  International Cuisine-Spotlight on the [employer] Zones  Delicious Desserts  Savory Soups  [employer] - Meals in a Astronomer Appetizers and Snacks  Comforting Weekend Breakfasts  One-Pot Wonders   Fast Evening Meals  Landscape architect Your Pritikin Plate  WORKSHOPS   Healthy Mindset (Psychosocial):  Focused Goals, Sustainable Changes Clinical staff led group instruction and group discussion with PowerPoint presentation and patient guidebook. To enhance the learning environment the use of posters, models and videos may be added. Patients will be able to apply effective goal setting strategies to establish at least one personal goal, and then take consistent, meaningful action toward that goal. They will  learn to identify common barriers to achieving personal goals and develop strategies to overcome them. Patients will also gain an understanding of how our mind-set can impact our ability to achieve goals and the importance of cultivating a positive and growth-oriented mind-set. The purpose of this lesson is to provide patients with a deeper understanding of how to set and achieve personal goals, as well as the tools and strategies needed to overcome common obstacles which may arise along the way.  From Head to Heart: The Power of a Healthy Outlook  Clinical staff led group instruction and group discussion with PowerPoint presentation and patient guidebook. To enhance the learning environment the use of posters, models and videos may be added. Patients will be able to recognize and describe the impact of emotions and mood on physical health. They will discover the importance of self-care and explore self-care practices which may work for them. Patients will also learn how to utilize the 4 C's to cultivate a healthier outlook and better manage stress and challenges. The purpose of this lesson is to demonstrate to patients how a healthy outlook is an essential part of maintaining good health, especially as they continue their cardiac rehab journey.  Healthy Sleep for a Healthy Heart Clinical staff led group instruction and group discussion with PowerPoint presentation and patient guidebook. To enhance the learning environment the use of posters, models and videos may be added. At the conclusion of this workshop, patients will be able to demonstrate knowledge of the importance of sleep to overall health, well-being, and quality of life. They will understand the symptoms of, and treatments for, common sleep disorders. Patients will also be able to identify daytime and nighttime behaviors which impact sleep, and they will be able to apply these tools to help manage sleep-related challenges. The purpose of this  lesson is to provide patients with a general  overview of sleep and outline the importance of quality sleep. Patients will learn about a few of the most common sleep disorders. Patients will also be introduced to the concept of "sleep hygiene," and discover ways to self-manage certain sleeping problems through simple daily behavior changes. Finally, the workshop will motivate patients by clarifying the links between quality sleep and their goals of heart-healthy living.   Recognizing and Reducing Stress Clinical staff led group instruction and group discussion with PowerPoint presentation and patient guidebook. To enhance the learning environment the use of posters, models and videos may be added. At the conclusion of this workshop, patients will be able to understand the types of stress reactions, differentiate between acute and chronic stress, and recognize the impact that chronic stress has on their health. They will also be able to apply different coping mechanisms, such as reframing negative self-talk. Patients will have the opportunity to practice a variety of stress management techniques, such as deep abdominal breathing, progressive muscle relaxation, and/or guided imagery.  The purpose of this lesson is to educate patients on the role of stress in their lives and to provide healthy techniques for coping with it.  Learning Barriers/Preferences:   Education Topics:  Knowledge Questionnaire Score:   Core Components/Risk Factors/Patient Goals at Admission:   Core Components/Risk Factors/Patient Goals Review:   Goals and Risk Factor Review     Row Name 01/18/23 1345 02/11/23 1145 02/15/23 1446 03/15/23 1422       Core Components/Risk Factors/Patient Goals Review   Personal Goals Review Weight Management/Obesity;Lipids;Hypertension;Stress;Tobacco Cessation;Diabetes Weight Management/Obesity;Lipids;Hypertension;Stress;Tobacco Cessation;Diabetes Weight  Management/Obesity;Lipids;Hypertension;Stress;Tobacco Cessation;Diabetes Weight Management/Obesity;Lipids;Hypertension;Stress;Tobacco Cessation;Diabetes    Review Lysandra is off to a good start to exercise. Vital signs have been stable. Encouraged tobacco cessation. Gave phone number and hand out to [place] quit line. Vivien plans to return to exercise on 02/13/33. Joelynn's last day of exercise was on 01/14/23. Kyann returned to exercise at cardiac rehab on 02/14/23. Exercise stopped due to elevated BP exercise is now on hold due to elevated BP. Marland KitchenExercise remains  on hold due to elevated BP. Rhandi has a follow up appointment with Bernadene Person NP on 03/22/23.    Expected Outcomes Velta will continue to participate in cardia c rehab for exercise, nutrtion and lifestyle modifications. Will discuss and encourage smoking cessation Kazue will continue to participate in cardia c rehab for exercise, nutrtion and lifestyle modifications. Will discuss and encourage smoking cessation Riyana will continue to participate in cardia c rehab for exercise, nutrtion and lifestyle modifications. Will discuss and encourage smoking cessation Tola will continue to participate in cardia c rehab for exercise, nutrtion and lifestyle modifications. Will discuss and encourage smoking cessation             Core Components/Risk Factors/Patient Goals at Discharge (Final Review):   Goals and Risk Factor Review - 03/15/23 1422       Core Components/Risk Factors/Patient Goals Review   Personal Goals Review Weight Management/Obesity;Lipids;Hypertension;Stress;Tobacco Cessation;Diabetes    Review .Exercise remains  on hold due to elevated BP. Alaia has a follow up appointment with Bernadene Person NP on 03/22/23.    Expected Outcomes Kelleen will continue to participate in cardia c rehab for exercise, nutrtion and lifestyle modifications. Will discuss and encourage smoking cessation             ITP Comments:  ITP Comments     Row Name 01/18/23 1343  02/11/23 1141 02/15/23 1449 03/15/23 1420     ITP Comments 30 Day ITP Review. Skipper Cliche  is off to a good start to exercise at  cardiac rehab. 30 Day ITP Review. Tationa has not exercised since 01/14/23. Mellanie plans to return to exercise on 02/14/23. 30 Day ITP Review.  Meklit  returned  to exercise on 02/14/23. Exercise is now on hold due to elevated BP 30 Day ITP Review.  Kyrstyn's exercise remains on hold due to elevated BP             Comments: See ITP comments.Thayer Headings RN BSN

## 2023-03-16 ENCOUNTER — Encounter (HOSPITAL_COMMUNITY): Payer: Commercial Managed Care - HMO

## 2023-03-16 ENCOUNTER — Ambulatory Visit (HOSPITAL_COMMUNITY): Payer: Managed Care, Other (non HMO)

## 2023-03-18 ENCOUNTER — Encounter (HOSPITAL_COMMUNITY): Payer: Commercial Managed Care - HMO

## 2023-03-18 ENCOUNTER — Ambulatory Visit (HOSPITAL_COMMUNITY): Payer: Managed Care, Other (non HMO)

## 2023-03-21 ENCOUNTER — Encounter (HOSPITAL_COMMUNITY): Payer: Commercial Managed Care - HMO

## 2023-03-21 ENCOUNTER — Ambulatory Visit (HOSPITAL_COMMUNITY): Payer: Managed Care, Other (non HMO)

## 2023-03-22 ENCOUNTER — Ambulatory Visit: Payer: Commercial Managed Care - HMO | Admitting: Nurse Practitioner

## 2023-03-23 ENCOUNTER — Encounter (HOSPITAL_COMMUNITY): Admission: RE | Admit: 2023-03-23 | Payer: Commercial Managed Care - HMO | Source: Ambulatory Visit

## 2023-03-23 ENCOUNTER — Telehealth (HOSPITAL_COMMUNITY): Payer: Self-pay | Admitting: *Deleted

## 2023-03-23 ENCOUNTER — Ambulatory Visit (HOSPITAL_COMMUNITY): Payer: Managed Care, Other (non HMO)

## 2023-03-23 NOTE — Telephone Encounter (Signed)
 Left message to call cardiac rehab. Grizelda has bee absent due to elevated BP's with no office follow up until April will discharge.Thayer Headings RN BSN

## 2023-03-24 ENCOUNTER — Encounter (HOSPITAL_COMMUNITY): Payer: Self-pay | Admitting: *Deleted

## 2023-03-24 DIAGNOSIS — Z955 Presence of coronary angioplasty implant and graft: Secondary | ICD-10-CM

## 2023-03-24 NOTE — Progress Notes (Signed)
 Winda attended 8 exercise and eduction sessions between 12/27/22- 02/14/23. Antrice did not return to complete cardiac rehab due to elevated blood pressures. Dannielle has been discharged from the program due to non attendance.Thayer Headings RN BSN

## 2023-03-25 ENCOUNTER — Ambulatory Visit (HOSPITAL_COMMUNITY): Payer: Commercial Managed Care - HMO

## 2023-03-25 NOTE — Addendum Note (Signed)
 Addended by: Marjie Skiff on: 03/25/2023 10:38 PM   Modules accepted: Orders

## 2023-03-28 ENCOUNTER — Ambulatory Visit (HOSPITAL_COMMUNITY): Payer: Commercial Managed Care - HMO

## 2023-03-30 ENCOUNTER — Ambulatory Visit (HOSPITAL_COMMUNITY): Payer: Commercial Managed Care - HMO

## 2023-04-01 ENCOUNTER — Ambulatory Visit (HOSPITAL_COMMUNITY): Payer: Commercial Managed Care - HMO

## 2023-04-04 ENCOUNTER — Ambulatory Visit (HOSPITAL_COMMUNITY): Payer: Commercial Managed Care - HMO

## 2023-04-06 ENCOUNTER — Ambulatory Visit (HOSPITAL_COMMUNITY): Payer: Commercial Managed Care - HMO

## 2023-04-08 ENCOUNTER — Ambulatory Visit (HOSPITAL_COMMUNITY): Payer: Commercial Managed Care - HMO

## 2023-04-11 ENCOUNTER — Ambulatory Visit (HOSPITAL_COMMUNITY): Payer: Commercial Managed Care - HMO

## 2023-04-13 ENCOUNTER — Ambulatory Visit (HOSPITAL_COMMUNITY): Payer: Commercial Managed Care - HMO

## 2023-04-14 ENCOUNTER — Encounter: Payer: Self-pay | Admitting: Nurse Practitioner

## 2023-04-14 ENCOUNTER — Ambulatory Visit: Attending: Nurse Practitioner | Admitting: Nurse Practitioner

## 2023-04-14 VITALS — BP 154/82 | HR 68 | Ht 63.0 in | Wt 225.8 lb

## 2023-04-14 DIAGNOSIS — I2510 Atherosclerotic heart disease of native coronary artery without angina pectoris: Secondary | ICD-10-CM

## 2023-04-14 DIAGNOSIS — Z720 Tobacco use: Secondary | ICD-10-CM

## 2023-04-14 DIAGNOSIS — E785 Hyperlipidemia, unspecified: Secondary | ICD-10-CM

## 2023-04-14 DIAGNOSIS — E118 Type 2 diabetes mellitus with unspecified complications: Secondary | ICD-10-CM

## 2023-04-14 DIAGNOSIS — I10 Essential (primary) hypertension: Secondary | ICD-10-CM

## 2023-04-14 LAB — COMPREHENSIVE METABOLIC PANEL WITH GFR
ALT: 8 IU/L (ref 0–32)
AST: 10 IU/L (ref 0–40)
Albumin: 4.1 g/dL (ref 3.9–4.9)
Alkaline Phosphatase: 84 IU/L (ref 44–121)
BUN/Creatinine Ratio: 19 (ref 12–28)
BUN: 18 mg/dL (ref 8–27)
Bilirubin Total: 0.3 mg/dL (ref 0.0–1.2)
CO2: 25 mmol/L (ref 20–29)
Calcium: 9 mg/dL (ref 8.7–10.3)
Chloride: 105 mmol/L (ref 96–106)
Creatinine, Ser: 0.96 mg/dL (ref 0.57–1.00)
Globulin, Total: 2.7 g/dL (ref 1.5–4.5)
Glucose: 133 mg/dL — ABNORMAL HIGH (ref 70–99)
Potassium: 4.3 mmol/L (ref 3.5–5.2)
Sodium: 142 mmol/L (ref 134–144)
Total Protein: 6.8 g/dL (ref 6.0–8.5)
eGFR: 66 mL/min/{1.73_m2} (ref >59)

## 2023-04-14 LAB — LIPID PANEL
Chol/HDL Ratio: 3.6 ratio (ref 0.0–4.4)
Cholesterol, Total: 193 mg/dL (ref 100–199)
HDL: 53 mg/dL (ref >39)
LDL Chol Calc (NIH): 125 mg/dL — ABNORMAL HIGH (ref 0–99)
Triglycerides: 83 mg/dL (ref 0–149)
VLDL Cholesterol Cal: 15 mg/dL (ref 5–40)

## 2023-04-14 NOTE — Progress Notes (Signed)
 Office Visit    Patient Name: Cynthia Patton Date of Encounter: 04/14/2023  Primary Care Provider:  Ivonne Andrew, NP Primary Cardiologist:  Tonny Bollman, MD  Chief Complaint   63 year old female with a history of CAD s/p inferior STEMI, DES-RCA in 11/2022, hypertension, hyperlipidemia, diet-controlled diabetes, thyroid cancer s/p thyroidectomy, and tobacco use who presents for follow-up related to CAD and hypertension.  Past Medical History    Past Medical History:  Diagnosis Date   Acute low back pain 03/25/2020   Anxiety    Cancer (HCC) 2000?   thyroid cancer   Depression    Diabetes mellitus without complication (HCC)    Hyperlipidemia    Hypertension    Thyroid disease    Past Surgical History:  Procedure Laterality Date   ABDOMINAL HYSTERECTOMY     BREAST CYST EXCISION Left    CHOLECYSTECTOMY     CORONARY/GRAFT ACUTE MI REVASCULARIZATION N/A 11/13/2022   Procedure: Coronary/Graft Acute MI Revascularization;  Surgeon: Tonny Bollman, MD;  Location: [facility] INVASIVE CV LAB;  Service: Cardiovascular;  Laterality: N/A;   INCISE AND DRAIN ABCESS     on right leg    LEFT HEART CATH AND CORONARY ANGIOGRAPHY N/A 11/13/2022   Procedure: LEFT HEART CATH AND CORONARY ANGIOGRAPHY;  Surgeon: Tonny Bollman, MD;  Location: [facility] INVASIVE CV LAB;  Service: Cardiovascular;  Laterality: N/A;   THYROIDECTOMY      Allergies  Allergies  Allergen Reactions   Floxin [Ocuflox] Other (See Comments)    Disoriented   Ofloxacin Other (See Comments)    Disoriented   Oxycodone-Acetaminophen     Other Reaction(s): Other (See Comments)  Hallucinations   Percocet [Oxycodone-Acetaminophen]      Labs/Other Studies Reviewed    The following studies were reviewed today:  Cardiac Studies & Procedures   ______________________________________________________________________________________________ CARDIAC CATHETERIZATION  CARDIAC CATHETERIZATION 11/13/2022  Narrative   Mid RCA lesion  is 100% stenosed.   Prox LAD to Mid LAD lesion is 30% stenosed.   Mid LM to Dist LM lesion is 25% stenosed.   Mid Cx lesion is 40% stenosed.   A drug-eluting stent was successfully placed using a STENT SYNERGY XD 3.0X32.   Post intervention, there is a 0% residual stenosis.   LV end diastolic pressure is moderately elevated.   Recommend uninterrupted dual antiplatelet therapy with Aspirin 81mg  daily and Ticagrelor 90mg  twice daily for a minimum of 12 months (ACS-Class I recommendation).  1.  Acute inferior MI secondary to mid RCA occlusion, treated successfully with primary PCI using a 3.0 x 32 mm Synergy DES.  Baseline 100% stenosis, TIMI 0 flow.  Post-PCI 0% residual stenosis and TIMI-3 flow. 2.  Mild nonobstructive plaquing of the left main, LAD, and left circumflex, without any high-grade stenoses in the left coronary distribution 3.  Moderately elevated LVEDP 24 mmHg  Recommendations: 2D echocardiogram for assessment of LV function Aggrastat discontinued at completion of procedure DAPT with aspirin and ticagrelor for 12 months without interruption Aggressive risk modification with high intensity statin drug, goal LDL less than 55 Consider fast-track discharge tomorrow if criteria met  Findings Coronary Findings Diagnostic  Dominance: Right  Left Main Mid LM to Dist LM lesion is 25% stenosed.  Left Anterior Descending Prox LAD to Mid LAD lesion is 30% stenosed.  Left Circumflex Mid Cx lesion is 40% stenosed.  Right Coronary Artery Mid RCA lesion is 100% stenosed. Vessel is the culprit lesion. The lesion is type C and thrombotic.  Intervention  Mid RCA  lesion Stent A drug-eluting stent was successfully placed using a STENT SYNERGY XD 3.0X32. Post-stent angioplasty was performed using a BALLN [place] EMERGE MR 3.25X15. Maximum pressure:  14 atm. Post-Intervention Lesion Assessment The intervention was successful. Pre-interventional TIMI flow is 0. Post-intervention TIMI flow is  3. No complications occurred at this lesion. There is a 0% residual stenosis post intervention.     ECHOCARDIOGRAM  ECHOCARDIOGRAM COMPLETE 11/14/2022  Narrative ECHOCARDIOGRAM REPORT    Patient Name:   Cynthia Patton Date of Exam: 11/14/2022 Medical Rec #:  [id number]      Height:       63.0 in Accession #:    [id number]     Weight:       214.9 lb Date of Birth:  1960/03/15       BSA:          1.994 m Patient Age:    62 years       BP:           157/68 mmHg Patient Gender: F              HR:           66 bpm. Exam Location:  Inpatient  Procedure: 2D Echo, Cardiac Doppler and Color Doppler  Indications:    I25.110 Atherosclerotic heart disease of native coronary artery with unstable angina pectoris  History:        Patient has no prior history of Echocardiogram examinations. CAD and Acute MI, Signs/Symptoms:Altered Mental Status; Risk Factors:Hypertension, Diabetes and Dyslipidemia.  Sonographer:    Sheralyn Boatman RDCS Referring Phys: [phone] MICHAEL COOPER   Sonographer Comments: Technically difficult study due to poor echo windows and patient is obese. Image acquisition challenging due to patient body habitus. Patient did not tolerate pressure of probe well. IMPRESSIONS   1. Left ventricular ejection fraction, by estimation, is 60 to 65%. The left ventricle has normal function. The left ventricle has no regional wall motion abnormalities. There is mild concentric left ventricular hypertrophy. Left ventricular diastolic parameters are consistent with Grade I diastolic dysfunction (impaired relaxation). Elevated left atrial pressure. 2. Right ventricular systolic function is normal. The right ventricular size is normal. Tricuspid regurgitation signal is inadequate for assessing PA pressure. 3. The mitral valve is normal in structure. No evidence of mitral valve regurgitation. No evidence of mitral stenosis. 4. The aortic valve is tricuspid. Aortic valve regurgitation is not visualized.  No aortic stenosis is present. 5. The inferior vena cava is normal in size with greater than 50% respiratory variability, suggesting right atrial pressure of 3 mmHg.  Comparison(s): No prior Echocardiogram.  FINDINGS Left Ventricle: Left ventricular ejection fraction, by estimation, is 60 to 65%. The left ventricle has normal function. The left ventricle has no regional wall motion abnormalities. The left ventricular internal cavity size was normal in size. There is mild concentric left ventricular hypertrophy. Left ventricular diastolic parameters are consistent with Grade I diastolic dysfunction (impaired relaxation). Elevated left atrial pressure.  Right Ventricle: The right ventricular size is normal. Right ventricular systolic function is normal. Tricuspid regurgitation signal is inadequate for assessing PA pressure. The tricuspid regurgitant velocity is 2.44 m/s, and with an assumed right atrial pressure of 3 mmHg, the estimated right ventricular systolic pressure is 26.8 mmHg.  Left Atrium: Left atrial size was normal in size.  Right Atrium: Right atrial size was normal in size.  Pericardium: There is no evidence of pericardial effusion.  Mitral Valve: The mitral valve is normal in structure.  No evidence of mitral valve regurgitation. No evidence of mitral valve stenosis.  Tricuspid Valve: The tricuspid valve is normal in structure. Tricuspid valve regurgitation is trivial. No evidence of tricuspid stenosis.  Aortic Valve: The aortic valve is tricuspid. Aortic valve regurgitation is not visualized. No aortic stenosis is present.  Pulmonic Valve: The pulmonic valve was normal in structure. Pulmonic valve regurgitation is not visualized. No evidence of pulmonic stenosis.  Aorta: The aortic root is normal in size and structure.  Venous: The inferior vena cava is normal in size with greater than 50% respiratory variability, suggesting right atrial pressure of 3 mmHg.  IAS/Shunts: No  atrial level shunt detected by color flow Doppler.   LEFT VENTRICLE PLAX 2D LVIDd:         5.00 cm     Diastology LVIDs:         3.45 cm     LV e' medial:    3.59 cm/s LV PW:         1.30 cm     LV E/e' medial:  22.7 LV IVS:        1.25 cm     LV e' lateral:   4.68 cm/s LVOT diam:     2.20 cm     LV E/e' lateral: 17.4 LV SV:         73 LV SV Index:   37 LVOT Area:     3.80 cm  LV Volumes (MOD) LV vol d, MOD A2C: 72.4 ml LV vol d, MOD A4C: 77.3 ml LV vol s, MOD A2C: 28.2 ml LV vol s, MOD A4C: 38.8 ml LV SV MOD A2C:     44.2 ml LV SV MOD A4C:     77.3 ml LV SV MOD BP:      42.7 ml  RIGHT VENTRICLE             IVC RV S prime:     11.70 cm/s  IVC diam: 2.20 cm TAPSE (M-mode): 2.3 cm  LEFT ATRIUM             Index        RIGHT ATRIUM           Index LA diam:        3.30 cm 1.66 cm/m   RA Area:     11.60 cm LA Vol (A2C):   49.3 ml 24.73 ml/m  RA Volume:   24.10 ml  12.09 ml/m LA Vol (A4C):   54.3 ml 27.23 ml/m LA Biplane Vol: 52.7 ml 26.43 ml/m AORTIC VALVE LVOT Vmax:   89.10 cm/s LVOT Vmean:  59.200 cm/s LVOT VTI:    0.193 m  AORTA Ao Root diam: 2.70 cm Ao Asc diam:  3.10 cm  MITRAL VALVE                TRICUSPID VALVE MV Area (PHT): 3.72 cm     TR Peak grad:   23.8 mmHg MV Decel Time: 204 msec     TR Vmax:        244.00 cm/s MV E velocity: 81.55 cm/s MV A velocity: 107.50 cm/s  SHUNTS MV E/A ratio:  0.76         Systemic VTI:  0.19 m Systemic Diam: 2.20 cm  Olga Millers MD Electronically signed by Olga Millers MD Signature Date/Time: 11/14/2022/11:54:12 AM    Final          ______________________________________________________________________________________________     Recent Labs: 11/13/2022: ALT 11 11/14/2022: Hemoglobin  12.1; Platelets 309; TSH 1.305 11/23/2022: BUN 17; Creatinine, Ser 1.19; Potassium 4.0; Sodium 142  Recent Lipid Panel    Component Value Date/Time   CHOL 175 11/13/2022 1934   CHOL 179 02/09/2021 1139   TRIG 141  11/13/2022 1934   HDL 41 11/13/2022 1934   HDL 47 02/09/2021 1139   CHOLHDL 4.3 11/13/2022 1934   VLDL 28 11/13/2022 1934   LDLCALC 106 (H) 11/13/2022 1934   LDLCALC 110 (H) 02/09/2021 1139    History of Present Illness    63 year old female with the above past medical history including CAD s/p inferior STEMI, DES-RCA in 11/2022, hypertension, hyperlipidemia, diet-controlled diabetes, thyroid cancer s/p thyroidectomy, and tobacco use.  She was hospitalized in November 2024 in the setting of inferior STEMI.  Cardiology was consulted.  She underwent emergent cardiac catheterization which revealed 100% stenosis of the RCA, otherwise mild nonobstructive disease of the left main, LAD and left circumflex arteries, LVEDP was elevated at 24 mmHg.  She underwent successful PCI/DES-RCA.  Echocardiogram showed EF 60 to 65%, mild LVH, G1 DD.  She was started on DAPT with aspirin and Brilinta.  She was later transitioned to Plavix.   She was last seen in the office on 02/21/2023 and was stable overall from a cardiac standpoint.  She denies symptoms concerning for angina.  She reported to concern for constipation with Plavix.  She was transitioned to Effient.  BP was elevated.  She was transitioned from losartan to olmesartan, carvedilol was increased to 12.5 mg twice daily.  Itamar sleep study was ordered given suspected sleep apnea.   She presents today for follow-up.  Since her last visit she has done well from a cardiac standpoint.  She denies symptoms concerning for angina.  She is no longer participating in cardiac rehab, however, she continues to walk regularly.  Her BP has remained elevated despite transitioning from valsartan to olmesartan, and despite increasing her carvedilol.  She continues to smoke but has cut back significantly.  She is somewhat frustrated that her BP remains elevated.  She reports that she has had difficulty communicating with our office regarding her medication management and ongoing  elevated BP.  She has not yet completed her sleep study but plans to do so.  Home Medications    Current Outpatient Medications  Medication Sig Dispense Refill   aspirin 81 MG chewable tablet Chew 1 tablet (81 mg total) by mouth daily. 90 tablet 3   atorvastatin (LIPITOR) 80 MG tablet Take 1 tablet (80 mg total) by mouth daily. 30 tablet 2   carvedilol (COREG) 12.5 MG tablet Take 1 tablet (12.5 mg total) by mouth 2 (two) times daily. 60 tablet 3   fluticasone (FLONASE) 50 MCG/ACT nasal spray Place 2 sprays into both nostrils daily. (Patient taking differently: Place 2 sprays into both nostrils as needed.) 16 g 6   levothyroxine (SYNTHROID) 175 MCG tablet Take 175 mcg by mouth daily before breakfast.     nitroGLYCERIN (NITROSTAT) 0.4 MG SL tablet Place 1 tablet (0.4 mg total) under the tongue every 5 (five) minutes as needed for chest pain. 25 tablet 2   olmesartan (BENICAR) 40 MG tablet Take 1 tablet (40 mg total) by mouth daily. 30 tablet 6   prasugrel (EFFIENT) 10 MG TABS tablet Take 1 tablet (10 mg total) by mouth daily. TAKE 30MG  1ST DOSE THEN 10MG  THEREAFTER 33 tablet 3   amLODipine (NORVASC) 5 MG tablet Take 5 mg by mouth daily. (Patient not taking: Reported on 04/14/2023)  blood glucose meter kit and supplies KIT Dispense based on patient and insurance preference. Use up to four times daily as directed. (FOR ICD-9 250.00, 250.01). (Patient not taking: Reported on 04/14/2023) 1 each 0   losartan (COZAAR) 100 MG tablet Take 100 mg by mouth daily. (Patient not taking: Reported on 04/14/2023)     mometasone (NASONEX) 50 MCG/ACT nasal spray Place 2 sprays into the nose daily. (Patient taking differently: Place 2 sprays into the nose as needed.) 17 g 1   nicotine (NICODERM CQ - DOSED IN MG/24 HOURS) 14 mg/24hr patch Place 14 mg onto the skin daily. (Patient not taking: Reported on 04/14/2023)     ONETOUCH VERIO test strip USE UP TO 4 TIMES DAILY AS DIRECTED (Patient not taking: Reported on 04/14/2023)      No current facility-administered medications for this visit.     Review of Systems    She denies chest pain, palpitations, dyspnea, pnd, orthopnea, n, v, dizziness, syncope, edema, weight gain, or early satiety. All other systems reviewed and are otherwise negative except as noted above.   Physical Exam    VS:  BP (!) 154/82   Pulse 68   Ht 5\' 3"  (1.6 m)   Wt 225 lb 12.8 oz (102.4 kg)   SpO2 95%   BMI 40.00 kg/m   GEN: Well nourished, well developed, in no acute distress. HEENT: normal. Neck: Supple, no JVD, carotid bruits, or masses. Cardiac: RRR, no murmurs, rubs, or gallops. No clubbing, cyanosis, edema.  Radials/DP/PT 2+ and equal bilaterally.  Respiratory:  Respirations regular and unlabored, clear to auscultation bilaterally. GI: Soft, nontender, nondistended, BS + x 4. MS: no deformity or atrophy. Skin: warm and dry, no rash. Neuro:  Strength and sensation are intact. Psych: Normal affect.  Accessory Clinical Findings    ECG personally reviewed by me today -    - no EKG in office today.   Lab Results  Component Value Date   WBC 9.8 11/14/2022   HGB 12.1 11/14/2022   HCT 36.9 11/14/2022   MCV 92.7 11/14/2022   PLT 309 11/14/2022   Lab Results  Component Value Date   CREATININE 1.19 (H) 11/23/2022   BUN 17 11/23/2022   NA 142 11/23/2022   K 4.0 11/23/2022   CL 101 11/23/2022   CO2 27 11/23/2022   Lab Results  Component Value Date   ALT 11 11/13/2022   AST 19 11/13/2022   ALKPHOS 67 11/13/2022   BILITOT 0.7 11/13/2022   Lab Results  Component Value Date   CHOL 175 11/13/2022   HDL 41 11/13/2022   LDLCALC 106 (H) 11/13/2022   TRIG 141 11/13/2022   CHOLHDL 4.3 11/13/2022    Lab Results  Component Value Date   HGBA1C 6.8 (H) 11/13/2022    Assessment & Plan    1. CAD: S/p inferior STEMI, DES-RCA in 11/2022. Echocardiogram showed EF 60 to 65%, mild LVH, G1 DD. Stable with no anginal symptoms. No indication for ischemic evaluation. Continue ASA,  Effient, Olmesartan, carvedilol, amlodipine as below, and Lipitor.   2. Hypertension: BP  elevated in office today. She did not take her medicine prior to today's visit.  She reports that her BP is normally elevated, with SBP in the 160s.  She is agreeable to start amlodipine 5 mg daily.  Continue to monitor BP report BP consistently greater than 130/80.  Otherwise, continue current antihypertensive regimen.   3. Hyperlipidemia: LDL was 106 in 11/2022. Will repeat fasting lipids, CMET today.  Continue Lipitor.  4. Type 2 diabetes: A1c was 6.3 in 01/2023, diet controlled, monitored per PCP.   5. Tobacco use: She continues to smoke. Full cessation advised.   6.  History of snoring/Sleep disordered breathing: Pending Itamar sleep study.   7. Disposition: Follow-up in 6 weeks with APP.  Follow-up in 6 months with Dr. Excell Seltzer.  HYPERTENSION CONTROL Vitals:   04/14/23 0822 04/14/23 0842  BP: (!) 168/68 (!) 154/82    The patient's blood pressure is elevated above target today.  In order to address the patient's elevated BP: Blood pressure will be monitored at home to determine if medication changes need to be made.; A new medication was prescribed today.; Follow up with general cardiology has been recommended.      Joylene Grapes, NP 04/14/2023, 10:17 AM

## 2023-04-14 NOTE — Patient Instructions (Signed)
 Medication Instructions:   Start taking Amlodipine 5 mg daily  Continue with all other medications   *If you need a refill on your cardiac medications before your next appointment, please call your pharmacy*   Lab Work: LIPID CMP  If you have labs (blood work) drawn today and your tests are completely normal, you will receive your results only by: MyChart Message (if you have MyChart) OR A paper copy in the mail If you have any lab test that is abnormal or we need to change your treatment, we will call you to review the results.  Other Instructions    Please monitor your blood pressure daily for next 2 weeks , send a copy of reading through mychart  for review.  Testing/Procedures:  Not needed  Follow-Up: At [facility], you and your health needs are our priority.  As part of our continuing mission to provide you with exceptional heart care, we have created designated Provider Care Teams.  These Care Teams include your primary Cardiologist (physician) and Advanced Practice Providers (APPs -  Physician Assistants and Nurse Practitioners) who all work together to provide you with the care you need, when you need it.     Your next appointment:   6 week(s)  The format for your next appointment:   In Person  Provider:   Tonny Bollman, MD or Bernadene Person, NP    Then, Tonny Bollman, MD will plan to see you again in 6 month(s).   Other Instructions   s

## 2023-04-15 ENCOUNTER — Ambulatory Visit (HOSPITAL_COMMUNITY): Payer: Commercial Managed Care - HMO

## 2023-04-18 ENCOUNTER — Telehealth: Payer: Self-pay

## 2023-04-18 NOTE — Telephone Encounter (Signed)
 Lmom to discuss lab results and recommendations. Waiting on a return call.

## 2023-04-19 NOTE — Telephone Encounter (Signed)
 Patient was calling back, ask that she get a call back after 430. Please advise

## 2023-04-27 NOTE — Telephone Encounter (Signed)
 LMOM to discuss lab results and recommendations. Will call back back.

## 2023-04-27 NOTE — Telephone Encounter (Signed)
 Will contact pt after 4:30 PM

## 2023-04-28 ENCOUNTER — Encounter (HOSPITAL_BASED_OUTPATIENT_CLINIC_OR_DEPARTMENT_OTHER): Payer: Self-pay

## 2023-05-02 NOTE — Telephone Encounter (Signed)
 Left a detailed message for pt. Pt was advised to call back to discuss lab results or check the message and respond back via mychart.

## 2023-05-17 ENCOUNTER — Encounter: Payer: Self-pay | Admitting: Family Medicine

## 2023-05-17 ENCOUNTER — Ambulatory Visit: Payer: Commercial Managed Care - HMO | Admitting: Family Medicine

## 2023-05-17 VITALS — BP 147/79 | HR 72

## 2023-05-17 DIAGNOSIS — E118 Type 2 diabetes mellitus with unspecified complications: Secondary | ICD-10-CM

## 2023-05-17 DIAGNOSIS — Z7689 Persons encountering health services in other specified circumstances: Secondary | ICD-10-CM

## 2023-05-17 DIAGNOSIS — F17210 Nicotine dependence, cigarettes, uncomplicated: Secondary | ICD-10-CM

## 2023-05-17 DIAGNOSIS — F32A Depression, unspecified: Secondary | ICD-10-CM

## 2023-05-17 DIAGNOSIS — E039 Hypothyroidism, unspecified: Secondary | ICD-10-CM

## 2023-05-17 DIAGNOSIS — F418 Other specified anxiety disorders: Secondary | ICD-10-CM

## 2023-05-17 DIAGNOSIS — I10 Essential (primary) hypertension: Secondary | ICD-10-CM

## 2023-05-17 DIAGNOSIS — E785 Hyperlipidemia, unspecified: Secondary | ICD-10-CM

## 2023-05-17 MED ORDER — AMLODIPINE BESYLATE 5 MG PO TABS
5.0000 mg | ORAL_TABLET | Freq: Every day | ORAL | 0 refills | Status: DC
Start: 2023-05-17 — End: 2023-07-19

## 2023-05-17 MED ORDER — MIRTAZAPINE 7.5 MG PO TABS
7.5000 mg | ORAL_TABLET | Freq: Every day | ORAL | 1 refills | Status: DC
Start: 2023-05-17 — End: 2023-08-10

## 2023-05-17 NOTE — Progress Notes (Unsigned)
 New Patient Office Visit  Subjective    Patient ID: Cynthia Patton, female    DOB: November 27, 1960  Age: 63 y.o. MRN: [id number]  CC:  Chief Complaint  Patient presents with   New Patient (Initial Visit)   Medication Refill    HPI Cynthia Patton presents to establish care ***  Outpatient Encounter Medications as of 05/17/2023  Medication Sig   amLODipine  (NORVASC ) 5 MG tablet Take 5 mg by mouth daily.   aspirin  81 MG chewable tablet Chew 1 tablet (81 mg total) by mouth daily.   atorvastatin  (LIPITOR) 80 MG tablet Take 1 tablet (80 mg total) by mouth daily.   blood glucose meter kit and supplies KIT Dispense based on patient and insurance preference. Use up to four times daily as directed. (FOR ICD-9 250.00, 250.01).   carvedilol  (COREG ) 12.5 MG tablet Take 1 tablet (12.5 mg total) by mouth 2 (two) times daily.   fluticasone  (FLONASE ) 50 MCG/ACT nasal spray Place 2 sprays into both nostrils daily. (Patient taking differently: Place 2 sprays into both nostrils as needed.)   levothyroxine  (SYNTHROID ) 175 MCG tablet Take 175 mcg by mouth daily before breakfast.   losartan  (COZAAR ) 100 MG tablet Take 100 mg by mouth daily.   mometasone  (NASONEX ) 50 MCG/ACT nasal spray Place 2 sprays into the nose daily. (Patient taking differently: Place 2 sprays into the nose as needed.)   nicotine  (NICODERM CQ  - DOSED IN MG/24 HOURS) 14 mg/24hr patch Place 14 mg onto the skin daily.   nitroGLYCERIN  (NITROSTAT ) 0.4 MG SL tablet Place 1 tablet (0.4 mg total) under the tongue every 5 (five) minutes as needed for chest pain.   olmesartan  (BENICAR ) 40 MG tablet Take 1 tablet (40 mg total) by mouth daily.   ONETOUCH VERIO test strip    prasugrel  (EFFIENT ) 10 MG TABS tablet Take 1 tablet (10 mg total) by mouth daily. TAKE 30MG  1ST DOSE THEN 10MG  THEREAFTER   No facility-administered encounter medications on file as of 05/17/2023.    Past Medical History:  Diagnosis Date   Acute low back pain 03/25/2020    Anxiety    Cancer (HCC) 2000?   thyroid  cancer   Depression    Diabetes mellitus without complication (HCC)    Hyperlipidemia    Hypertension    Thyroid  disease     Past Surgical History:  Procedure Laterality Date   ABDOMINAL HYSTERECTOMY     BREAST CYST EXCISION Left    CHOLECYSTECTOMY     CORONARY/GRAFT ACUTE MI REVASCULARIZATION N/A 11/13/2022   Procedure: Coronary/Graft Acute MI Revascularization;  Surgeon: Arnoldo Lapping, MD;  Location[facility] INVAS[facility] CV LAB;  Service: Cardiovascular;  Laterality: N/A;   INCISE AND DRAIN ABCESS     on right leg    LEFT HEART CATH AND CORONARY ANGIOGRAPHY N/A 11/13/2022   Procedure: LEFT HEART CATH AND CORONARY ANGIOGRAPHY;  Surgeon: Arnoldo Lapping, MD;  [facility] INVASIVE CV LAB;  Service: Cardiovascular;  Laterality: N/A;   THYROIDECTOMY      Family History  Problem Relation Age of Onset   Kidney disease Mother    Hypertension Mother    Hypertension Sister    Heart disease Brother    Colon cancer Neg Hx    Colon polyps Neg Hx    Esophageal cancer Neg Hx    Rectal cancer Neg Hx    Stomach cancer Neg Hx     Social History   Socioeconomic History   Marital status: Single    Spouse  name: Not on file   Number of children: Not on file   Years of education: Not on file   Highest education level: Not on file  Occupational History   Not on file  Tobacco Use   Smoking status: Every Day    Current packs/day: 0.50    Types: Cigarettes   Smokeless tobacco: Never  Vaping Use   Vaping status: Never Used  Substance and Sexual Activity   Alcohol use: No   Drug use: No   Sexual activity: Not Currently  Other Topics Concern   Not on file  Social History Narrative   Not on file   Social Drivers of Health   Financial Resource Strain: Not on file  Food Insecurity: No Food Insecurity (11/14/2022)   Hunger Vital Sign    Worried About Running Out of Food in the Last Year: Never true    Ran Out of Food in the Last Year: Never true   Transportation Needs: No Transportation Needs (11/14/2022)   PRAPARE - Administrator, Civil Service (Medical): No    Lack of Transportation (Non-Medical): No  Physical Activity: Not on file  Stress: Not on file  Social Connections: Not on file  Intimate Partner Violence: Not At Risk (11/14/2022)   Humiliation, Afraid, Rape, and Kick questionnaire    Fear of Current or Ex-Partner: No    Emotionally Abused: No    Physically Abused: No    Sexually Abused: No    ROS      Objective   BP (!) 147/79 (BP Location: Right Arm, Patient Position: Sitting, Cuff Size: Large)   Pulse 72   SpO2 95%   Physical Exam  {Labs (Optional):23779}    Assessment & Plan:   There are no diagnoses linked to this encounter.   No follow-ups on file.   Arlo Lama, MD

## 2023-05-19 ENCOUNTER — Encounter: Payer: Self-pay | Admitting: Family Medicine

## 2023-05-27 ENCOUNTER — Ambulatory Visit: Attending: Nurse Practitioner | Admitting: Nurse Practitioner

## 2023-05-27 ENCOUNTER — Ambulatory Visit: Payer: Self-pay

## 2023-05-27 ENCOUNTER — Encounter: Payer: Self-pay | Admitting: Nurse Practitioner

## 2023-05-27 ENCOUNTER — Telehealth: Payer: Self-pay

## 2023-05-27 VITALS — BP 146/62 | HR 73 | Ht 63.5 in | Wt 225.0 lb

## 2023-05-27 DIAGNOSIS — I10 Essential (primary) hypertension: Secondary | ICD-10-CM | POA: Diagnosis not present

## 2023-05-27 DIAGNOSIS — E785 Hyperlipidemia, unspecified: Secondary | ICD-10-CM | POA: Diagnosis not present

## 2023-05-27 DIAGNOSIS — I2510 Atherosclerotic heart disease of native coronary artery without angina pectoris: Secondary | ICD-10-CM

## 2023-05-27 DIAGNOSIS — Z720 Tobacco use: Secondary | ICD-10-CM

## 2023-05-27 DIAGNOSIS — E118 Type 2 diabetes mellitus with unspecified complications: Secondary | ICD-10-CM | POA: Diagnosis not present

## 2023-05-27 DIAGNOSIS — R0683 Snoring: Secondary | ICD-10-CM

## 2023-05-27 NOTE — Patient Instructions (Addendum)
 Medication Instructions:  Your physician recommends that you continue on your current medications as directed. Please refer to the Current Medication list given to you today.  *If you need a refill on your cardiac medications before your next appointment, please call your pharmacy*  Lab Work: NONE ordered at this time of appointment   Testing/Procedures: NONE ordered at this time of appointment    Follow-Up: At [facility], you and your health needs are our priority.  As part of our continuing mission to provide you with exceptional heart care, our providers are all part of one team.  This team includes your primary Cardiologist (physician) and Advanced Practice Providers or APPs (Physician Assistants and Nurse Practitioners) who all work together to provide you with the care you need, when you need it.  Your next appointment:   4-6 month(s)  Provider:   Arnoldo Lapping, MD or Marlana Silvan, NP          We recommend signing up for the patient portal called "MyChart".  Sign up information is provided on this After Visit Summary.  MyChart is used to connect with patients for Virtual Visits (Telemedicine).  Patients are able to view lab/test results, encounter notes, upcoming appointments, etc.  Non-urgent messages can be sent to your provider as well.   To learn more about what you can do with MyChart, go to [email or web address].

## 2023-05-27 NOTE — Telephone Encounter (Signed)
**Note De-Identified Josecarlos Harriott Obfuscation** Ordering provider: Sharren Decree, NP Associated diagnoses: Snoring-R06.83 and Somnolence-R40.0  WatchPAT PA obtained on 05/27/2023 by Lashaya Kienitz, Isabella Mao, LPN. Authorization: Per the Water quality scientist at [place], A PA is not required for CPT Code: 32951 (Itamar-HST). Confirmation #: [id number]  Patient notified of PIN (1234) on 05/27/2023 Aleaha Fickling Notification Method: MyChart message. I did call the pt but got no answer so I left a message on the pts VM advising her that I have sent her a [facility] message and that if she any questions or concerns after reading it to contact us .  Phone note routed to covering staff for follow-up.

## 2023-05-27 NOTE — Progress Notes (Signed)
 Office Visit    Patient Name: Cynthia Patton Date of Encounter: 05/27/2023  Primary Care Provider:  Abraham Abo, MD Primary Cardiologist:  Cynthia Lapping, MD  Chief Complaint    63 year old female with a history of CAD s/p inferior STEMI, DES-RCA in 11/2022, hypertension, hyperlipidemia, diet-controlled diabetes, thyroid  cancer s/p thyroidectomy, and tobacco use who presents for follow-up related to CAD and hypertension.   Past Medical History    Past Medical History:  Diagnosis Date   Acute low back pain 03/25/2020   Anxiety    Cancer (HCC) 2000?   thyroid  cancer   Depression    Diabetes mellitus without complication (HCC)    Hyperlipidemia    Hypertension    Thyroid  disease    Past Surgical History:  Procedure Laterality Date   ABDOMINAL HYSTERECTOMY     BREAST CYST EXCISION Left    CHOLECYSTECTOMY     CORONARY/GRAFT ACUTE MI REVASCULARIZATION N/A 11/13/2022   Procedure: Coronary/Graft Acute MI Revascularization;  Surgeon: Cynthia Lapping, MD;  Location: [facility] INVASIVE CV LAB;  Service: Cardiovascular;  Laterality: N/A;   INCISE AND DRAIN ABCESS     on right leg    LEFT HEART CATH AND CORONARY ANGIOGRAPHY N/A 11/13/2022   Procedure: LEFT HEART CATH AND CORONARY ANGIOGRAPHY;  Surgeon: Cynthia Lapping, MD;  Location: [facility] INVASIVE CV LAB;  Service: Cardiovascular;  Laterality: N/A;   THYROIDECTOMY      Allergies  Allergies  Allergen Reactions   Floxin [Ocuflox] Other (See Comments)    Disoriented   Ofloxacin Other (See Comments)    Disoriented   Oxycodone -Acetaminophen      Other Reaction(s): Other (See Comments)  Hallucinations   Percocet [Oxycodone -Acetaminophen ]      Labs/Other Studies Reviewed    The following studies were reviewed today:  Cardiac Studies & Procedures   ______________________________________________________________________________________________ CARDIAC CATHETERIZATION  CARDIAC CATHETERIZATION 11/13/2022  Conclusion   Mid RCA  lesion is 100% stenosed.   Prox LAD to Mid LAD lesion is 30% stenosed.   Mid LM to Dist LM lesion is 25% stenosed.   Mid Cx lesion is 40% stenosed.   A drug-eluting stent was successfully placed using a STENT SYNERGY XD 3.0X32.   Post intervention, there is a 0% residual stenosis.   LV end diastolic pressure is moderately elevated.   Recommend uninterrupted dual antiplatelet therapy with Aspirin  81mg  daily and Ticagrelor  90mg  twice daily for a minimum of 12 months (ACS-Class I recommendation).  1.  Acute inferior MI secondary to mid RCA occlusion, treated successfully with primary PCI using a 3.0 x 32 mm Synergy DES.  Baseline 100% stenosis, TIMI 0 flow.  Post-PCI 0% residual stenosis and TIMI-3 flow. 2.  Mild nonobstructive plaquing of the left main, LAD, and left circumflex, without any high-grade stenoses in the left coronary distribution 3.  Moderately elevated LVEDP 24 mmHg  Recommendations: 2D echocardiogram for assessment of LV function Aggrastat  discontinued at completion of procedure DAPT with aspirin  and ticagrelor  for 12 months without interruption Aggressive risk modification with high intensity statin drug, goal LDL less than 55 Consider fast-track discharge tomorrow if criteria met  Findings Coronary Findings Diagnostic  Dominance: Right  Left Main Mid LM to Dist LM lesion is 25% stenosed.  Left Anterior Descending Prox LAD to Mid LAD lesion is 30% stenosed.  Left Circumflex Mid Cx lesion is 40% stenosed.  Right Coronary Artery Mid RCA lesion is 100% stenosed. Vessel is the culprit lesion. The lesion is type C and thrombotic.  Intervention  Mid  RCA lesion Stent A drug-eluting stent was successfully placed using a STENT SYNERGY XD 3.0X32. Post-stent angioplasty was performed using a BALLN [place] EMERGE MR 3.25X15. Maximum pressure:  14 atm. Post-Intervention Lesion Assessment The intervention was successful. Pre-interventional TIMI flow is 0. Post-intervention TIMI  flow is 3. No complications occurred at this lesion. There is a 0% residual stenosis post intervention.     ECHOCARDIOGRAM  ECHOCARDIOGRAM COMPLETE 11/14/2022  Narrative ECHOCARDIOGRAM REPORT    Patient Name:   Cynthia Patton Date of Exam: 11/14/2022 Medical Rec #:  [id number]      Height:       63.0 in Accession #:    [id number]     Weight:       214.9 lb Date of Birth:  07-03-1960       BSA:          1.994 m Patient Age:    62 years       BP:           157/68 mmHg Patient Gender: F              HR:           66 bpm. Exam Location:  Inpatient  Procedure: 2D Echo, Cardiac Doppler and Color Doppler  Indications:    I25.110 Atherosclerotic heart disease of native coronary artery with unstable angina pectoris  History:        Patient has no prior history of Echocardiogram examinations. CAD and Acute MI, Signs/Symptoms:Altered Mental Status; Risk Factors:Hypertension, Diabetes and Dyslipidemia.  Sonographer:    Cynthia Patton RDCS Referring Phys: [phone] Cynthia Patton   Sonographer Comments: Technically difficult study due to poor echo windows and patient is obese. Image acquisition challenging due to patient body habitus. Patient did not tolerate pressure of probe well. IMPRESSIONS   1. Left ventricular ejection fraction, by estimation, is 60 to 65%. The left ventricle has normal function. The left ventricle has no regional wall motion abnormalities. There is mild concentric left ventricular hypertrophy. Left ventricular diastolic parameters are consistent with Grade I diastolic dysfunction (impaired relaxation). Elevated left atrial pressure. 2. Right ventricular systolic function is normal. The right ventricular size is normal. Tricuspid regurgitation signal is inadequate for assessing PA pressure. 3. The mitral valve is normal in structure. No evidence of mitral valve regurgitation. No evidence of mitral stenosis. 4. The aortic valve is tricuspid. Aortic valve regurgitation is not  visualized. No aortic stenosis is present. 5. The inferior vena cava is normal in size with greater than 50% respiratory variability, suggesting right atrial pressure of 3 mmHg.  Comparison(s): No prior Echocardiogram.  FINDINGS Left Ventricle: Left ventricular ejection fraction, by estimation, is 60 to 65%. The left ventricle has normal function. The left ventricle has no regional wall motion abnormalities. The left ventricular internal cavity size was normal in size. There is mild concentric left ventricular hypertrophy. Left ventricular diastolic parameters are consistent with Grade I diastolic dysfunction (impaired relaxation). Elevated left atrial pressure.  Right Ventricle: The right ventricular size is normal. Right ventricular systolic function is normal. Tricuspid regurgitation signal is inadequate for assessing PA pressure. The tricuspid regurgitant velocity is 2.44 m/s, and with an assumed right atrial pressure of 3 mmHg, the estimated right ventricular systolic pressure is 26.8 mmHg.  Left Atrium: Left atrial size was normal in size.  Right Atrium: Right atrial size was normal in size.  Pericardium: There is no evidence of pericardial effusion.  Mitral Valve: The mitral valve is normal in  structure. No evidence of mitral valve regurgitation. No evidence of mitral valve stenosis.  Tricuspid Valve: The tricuspid valve is normal in structure. Tricuspid valve regurgitation is trivial. No evidence of tricuspid stenosis.  Aortic Valve: The aortic valve is tricuspid. Aortic valve regurgitation is not visualized. No aortic stenosis is present.  Pulmonic Valve: The pulmonic valve was normal in structure. Pulmonic valve regurgitation is not visualized. No evidence of pulmonic stenosis.  Aorta: The aortic root is normal in size and structure.  Venous: The inferior vena cava is normal in size with greater than 50% respiratory variability, suggesting right atrial pressure of 3  mmHg.  IAS/Shunts: No atrial level shunt detected by color flow Doppler.   LEFT VENTRICLE PLAX 2D LVIDd:         5.00 cm     Diastology LVIDs:         3.45 cm     LV e' medial:    3.59 cm/s LV PW:         1.30 cm     LV E/e' medial:  22.7 LV IVS:        1.25 cm     LV e' lateral:   4.68 cm/s LVOT diam:     2.20 cm     LV E/e' lateral: 17.4 LV SV:         73 LV SV Index:   37 LVOT Area:     3.80 cm  LV Volumes (MOD) LV vol d, MOD A2C: 72.4 ml LV vol d, MOD A4C: 77.3 ml LV vol s, MOD A2C: 28.2 ml LV vol s, MOD A4C: 38.8 ml LV SV MOD A2C:     44.2 ml LV SV MOD A4C:     77.3 ml LV SV MOD BP:      42.7 ml  RIGHT VENTRICLE             IVC RV S prime:     11.70 cm/s  IVC diam: 2.20 cm TAPSE (M-mode): 2.3 cm  LEFT ATRIUM             Index        RIGHT ATRIUM           Index LA diam:        3.30 cm 1.66 cm/m   RA Area:     11.60 cm LA Vol (A2C):   49.3 ml 24.73 ml/m  RA Volume:   24.10 ml  12.09 ml/m LA Vol (A4C):   54.3 ml 27.23 ml/m LA Biplane Vol: 52.7 ml 26.43 ml/m AORTIC VALVE LVOT Vmax:   89.10 cm/s LVOT Vmean:  59.200 cm/s LVOT VTI:    0.193 m  AORTA Ao Root diam: 2.70 cm Ao Asc diam:  3.10 cm  MITRAL VALVE                TRICUSPID VALVE MV Area (PHT): 3.72 cm     TR Peak grad:   23.8 mmHg MV Decel Time: 204 msec     TR Vmax:        244.00 cm/s MV E velocity: 81.55 cm/s MV A velocity: 107.50 cm/s  SHUNTS MV E/A ratio:  0.76         Systemic VTI:  0.19 m Systemic Diam: 2.20 cm  Alexandria Angel MD Electronically signed by Alexandria Angel MD Signature Date/Time: 11/14/2022/11:54:12 AM    Final          ______________________________________________________________________________________________     Recent Labs: 11/14/2022: Hemoglobin 12.1; Platelets  309; TSH 1.305 04/14/2023: ALT 8; BUN 18; Creatinine, Ser 0.96; Potassium 4.3; Sodium 142  Recent Lipid Panel    Component Value Date/Time   CHOL 193 04/14/2023 0857   TRIG 83 04/14/2023 0857   HDL  53 04/14/2023 0857   CHOLHDL 3.6 04/14/2023 0857   CHOLHDL 4.3 11/13/2022 1934   VLDL 28 11/13/2022 1934   LDLCALC 125 (H) 04/14/2023 0857    History of Present Illness    63 year old female with the above past medical history including CAD s/p inferior STEMI, DES-RCA in 11/2022, hypertension, hyperlipidemia, diet-controlled diabetes, thyroid  cancer s/p thyroidectomy, and tobacco use.   She was hospitalized in November 2024 in the setting of inferior STEMI.  Cardiology was consulted.  She underwent emergent cardiac catheterization which revealed 100% stenosis of the RCA, otherwise mild nonobstructive disease of the left main, LAD and left circumflex arteries, LVEDP was elevated at 24 mmHg.  She underwent successful PCI/DES-RCA.  Echocardiogram showed EF 60 to 65%, mild LVH, G1 DD.  She was started on DAPT with aspirin  and Brilinta .  She was later transitioned to Plavix .  She reported to concern for constipation with Plavix  and as a result was transitioned to Effient .  She has had difficulty with elevated BP, she was transitioned from losartan  to olmesartan , carvedilol  was increased to 12.5 mg twice daily.  Itamar sleep study was ordered given suspected sleep apnea.  She was last seen in the office on 04/14/2023 stable from a cardiac standpoint.  She denies symptoms concerning for angina.  She reported ongoing elevated BP.  She was started on amlodipine  5 mg daily.   She presents today for follow-up.  Since her last visit she has been stable from a cardiac standpoint.  She denies symptoms concerning for angina.  She has been walking, working on eating healthier.  BP is slightly elevated in office today; however, she did not take her blood pressure medication this morning.  She has not been checking her blood pressure consistently at home, but she reports that it has been generally well-controlled.  She continues to smoke. Overall, she reports feeling well.   Home Medications    Current Outpatient  Medications  Medication Sig Dispense Refill   amLODipine  (NORVASC ) 5 MG tablet Take 1 tablet (5 mg total) by mouth daily. 90 tablet 0   aspirin  81 MG chewable tablet Chew 1 tablet (81 mg total) by mouth daily. 90 tablet 3   atorvastatin  (LIPITOR) 80 MG tablet Take 1 tablet (80 mg total) by mouth daily. 30 tablet 2   blood glucose meter kit and supplies KIT Dispense based on patient and insurance preference. Use up to four times daily as directed. (FOR ICD-9 250.00, 250.01). 1 each 0   carvedilol  (COREG ) 12.5 MG tablet Take 1 tablet (12.5 mg total) by mouth 2 (two) times daily. 60 tablet 3   fluticasone  (FLONASE ) 50 MCG/ACT nasal spray Place 2 sprays into both nostrils daily. (Patient taking differently: Place 2 sprays into both nostrils as needed.) 16 g 6   levothyroxine  (SYNTHROID ) 175 MCG tablet Take 175 mcg by mouth daily before breakfast.     losartan  (COZAAR ) 100 MG tablet Take 100 mg by mouth daily.     mirtazapine  (REMERON ) 7.5 MG tablet Take 1 tablet (7.5 mg total) by mouth at bedtime. 30 tablet 1   mometasone  (NASONEX ) 50 MCG/ACT nasal spray Place 2 sprays into the nose daily. (Patient taking differently: Place 2 sprays into the nose as needed.) 17 g 1  nicotine  (NICODERM CQ  - DOSED IN MG/24 HOURS) 14 mg/24hr patch Place 14 mg onto the skin daily.     nitroGLYCERIN  (NITROSTAT ) 0.4 MG SL tablet Place 1 tablet (0.4 mg total) under the tongue every 5 (five) minutes as needed for chest pain. 25 tablet 2   olmesartan  (BENICAR ) 40 MG tablet Take 1 tablet (40 mg total) by mouth daily. 30 tablet 6   ONETOUCH VERIO test strip      prasugrel  (EFFIENT ) 10 MG TABS tablet Take by mouth.     No current facility-administered medications for this visit.     Review of Systems    She denies chest pain, palpitations, dyspnea, pnd, orthopnea, n, v, dizziness, syncope, edema, weight gain, or early satiety. All other systems reviewed and are otherwise negative except as noted above.   Physical Exam     VS:  BP (!) 146/62   Pulse 73   Ht 5' 3.5" (1.613 m)   Wt 225 lb (102.1 kg)   SpO2 94%   BMI 39.23 kg/m   GEN: Well nourished, well developed, in no acute distress. HEENT: normal. Neck: Supple, no JVD, carotid bruits, or masses. Cardiac: RRR, no murmurs, rubs, or gallops. No clubbing, cyanosis, edema.  Radials/DP/PT 2+ and equal bilaterally.  Respiratory:  Respirations regular and unlabored, clear to auscultation bilaterally. GI: Soft, nontender, nondistended, BS + x 4. MS: no deformity or atrophy. Skin: warm and dry, no rash. Neuro:  Strength and sensation are intact. Psych: Normal affect.  Accessory Clinical Findings    ECG personally reviewed by me today - EKG Interpretation Date/Time:  Friday May 27 2023 08:19:03 EDT Ventricular Rate:  73 PR Interval:  160 QRS Duration:  92 QT Interval:  374 QTC Calculation: 412 R Axis:   87  Text Interpretation: Normal sinus rhythm T wave abnormality, consider inferior ischemia When compared with ECG of 23-Nov-2022 14:42, No significant change was found Confirmed by Marlana Silvan ([id number]) on 05/27/2023 8:24:10 AM  - no acute changes.   Lab Results  Component Value Date   WBC 9.8 11/14/2022   HGB 12.1 11/14/2022   HCT 36.9 11/14/2022   MCV 92.7 11/14/2022   PLT 309 11/14/2022   Lab Results  Component Value Date   CREATININE 0.96 04/14/2023   BUN 18 04/14/2023   NA 142 04/14/2023   K 4.3 04/14/2023   CL 105 04/14/2023   CO2 25 04/14/2023   Lab Results  Component Value Date   ALT 8 04/14/2023   AST 10 04/14/2023   ALKPHOS 84 04/14/2023   BILITOT 0.3 04/14/2023   Lab Results  Component Value Date   CHOL 193 04/14/2023   HDL 53 04/14/2023   LDLCALC 125 (H) 04/14/2023   TRIG 83 04/14/2023   CHOLHDL 3.6 04/14/2023    Lab Results  Component Value Date   HGBA1C 6.8 (H) 11/13/2022    Assessment & Plan   1. CAD: S/p inferior STEMI, DES-RCA in 11/2022. Echocardiogram showed EF 60 to 65%, mild LVH, G1 DD. Stable with no  anginal symptoms. No indication for ischemic evaluation. Continue ASA, Effient , Olmesartan , carvedilol , amlodipine , and Lipitor.    2. Hypertension: BP  elevated in office today, has been generally well controlled since starting amlodipine . She did not take her medicine prior to today's visit. Continue to monitor BP report BP consistently greater than 130/80.  Continue current antihypertensive regimen.    3. Hyperlipidemia: LDL was 125 in 04/2023. She states that she was fasting for her recent lab  work.  Given this ongoing elevated LDL despite high intensity statin therapy, will refer to lipid clinic Pharm.D. for consideration of PCSK9 inhibitor. Continue Lipitor.  4. Type 2 diabetes: A1c was 6.3 in 01/2023, diet controlled.   Encouraged increased activity as tolerated.  She notes she previously took Ozempic, tolerated this well.  She states she plans to discuss possible restarting Ozempic with her endocrinologist.   5. Tobacco use: She continues to smoke. Full cessation advised.    6.  History of snoring/Sleep disordered breathing: Pending Itamar sleep study. She has the test at home, encouraged her to complete it.    7. Disposition: Follow-up in 4 to 6 months.  Jude Norton, NP 05/27/2023, 8:24 AM

## 2023-05-29 ENCOUNTER — Encounter: Payer: Self-pay | Admitting: Nurse Practitioner

## 2023-06-19 ENCOUNTER — Ambulatory Visit
Admission: EM | Admit: 2023-06-19 | Discharge: 2023-06-19 | Disposition: A | Discharge status: Home / Self Care | Attending: Physician Assistant | Admitting: Physician Assistant

## 2023-06-19 DIAGNOSIS — R058 Other specified cough: Secondary | ICD-10-CM | POA: Diagnosis not present

## 2023-06-19 MED ORDER — AMOXICILLIN-POT CLAVULANATE 875-125 MG PO TABS: 1.0000 | ORAL_TABLET | Freq: Two times a day (BID) | ORAL | 0 refills | Status: DC

## 2023-06-19 NOTE — ED Provider Notes (Signed)
 [facility]    CSN: [id number] Arrival date & time: 06/19/23  1412      History   Chief Complaint Chief Complaint  Patient presents with   Nasal Congestion    HPI Cynthia Patton is a 63 y.o. female.   Patient here today for evaluation of cough, congestion that started about 5 days ago.  She reports that she has not had shortness of breath but has had some sweats at night and thinks she has had fever.  She denies any chest pain.  She notes cough is productive.  She has had some nausea from drainage and mucus but denies any vomiting.  She has not taken any over-the-counter medication because of recent MI she is concerned about side effects of same.  The history is provided by the patient.    Past Medical History:  Diagnosis Date   Acute low back pain 03/25/2020   Anxiety    Cancer (HCC) 2000?   thyroid  cancer   Depression    Diabetes mellitus without complication (HCC)    Hyperlipidemia    Hypertension    Thyroid  disease     Patient Active Problem List   Diagnosis Date Noted   Erroneous encounter - disregard 06/19/2023   Other specified acquired deformities of left lower leg 02/17/2023   Severe obesity (BMI 35.0-39.9) with comorbidity (HCC) 01/24/2023   CAD (coronary artery disease) 11/14/2022   Tobacco abuse 11/14/2022   STEMI involving right coronary artery (HCC) 11/13/2022   Eczema 09/10/2021   Rash 06/30/2021   Renal cyst 04/30/2021   Tinnitus of both ears 04/09/2021   Moderate episode of recurrent major depressive disorder (HCC) 02/09/2021   History of COVID-19 10/09/2020   Cough 10/09/2020   Postviral fatigue syndrome 10/09/2020   Nonintractable headache 10/09/2020   Memory loss 10/09/2020   Postoperative hypothyroidism 07/28/2020   Thyroid  cancer (HCC) 04/23/2019   Hemoglobin A1C between 7% and 9% indicating borderline diabetic control (HCC) 10/18/2018   Positive ANA (antinuclear antibody) 04/06/2016   Hyperlipidemia 03/19/2016   Controlled  type 2 diabetes mellitus with complication, without long-term current use of insulin (HCC) 02/09/2016   Essential hypertension 02/09/2016   Health care maintenance 02/24/2015    Past Surgical History:  Procedure Laterality Date   ABDOMINAL HYSTERECTOMY     BREAST CYST EXCISION Left    CHOLECYSTECTOMY     CORONARY/GRAFT ACUTE MI REVASCULARIZATION N/A 11/13/2022   Procedure: Coronary/Graft Acute MI Revascularization;  Surgeon: Arnoldo Lapping, MD;  Location: [facility] INVASIVE CV LAB;  Service: Cardiovascular;  Laterality: N/A;   INCISE AND DRAIN ABCESS     on right leg    LEFT HEART CATH AND CORONARY ANGIOGRAPHY N/A 11/13/2022   Procedure: LEFT HEART CATH AND CORONARY ANGIOGRAPHY;  Surgeon: Arnoldo Lapping, MD;  Location: [facility] INVASIVE CV LAB;  Service: Cardiovascular;  Laterality: N/A;   THYROIDECTOMY      OB History     Gravida  0   Para  0   Term  0   Preterm  0   AB  0   Living  0      SAB  0   IAB  0   Ectopic  0   Multiple  0   Live Births  0            Home Medications    Prior to Admission medications   Medication Sig Start Date End Date Taking? Authorizing Provider  amLODipine  (NORVASC ) 5 MG tablet Take 1 tablet (5  mg total) by mouth daily. 05/17/23  Yes Abraham Abo, MD  amoxicillin -clavulanate (AUGMENTIN ) 875-125 MG tablet Take 1 tablet by mouth every 12 (twelve) hours. 06/19/23  Yes Vernestine Gondola, PA-C  aspirin  81 MG chewable tablet Chew 1 tablet (81 mg total) by mouth daily. 12/01/22  Yes Goodrich, Callie E, PA-C  atorvastatin  (LIPITOR) 80 MG tablet Take 1 tablet (80 mg total) by mouth daily. 11/15/22  Yes Goodrich, Callie E, PA-C  carvedilol  (COREG ) 12.5 MG tablet Take 1 tablet (12.5 mg total) by mouth 2 (two) times daily. 02/21/23  Yes Goodrich, Callie E, PA-C  fluticasone  (FLONASE ) 50 MCG/ACT nasal spray Place 2 sprays into both nostrils daily. Patient taking differently: Place 2 sprays into both nostrils as needed. 04/09/21  Yes Jerrlyn Morel, NP   levothyroxine  (SYNTHROID ) 175 MCG tablet Take 175 mcg by mouth daily before breakfast. 01/14/20  Yes [provider]  losartan  (COZAAR ) 100 MG tablet Take 100 mg by mouth daily. 02/23/23  Yes [provider]  mirtazapine  (REMERON ) 7.5 MG tablet Take 1 tablet (7.5 mg total) by mouth at bedtime. 05/17/23  Yes Abraham Abo, MD  mometasone  (NASONEX ) 50 MCG/ACT nasal spray Place 2 sprays into the nose daily. Patient taking differently: Place 2 sprays into the nose as needed. 10/14/16  Yes Sigurd Driver, FNP  olmesartan  (BENICAR ) 40 MG tablet Take 1 tablet (40 mg total) by mouth daily. 02/21/23  Yes Goodrich, Callie E, PA-C  prasugrel  (EFFIENT ) 10 MG TABS tablet Take by mouth. 03/24/23  Yes [provider]  blood glucose meter kit and supplies KIT Dispense based on patient and insurance preference. Use up to four times daily as directed. (FOR ICD-9 250.00, 250.01). 07/07/18   Emmie Harness, FNP  nicotine  (NICODERM CQ  - DOSED IN MG/24 HOURS) 14 mg/24hr patch Place 14 mg onto the skin daily. 11/29/22   [provider]  nitroGLYCERIN  (NITROSTAT ) 0.4 MG SL tablet Place 1 tablet (0.4 mg total) under the tongue every 5 (five) minutes as needed for chest pain. 11/14/22   Goodrich, Callie E, PA-C  ONETOUCH VERIO test strip  10/22/18   [provider]    Family History Family History  Problem Relation Age of Onset   Kidney disease Mother    Hypertension Mother    Hypertension Sister    Heart disease Brother    Colon cancer Neg Hx    Colon polyps Neg Hx    Esophageal cancer Neg Hx    Rectal cancer Neg Hx    Stomach cancer Neg Hx     Social History Social History   Tobacco Use   Smoking status: Every Day    Current packs/day: 0.50    Types: Cigarettes   Smokeless tobacco: Never  Vaping Use   Vaping status: Never Used  Substance Use Topics   Alcohol use: No   Drug use: No     Allergies   Floxin [ocuflox], Ofloxacin, Oxycodone -acetaminophen , and  Percocet [oxycodone -acetaminophen ]   Review of Systems Review of Systems  Constitutional:  Positive for diaphoresis and fever (subjective).  HENT:  Positive for congestion. Negative for ear pain and sore throat.   Eyes:  Negative for discharge and redness.  Respiratory:  Positive for cough. Negative for shortness of breath and wheezing.   Gastrointestinal:  Negative for abdominal pain, diarrhea, nausea and vomiting.     Physical Exam Triage Vital Signs ED Triage Vitals  Encounter Vitals Group     BP      Systolic BP  Percentile      Diastolic BP Percentile      Pulse      Resp      Temp      Temp src      SpO2      Weight      Height      Head Circumference      Peak Flow      Pain Score      Pain Loc      Pain Education      Exclude from Growth Chart    No data found.  Updated Vital Signs BP (!) 154/67 (BP Location: Right Arm)   Pulse 76   Temp 98.4 F (36.9 C) (Oral)   Resp 18   Ht 5' 3.5" (1.613 m)   Wt 225 lb 1.4 oz (102.1 kg)   SpO2 96%   BMI 39.25 kg/m   Visual Acuity Right Eye Distance:   Left Eye Distance:   Bilateral Distance:    Right Eye Near:   Left Eye Near:    Bilateral Near:     Physical Exam Vitals and nursing note reviewed.  Constitutional:      General: She is not in acute distress.    Appearance: Normal appearance. She is not ill-appearing.  HENT:     Head: Normocephalic and atraumatic.     Nose: Congestion present.     Mouth/Throat:     Mouth: Mucous membranes are moist.     Pharynx: No oropharyngeal exudate or posterior oropharyngeal erythema.  Eyes:     Conjunctiva/sclera: Conjunctivae normal.  Cardiovascular:     Rate and Rhythm: Normal rate and regular rhythm.     Heart sounds: Normal heart sounds. No murmur heard. Pulmonary:     Effort: Pulmonary effort is normal. No respiratory distress.     Breath sounds: Normal breath sounds. No wheezing, rhonchi or rales.     Comments: Deep breathing produces productive  cough Skin:    General: Skin is warm and dry.  Neurological:     Mental Status: She is alert.  Psychiatric:        Mood and Affect: Mood normal.        Thought Content: Thought content normal.      UC Treatments / Results  Labs (all labs ordered are listed, but only abnormal results are displayed) Labs Reviewed - No data to display  EKG   Radiology No results found.  Procedures Procedures (including critical care time)  Medications Ordered in UC Medications - No data to display  Initial Impression / Assessment and Plan / UC Course  I have reviewed the triage vital signs and the nursing notes.  Pertinent labs & imaging results that were available during my care of the patient were reviewed by me and considered in my medical decision making (see chart for details).    Given productive cough with subjective fever and past medical history will treat with antibiotics to cover possible bacterial cause of symptoms.  Augmentin  prescribed and advised follow-up if no gradual improvement or with any worsening symptoms.  Final Clinical Impressions(s) / UC Diagnoses   Final diagnoses:  Productive cough   Discharge Instructions   None    ED Prescriptions     Medication Sig Dispense Auth. Provider   amoxicillin -clavulanate (AUGMENTIN ) 875-125 MG tablet Take 1 tablet by mouth every 12 (twelve) hours. 14 tablet Vernestine Gondola, PA-C      PDMP not reviewed this encounter.   Aisha Ali,  Vara Gentle, PA-C 06/19/23 1544

## 2023-06-19 NOTE — ED Triage Notes (Signed)
"  This started with feeling bad, rattling cough, sinus drainage, that started Thursday". No sob. No chest pain. No fever known.

## 2023-06-20 NOTE — Telephone Encounter (Signed)
 mirtazapine  (REMERON ) 7.5 MG tablet  Patient insurance require 90 days

## 2023-06-21 ENCOUNTER — Encounter: Payer: Self-pay | Admitting: Family Medicine

## 2023-06-21 NOTE — Progress Notes (Unsigned)
 Patient ID: Cynthia Patton, female   DOB: January 30, 1960, 63 y.o.   MRN: [id number]  Last seen by cardiology 05/27/2023/from A/P: 1. CAD: S/p inferior STEMI, DES-RCA in 11/2022. Echocardiogram showed EF 60 to 65%, mild LVH, G1 DD. Stable with no anginal symptoms. No indication for ischemic evaluation. Continue ASA, Effient , Olmesartan , carvedilol , amlodipine , and Lipitor.    2. Hypertension: BP  elevated in office today, has been generally well controlled since starting amlodipine . She did not take her medicine prior to today's visit. Continue to monitor BP report BP consistently greater than 130/80.  Continue current antihypertensive regimen.    3. Hyperlipidemia: LDL was 125 in 04/2023. She states that she was fasting for her recent lab work.  Given this ongoing elevated LDL despite high intensity statin therapy, will refer to lipid clinic Pharm.D. for consideration of PCSK9 inhibitor. Continue Lipitor.   4. Type 2 diabetes: A1c was 6.3 in 01/2023, diet controlled.   Encouraged increased activity as tolerated.  She notes she previously took Ozempic, tolerated this well.  She states she plans to discuss possible restarting Ozempic with her endocrinologist.   5. Tobacco use: She continues to smoke. Full cessation advised.    6.  History of snoring/Sleep disordered breathing: Pending Itamar sleep study. She has the test at home, encouraged her to complete it.    7. Disposition: Follow-up in 4 to 6 months.   Jude Norton, NP 05/27/2023, 8:24 AM

## 2023-06-22 ENCOUNTER — Encounter: Payer: Self-pay | Admitting: Physician Assistant

## 2023-06-22 ENCOUNTER — Ambulatory Visit (INDEPENDENT_AMBULATORY_CARE_PROVIDER_SITE_OTHER)

## 2023-06-22 ENCOUNTER — Ambulatory Visit (INDEPENDENT_AMBULATORY_CARE_PROVIDER_SITE_OTHER): Admitting: Physician Assistant

## 2023-06-22 VITALS — BP 151/84 | HR 73 | Temp 97.2°F

## 2023-06-22 DIAGNOSIS — R059 Cough, unspecified: Secondary | ICD-10-CM

## 2023-06-22 DIAGNOSIS — J069 Acute upper respiratory infection, unspecified: Secondary | ICD-10-CM | POA: Diagnosis not present

## 2023-06-22 DIAGNOSIS — Z09 Encounter for follow-up examination after completed treatment for conditions other than malignant neoplasm: Secondary | ICD-10-CM

## 2023-06-22 MED ORDER — PROMETHAZINE-DM 6.25-15 MG/5ML PO SYRP: 5.0000 mL | ORAL_SOLUTION | Freq: Four times a day (QID) | ORAL | 0 refills | Status: DC | PRN

## 2023-06-22 NOTE — Patient Instructions (Signed)
 Drink 64 to 80 ounces water daily.  Continue the antibiotic.

## 2023-06-24 ENCOUNTER — Ambulatory Visit: Admitting: Family Medicine

## 2023-06-27 ENCOUNTER — Ambulatory Visit: Payer: Self-pay | Admitting: Physician Assistant

## 2023-07-04 ENCOUNTER — Telehealth: Payer: Self-pay | Admitting: *Deleted

## 2023-07-04 NOTE — Telephone Encounter (Signed)
 I have attempted without success to contact this patient by phone to return their call and I left a message on answering machine.

## 2023-07-05 ENCOUNTER — Other Ambulatory Visit: Payer: Self-pay | Admitting: Family Medicine

## 2023-07-05 ENCOUNTER — Ambulatory Visit: Payer: Self-pay

## 2023-07-05 ENCOUNTER — Telehealth: Payer: Self-pay | Admitting: Family Medicine

## 2023-07-05 MED ORDER — FLUCONAZOLE 150 MG PO TABS: 150.0000 mg | ORAL_TABLET | Freq: Once | ORAL | 0 refills | Status: AC

## 2023-07-05 NOTE — Telephone Encounter (Signed)
 Copied from CRM [id number]. Topic: Clinical - Medical Advice >> Jul 04, 2023 12:48 PM DeAngela L wrote: Reason for CRM: Patient calling to for yeast infection she took anti biotics a week ago and says she really needs the medication to get some relief soon Pt num  [phone] (M)  Leave a detailed message in a text [place] good service

## 2023-07-05 NOTE — Telephone Encounter (Signed)
 This RN made first attempt to triage patient (at 2:45pm). No answer, LVM. Routing for additional attempts.   Patient has had a yeast infection for 4 days and is a heart patient. She would like medication called in to preferred pharmacy. Patient would like a call back around 2:45p during her next break at work or after 4:30 p when she gets off work   Occupational psychologist #: [phone]   Preferred Pharmacy:  [facility] [address] ([address]),  - [address]  [address] [place] ([place]) [place] [address]  Phone: [phone] Fax: [phone]  Hours: Not open 24 hours

## 2023-07-05 NOTE — Telephone Encounter (Signed)
 Copied from CRM [id number]. Topic: General - Call Back - No Documentation >> Jul 05, 2023 12:44 PM Wess RAMAN wrote: Reason for CRM: Patient missed a call from Celestia Gustav BRAVO, [place] and would like a callback  Callback #: [phone]

## 2023-07-05 NOTE — Telephone Encounter (Signed)
 Attempted to speak to patient regarding original call symptoms.   Patient audibly upset upon transfer.  I don't get this process. Why do I have to speak with a triage nurse? This is crazy.   This nurse attempted to explain the process and offered triage.  Patient declined triage and abruptly ended the call.   Message routed appropriately.

## 2023-07-14 ENCOUNTER — Other Ambulatory Visit: Payer: Self-pay | Admitting: Student

## 2023-07-19 ENCOUNTER — Encounter: Payer: Self-pay | Admitting: Pharmacist

## 2023-07-19 ENCOUNTER — Other Ambulatory Visit (HOSPITAL_COMMUNITY): Payer: Self-pay

## 2023-07-19 ENCOUNTER — Ambulatory Visit: Attending: Cardiology | Admitting: Pharmacist

## 2023-07-19 DIAGNOSIS — E785 Hyperlipidemia, unspecified: Secondary | ICD-10-CM

## 2023-07-19 MED ORDER — PRASUGREL HCL 10 MG PO TABS
10.0000 mg | ORAL_TABLET | Freq: Every day | ORAL | 3 refills | Status: DC
  Filled 2023-07-19 (×2): qty 90, 90d supply, fill #0

## 2023-07-19 MED ORDER — ATORVASTATIN CALCIUM 80 MG PO TABS
80.0000 mg | ORAL_TABLET | Freq: Every day | ORAL | 3 refills | Status: AC
Start: 2023-07-19 — End: ?
  Filled 2023-07-19: qty 90, 90d supply, fill #0
  Filled 2023-11-09: qty 30, 30d supply, fill #1
  Filled 2023-12-27: qty 30, 30d supply, fill #2

## 2023-07-19 MED ORDER — OLMESARTAN MEDOXOMIL 40 MG PO TABS
40.0000 mg | ORAL_TABLET | Freq: Every day | ORAL | 6 refills | Status: DC
  Filled 2023-07-19: qty 30, 30d supply, fill #0

## 2023-07-19 MED ORDER — CARVEDILOL 12.5 MG PO TABS
12.5000 mg | ORAL_TABLET | Freq: Two times a day (BID) | ORAL | 3 refills | Status: DC
  Filled 2023-07-19: qty 60, 30d supply, fill #0
  Filled 2023-08-29: qty 60, 30d supply, fill #1
  Filled 2023-09-27: qty 60, 30d supply, fill #2
  Filled 2023-09-27 – 2023-09-29 (×2): qty 60, 30d supply, fill #0

## 2023-07-19 MED ORDER — EZETIMIBE 10 MG PO TABS
10.0000 mg | ORAL_TABLET | Freq: Every day | ORAL | 3 refills | Status: DC
  Filled 2023-07-19: qty 90, 90d supply, fill #0

## 2023-07-19 MED ORDER — CLOPIDOGREL BISULFATE 75 MG PO TABS: 75.0000 mg | ORAL_TABLET | Freq: Every day | ORAL | 3 refills | Status: DC

## 2023-07-19 MED ORDER — AMLODIPINE BESYLATE 5 MG PO TABS
5.0000 mg | ORAL_TABLET | Freq: Every day | ORAL | 0 refills | Status: DC
  Filled 2023-07-19 – 2023-08-29 (×2): qty 90, 90d supply, fill #0

## 2023-07-19 MED ORDER — OLMESARTAN MEDOXOMIL 40 MG PO TABS
40.0000 mg | ORAL_TABLET | Freq: Every day | ORAL | 6 refills | Status: DC
  Filled 2023-08-29: qty 30, 30d supply, fill #0

## 2023-07-19 MED ORDER — PRASUGREL HCL 10 MG PO TABS
10.0000 mg | ORAL_TABLET | Freq: Every day | ORAL | 3 refills | Status: AC
  Filled 2023-12-27: qty 90, 90d supply, fill #0
  Filled 2024-01-03: qty 30, 30d supply, fill #0

## 2023-07-19 NOTE — Assessment & Plan Note (Signed)
 Assessment:  LDL goal: <55 mg/dl last LDLc 874 mg/dl (95/7974) Non compliance to statins - not knowing indication for each meds that she is on  Was on Lipitor 80 gm for about a month post MI in Nov 2024  Pharmacy switched to [facility] pharmacy to improve adherence  Med list simplified and patient has been educated for each indication   Plan: Start taking Lipitor 80 mg daily and ezetimibe  10 mg daily  Will repeat lab end of September to see the response  In future may optimize therapy with PCSK9i if LDL remains above the goal

## 2023-07-19 NOTE — Progress Notes (Signed)
 Patient ID: Cynthia Patton                 DOB: Sep 24, 1960                    MRN: [id number]      HPI: Cynthia Patton is a 63 y.o. female patient referred to lipid clinic by Damien Braver, NP. PMH is significant for CAD s/p inferior STEMI, DES-RCA in 11/2022, hypertension, hyperlipidemia, diet-controlled diabetes, thyroid  cancer s/p thyroidectomy, and tobacco use   The patient was referred to the lipid clinic after her LDL was found to be 125 mg/dL in April 7974. At that time, it was unclear whether she was taking a statin. During today's visit, the patient brought in all of her current medications, and atorvastatin  was not among them. She stated that she has not been on a statin and is unsure of what medications she is currently prescribed. Review of her pharmacy fill history showed that she filled atorvastatin  only once for 30 days following a cardiovascular event in November 2024, after which it appears the medication was not continued. She also reported ongoing difficulties with her current pharmacy, particularly with obtaining timely refills. To help address this issue, she agreed to transfer all her prescriptions to the [facility]. The patient does not recall experiencing any side effects while previously taking atorvastatin . Today, she will be restarted on atorvastatin  80 mg daily and initiated on ezetimibe  (Zetia ) 10 mg daily. A follow-up lipid panel will be ordered to assess her response to high-intensity statin therapy.  Current Medications: Lipitor 80 mg daily , Zetia  10 mg daily  Intolerances: none  Risk Factors: Premature ASCVD,CAD s/p inferior STEMI, DES-RCA in 11/2022, hypertension, hyperlipidemia, diet-controlled diabetes, tobacco use  LDL goal: <55 mg/dl  Last lipid lab 95/7974: TC 193, TG 83, HDL 53, LDL 125  Diet: low fat, low salt diet   Exercise: none   Family History:  Relation Problem Comments  Mother (Deceased) Hypertension   Kidney disease     Father (Deceased)    Sister Metallurgist) Hypertension     Brother (Alive) Heart disease      Labs:  Lipid Panel     Component Value Date/Time   CHOL 193 04/14/2023 0857   TRIG 83 04/14/2023 0857   HDL 53 04/14/2023 0857   CHOLHDL 3.6 04/14/2023 0857   CHOLHDL 4.3 11/13/2022 1934   VLDL 28 11/13/2022 1934   LDLCALC 125 (H) 04/14/2023 0857   LABVLDL 15 04/14/2023 0857    Past Medical History:  Diagnosis Date   Acute low back pain 03/25/2020   Anxiety    Cancer (HCC) 2000?   thyroid  cancer   Depression    Diabetes mellitus without complication (HCC)    Hyperlipidemia    Hypertension    Thyroid  disease     Current Outpatient Medications on File Prior to Visit  Medication Sig Dispense Refill   aspirin  81 MG chewable tablet Chew 1 tablet (81 mg total) by mouth daily. 90 tablet 3   blood glucose meter kit and supplies KIT Dispense based on patient and insurance preference. Use up to four times daily as directed. (FOR ICD-9 250.00, 250.01). 1 each 0   levothyroxine  (SYNTHROID ) 175 MCG tablet Take 175 mcg by mouth daily before breakfast.     mirtazapine  (REMERON ) 7.5 MG tablet Take 1 tablet (7.5 mg total) by mouth at bedtime. 30 tablet 1   nitroGLYCERIN  (NITROSTAT ) 0.4 MG SL tablet Place 1 tablet (  0.4 mg total) under the tongue every 5 (five) minutes as needed for chest pain. 25 tablet 2   ONETOUCH VERIO test strip      fluticasone  (FLONASE ) 50 MCG/ACT nasal spray Place 2 sprays into both nostrils daily. (Patient taking differently: Place 2 sprays into both nostrils as needed.) 16 g 6   No current facility-administered medications on file prior to visit.    Allergies  Allergen Reactions   Floxin [Ocuflox] Other (See Comments)    Disoriented   Ofloxacin Other (See Comments)    Disoriented   Oxycodone -Acetaminophen      Other Reaction(s): Other (See Comments)  Hallucinations   Percocet [Oxycodone -Acetaminophen ]     Assessment/Plan:  1. Hyperlipidemia -  Problem  Hyperlipidemia    Hyperlipidemia Assessment:  LDL goal: <55 mg/dl last LDLc 874 mg/dl (95/7974) Non compliance to statins - not knowing indication for each meds that she is on  Was on Lipitor 80 gm for about a month post MI in Nov 2024  Pharmacy switched to [facility] pharmacy to improve adherence  Med list simplified and patient has been educated for each indication   Plan: Start taking Lipitor 80 mg daily and ezetimibe  10 mg daily  Will repeat lab end of September to see the response  In future may optimize therapy with PCSK9i if LDL remains above the goal      Thank you,  Robbi Blanch, Pharm.D [facility]. [facility] [address], [place], [place] [address] Phone: [phone]; Fax: [phone]

## 2023-07-19 NOTE — Patient Instructions (Addendum)
 Your Results:             Your most recent labs Goal  Total Cholesterol 193 < 200  Triglycerides 83 < 150  HDL (happy/good cholesterol) 53 > 40  LDL (lousy/bad cholesterol 125 < 55   Medication changes: Restart atorvastatin  80 mg daily and start taking ezetimibe  10 mg daily  Will re check lipid and liver function test.  Prescription to pick up today from pharmacy  Prasugrel , carvedilol , atorvastatin  and ezetimibe .   Get your PCP to send prescriptions for mirtazapine  and levothyroxine  to [facility].

## 2023-08-01 ENCOUNTER — Other Ambulatory Visit (HOSPITAL_COMMUNITY): Payer: Self-pay

## 2023-08-01 MED ORDER — LEVOTHYROXINE SODIUM 175 MCG PO TABS
175.0000 ug | ORAL_TABLET | Freq: Every day | ORAL | 5 refills | Status: DC
  Filled 2023-08-01: qty 30, 32d supply, fill #0
  Filled 2023-08-19 – 2023-09-27 (×2): qty 30, 30d supply, fill #0
  Filled 2023-09-27: qty 30, 30d supply, fill #1
  Filled 2023-09-29: qty 30, 30d supply, fill #0

## 2023-08-02 ENCOUNTER — Ambulatory Visit (INDEPENDENT_AMBULATORY_CARE_PROVIDER_SITE_OTHER): Admitting: Family Medicine

## 2023-08-02 ENCOUNTER — Encounter: Payer: Self-pay | Admitting: Family Medicine

## 2023-08-02 VITALS — BP 153/86 | HR 72 | Ht 63.0 in | Wt 225.0 lb

## 2023-08-02 DIAGNOSIS — I10 Essential (primary) hypertension: Secondary | ICD-10-CM

## 2023-08-02 DIAGNOSIS — F419 Anxiety disorder, unspecified: Secondary | ICD-10-CM

## 2023-08-02 DIAGNOSIS — F418 Other specified anxiety disorders: Secondary | ICD-10-CM

## 2023-08-02 DIAGNOSIS — J42 Unspecified chronic bronchitis: Secondary | ICD-10-CM | POA: Diagnosis not present

## 2023-08-02 DIAGNOSIS — F17210 Nicotine dependence, cigarettes, uncomplicated: Secondary | ICD-10-CM | POA: Diagnosis not present

## 2023-08-02 NOTE — Progress Notes (Unsigned)
 Established Patient Office Visit  Subjective    Patient ID: Cynthia Patton, female    DOB: 10-09-1960  Age: 63 y.o. MRN: [id number]  CC:  Chief Complaint  Patient presents with   Medical Management of Chronic Issues    Pt reports she feels like she is catching pneumonia again     HPI Cynthia Patton presents for complaint of possible pneumonia. She reports that she sleeps in a room without air and that she awakes drenched in sweat. Patient is a smoker. She also has a history of MI.    Outpatient Encounter Medications as of 08/02/2023  Medication Sig   amLODipine  (NORVASC ) 5 MG tablet Take 1 tablet (5 mg total) by mouth daily.   aspirin  81 MG chewable tablet Chew 1 tablet (81 mg total) by mouth daily.   atorvastatin  (LIPITOR) 80 MG tablet Take 1 tablet (80 mg total) by mouth daily.   blood glucose meter kit and supplies KIT Dispense based on patient and insurance preference. Use up to four times daily as directed. (FOR ICD-9 250.00, 250.01).   carvedilol  (COREG ) 12.5 MG tablet Take 1 tablet (12.5 mg total) by mouth 2 (two) times daily.   clopidogrel  (PLAVIX ) 75 MG tablet Take 1 tablet (75 mg total) by mouth daily.   ezetimibe  (ZETIA ) 10 MG tablet Take 1 tablet (10 mg total) by mouth daily.   fluticasone  (FLONASE ) 50 MCG/ACT nasal spray Place 2 sprays into both nostrils daily. (Patient taking differently: Place 2 sprays into both nostrils as needed.)   levothyroxine  (SYNTHROID ) 175 MCG tablet Take 175 mcg by mouth daily before breakfast.   levothyroxine  (SYNTHROID ) 175 MCG tablet Take 1 tablet (175 mcg) by mouth daily for 6 days then 1/2 (87.5 mcg) one day a week.   mirtazapine  (REMERON ) 7.5 MG tablet Take 1 tablet (7.5 mg total) by mouth at bedtime.   nitroGLYCERIN  (NITROSTAT ) 0.4 MG SL tablet Place 1 tablet (0.4 mg total) under the tongue every 5 (five) minutes as needed for chest pain.   olmesartan  (BENICAR ) 40 MG tablet Take 1 tablet (40 mg total) by mouth daily.   olmesartan   (BENICAR ) 40 MG tablet Take 1 tablet (40 mg total) by mouth daily.   ONETOUCH VERIO test strip    prasugrel  (EFFIENT ) 10 MG TABS tablet Take 1 tablet (10 mg total) by mouth daily.   prasugrel  (EFFIENT ) 10 MG TABS tablet Take 1 tablet (10 mg total) by mouth daily.   No facility-administered encounter medications on file as of 08/02/2023.    Past Medical History:  Diagnosis Date   Acute low back pain 03/25/2020   Anxiety    Cancer (HCC) 2000?   thyroid  cancer   Depression    Diabetes mellitus without complication (HCC)    Hyperlipidemia    Hypertension    Thyroid  disease     Past Surgical History:  Procedure Laterality Date   ABDOMINAL HYSTERECTOMY     BREAST CYST EXCISION Left    CHOLECYSTECTOMY     CORONARY/GRAFT ACUTE MI REVASCULARIZATION N/A 11/13/2022   Procedure: Coronary/Graft Acute MI Revascularization;  Surgeon: Wonda Sharper, MD;  Location: [facility] INVASIVE CV LAB;  Service: Cardiovascular;  Laterality: N/A;   INCISE AND DRAIN ABCESS     on right leg    LEFT HEART CATH AND CORONARY ANGIOGRAPHY N/A 11/13/2022   Procedure: LEFT HEART CATH AND CORONARY ANGIOGRAPHY;  Surgeon: Wonda Sharper, MD;  Location: [facility] INVASIVE CV LAB;  Service: Cardiovascular;  Laterality: N/A;   THYROIDECTOMY  Family History  Problem Relation Age of Onset   Kidney disease Mother    Hypertension Mother    Hypertension Sister    Heart disease Brother    Colon cancer Neg Hx    Colon polyps Neg Hx    Esophageal cancer Neg Hx    Rectal cancer Neg Hx    Stomach cancer Neg Hx     Social History   Socioeconomic History   Marital status: Single    Spouse name: Not on file   Number of children: Not on file   Years of education: Not on file   Highest education level: Not on file  Occupational History   Not on file  Tobacco Use   Smoking status: Every Day    Current packs/day: 0.50    Types: Cigarettes   Smokeless tobacco: Never  Vaping Use   Vaping status: Never Used  Substance and  Sexual Activity   Alcohol use: No   Drug use: No   Sexual activity: Not Currently  Other Topics Concern   Not on file  Social History Narrative   Not on file   Social Drivers of Health   Financial Resource Strain: Not on file  Food Insecurity: No Food Insecurity (11/14/2022)   Hunger Vital Sign    Worried About Running Out of Food in the Last Year: Never true    Ran Out of Food in the Last Year: Never true  Transportation Needs: No Transportation Needs (11/14/2022)   PRAPARE - Administrator, Civil Service (Medical): No    Lack of Transportation (Non-Medical): No  Physical Activity: Not on file  Stress: Not on file  Social Connections: Not on file  Intimate Partner Violence: Not At Risk (11/14/2022)   Humiliation, Afraid, Rape, and Kick questionnaire    Fear of Current or Ex-Partner: No    Emotionally Abused: No    Physically Abused: No    Sexually Abused: No    Review of Systems  All other systems reviewed and are negative.       Objective    BP (!) 153/86   Pulse 72   Ht 5' 3 (1.6 m)   Wt 225 lb (102.1 kg)   SpO2 96%   BMI 39.86 kg/m   Physical Exam Vitals and nursing note reviewed.  Constitutional:      General: She is not in acute distress.    Appearance: She is obese.  Cardiovascular:     Rate and Rhythm: Normal rate and regular rhythm.  Pulmonary:     Effort: Pulmonary effort is normal.     Breath sounds: Normal breath sounds.  Abdominal:     Palpations: Abdomen is soft.     Tenderness: There is no abdominal tenderness.  Neurological:     General: No focal deficit present.     Mental Status: She is alert and oriented to person, place, and time.  Psychiatric:        Mood and Affect: Affect normal. Mood is anxious.     {Labs (Optional):23779}    Assessment & Plan:   There are no diagnoses linked to this encounter.   No follow-ups on file.   Tanda Raguel SQUIBB, MD

## 2023-08-03 ENCOUNTER — Encounter: Payer: Self-pay | Admitting: Family Medicine

## 2023-08-03 ENCOUNTER — Ambulatory Visit: Admitting: Family Medicine

## 2023-08-08 ENCOUNTER — Other Ambulatory Visit: Payer: Self-pay | Admitting: Family Medicine

## 2023-08-10 ENCOUNTER — Other Ambulatory Visit (HOSPITAL_COMMUNITY): Payer: Self-pay

## 2023-08-19 ENCOUNTER — Other Ambulatory Visit (HOSPITAL_COMMUNITY): Payer: Self-pay

## 2023-08-25 ENCOUNTER — Ambulatory Visit: Admitting: Family Medicine

## 2023-08-29 ENCOUNTER — Telehealth: Payer: Self-pay

## 2023-08-29 ENCOUNTER — Other Ambulatory Visit (HOSPITAL_COMMUNITY): Payer: Self-pay

## 2023-08-29 MED ORDER — OLMESARTAN MEDOXOMIL 40 MG PO TABS
40.0000 mg | ORAL_TABLET | Freq: Every day | ORAL | 3 refills | Status: DC
  Filled 2023-08-29: qty 90, 90d supply, fill #0

## 2023-08-29 MED ORDER — AMLODIPINE BESYLATE 5 MG PO TABS
5.0000 mg | ORAL_TABLET | Freq: Every day | ORAL | 0 refills | Status: AC
  Filled 2023-12-27: qty 90, 90d supply, fill #0

## 2023-08-29 NOTE — Telephone Encounter (Signed)
Refill sent to the pharmacy electronically.  

## 2023-09-27 ENCOUNTER — Other Ambulatory Visit: Payer: Self-pay

## 2023-09-29 ENCOUNTER — Other Ambulatory Visit (HOSPITAL_COMMUNITY): Payer: Self-pay

## 2023-09-29 ENCOUNTER — Other Ambulatory Visit: Payer: Self-pay

## 2023-10-05 ENCOUNTER — Telehealth: Payer: Self-pay | Admitting: Pharmacist

## 2023-10-05 NOTE — Telephone Encounter (Signed)
 Zetia  added to statin and statin non-adheracne addressed last OV 07/19/2023 follow up lab is due, call patient to remind for lab. N/A

## 2023-10-05 NOTE — Telephone Encounter (Signed)
 Pt called back, will be going to PCP on Friday Sept 26 will get lab done that day.

## 2023-10-07 ENCOUNTER — Ambulatory Visit: Admitting: Nurse Practitioner

## 2023-10-07 NOTE — Progress Notes (Deleted)
 Office Visit    Patient Name: Cynthia Patton Date of Encounter: 10/07/2023  Primary Care Provider:  Tanda Bleacher, MD Primary Cardiologist:  Ozell Fell, MD  Chief Complaint    63 year old female with a history of CAD s/p inferior STEMI, DES-RCA in 11/2022, hypertension, hyperlipidemia, diet-controlled diabetes, thyroid  cancer s/p thyroidectomy, and tobacco use who presents for follow-up related to CAD and hypertension.   Past Medical History    Past Medical History:  Diagnosis Date   Acute low back pain 03/25/2020   Anxiety    Cancer (HCC) 2000?   thyroid  cancer   Depression    Diabetes mellitus without complication (HCC)    Hyperlipidemia    Hypertension    Thyroid  disease    Past Surgical History:  Procedure Laterality Date   ABDOMINAL HYSTERECTOMY     BREAST CYST EXCISION Left    CHOLECYSTECTOMY     CORONARY/GRAFT ACUTE MI REVASCULARIZATION N/A 11/13/2022   Procedure: Coronary/Graft Acute MI Revascularization;  Surgeon: Fell Ozell, MD;  Location: [facility] INVASIVE CV LAB;  Service: Cardiovascular;  Laterality: N/A;   INCISE AND DRAIN ABCESS     on right leg    LEFT HEART CATH AND CORONARY ANGIOGRAPHY N/A 11/13/2022   Procedure: LEFT HEART CATH AND CORONARY ANGIOGRAPHY;  Surgeon: Fell Ozell, MD;  Location: [facility] INVASIVE CV LAB;  Service: Cardiovascular;  Laterality: N/A;   THYROIDECTOMY      Allergies  Allergies  Allergen Reactions   Floxin [Ocuflox] Other (See Comments)    Disoriented   Ofloxacin Other (See Comments)    Disoriented   Oxycodone -Acetaminophen      Other Reaction(s): Other (See Comments)  Hallucinations   Percocet [Oxycodone -Acetaminophen ]      Labs/Other Studies Reviewed    The following studies were reviewed today:  Cardiac Studies & Procedures   ______________________________________________________________________________________________ CARDIAC CATHETERIZATION  CARDIAC CATHETERIZATION 11/13/2022  Conclusion   Mid RCA  lesion is 100% stenosed.   Prox LAD to Mid LAD lesion is 30% stenosed.   Mid LM to Dist LM lesion is 25% stenosed.   Mid Cx lesion is 40% stenosed.   A drug-eluting stent was successfully placed using a STENT SYNERGY XD 3.0X32.   Post intervention, there is a 0% residual stenosis.   LV end diastolic pressure is moderately elevated.   Recommend uninterrupted dual antiplatelet therapy with Aspirin  81mg  daily and Ticagrelor  90mg  twice daily for a minimum of 12 months (ACS-Class I recommendation).  1.  Acute inferior MI secondary to mid RCA occlusion, treated successfully with primary PCI using a 3.0 x 32 mm Synergy DES.  Baseline 100% stenosis, TIMI 0 flow.  Post-PCI 0% residual stenosis and TIMI-3 flow. 2.  Mild nonobstructive plaquing of the left main, LAD, and left circumflex, without any high-grade stenoses in the left coronary distribution 3.  Moderately elevated LVEDP 24 mmHg  Recommendations: 2D echocardiogram for assessment of LV function Aggrastat  discontinued at completion of procedure DAPT with aspirin  and ticagrelor  for 12 months without interruption Aggressive risk modification with high intensity statin drug, goal LDL less than 55 Consider fast-track discharge tomorrow if criteria met  Findings Coronary Findings Diagnostic  Dominance: Right  Left Main Mid LM to Dist LM lesion is 25% stenosed.  Left Anterior Descending Prox LAD to Mid LAD lesion is 30% stenosed.  Left Circumflex Mid Cx lesion is 40% stenosed.  Right Coronary Artery Mid RCA lesion is 100% stenosed. Vessel is the culprit lesion. The lesion is type C and thrombotic.  Intervention  Mid  RCA lesion Stent A drug-eluting stent was successfully placed using a STENT SYNERGY XD 3.0X32. Post-stent angioplasty was performed using a BALLN [place] EMERGE MR 3.25X15. Maximum pressure:  14 atm. Post-Intervention Lesion Assessment The intervention was successful. Pre-interventional TIMI flow is 0. Post-intervention TIMI  flow is 3. No complications occurred at this lesion. There is a 0% residual stenosis post intervention.     ECHOCARDIOGRAM  ECHOCARDIOGRAM COMPLETE 11/14/2022  Narrative ECHOCARDIOGRAM REPORT    Patient Name:   Cynthia Patton Date of Exam: 11/14/2022 Medical Rec #:  [id number]      Height:       63.0 in Accession #:    [id number]     Weight:       214.9 lb Date of Birth:  03-Apr-1960       BSA:          1.994 m Patient Age:    62 years       BP:           157/68 mmHg Patient Gender: F              HR:           66 bpm. Exam Location:  Inpatient  Procedure: 2D Echo, Cardiac Doppler and Color Doppler  Indications:    I25.110 Atherosclerotic heart disease of native coronary artery with unstable angina pectoris  History:        Patient has no prior history of Echocardiogram examinations. CAD and Acute MI, Signs/Symptoms:Altered Mental Status; Risk Factors:Hypertension, Diabetes and Dyslipidemia.  Sonographer:    Ellouise Mose RDCS Referring Phys: [phone] MICHAEL COOPER   Sonographer Comments: Technically difficult study due to poor echo windows and patient is obese. Image acquisition challenging due to patient body habitus. Patient did not tolerate pressure of probe well. IMPRESSIONS   1. Left ventricular ejection fraction, by estimation, is 60 to 65%. The left ventricle has normal function. The left ventricle has no regional wall motion abnormalities. There is mild concentric left ventricular hypertrophy. Left ventricular diastolic parameters are consistent with Grade I diastolic dysfunction (impaired relaxation). Elevated left atrial pressure. 2. Right ventricular systolic function is normal. The right ventricular size is normal. Tricuspid regurgitation signal is inadequate for assessing PA pressure. 3. The mitral valve is normal in structure. No evidence of mitral valve regurgitation. No evidence of mitral stenosis. 4. The aortic valve is tricuspid. Aortic valve regurgitation is not  visualized. No aortic stenosis is present. 5. The inferior vena cava is normal in size with greater than 50% respiratory variability, suggesting right atrial pressure of 3 mmHg.  Comparison(s): No prior Echocardiogram.  FINDINGS Left Ventricle: Left ventricular ejection fraction, by estimation, is 60 to 65%. The left ventricle has normal function. The left ventricle has no regional wall motion abnormalities. The left ventricular internal cavity size was normal in size. There is mild concentric left ventricular hypertrophy. Left ventricular diastolic parameters are consistent with Grade I diastolic dysfunction (impaired relaxation). Elevated left atrial pressure.  Right Ventricle: The right ventricular size is normal. Right ventricular systolic function is normal. Tricuspid regurgitation signal is inadequate for assessing PA pressure. The tricuspid regurgitant velocity is 2.44 m/s, and with an assumed right atrial pressure of 3 mmHg, the estimated right ventricular systolic pressure is 26.8 mmHg.  Left Atrium: Left atrial size was normal in size.  Right Atrium: Right atrial size was normal in size.  Pericardium: There is no evidence of pericardial effusion.  Mitral Valve: The mitral valve is normal in  structure. No evidence of mitral valve regurgitation. No evidence of mitral valve stenosis.  Tricuspid Valve: The tricuspid valve is normal in structure. Tricuspid valve regurgitation is trivial. No evidence of tricuspid stenosis.  Aortic Valve: The aortic valve is tricuspid. Aortic valve regurgitation is not visualized. No aortic stenosis is present.  Pulmonic Valve: The pulmonic valve was normal in structure. Pulmonic valve regurgitation is not visualized. No evidence of pulmonic stenosis.  Aorta: The aortic root is normal in size and structure.  Venous: The inferior vena cava is normal in size with greater than 50% respiratory variability, suggesting right atrial pressure of 3  mmHg.  IAS/Shunts: No atrial level shunt detected by color flow Doppler.   LEFT VENTRICLE PLAX 2D LVIDd:         5.00 cm     Diastology LVIDs:         3.45 cm     LV e' medial:    3.59 cm/s LV PW:         1.30 cm     LV E/e' medial:  22.7 LV IVS:        1.25 cm     LV e' lateral:   4.68 cm/s LVOT diam:     2.20 cm     LV E/e' lateral: 17.4 LV SV:         73 LV SV Index:   37 LVOT Area:     3.80 cm  LV Volumes (MOD) LV vol d, MOD A2C: 72.4 ml LV vol d, MOD A4C: 77.3 ml LV vol s, MOD A2C: 28.2 ml LV vol s, MOD A4C: 38.8 ml LV SV MOD A2C:     44.2 ml LV SV MOD A4C:     77.3 ml LV SV MOD BP:      42.7 ml  RIGHT VENTRICLE             IVC RV S prime:     11.70 cm/s  IVC diam: 2.20 cm TAPSE (M-mode): 2.3 cm  LEFT ATRIUM             Index        RIGHT ATRIUM           Index LA diam:        3.30 cm 1.66 cm/m   RA Area:     11.60 cm LA Vol (A2C):   49.3 ml 24.73 ml/m  RA Volume:   24.10 ml  12.09 ml/m LA Vol (A4C):   54.3 ml 27.23 ml/m LA Biplane Vol: 52.7 ml 26.43 ml/m AORTIC VALVE LVOT Vmax:   89.10 cm/s LVOT Vmean:  59.200 cm/s LVOT VTI:    0.193 m  AORTA Ao Root diam: 2.70 cm Ao Asc diam:  3.10 cm  MITRAL VALVE                TRICUSPID VALVE MV Area (PHT): 3.72 cm     TR Peak grad:   23.8 mmHg MV Decel Time: 204 msec     TR Vmax:        244.00 cm/s MV E velocity: 81.55 cm/s MV A velocity: 107.50 cm/s  SHUNTS MV E/A ratio:  0.76         Systemic VTI:  0.19 m Systemic Diam: 2.20 cm  Redell Shallow MD Electronically signed by Redell Shallow MD Signature Date/Time: 11/14/2022/11:54:12 AM    Final          ______________________________________________________________________________________________     Recent Labs: 11/14/2022: Hemoglobin 12.1; Platelets  309; TSH 1.305 04/14/2023: ALT 8; BUN 18; Creatinine, Ser 0.96; Potassium 4.3; Sodium 142  Recent Lipid Panel    Component Value Date/Time   CHOL 193 04/14/2023 0857   TRIG 83 04/14/2023 0857   HDL  53 04/14/2023 0857   CHOLHDL 3.6 04/14/2023 0857   CHOLHDL 4.3 11/13/2022 1934   VLDL 28 11/13/2022 1934   LDLCALC 125 (H) 04/14/2023 0857    History of Present Illness    63 year old female with the above past medical history including CAD s/p inferior STEMI, DES-RCA in 11/2022, hypertension, hyperlipidemia, diet-controlled diabetes, thyroid  cancer s/p thyroidectomy, and tobacco use.   She was hospitalized in November 2024 in the setting of inferior STEMI.  Cardiology was consulted.  She underwent emergent cardiac catheterization which revealed 100% stenosis of the RCA, otherwise mild nonobstructive disease of the left main, LAD and left circumflex arteries, LVEDP was elevated at 24 mmHg.  She underwent successful PCI/DES-RCA.  Echocardiogram showed EF 60 to 65%, mild LVH, G1 DD.  She was started on DAPT with aspirin  and Brilinta .  She was later transitioned to Plavix .  She reported to concern for constipation with Plavix  and as a result was transitioned to Effient .  She has had difficulty with elevated BP, she was transitioned from losartan  to olmesartan , carvedilol  was increased to 12.5 mg twice daily, she was started on amlodipine .  Itamar sleep study was ordered but not completed.  She was last seen in the office on 05/27/2023 stable from a cardiac standpoint.  She denied symptoms concerning for angina.  She was referred to lipid clinic Pharm.D. given ongoing elevated LDL despite high intensity statin therapy.   She presents today for follow-up.  Since her last visit she has   1. CAD: S/p inferior STEMI, DES-RCA in 11/2022. Echocardiogram showed EF 60 to 65%, mild LVH, G1 DD. Stable with no anginal symptoms. No indication for ischemic evaluation. Continue ASA, Effient , Olmesartan , carvedilol , amlodipine , and Lipitor.    2. Hypertension: BP  elevated in office today, has been generally well controlled since starting amlodipine . She did not take her medicine prior to today's visit. Continue to  monitor BP report BP consistently greater than 130/80.  Continue current antihypertensive regimen.    3. Hyperlipidemia: LDL was 125 in 04/2023. She states that she was fasting for her recent lab work.  Given this ongoing elevated LDL despite high intensity statin therapy, will refer to lipid clinic Pharm.D. for consideration of PCSK9 inhibitor. Continue Lipitor.   4. Type 2 diabetes: A1c was 6.3 in 01/2023, diet controlled.   Encouraged increased activity as tolerated.  She notes she previously took Ozempic, tolerated this well.  She states she plans to discuss possible restarting Ozempic with her endocrinologist.   5. Tobacco use: She continues to smoke. Full cessation advised.    6.  History of snoring/Sleep disordered breathing: Pending Itamar sleep study. She has the test at home, encouraged her to complete it.    7. Disposition: Follow-up in   Home Medications    Current Outpatient Medications  Medication Sig Dispense Refill   amLODipine  (NORVASC ) 5 MG tablet Take 1 tablet (5 mg total) by mouth daily. 90 tablet 0   aspirin  81 MG chewable tablet Chew 1 tablet (81 mg total) by mouth daily. 90 tablet 3   atorvastatin  (LIPITOR) 80 MG tablet Take 1 tablet (80 mg total) by mouth daily. 90 tablet 3   blood glucose meter kit and supplies KIT Dispense based on patient and insurance preference. Use  up to four times daily as directed. (FOR ICD-9 250.00, 250.01). 1 each 0   carvedilol  (COREG ) 12.5 MG tablet Take 1 tablet (12.5 mg total) by mouth 2 (two) times daily. 60 tablet 3   clopidogrel  (PLAVIX ) 75 MG tablet Take 1 tablet (75 mg total) by mouth daily. 94 tablet 3   ezetimibe  (ZETIA ) 10 MG tablet Take 1 tablet (10 mg total) by mouth daily. 90 tablet 3   fluticasone  (FLONASE ) 50 MCG/ACT nasal spray Place 2 sprays into both nostrils daily. (Patient taking differently: Place 2 sprays into both nostrils as needed.) 16 g 6   levothyroxine  (SYNTHROID ) 175 MCG tablet Take 175 mcg by mouth daily before  breakfast.     levothyroxine  (SYNTHROID ) 175 MCG tablet Take 1 tablet (175 mcg) by mouth daily for 6 days a week then 1/2 tablet (87.5 mcg) one day a week. 30 tablet 5   mirtazapine  (REMERON ) 7.5 MG tablet TAKE 1 TABLET BY MOUTH AT BEDTIME 30 tablet 0   nitroGLYCERIN  (NITROSTAT ) 0.4 MG SL tablet Place 1 tablet (0.4 mg total) under the tongue every 5 (five) minutes as needed for chest pain. 25 tablet 2   olmesartan  (BENICAR ) 40 MG tablet Take 1 tablet (40 mg total) by mouth daily. 30 tablet 6   olmesartan  (BENICAR ) 40 MG tablet Take 1 tablet (40 mg total) by mouth daily. 90 tablet 3   ONETOUCH VERIO test strip      prasugrel  (EFFIENT ) 10 MG TABS tablet Take 1 tablet (10 mg total) by mouth daily. 90 tablet 3   prasugrel  (EFFIENT ) 10 MG TABS tablet Take 1 tablet (10 mg total) by mouth daily. 90 tablet 3   No current facility-administered medications for this visit.     Review of Systems    ***.  All other systems reviewed and are otherwise negative except as noted above.    Physical Exam    VS:  There were no vitals taken for this visit. , BMI There is no height or weight on file to calculate BMI.     GEN: Well nourished, well developed, in no acute distress. HEENT: normal. Neck: Supple, no JVD, carotid bruits, or masses. Cardiac: RRR, no murmurs, rubs, or gallops. No clubbing, cyanosis, edema.  Radials/DP/PT 2+ and equal bilaterally.  Respiratory:  Respirations regular and unlabored, clear to auscultation bilaterally. GI: Soft, nontender, nondistended, BS + x 4. MS: no deformity or atrophy. Skin: warm and dry, no rash. Neuro:  Strength and sensation are intact. Psych: Normal affect.  Accessory Clinical Findings    ECG personally reviewed by me today -    - no acute changes.   Lab Results  Component Value Date   WBC 9.8 11/14/2022   HGB 12.1 11/14/2022   HCT 36.9 11/14/2022   MCV 92.7 11/14/2022   PLT 309 11/14/2022   Lab Results  Component Value Date   CREATININE 0.96  04/14/2023   BUN 18 04/14/2023   NA 142 04/14/2023   K 4.3 04/14/2023   CL 105 04/14/2023   CO2 25 04/14/2023   Lab Results  Component Value Date   ALT 8 04/14/2023   AST 10 04/14/2023   ALKPHOS 84 04/14/2023   BILITOT 0.3 04/14/2023   Lab Results  Component Value Date   CHOL 193 04/14/2023   HDL 53 04/14/2023   LDLCALC 125 (H) 04/14/2023   TRIG 83 04/14/2023   CHOLHDL 3.6 04/14/2023    Lab Results  Component Value Date   HGBA1C 6.8 (H) 11/13/2022  Assessment & Plan    1.  ***  No BP recorded.  {Refresh Note OR Click here to enter BP  :1}***   Damien JAYSON Braver, NP 10/07/2023, 6:07 AM

## 2023-10-10 ENCOUNTER — Encounter: Payer: Self-pay | Admitting: Family Medicine

## 2023-10-10 ENCOUNTER — Ambulatory Visit (INDEPENDENT_AMBULATORY_CARE_PROVIDER_SITE_OTHER): Admitting: Family Medicine

## 2023-10-10 VITALS — BP 130/76 | HR 65 | Ht 63.0 in | Wt 217.4 lb

## 2023-10-10 DIAGNOSIS — B349 Viral infection, unspecified: Secondary | ICD-10-CM | POA: Diagnosis not present

## 2023-10-10 NOTE — Progress Notes (Signed)
 Established Patient Office Visit  Subjective    Patient ID: Cynthia Patton, female    DOB: February 23, 1960  Age: 63 y.o. MRN: [id number]  CC:  Chief Complaint  Patient presents with   Medical Management of Chronic Issues    Pt reports she is having a cough and congestion. Does not know if it is a cold or flu     HPI Cynthia Patton presents for routine for complaint of cough congestion and body aches for the past 3-4 days. She also reports some fatigue. She denies known fever/chills or contacts/exposures.   Outpatient Encounter Medications as of 10/10/2023  Medication Sig   amLODipine  (NORVASC ) 5 MG tablet Take 1 tablet (5 mg total) by mouth daily.   aspirin  81 MG chewable tablet Chew 1 tablet (81 mg total) by mouth daily.   atorvastatin  (LIPITOR) 80 MG tablet Take 1 tablet (80 mg total) by mouth daily.   carvedilol  (COREG ) 12.5 MG tablet Take 1 tablet (12.5 mg total) by mouth 2 (two) times daily.   clopidogrel  (PLAVIX ) 75 MG tablet Take 1 tablet (75 mg total) by mouth daily.   ezetimibe  (ZETIA ) 10 MG tablet Take 1 tablet (10 mg total) by mouth daily.   levothyroxine  (SYNTHROID ) 175 MCG tablet Take 175 mcg by mouth daily before breakfast.   mirtazapine  (REMERON ) 7.5 MG tablet TAKE 1 TABLET BY MOUTH AT BEDTIME   nitroGLYCERIN  (NITROSTAT ) 0.4 MG SL tablet Place 1 tablet (0.4 mg total) under the tongue every 5 (five) minutes as needed for chest pain.   olmesartan  (BENICAR ) 40 MG tablet Take 1 tablet (40 mg total) by mouth daily.   prasugrel  (EFFIENT ) 10 MG TABS tablet Take 1 tablet (10 mg total) by mouth daily.   blood glucose meter kit and supplies KIT Dispense based on patient and insurance preference. Use up to four times daily as directed. (FOR ICD-9 250.00, 250.01).   fluticasone  (FLONASE ) 50 MCG/ACT nasal spray Place 2 sprays into both nostrils daily. (Patient taking differently: Place 2 sprays into both nostrils as needed.)   levothyroxine  (SYNTHROID ) 175 MCG tablet Take 1 tablet (175  mcg) by mouth daily for 6 days a week then 1/2 tablet (87.5 mcg) one day a week.   olmesartan  (BENICAR ) 40 MG tablet Take 1 tablet (40 mg total) by mouth daily.   ONETOUCH VERIO test strip    prasugrel  (EFFIENT ) 10 MG TABS tablet Take 1 tablet (10 mg total) by mouth daily.   No facility-administered encounter medications on file as of 10/10/2023.    Past Medical History:  Diagnosis Date   Acute low back pain 03/25/2020   Anxiety    Cancer (HCC) 2000?   thyroid  cancer   Depression    Diabetes mellitus without complication (HCC)    Hyperlipidemia    Hypertension    Thyroid  disease     Past Surgical History:  Procedure Laterality Date   ABDOMINAL HYSTERECTOMY     BREAST CYST EXCISION Left    CHOLECYSTECTOMY     CORONARY/GRAFT ACUTE MI REVASCULARIZATION N/A 11/13/2022   Procedure: Coronary/Graft Acute MI Revascularization;  Surgeon: Wonda Sharper, MD;  Location: [facility] INVASIVE CV LAB;  Service: Cardiovascular;  Laterality: N/A;   INCISE AND DRAIN ABCESS     on right leg    LEFT HEART CATH AND CORONARY ANGIOGRAPHY N/A 11/13/2022   Procedure: LEFT HEART CATH AND CORONARY ANGIOGRAPHY;  Surgeon: Wonda Sharper, MD;  Location: [facility] INVASIVE CV LAB;  Service: Cardiovascular;  Laterality: N/A;   THYROIDECTOMY  Family History  Problem Relation Age of Onset   Kidney disease Mother    Hypertension Mother    Hypertension Sister    Heart disease Brother    Colon cancer Neg Hx    Colon polyps Neg Hx    Esophageal cancer Neg Hx    Rectal cancer Neg Hx    Stomach cancer Neg Hx     Social History   Socioeconomic History   Marital status: Single    Spouse name: Not on file   Number of children: Not on file   Years of education: Not on file   Highest education level: Not on file  Occupational History   Not on file  Tobacco Use   Smoking status: Every Day    Current packs/day: 0.50    Types: Cigarettes   Smokeless tobacco: Never  Vaping Use   Vaping status: Never Used   Substance and Sexual Activity   Alcohol use: No   Drug use: No   Sexual activity: Not Currently  Other Topics Concern   Not on file  Social History Narrative   Not on file   Social Drivers of Health   Financial Resource Strain: Not on file  Food Insecurity: No Food Insecurity (11/14/2022)   Hunger Vital Sign    Worried About Running Out of Food in the Last Year: Never true    Ran Out of Food in the Last Year: Never true  Transportation Needs: No Transportation Needs (11/14/2022)   PRAPARE - Administrator, Civil Service (Medical): No    Lack of Transportation (Non-Medical): No  Physical Activity: Not on file  Stress: Not on file  Social Connections: Not on file  Intimate Partner Violence: Not At Risk (11/14/2022)   Humiliation, Afraid, Rape, and Kick questionnaire    Fear of Current or Ex-Partner: No    Emotionally Abused: No    Physically Abused: No    Sexually Abused: No    Review of Systems  All other systems reviewed and are negative.       Objective    BP 130/76   Pulse 65   Ht 5' 3 (1.6 m)   Wt 217 lb 6.4 oz (98.6 kg)   SpO2 95%   BMI 38.51 kg/m   Physical Exam Vitals and nursing note reviewed.  Constitutional:      General: She is not in acute distress. Cardiovascular:     Rate and Rhythm: Normal rate and regular rhythm.  Pulmonary:     Effort: Pulmonary effort is normal.     Breath sounds: Normal breath sounds.  Abdominal:     Palpations: Abdomen is soft.     Tenderness: There is no abdominal tenderness.  Neurological:     General: No focal deficit present.     Mental Status: She is alert and oriented to person, place, and time.         Assessment & Plan:   Viral syndrome   Conservative viral management including adequate rest and fluids and tylenol /nsaids prn. Patient to RTW 10/01.  No follow-ups on file.   Tanda Raguel SQUIBB, MD

## 2023-10-11 ENCOUNTER — Ambulatory Visit: Attending: Nurse Practitioner | Admitting: Nurse Practitioner

## 2023-10-11 ENCOUNTER — Encounter: Payer: Self-pay | Admitting: Nurse Practitioner

## 2023-10-11 ENCOUNTER — Other Ambulatory Visit (HOSPITAL_COMMUNITY): Payer: Self-pay

## 2023-10-11 VITALS — BP 124/75 | HR 65 | Ht 63.0 in | Wt 217.4 lb

## 2023-10-11 DIAGNOSIS — E785 Hyperlipidemia, unspecified: Secondary | ICD-10-CM

## 2023-10-11 DIAGNOSIS — I2510 Atherosclerotic heart disease of native coronary artery without angina pectoris: Secondary | ICD-10-CM

## 2023-10-11 DIAGNOSIS — I10 Essential (primary) hypertension: Secondary | ICD-10-CM | POA: Diagnosis not present

## 2023-10-11 DIAGNOSIS — E118 Type 2 diabetes mellitus with unspecified complications: Secondary | ICD-10-CM

## 2023-10-11 DIAGNOSIS — Z720 Tobacco use: Secondary | ICD-10-CM

## 2023-10-11 DIAGNOSIS — R0683 Snoring: Secondary | ICD-10-CM

## 2023-10-11 MED ORDER — PRASUGREL HCL 10 MG PO TABS
10.0000 mg | ORAL_TABLET | Freq: Every day | ORAL | 3 refills | Status: AC
  Filled 2023-10-11: qty 90, 90d supply, fill #0
  Filled 2023-11-09: qty 30, 30d supply, fill #0

## 2023-10-11 MED ORDER — OLMESARTAN MEDOXOMIL 40 MG PO TABS
40.0000 mg | ORAL_TABLET | Freq: Every day | ORAL | 3 refills | Status: AC
  Filled 2023-10-11 – 2023-12-27 (×2): qty 90, 90d supply, fill #0
  Filled 2024-01-03: qty 30, 30d supply, fill #0

## 2023-10-11 MED ORDER — EZETIMIBE 10 MG PO TABS
10.0000 mg | ORAL_TABLET | Freq: Every day | ORAL | 3 refills | Status: AC
  Filled 2023-10-11 – 2023-12-27 (×3): qty 90, 90d supply, fill #0

## 2023-10-11 MED ORDER — CARVEDILOL 12.5 MG PO TABS
12.5000 mg | ORAL_TABLET | Freq: Two times a day (BID) | ORAL | 3 refills | Status: AC
  Filled 2023-10-11: qty 180, 90d supply, fill #0
  Filled 2023-11-09: qty 60, 30d supply, fill #0
  Filled 2023-12-27: qty 60, 30d supply, fill #1

## 2023-10-11 MED ORDER — CLOPIDOGREL BISULFATE 75 MG PO TABS
75.0000 mg | ORAL_TABLET | Freq: Every day | ORAL | 3 refills | Status: DC
  Filled 2023-10-11: qty 94, 94d supply, fill #0

## 2023-10-11 NOTE — Progress Notes (Addendum)
 Office Visit    Patient Name: Cynthia Patton Date of Encounter: 10/11/2023  Primary Care Provider:  Tanda Bleacher, MD Primary Cardiologist:  Ozell Fell, MD  Chief Complaint    63 year old female with a history of CAD s/p inferior STEMI, DES-RCA in 11/2022, hypertension, hyperlipidemia, diet-controlled diabetes, thyroid  cancer s/p thyroidectomy, and tobacco use who presents for follow-up related to CAD and hypertension.   Past Medical History    Past Medical History:  Diagnosis Date   Acute low back pain 03/25/2020   Anxiety    Cancer (HCC) 2000?   thyroid  cancer   Depression    Diabetes mellitus without complication (HCC)    Hyperlipidemia    Hypertension    Thyroid  disease    Past Surgical History:  Procedure Laterality Date   ABDOMINAL HYSTERECTOMY     BREAST CYST EXCISION Left    CHOLECYSTECTOMY     CORONARY/GRAFT ACUTE MI REVASCULARIZATION N/A 11/13/2022   Procedure: Coronary/Graft Acute MI Revascularization;  Surgeon: Fell Ozell, MD;  Location: [facility] INVASIVE CV LAB;  Service: Cardiovascular;  Laterality: N/A;   INCISE AND DRAIN ABCESS     on right leg    LEFT HEART CATH AND CORONARY ANGIOGRAPHY N/A 11/13/2022   Procedure: LEFT HEART CATH AND CORONARY ANGIOGRAPHY;  Surgeon: Fell Ozell, MD;  Location: [facility] INVASIVE CV LAB;  Service: Cardiovascular;  Laterality: N/A;   THYROIDECTOMY      Allergies  Allergies  Allergen Reactions   Floxin [Ocuflox] Other (See Comments)    Disoriented   Ofloxacin Other (See Comments)    Disoriented   Oxycodone -Acetaminophen      Other Reaction(s): Other (See Comments)  Hallucinations   Percocet [Oxycodone -Acetaminophen ]      Labs/Other Studies Reviewed    The following studies were reviewed today:  Cardiac Studies & Procedures   ______________________________________________________________________________________________ CARDIAC CATHETERIZATION  CARDIAC CATHETERIZATION 11/13/2022  Conclusion   Mid RCA  lesion is 100% stenosed.   Prox LAD to Mid LAD lesion is 30% stenosed.   Mid LM to Dist LM lesion is 25% stenosed.   Mid Cx lesion is 40% stenosed.   A drug-eluting stent was successfully placed using a STENT SYNERGY XD 3.0X32.   Post intervention, there is a 0% residual stenosis.   LV end diastolic pressure is moderately elevated.   Recommend uninterrupted dual antiplatelet therapy with Aspirin  81mg  daily and Ticagrelor  90mg  twice daily for a minimum of 12 months (ACS-Class I recommendation).  1.  Acute inferior MI secondary to mid RCA occlusion, treated successfully with primary PCI using a 3.0 x 32 mm Synergy DES.  Baseline 100% stenosis, TIMI 0 flow.  Post-PCI 0% residual stenosis and TIMI-3 flow. 2.  Mild nonobstructive plaquing of the left main, LAD, and left circumflex, without any high-grade stenoses in the left coronary distribution 3.  Moderately elevated LVEDP 24 mmHg  Recommendations: 2D echocardiogram for assessment of LV function Aggrastat  discontinued at completion of procedure DAPT with aspirin  and ticagrelor  for 12 months without interruption Aggressive risk modification with high intensity statin drug, goal LDL less than 55 Consider fast-track discharge tomorrow if criteria met  Findings Coronary Findings Diagnostic  Dominance: Right  Left Main Mid LM to Dist LM lesion is 25% stenosed.  Left Anterior Descending Prox LAD to Mid LAD lesion is 30% stenosed.  Left Circumflex Mid Cx lesion is 40% stenosed.  Right Coronary Artery Mid RCA lesion is 100% stenosed. Vessel is the culprit lesion. The lesion is type C and thrombotic.  Intervention  Mid  RCA lesion Stent A drug-eluting stent was successfully placed using a STENT SYNERGY XD 3.0X32. Post-stent angioplasty was performed using a BALLN [place] EMERGE MR 3.25X15. Maximum pressure:  14 atm. Post-Intervention Lesion Assessment The intervention was successful. Pre-interventional TIMI flow is 0. Post-intervention TIMI  flow is 3. No complications occurred at this lesion. There is a 0% residual stenosis post intervention.     ECHOCARDIOGRAM  ECHOCARDIOGRAM COMPLETE 11/14/2022  Narrative ECHOCARDIOGRAM REPORT    Patient Name:   Cynthia Patton Date of Exam: 11/14/2022 Medical Rec #:  [id number]      Height:       63.0 in Accession #:    [id number]     Weight:       214.9 lb Date of Birth:  Mar 11, 1960       BSA:          1.994 m Patient Age:    62 years       BP:           157/68 mmHg Patient Gender: F              HR:           66 bpm. Exam Location:  Inpatient  Procedure: 2D Echo, Cardiac Doppler and Color Doppler  Indications:    I25.110 Atherosclerotic heart disease of native coronary artery with unstable angina pectoris  History:        Patient has no prior history of Echocardiogram examinations. CAD and Acute MI, Signs/Symptoms:Altered Mental Status; Risk Factors:Hypertension, Diabetes and Dyslipidemia.  Sonographer:    Ellouise Mose RDCS Referring Phys: [phone] MICHAEL COOPER   Sonographer Comments: Technically difficult study due to poor echo windows and patient is obese. Image acquisition challenging due to patient body habitus. Patient did not tolerate pressure of probe well. IMPRESSIONS   1. Left ventricular ejection fraction, by estimation, is 60 to 65%. The left ventricle has normal function. The left ventricle has no regional wall motion abnormalities. There is mild concentric left ventricular hypertrophy. Left ventricular diastolic parameters are consistent with Grade I diastolic dysfunction (impaired relaxation). Elevated left atrial pressure. 2. Right ventricular systolic function is normal. The right ventricular size is normal. Tricuspid regurgitation signal is inadequate for assessing PA pressure. 3. The mitral valve is normal in structure. No evidence of mitral valve regurgitation. No evidence of mitral stenosis. 4. The aortic valve is tricuspid. Aortic valve regurgitation is not  visualized. No aortic stenosis is present. 5. The inferior vena cava is normal in size with greater than 50% respiratory variability, suggesting right atrial pressure of 3 mmHg.  Comparison(s): No prior Echocardiogram.  FINDINGS Left Ventricle: Left ventricular ejection fraction, by estimation, is 60 to 65%. The left ventricle has normal function. The left ventricle has no regional wall motion abnormalities. The left ventricular internal cavity size was normal in size. There is mild concentric left ventricular hypertrophy. Left ventricular diastolic parameters are consistent with Grade I diastolic dysfunction (impaired relaxation). Elevated left atrial pressure.  Right Ventricle: The right ventricular size is normal. Right ventricular systolic function is normal. Tricuspid regurgitation signal is inadequate for assessing PA pressure. The tricuspid regurgitant velocity is 2.44 m/s, and with an assumed right atrial pressure of 3 mmHg, the estimated right ventricular systolic pressure is 26.8 mmHg.  Left Atrium: Left atrial size was normal in size.  Right Atrium: Right atrial size was normal in size.  Pericardium: There is no evidence of pericardial effusion.  Mitral Valve: The mitral valve is normal in  structure. No evidence of mitral valve regurgitation. No evidence of mitral valve stenosis.  Tricuspid Valve: The tricuspid valve is normal in structure. Tricuspid valve regurgitation is trivial. No evidence of tricuspid stenosis.  Aortic Valve: The aortic valve is tricuspid. Aortic valve regurgitation is not visualized. No aortic stenosis is present.  Pulmonic Valve: The pulmonic valve was normal in structure. Pulmonic valve regurgitation is not visualized. No evidence of pulmonic stenosis.  Aorta: The aortic root is normal in size and structure.  Venous: The inferior vena cava is normal in size with greater than 50% respiratory variability, suggesting right atrial pressure of 3  mmHg.  IAS/Shunts: No atrial level shunt detected by color flow Doppler.   LEFT VENTRICLE PLAX 2D LVIDd:         5.00 cm     Diastology LVIDs:         3.45 cm     LV e' medial:    3.59 cm/s LV PW:         1.30 cm     LV E/e' medial:  22.7 LV IVS:        1.25 cm     LV e' lateral:   4.68 cm/s LVOT diam:     2.20 cm     LV E/e' lateral: 17.4 LV SV:         73 LV SV Index:   37 LVOT Area:     3.80 cm  LV Volumes (MOD) LV vol d, MOD A2C: 72.4 ml LV vol d, MOD A4C: 77.3 ml LV vol s, MOD A2C: 28.2 ml LV vol s, MOD A4C: 38.8 ml LV SV MOD A2C:     44.2 ml LV SV MOD A4C:     77.3 ml LV SV MOD BP:      42.7 ml  RIGHT VENTRICLE             IVC RV S prime:     11.70 cm/s  IVC diam: 2.20 cm TAPSE (M-mode): 2.3 cm  LEFT ATRIUM             Index        RIGHT ATRIUM           Index LA diam:        3.30 cm 1.66 cm/m   RA Area:     11.60 cm LA Vol (A2C):   49.3 ml 24.73 ml/m  RA Volume:   24.10 ml  12.09 ml/m LA Vol (A4C):   54.3 ml 27.23 ml/m LA Biplane Vol: 52.7 ml 26.43 ml/m AORTIC VALVE LVOT Vmax:   89.10 cm/s LVOT Vmean:  59.200 cm/s LVOT VTI:    0.193 m  AORTA Ao Root diam: 2.70 cm Ao Asc diam:  3.10 cm  MITRAL VALVE                TRICUSPID VALVE MV Area (PHT): 3.72 cm     TR Peak grad:   23.8 mmHg MV Decel Time: 204 msec     TR Vmax:        244.00 cm/s MV E velocity: 81.55 cm/s MV A velocity: 107.50 cm/s  SHUNTS MV E/A ratio:  0.76         Systemic VTI:  0.19 m Systemic Diam: 2.20 cm  Redell Shallow MD Electronically signed by Redell Shallow MD Signature Date/Time: 11/14/2022/11:54:12 AM    Final          ______________________________________________________________________________________________     Recent Labs: 11/14/2022: Hemoglobin 12.1; Platelets  309; TSH 1.305 04/14/2023: ALT 8; BUN 18; Creatinine, Ser 0.96; Potassium 4.3; Sodium 142  Recent Lipid Panel    Component Value Date/Time   CHOL 193 04/14/2023 0857   TRIG 83 04/14/2023 0857   HDL  53 04/14/2023 0857   CHOLHDL 3.6 04/14/2023 0857   CHOLHDL 4.3 11/13/2022 1934   VLDL 28 11/13/2022 1934   LDLCALC 125 (H) 04/14/2023 0857    History of Present Illness    63 year old female with the above past medical history including CAD s/p inferior STEMI, DES-RCA in 11/2022, hypertension, hyperlipidemia, diet-controlled diabetes, thyroid  cancer s/p thyroidectomy, and tobacco use.   She was hospitalized in November 2024 in the setting of inferior STEMI.  Cardiology was consulted.  She underwent emergent cardiac catheterization which revealed 100% stenosis of the RCA, otherwise mild nonobstructive disease of the left main, LAD and left circumflex arteries, LVEDP was elevated at 24 mmHg.  She underwent successful PCI/DES-RCA.  Echocardiogram showed EF 60 to 65%, mild LVH, G1 DD.  She was started on DAPT with aspirin  and Brilinta .  She was later transitioned to Plavix .  She reported to concern for constipation with Plavix  and as a result was transitioned to Effient .  She has had difficulty with elevated BP, she was transitioned from losartan  to olmesartan , carvedilol  was increased to 12.5 mg twice daily, she was started on amlodipine .  Itamar sleep study was ordered but not completed.  She was last seen in the office on 05/27/2023 stable from a cardiac standpoint.  She denied symptoms concerning for angina.  She was referred to lipid clinic Pharm.D. given ongoing elevated LDL despite high intensity statin therapy.   She presents today for follow-up.  Since her last visit she has done well from a cardiac standpoint. She denies any symptoms concerning for angina.  BP has been well-controlled. She is recovering from a cold.  Otherwise, she reports feeling well.    Home Medications    Current Outpatient Medications  Medication Sig Dispense Refill   amLODipine  (NORVASC ) 5 MG tablet Take 1 tablet (5 mg total) by mouth daily. 90 tablet 0   aspirin  81 MG chewable tablet Chew 1 tablet (81 mg total) by  mouth daily. 90 tablet 3   atorvastatin  (LIPITOR) 80 MG tablet Take 1 tablet (80 mg total) by mouth daily. 90 tablet 3   blood glucose meter kit and supplies KIT Dispense based on patient and insurance preference. Use up to four times daily as directed. (FOR ICD-9 250.00, 250.01). 1 each 0   fluticasone  (FLONASE ) 50 MCG/ACT nasal spray Place 2 sprays into both nostrils daily. (Patient taking differently: Place 2 sprays into both nostrils as needed.) 16 g 6   levothyroxine  (SYNTHROID ) 175 MCG tablet Take 175 mcg by mouth daily before breakfast.     mirtazapine  (REMERON ) 7.5 MG tablet TAKE 1 TABLET BY MOUTH AT BEDTIME 30 tablet 0   nitroGLYCERIN  (NITROSTAT ) 0.4 MG SL tablet Place 1 tablet (0.4 mg total) under the tongue every 5 (five) minutes as needed for chest pain. 25 tablet 2   ONETOUCH VERIO test strip      carvedilol  (COREG ) 12.5 MG tablet Take 1 tablet (12.5 mg total) by mouth 2 (two) times daily. 180 tablet 3   clopidogrel  (PLAVIX ) 75 MG tablet Take 1 tablet (75 mg total) by mouth daily. 94 tablet 3   ezetimibe  (ZETIA ) 10 MG tablet Take 1 tablet (10 mg total) by mouth daily. 90 tablet 3   olmesartan  (BENICAR ) 40 MG tablet Take  1 tablet (40 mg total) by mouth daily. 90 tablet 3   prasugrel  (EFFIENT ) 10 MG TABS tablet Take 1 tablet (10 mg total) by mouth daily. (Patient not taking: Reported on 10/11/2023) 90 tablet 3   prasugrel  (EFFIENT ) 10 MG TABS tablet Take 1 tablet (10 mg total) by mouth daily. 90 tablet 3   No current facility-administered medications for this visit.     Review of Systems   She denies chest pain, palpitations, dyspnea, pnd, orthopnea, n, v, dizziness, syncope, edema, weight gain, or early satiety. All other systems reviewed and are otherwise negative except as noted above.   Physical Exam    VS:  BP 124/75   Pulse 65   Ht 5' 3 (1.6 m)   Wt 217 lb 6.4 oz (98.6 kg)   SpO2 99%   BMI 38.51 kg/m  GEN: Well nourished, well developed, in no acute distress. HEENT:  normal. Neck: Supple, no JVD, carotid bruits, or masses. Cardiac: RRR, no murmurs, rubs, or gallops. No clubbing, cyanosis, edema.  Radials/DP/PT 2+ and equal bilaterally.  Respiratory:  Respirations regular and unlabored, clear to auscultation bilaterally. GI: Soft, nontender, nondistended, BS + x 4. MS: no deformity or atrophy. Skin: warm and dry, no rash. Neuro:  Strength and sensation are intact. Psych: Normal affect.  Accessory Clinical Findings    ECG personally reviewed by me today -    - no EKG in office today.  Lab Results  Component Value Date   WBC 9.8 11/14/2022   HGB 12.1 11/14/2022   HCT 36.9 11/14/2022   MCV 92.7 11/14/2022   PLT 309 11/14/2022   Lab Results  Component Value Date   CREATININE 0.96 04/14/2023   BUN 18 04/14/2023   NA 142 04/14/2023   K 4.3 04/14/2023   CL 105 04/14/2023   CO2 25 04/14/2023   Lab Results  Component Value Date   ALT 8 04/14/2023   AST 10 04/14/2023   ALKPHOS 84 04/14/2023   BILITOT 0.3 04/14/2023   Lab Results  Component Value Date   CHOL 193 04/14/2023   HDL 53 04/14/2023   LDLCALC 125 (H) 04/14/2023   TRIG 83 04/14/2023   CHOLHDL 3.6 04/14/2023    Lab Results  Component Value Date   HGBA1C 6.8 (H) 11/13/2022    Assessment & Plan    1. CAD: S/p inferior STEMI, DES-RCA in 11/2022. Echocardiogram showed EF 60 to 65%, mild LVH, G1 DD. Stable with no anginal symptoms. No indication for ischemic evaluation. She is coming up on the 1 year anniversary of her stent placement in November 2025.  Will discuss with Dr. Wonda continuing DAPT indefinitely versus transitioning to monotherapy.  She is asking about the possibility of participating in cardiac rehab or maintenance cardiac rehab.  I will reach out to the nurse navigator for cardiac rehab to see if there are any options for participation at this time. Encouraged increase activity as tolerated.  Continue ASA, Effient , Olmesartan , carvedilol , amlodipine , Lipitor, and  Zetia .    ADDENDUM 10/25/2023: Per Dr. Wonda, okay to transition to aspirin  monotherapy 1 year post PCI.  2. Hypertension: BP well controlled. Continue current antihypertensive regimen.   3. Hyperlipidemia: LDL was 125 in 04/2023. She states that she was fasting for her recent lab work.  She was referred to lipid clinic Pharm.D. in the setting of ongoing elevated LDL despite high intensity statin therapy. She had her cholesterol checked today, results pending.  Follow-up with Pharm.D. for consideration of PCSK9 inhibitor/pending  lab results. Continue Lipitor, Zetia .   4. Type 2 diabetes: A1c was 6.3 in 01/2023, diet controlled. Encouraged increased activity as tolerated. She notes she previously took Ozempic, tolerated this well.  She is interested in possibly restarting Ozempic.  I encouraged her to discuss this with her endocrinologist.   5. Tobacco use: She continues to smoke. Full cessation advised.    6.  History of snoring/Sleep disordered breathing: Pending Itamar sleep study. She has the test at home, encouraged her to complete it.    7. Disposition: Follow-up in 6 months, sooner if needed.      Damien JAYSON Braver, NP 10/11/2023, 12:54 PM

## 2023-10-11 NOTE — Patient Instructions (Signed)
 Medication Instructions:  Your physician recommends that you continue on your current medications as directed. Please refer to the Current Medication list given to you today.  *If you need a refill on your cardiac medications before your next appointment, please call your pharmacy*  Lab Work: NONE ordered at this time of appointment   Testing/Procedures: NONE ordered at this time of appointment   Follow-Up: At [facility], you and your health needs are our priority.  As part of our continuing mission to provide you with exceptional heart care, our providers are all part of one team.  This team includes your primary Cardiologist (physician) and Advanced Practice Providers or APPs (Physician Assistants and Nurse Practitioners) who all work together to provide you with the care you need, when you need it.  Your next appointment:   6 month(s)  Provider:   Ozell Fell, MD    We recommend signing up for the patient portal called MyChart.  Sign up information is provided on this After Visit Summary.  MyChart is used to connect with patients for Virtual Visits (Telemedicine).  Patients are able to view lab/test results, encounter notes, upcoming appointments, etc.  Non-urgent messages can be sent to your provider as well.   To learn more about what you can do with MyChart, go to [email or web address].

## 2023-10-12 LAB — LIPID PANEL
Chol/HDL Ratio: 2.3 ratio (ref 0.0–4.4)
Cholesterol, Total: 130 mg/dL (ref 100–199)
HDL: 56 mg/dL (ref >39)
LDL Chol Calc (NIH): 55 mg/dL (ref 0–99)
Triglycerides: 106 mg/dL (ref 0–149)
VLDL Cholesterol Cal: 19 mg/dL (ref 5–40)

## 2023-10-12 LAB — HEPATIC FUNCTION PANEL
ALT: 72 IU/L — ABNORMAL HIGH (ref 0–32)
AST: 47 IU/L — ABNORMAL HIGH (ref 0–40)
Albumin: 4.1 g/dL (ref 3.9–4.9)
Alkaline Phosphatase: 529 IU/L — ABNORMAL HIGH (ref 49–135)
Bilirubin Total: 0.4 mg/dL (ref 0.0–1.2)
Bilirubin, Direct: 0.26 mg/dL (ref 0.00–0.40)
Total Protein: 7.1 g/dL (ref 6.0–8.5)

## 2023-10-13 ENCOUNTER — Ambulatory Visit: Payer: Self-pay | Admitting: Pharmacist

## 2023-10-13 ENCOUNTER — Encounter: Payer: Self-pay | Admitting: Pharmacist

## 2023-10-13 NOTE — Telephone Encounter (Signed)
 Error

## 2023-10-13 NOTE — Telephone Encounter (Signed)
 Lab result discussed over the phone. LDLc 55 goal <55 mg/dl however LFT elevated on statin+ Zetia . Advised patient hold on to both for now. Patient is late paying insurance so will be paying next Tues Oct 7,2025 insurance will be reinstated same day will assess coverage for [facility] Oct 8,2025

## 2023-10-14 ENCOUNTER — Telehealth (HOSPITAL_COMMUNITY): Payer: Self-pay

## 2023-10-14 NOTE — Telephone Encounter (Signed)
 Pt's chart was accessed due to pt being on a backlogged list of patients needing assistance with PCP placement.  Per chart review, pt has since been seen by PCP. No further action taken at this time.

## 2023-10-17 ENCOUNTER — Telehealth: Payer: Self-pay

## 2023-10-17 NOTE — Telephone Encounter (Signed)
 Ordering provider: Callie Goodrich APP Associated diagnoses: History of ST elevation myocardial infarction (STEMI) [I25.2] Essential hypertension [I10]   Patient NOT notified of PIN (1234) on 10/17/2023 via. Left patient a voicemail asking for them to return the call  Phone note routed to covering staff for follow-up.

## 2023-10-17 NOTE — Telephone Encounter (Signed)
 SABRA

## 2023-10-17 NOTE — Telephone Encounter (Signed)
 Cynthia Patton

## 2023-10-20 ENCOUNTER — Telehealth: Payer: Self-pay | Admitting: Pharmacy Technician

## 2023-10-20 ENCOUNTER — Other Ambulatory Visit (HOSPITAL_COMMUNITY): Payer: Self-pay

## 2023-10-20 NOTE — Telephone Encounter (Signed)
 Call to see if patient reactivated the insurance. N/A LVM.

## 2023-10-20 NOTE — Telephone Encounter (Signed)
 SABRA

## 2023-10-24 ENCOUNTER — Other Ambulatory Visit: Payer: Self-pay

## 2023-10-24 ENCOUNTER — Encounter (HOSPITAL_COMMUNITY): Payer: Self-pay

## 2023-10-24 ENCOUNTER — Emergency Department (HOSPITAL_COMMUNITY)
Admission: EM | Admit: 2023-10-24 | Discharge: 2023-10-24 | Discharge status: Against Medical Advice | Attending: Emergency Medicine | Admitting: Emergency Medicine

## 2023-10-24 DIAGNOSIS — H538 Other visual disturbances: Secondary | ICD-10-CM | POA: Diagnosis not present

## 2023-10-24 DIAGNOSIS — R799 Abnormal finding of blood chemistry, unspecified: Secondary | ICD-10-CM | POA: Diagnosis present

## 2023-10-24 DIAGNOSIS — Z5321 Procedure and treatment not carried out due to patient leaving prior to being seen by health care provider: Secondary | ICD-10-CM | POA: Insufficient documentation

## 2023-10-24 LAB — I-STAT CHEM 8, ED
BUN: 12 mg/dL (ref 8–23)
Calcium, Ion: 1.12 mmol/L — ABNORMAL LOW (ref 1.15–1.40)
Chloride: 104 mmol/L (ref 98–111)
Creatinine, Ser: 0.9 mg/dL (ref 0.44–1.00)
Glucose, Bld: 328 mg/dL — ABNORMAL HIGH (ref 70–99)
HCT: 39 % (ref 36.0–46.0)
Hemoglobin: 13.3 g/dL (ref 12.0–15.0)
Potassium: 4.1 mmol/L (ref 3.5–5.1)
Sodium: 138 mmol/L (ref 135–145)
TCO2: 22 mmol/L (ref 22–32)

## 2023-10-24 LAB — URINALYSIS, ROUTINE W REFLEX MICROSCOPIC
Bacteria, UA: NONE SEEN
Bilirubin Urine: NEGATIVE
Glucose, UA: >=500 mg/dL — AB
Ketones, ur: NEGATIVE mg/dL
Leukocytes,Ua: NEGATIVE
Nitrite: NEGATIVE
Protein, ur: NEGATIVE mg/dL
Specific Gravity, Urine: 1.006 (ref 1.005–1.030)
pH: 5 (ref 5.0–8.0)

## 2023-10-24 LAB — CBC
HCT: 39.4 % (ref 36.0–46.0)
Hemoglobin: 12.6 g/dL (ref 12.0–15.0)
MCH: 30.4 pg (ref 26.0–34.0)
MCHC: 32 g/dL (ref 30.0–36.0)
MCV: 95.2 fL (ref 80.0–100.0)
Platelets: 306 K/uL (ref 150–400)
RBC: 4.14 MIL/uL (ref 3.87–5.11)
RDW: 13.4 % (ref 11.5–15.5)
WBC: 7.3 K/uL (ref 4.0–10.5)
nRBC: 0 % (ref 0.0–0.2)

## 2023-10-24 NOTE — Telephone Encounter (Signed)
 The patient called to report that her blood glucose levels have been running high. She was previously on metformin  and Ozempic, but discontinued both medications after her glucose improved. Her endocrinologist is Dr. Donnice Havens at [facility]. She has attempted to contact both Dr. Havens and her primary care provider but has not received a response. She has some metformin  remaining and inquired about restarting it. After reviewing her history and current concerns, she was advised to restart metformin  1000 mg twice daily with food. Alternatively, she may begin with 500 mg twice daily with food and increase to 1000 mg twice daily over 1-2 days as tolerated.

## 2023-10-24 NOTE — ED Triage Notes (Signed)
 Pt states she has been taking carvedilol  and it has raised her HbA1c. Pt states she was taken off of ozempic last year when she had a heart attack and her blood sugar has been out of control, fsbs = 353. Pt attempted to get endocrinology appointment but was unable to make an appointment. Pt denies pain, states she has blurred vision which just started today.

## 2023-10-24 NOTE — ED Notes (Signed)
 After multiple conversations with this patient about the wait and trying to explain the process and the importance of her staying, she stated she could not wait any longer and was sao tome and [facility] go somewhere else. I tried to explain that she was getting closer but I could not give her a specific time and that if she left she would have to start over. PT elected to leave post triage from the WR.

## 2023-10-25 NOTE — Progress Notes (Signed)
Lmom, waiting on a return call.  

## 2023-10-26 ENCOUNTER — Other Ambulatory Visit (HOSPITAL_BASED_OUTPATIENT_CLINIC_OR_DEPARTMENT_OTHER): Payer: Self-pay

## 2023-10-26 ENCOUNTER — Other Ambulatory Visit (HOSPITAL_COMMUNITY): Payer: Self-pay

## 2023-10-26 ENCOUNTER — Other Ambulatory Visit: Payer: Self-pay

## 2023-10-26 ENCOUNTER — Emergency Department (HOSPITAL_BASED_OUTPATIENT_CLINIC_OR_DEPARTMENT_OTHER)
Admission: EM | Admit: 2023-10-26 | Discharge: 2023-10-26 | Disposition: A | Discharge status: Home / Self Care | Attending: Emergency Medicine | Admitting: Emergency Medicine

## 2023-10-26 ENCOUNTER — Encounter (HOSPITAL_COMMUNITY): Payer: Self-pay

## 2023-10-26 DIAGNOSIS — I10 Essential (primary) hypertension: Secondary | ICD-10-CM | POA: Insufficient documentation

## 2023-10-26 DIAGNOSIS — I2510 Atherosclerotic heart disease of native coronary artery without angina pectoris: Secondary | ICD-10-CM | POA: Insufficient documentation

## 2023-10-26 DIAGNOSIS — Z7984 Long term (current) use of oral hypoglycemic drugs: Secondary | ICD-10-CM | POA: Insufficient documentation

## 2023-10-26 DIAGNOSIS — Z7982 Long term (current) use of aspirin: Secondary | ICD-10-CM | POA: Insufficient documentation

## 2023-10-26 DIAGNOSIS — E1165 Type 2 diabetes mellitus with hyperglycemia: Secondary | ICD-10-CM | POA: Insufficient documentation

## 2023-10-26 DIAGNOSIS — E1169 Type 2 diabetes mellitus with other specified complication: Secondary | ICD-10-CM

## 2023-10-26 DIAGNOSIS — Z79899 Other long term (current) drug therapy: Secondary | ICD-10-CM | POA: Insufficient documentation

## 2023-10-26 DIAGNOSIS — R739 Hyperglycemia, unspecified: Secondary | ICD-10-CM | POA: Diagnosis present

## 2023-10-26 LAB — COMPREHENSIVE METABOLIC PANEL WITH GFR
ALT: 23 U/L (ref 0–44)
AST: 19 U/L (ref 15–41)
Albumin: 4.2 g/dL (ref 3.5–5.0)
Alkaline Phosphatase: 238 U/L — ABNORMAL HIGH (ref 38–126)
Anion gap: 11 (ref 5–15)
BUN: 11 mg/dL (ref 8–23)
CO2: 23 mmol/L (ref 22–32)
Calcium: 9.2 mg/dL (ref 8.9–10.3)
Chloride: 102 mmol/L (ref 98–111)
Creatinine, Ser: 0.81 mg/dL (ref 0.44–1.00)
GFR, Estimated: >60 mL/min (ref >60)
Glucose, Bld: 293 mg/dL — ABNORMAL HIGH (ref 70–99)
Potassium: 3.8 mmol/L (ref 3.5–5.1)
Sodium: 137 mmol/L (ref 135–145)
Total Bilirubin: 0.7 mg/dL (ref 0.0–1.2)
Total Protein: 7.5 g/dL (ref 6.5–8.1)

## 2023-10-26 LAB — URINALYSIS, ROUTINE W REFLEX MICROSCOPIC
Bilirubin Urine: NEGATIVE
Glucose, UA: >1000 mg/dL — AB
Leukocytes,Ua: NEGATIVE
Nitrite: NEGATIVE
Specific Gravity, Urine: 1.013 (ref 1.005–1.030)
pH: 5.5 (ref 5.0–8.0)

## 2023-10-26 LAB — CBG MONITORING, ED: Glucose-Capillary: 287 mg/dL — ABNORMAL HIGH (ref 70–99)

## 2023-10-26 MED ORDER — SEMAGLUTIDE(0.25 OR 0.5MG/DOS) 2 MG/3ML ~~LOC~~ SOPN
0.2500 mg | PEN_INJECTOR | SUBCUTANEOUS | 2 refills | Status: AC
  Filled 2023-10-26: qty 3, 28d supply, fill #0

## 2023-10-26 MED ORDER — OZEMPIC (0.25 OR 0.5 MG/DOSE) 2 MG/3ML ~~LOC~~ SOPN
0.5000 mg | PEN_INJECTOR | SUBCUTANEOUS | 2 refills | Status: DC
  Filled 2023-10-26 – 2023-12-27 (×2): qty 3, 28d supply, fill #0

## 2023-10-26 MED ORDER — METFORMIN HCL ER 500 MG PO TB24
500.0000 mg | ORAL_TABLET | Freq: Every day | ORAL | 2 refills | Status: AC
  Filled 2023-10-26 – 2023-11-09 (×2): qty 30, 30d supply, fill #0

## 2023-10-26 NOTE — Discharge Instructions (Signed)
 You were seen for your elevated blood sugars in the emergency department.   At home, please resume your Ozempic.  You may continue the metformin  while taking this as well until your blood sugars are improved.  Check your MyChart online for the results of any tests that had not resulted by the time you left the emergency department.   Follow-up with your primary doctor in 2-3 days regarding your visit.  Talk to your cardiologist about your Coreg .  Return immediately to the emergency department if you experience any of the following: Vomiting, persistently elevated blood sugars, difficulty breathing, or any other concerning symptoms.    Thank you for visiting our Emergency Department. It was a pleasure taking care of you today.

## 2023-10-26 NOTE — ED Provider Notes (Signed)
 [place] EMERGENCY DEPARTMENT AT [facility] Provider Note   CSN: [id number] Arrival date & time: 10/26/23  9152     Patient presents with: Hyperglycemia   Cynthia Patton is a 63 y.o. female.   62 year old female with history of diabetes, hypertension, and CAD who presents emergency department with elevated blood sugar.  Patient reports that recently her blood sugars been elevated.  Up to 373 at home.  Was previously on Ozempic but her blood sugars came under control so she was able to stop it.  Has been taking it 1000 mg of metformin  per day since then.  Increased urination but no increased thirst or shortness of breath.  A month and a half ago had the flu and has not had a lingering cough since then but no fevers or chills or shortness of breath.  Thinks that her elevated blood sugar could be due to her Coreg  and so self discontinued this recently       Prior to Admission medications   Medication Sig Start Date End Date Taking? Authorizing Provider  metFORMIN  (GLUCOPHAGE -XR) 500 MG 24 hr tablet Take 1 tablet (500 mg total) by mouth daily with breakfast. 10/26/23  Yes Yolande Lamar BROCKS, MD  Semaglutide,0.25 or 0.5MG /DOS, 2 MG/3ML SOPN Inject 0.25 mg into the skin once a week. 10/26/23  Yes Yolande Lamar BROCKS, MD  amLODipine  (NORVASC ) 5 MG tablet Take 1 tablet (5 mg total) by mouth daily. 08/29/23   Wonda Sharper, MD  aspirin  81 MG chewable tablet Chew 1 tablet (81 mg total) by mouth daily. 12/01/22   Goodrich, Callie E, PA-C  atorvastatin  (LIPITOR) 80 MG tablet Take 1 tablet (80 mg total) by mouth daily. 07/19/23   Wonda Sharper, MD  blood glucose meter kit and supplies KIT Dispense based on patient and insurance preference. Use up to four times daily as directed. (FOR ICD-9 250.00, 250.01). 07/07/18   Vicenta Maduro, FNP  carvedilol  (COREG ) 12.5 MG tablet Take 1 tablet (12.5 mg total) by mouth 2 (two) times daily. 10/11/23   Daneen Damien BROCKS, NP  ezetimibe  (ZETIA ) 10 MG  tablet Take 1 tablet (10 mg total) by mouth daily. 10/11/23 01/09/24  Daneen Damien BROCKS, NP  fluticasone  (FLONASE ) 50 MCG/ACT nasal spray Place 2 sprays into both nostrils daily. Patient taking differently: Place 2 sprays into both nostrils as needed. 04/09/21   Nichols, Tonya S, NP  levothyroxine  (SYNTHROID ) 175 MCG tablet Take 175 mcg by mouth daily before breakfast. 01/14/20   [provider]  mirtazapine  (REMERON ) 7.5 MG tablet TAKE 1 TABLET BY MOUTH AT BEDTIME 08/10/23   Tanda Bleacher, MD  nitroGLYCERIN  (NITROSTAT ) 0.4 MG SL tablet Place 1 tablet (0.4 mg total) under the tongue every 5 (five) minutes as needed for chest pain. 11/14/22   Goodrich, Callie E, PA-C  olmesartan  (BENICAR ) 40 MG tablet Take 1 tablet (40 mg total) by mouth daily. 10/11/23   Daneen Damien BROCKS, NP  ONETOUCH VERIO test strip  10/22/18   [provider]  prasugrel  (EFFIENT ) 10 MG TABS tablet Take 1 tablet (10 mg total) by mouth daily. Patient not taking: Reported on 10/11/2023 07/18/23   Wonda Sharper, MD  prasugrel  (EFFIENT ) 10 MG TABS tablet Take 1 tablet (10 mg total) by mouth daily. 10/11/23   Daneen Damien BROCKS, NP    Allergies: Floxin [ocuflox], Ofloxacin, Oxycodone -acetaminophen , and Percocet [oxycodone -acetaminophen ]    Review of Systems  Updated Vital Signs BP (!) 153/67   Pulse 78   Temp 98.4 F (36.9  C) (Oral)   Resp 16   SpO2 100%   Physical Exam Vitals and nursing note reviewed.  Constitutional:      General: She is not in acute distress.    Appearance: She is well-developed.  HENT:     Head: Normocephalic and atraumatic.     Right Ear: External ear normal.     Left Ear: External ear normal.     Nose: Nose normal.     Mouth/Throat:     Mouth: Mucous membranes are moist.  Eyes:     Extraocular Movements: Extraocular movements intact.     Conjunctiva/sclera: Conjunctivae normal.     Pupils: Pupils are equal, round, and reactive to light.  Cardiovascular:     Rate and Rhythm: Normal rate  and regular rhythm.     Heart sounds: No murmur heard. Pulmonary:     Effort: Pulmonary effort is normal. No respiratory distress.     Breath sounds: Normal breath sounds.  Musculoskeletal:     Cervical back: Normal range of motion and neck supple.  Skin:    General: Skin is warm and dry.  Neurological:     Mental Status: She is alert and oriented to person, place, and time. Mental status is at baseline.  Psychiatric:        Mood and Affect: Mood normal.     (all labs ordered are listed, but only abnormal results are displayed) Labs Reviewed  URINALYSIS, ROUTINE W REFLEX MICROSCOPIC - Abnormal; Notable for the following components:      Result Value   Glucose, UA >1,000 (*)    Hgb urine dipstick SMALL (*)    Ketones, ur TRACE (*)    Protein, ur TRACE (*)    Bacteria, UA RARE (*)    All other components within normal limits  COMPREHENSIVE METABOLIC PANEL WITH GFR - Abnormal; Notable for the following components:   Glucose, Bld 293 (*)    Alkaline Phosphatase 238 (*)    All other components within normal limits  CBG MONITORING, ED - Abnormal; Notable for the following components:   Glucose-Capillary 287 (*)    All other components within normal limits    EKG: None  Radiology: No results found.   Procedures   Medications Ordered in the ED - No data to display                                  Medical Decision Making Amount and/or Complexity of Data Reviewed Labs: ordered.  Risk Prescription drug management.   63 year old female with a history of diabetes, hypertension, and CAD presents emergency department with elevated blood sugars  Initial Ddx:  DKA, hyperglycemia, HHS, influenza, chronic cough, pneumonia  MDM/Course:  Patient presents emergency department with elevated blood sugars.  Was previously on Ozempic.  Has been taking some expired metformin  at home for this currently.  Did have labs recently when she came to the emergency department left prior to  be being seen.  These are not consistent with DKA.  She is overall well-appearing on exam right now.  Labs today not consistent with DKA either.  Renal function liver function is normal.  She is wanting to start Ozempic so I talked to our pharmacist about this.  It was sent to the pharmacy.  Will also have her continue her metformin  in the short-term since she likely will require a gradual increase in her dose of Ozempic.  Terms  of her cough I do not hear any abnormal breath sounds she is not septic or having any infectious symptoms so low concern for pneumonia.  Suspect this may be from her getting over the flu.  Will have her follow-up with her primary doctor about this as well as her blood sugar.  Also counseled her to try to follow-up with her cardiologist because she is concerned about her Coreg  increasing her blood sugars but I did state that she will need to be on a beta-blocker with her history of a heart attack and that all beta-blockers have a theoretical risk of increasing blood sugars.  This patient presents to the ED for concern of complaints listed in HPI, this involves an extensive number of treatment options, and is a complaint that carries with it a high risk of complications and morbidity. Disposition including potential need for admission considered.   Dispo: DC Home. Return precautions discussed including, but not limited to, those listed in the AVS. Allowed pt time to ask questions which were answered fully prior to dc.  Records reviewed ED Visit Notes The following labs were independently interpreted: Chemistry and show Hyperglycemia I have reviewed the patients home medications and made adjustments as needed  Portions of this note were generated with Dragon dictation software. Dictation errors may occur despite best attempts at proofreading.     Final diagnoses:  Type 2 diabetes mellitus with other specified complication, without long-term current use of insulin [facility])    ED  Discharge Orders          Ordered    Semaglutide,0.25 or 0.5MG /DOS, 2 MG/3ML SOPN  Weekly        10/26/23 0950    metFORMIN  (GLUCOPHAGE -XR) 500 MG 24 hr tablet  Daily with breakfast        10/26/23 1003               Yolande Lamar BROCKS, MD 10/26/23 1008

## 2023-10-26 NOTE — ED Triage Notes (Signed)
 Pt caox4 ambulatory NAD c/o hyperglycemia x1 wk reporting she has tried to see her endocrinologist but has been unable to get in. Went to [facility] 2 days ago for blurry vision but LWBS d/t wait. Was taken off Ozempic approx 6 mo ago after BG were better. Started taking Metformin  she had at home last Fri. In attempts to improve her BG. CBG currently 287. Also states she had the flu approx 1 mo ago, still has productive cough at present.

## 2023-10-28 ENCOUNTER — Other Ambulatory Visit (HOSPITAL_BASED_OUTPATIENT_CLINIC_OR_DEPARTMENT_OTHER): Payer: Self-pay

## 2023-11-07 ENCOUNTER — Other Ambulatory Visit (HOSPITAL_BASED_OUTPATIENT_CLINIC_OR_DEPARTMENT_OTHER): Payer: Self-pay

## 2023-11-07 NOTE — Progress Notes (Signed)
Lmom, waiting on a return call.  

## 2023-11-09 ENCOUNTER — Other Ambulatory Visit: Payer: Self-pay | Admitting: Family Medicine

## 2023-11-09 ENCOUNTER — Other Ambulatory Visit (HOSPITAL_BASED_OUTPATIENT_CLINIC_OR_DEPARTMENT_OTHER): Payer: Self-pay

## 2023-11-09 ENCOUNTER — Other Ambulatory Visit: Payer: Self-pay

## 2023-11-10 ENCOUNTER — Other Ambulatory Visit: Payer: Self-pay

## 2023-11-14 NOTE — Progress Notes (Signed)
 Letter mailed to pt after several attempts of reaching pt with no return call.

## 2023-11-15 ENCOUNTER — Other Ambulatory Visit: Payer: Self-pay

## 2023-11-15 MED ORDER — LEVOTHYROXINE SODIUM 175 MCG PO TABS
175.0000 ug | ORAL_TABLET | Freq: Every day | ORAL | 5 refills | Status: AC
  Filled 2023-11-15 – 2023-12-27 (×3): qty 30, 30d supply, fill #0
  Filled 2023-12-27: qty 30, 30d supply, fill #1

## 2023-11-18 NOTE — Telephone Encounter (Signed)
 Call to f/u on insurance- N/A LVM to call back

## 2023-11-23 ENCOUNTER — Other Ambulatory Visit: Payer: Self-pay

## 2023-11-24 ENCOUNTER — Other Ambulatory Visit: Payer: Self-pay

## 2023-11-29 NOTE — Telephone Encounter (Signed)
 Called pt to see if sleep study has been completed. Left vm

## 2023-12-02 ENCOUNTER — Other Ambulatory Visit: Payer: Self-pay

## 2023-12-27 ENCOUNTER — Other Ambulatory Visit (HOSPITAL_COMMUNITY): Payer: Self-pay

## 2024-01-02 ENCOUNTER — Other Ambulatory Visit (HOSPITAL_COMMUNITY): Payer: Self-pay

## 2024-01-03 ENCOUNTER — Other Ambulatory Visit (HOSPITAL_COMMUNITY): Payer: Self-pay

## 2024-01-03 ENCOUNTER — Other Ambulatory Visit: Payer: Self-pay

## 2024-01-23 ENCOUNTER — Encounter (HOSPITAL_COMMUNITY): Payer: Self-pay

## 2024-01-23 ENCOUNTER — Other Ambulatory Visit (HOSPITAL_COMMUNITY): Payer: Self-pay

## 2024-01-23 MED ORDER — ACCU-CHEK GUIDE ME W/DEVICE KIT
PACK | Freq: Two times a day (BID) | 0 refills | Status: AC
  Filled 2024-01-23: qty 1, 30d supply, fill #0

## 2024-01-23 MED ORDER — OZEMPIC (0.25 OR 0.5 MG/DOSE) 2 MG/3ML ~~LOC~~ SOPN
PEN_INJECTOR | SUBCUTANEOUS | 11 refills | Status: AC
  Filled 2024-01-23: qty 3, 42d supply, fill #0
